# Patient Record
Sex: Male | Born: 1945
Health system: Southern US, Community
[De-identification: ages and names within clinical notes are randomized; demographics above are authoritative.]

## PROBLEM LIST (undated history)

## (undated) DIAGNOSIS — G4733 Obstructive sleep apnea (adult) (pediatric): Secondary | ICD-10-CM

## (undated) DIAGNOSIS — C801 Malignant (primary) neoplasm, unspecified: Secondary | ICD-10-CM

## (undated) DIAGNOSIS — K605 Anorectal fistula, unspecified: Secondary | ICD-10-CM

## (undated) DIAGNOSIS — M1990 Unspecified osteoarthritis, unspecified site: Secondary | ICD-10-CM

## (undated) DIAGNOSIS — Z5189 Encounter for other specified aftercare: Secondary | ICD-10-CM

## (undated) DIAGNOSIS — Z8572 Personal history of non-Hodgkin lymphomas: Secondary | ICD-10-CM

## (undated) DIAGNOSIS — Z8579 Personal history of other malignant neoplasms of lymphoid, hematopoietic and related tissues: Secondary | ICD-10-CM

## (undated) DIAGNOSIS — Z86711 Personal history of pulmonary embolism: Secondary | ICD-10-CM

## (undated) DIAGNOSIS — I8393 Asymptomatic varicose veins of bilateral lower extremities: Secondary | ICD-10-CM

## (undated) DIAGNOSIS — L0390 Cellulitis, unspecified: Secondary | ICD-10-CM

## (undated) DIAGNOSIS — Z9989 Dependence on other enabling machines and devices: Secondary | ICD-10-CM

## (undated) DIAGNOSIS — H269 Unspecified cataract: Secondary | ICD-10-CM

## (undated) DIAGNOSIS — K432 Incisional hernia without obstruction or gangrene: Secondary | ICD-10-CM

## (undated) DIAGNOSIS — I82409 Acute embolism and thrombosis of unspecified deep veins of unspecified lower extremity: Secondary | ICD-10-CM

## (undated) HISTORY — DX: Morbid (severe) obesity due to excess calories: E66.01

## (undated) HISTORY — DX: Personal history of non-Hodgkin lymphomas: Z85.72

## (undated) HISTORY — PX: PROSTATE SURGERY: SHX751

## (undated) HISTORY — DX: Incisional hernia without obstruction or gangrene: K43.2

## (undated) HISTORY — PX: TONSILLECTOMY: SUR1361

## (undated) HISTORY — DX: Asymptomatic varicose veins of bilateral lower extremities: I83.93

## (undated) HISTORY — DX: Obstructive sleep apnea (adult) (pediatric): G47.33

## (undated) HISTORY — DX: Dependence on other enabling machines and devices: Z99.89

## (undated) HISTORY — DX: Unspecified osteoarthritis, unspecified site: M19.90

## (undated) HISTORY — DX: Unspecified cataract: H26.9

## (undated) HISTORY — PX: FRACTURE SURGERY: SHX138

## (undated) HISTORY — DX: Encounter for other specified aftercare: Z51.89

## (undated) HISTORY — DX: Personal history of other malignant neoplasms of lymphoid, hematopoietic and related tissues: Z85.79

## (undated) HISTORY — DX: Personal history of pulmonary embolism: Z86.711

## (undated) HISTORY — PX: SPLENECTOMY: SUR1306

## (undated) HISTORY — PX: HERNIA REPAIR: SHX51

## (undated) HISTORY — PX: OTHER SURGICAL HISTORY: SHX169

---

## 1997-10-05 ENCOUNTER — Ambulatory Visit: Admission: RE | Admit: 1997-10-05 | Discharge: 1997-10-05 | Payer: Self-pay | Admitting: Emergency Medicine

## 1998-02-01 ENCOUNTER — Ambulatory Visit: Admission: RE | Admit: 1998-02-01 | Discharge: 1998-02-01 | Payer: Self-pay | Admitting: Internal Medicine

## 1999-02-20 ENCOUNTER — Encounter: Payer: Self-pay | Admitting: Cardiovascular Disease

## 1999-02-20 ENCOUNTER — Inpatient Hospital Stay (HOSPITAL_COMMUNITY): Admission: AD | Admit: 1999-02-20 | Discharge: 1999-02-27 | Payer: Self-pay | Admitting: Cardiovascular Disease

## 1999-02-21 ENCOUNTER — Encounter: Payer: Self-pay | Admitting: *Deleted

## 1999-02-21 ENCOUNTER — Encounter: Payer: Self-pay | Admitting: Cardiovascular Disease

## 1999-03-29 ENCOUNTER — Encounter: Payer: Self-pay | Admitting: Cardiovascular Disease

## 1999-03-29 ENCOUNTER — Ambulatory Visit (HOSPITAL_COMMUNITY): Admission: RE | Admit: 1999-03-29 | Discharge: 1999-03-29 | Payer: Self-pay | Admitting: Cardiovascular Disease

## 2000-10-02 ENCOUNTER — Encounter: Admission: RE | Admit: 2000-10-02 | Discharge: 2000-12-31 | Payer: Self-pay | Admitting: Emergency Medicine

## 2000-12-02 ENCOUNTER — Encounter: Payer: Self-pay | Admitting: Emergency Medicine

## 2000-12-02 ENCOUNTER — Observation Stay (HOSPITAL_COMMUNITY): Admission: AC | Admit: 2000-12-02 | Discharge: 2000-12-03 | Payer: Self-pay

## 2001-02-18 ENCOUNTER — Inpatient Hospital Stay (HOSPITAL_COMMUNITY): Admission: EM | Admit: 2001-02-18 | Discharge: 2001-02-25 | Payer: Self-pay | Admitting: Emergency Medicine

## 2001-02-18 ENCOUNTER — Encounter: Payer: Self-pay | Admitting: Emergency Medicine

## 2001-05-05 HISTORY — PX: EYE SURGERY: SHX253

## 2001-11-02 ENCOUNTER — Encounter: Payer: Self-pay | Admitting: Ophthalmology

## 2001-11-02 ENCOUNTER — Observation Stay (HOSPITAL_COMMUNITY): Admission: AD | Admit: 2001-11-02 | Discharge: 2001-11-04 | Payer: Self-pay | Admitting: Ophthalmology

## 2002-05-31 ENCOUNTER — Ambulatory Visit (HOSPITAL_COMMUNITY): Admission: RE | Admit: 2002-05-31 | Discharge: 2002-05-31 | Payer: Self-pay | Admitting: Ophthalmology

## 2002-07-22 ENCOUNTER — Encounter (INDEPENDENT_AMBULATORY_CARE_PROVIDER_SITE_OTHER): Payer: Self-pay | Admitting: *Deleted

## 2002-07-22 ENCOUNTER — Ambulatory Visit (HOSPITAL_COMMUNITY): Admission: RE | Admit: 2002-07-22 | Discharge: 2002-07-22 | Payer: Self-pay | Admitting: *Deleted

## 2004-07-16 ENCOUNTER — Ambulatory Visit: Payer: Self-pay | Admitting: Internal Medicine

## 2004-08-22 ENCOUNTER — Encounter: Admission: RE | Admit: 2004-08-22 | Discharge: 2004-08-22 | Payer: Self-pay | Admitting: General Surgery

## 2004-10-25 ENCOUNTER — Encounter (INDEPENDENT_AMBULATORY_CARE_PROVIDER_SITE_OTHER): Payer: Self-pay | Admitting: Specialist

## 2004-10-25 ENCOUNTER — Ambulatory Visit (HOSPITAL_COMMUNITY): Admission: RE | Admit: 2004-10-25 | Discharge: 2004-10-25 | Payer: Self-pay | Admitting: General Surgery

## 2006-06-20 ENCOUNTER — Emergency Department (HOSPITAL_COMMUNITY): Admission: EM | Admit: 2006-06-20 | Discharge: 2006-06-21 | Payer: Self-pay | Admitting: Emergency Medicine

## 2007-01-25 ENCOUNTER — Encounter: Payer: Self-pay | Admitting: Internal Medicine

## 2007-01-25 LAB — CONVERTED CEMR LAB
INR: 2.4 — ABNORMAL HIGH (ref 0.0–1.5)
Prothrombin Time: 27.3 s — ABNORMAL HIGH (ref 11.6–15.2)

## 2007-03-26 ENCOUNTER — Ambulatory Visit: Payer: Self-pay | Admitting: Internal Medicine

## 2007-03-29 ENCOUNTER — Ambulatory Visit: Payer: Self-pay | Admitting: Internal Medicine

## 2007-03-29 LAB — CONVERTED CEMR LAB
INR: 3.3 — ABNORMAL HIGH (ref 0.8–1.0)
Prothrombin Time: 22.9 s — ABNORMAL HIGH (ref 10.9–13.3)

## 2007-04-26 ENCOUNTER — Ambulatory Visit: Payer: Self-pay | Admitting: Cardiology

## 2007-05-12 ENCOUNTER — Ambulatory Visit: Payer: Self-pay | Admitting: Cardiovascular Disease

## 2007-07-07 ENCOUNTER — Ambulatory Visit: Payer: Self-pay | Admitting: Internal Medicine

## 2007-08-04 ENCOUNTER — Ambulatory Visit: Payer: Self-pay | Admitting: Cardiology

## 2007-08-17 ENCOUNTER — Ambulatory Visit: Payer: Self-pay | Admitting: Cardiology

## 2007-12-28 ENCOUNTER — Ambulatory Visit: Payer: Self-pay | Admitting: Cardiology

## 2008-01-18 ENCOUNTER — Ambulatory Visit: Payer: Self-pay | Admitting: Cardiology

## 2008-02-08 ENCOUNTER — Encounter: Payer: Self-pay | Admitting: Internal Medicine

## 2008-02-15 ENCOUNTER — Ambulatory Visit: Payer: Self-pay | Admitting: Cardiovascular Disease

## 2008-03-14 ENCOUNTER — Ambulatory Visit: Payer: Self-pay | Admitting: Cardiology

## 2008-04-11 ENCOUNTER — Ambulatory Visit: Payer: Self-pay | Admitting: Internal Medicine

## 2008-04-19 DIAGNOSIS — Z86718 Personal history of other venous thrombosis and embolism: Secondary | ICD-10-CM | POA: Insufficient documentation

## 2008-04-19 DIAGNOSIS — G4733 Obstructive sleep apnea (adult) (pediatric): Secondary | ICD-10-CM | POA: Insufficient documentation

## 2008-04-19 DIAGNOSIS — I8390 Asymptomatic varicose veins of unspecified lower extremity: Secondary | ICD-10-CM | POA: Insufficient documentation

## 2008-04-20 ENCOUNTER — Ambulatory Visit: Payer: Self-pay | Admitting: Internal Medicine

## 2008-05-09 ENCOUNTER — Ambulatory Visit: Payer: Self-pay | Admitting: Cardiology

## 2008-06-06 ENCOUNTER — Ambulatory Visit: Payer: Self-pay | Admitting: Cardiology

## 2008-06-28 ENCOUNTER — Ambulatory Visit: Payer: Self-pay | Admitting: Cardiology

## 2008-07-27 ENCOUNTER — Ambulatory Visit: Payer: Self-pay | Admitting: Internal Medicine

## 2008-08-30 ENCOUNTER — Telehealth (INDEPENDENT_AMBULATORY_CARE_PROVIDER_SITE_OTHER): Payer: Self-pay | Admitting: Cardiology

## 2008-09-15 ENCOUNTER — Ambulatory Visit: Payer: Self-pay | Admitting: Cardiology

## 2008-10-03 ENCOUNTER — Encounter: Payer: Self-pay | Admitting: *Deleted

## 2008-10-03 ENCOUNTER — Ambulatory Visit: Payer: Self-pay | Admitting: Cardiovascular Disease

## 2008-10-03 LAB — CONVERTED CEMR LAB
POC INR: 1.3
Protime: 14.2

## 2008-10-23 ENCOUNTER — Ambulatory Visit: Payer: Self-pay | Admitting: Internal Medicine

## 2008-10-23 ENCOUNTER — Encounter (INDEPENDENT_AMBULATORY_CARE_PROVIDER_SITE_OTHER): Payer: Self-pay | Admitting: Cardiology

## 2008-10-23 LAB — CONVERTED CEMR LAB
POC INR: 1.9
Protime: 17

## 2008-11-08 ENCOUNTER — Encounter: Payer: Self-pay | Admitting: *Deleted

## 2008-11-13 ENCOUNTER — Encounter (INDEPENDENT_AMBULATORY_CARE_PROVIDER_SITE_OTHER): Payer: Self-pay | Admitting: Cardiology

## 2008-11-13 ENCOUNTER — Ambulatory Visit: Payer: Self-pay | Admitting: Cardiovascular Disease

## 2008-11-13 LAB — CONVERTED CEMR LAB
POC INR: 2.1
Prothrombin Time: 17.9 s

## 2009-01-01 ENCOUNTER — Encounter (INDEPENDENT_AMBULATORY_CARE_PROVIDER_SITE_OTHER): Payer: Self-pay | Admitting: Cardiology

## 2009-01-19 ENCOUNTER — Encounter (INDEPENDENT_AMBULATORY_CARE_PROVIDER_SITE_OTHER): Payer: Self-pay | Admitting: *Deleted

## 2009-02-07 ENCOUNTER — Ambulatory Visit: Payer: Self-pay | Admitting: Cardiology

## 2009-02-07 LAB — CONVERTED CEMR LAB: POC INR: 2.9

## 2009-03-07 ENCOUNTER — Ambulatory Visit: Payer: Self-pay | Admitting: Cardiovascular Disease

## 2009-03-07 LAB — CONVERTED CEMR LAB: POC INR: 3.8

## 2009-03-20 ENCOUNTER — Encounter: Payer: Self-pay | Admitting: Internal Medicine

## 2009-03-23 ENCOUNTER — Ambulatory Visit: Payer: Self-pay | Admitting: Cardiology

## 2009-03-23 LAB — CONVERTED CEMR LAB: POC INR: 3.8

## 2009-04-04 ENCOUNTER — Ambulatory Visit: Payer: Self-pay | Admitting: Cardiology

## 2009-04-04 LAB — CONVERTED CEMR LAB: POC INR: 2.4

## 2009-04-16 ENCOUNTER — Ambulatory Visit: Payer: Self-pay | Admitting: Internal Medicine

## 2009-04-25 ENCOUNTER — Ambulatory Visit: Payer: Self-pay | Admitting: Cardiology

## 2009-04-25 LAB — CONVERTED CEMR LAB: POC INR: 2.4

## 2009-05-14 ENCOUNTER — Telehealth: Payer: Self-pay | Admitting: Internal Medicine

## 2009-05-23 ENCOUNTER — Ambulatory Visit: Payer: Self-pay | Admitting: Cardiology

## 2009-05-23 LAB — CONVERTED CEMR LAB: POC INR: 2.7

## 2009-08-12 ENCOUNTER — Emergency Department (HOSPITAL_COMMUNITY): Admission: EM | Admit: 2009-08-12 | Discharge: 2009-08-12 | Payer: Self-pay | Admitting: Emergency Medicine

## 2009-09-11 ENCOUNTER — Encounter: Admission: RE | Admit: 2009-09-11 | Discharge: 2009-09-11 | Payer: Self-pay | Admitting: Emergency Medicine

## 2009-09-11 ENCOUNTER — Inpatient Hospital Stay (HOSPITAL_COMMUNITY): Admission: EM | Admit: 2009-09-11 | Discharge: 2009-09-13 | Payer: Self-pay | Admitting: Emergency Medicine

## 2009-09-21 ENCOUNTER — Encounter: Payer: Self-pay | Admitting: Cardiology

## 2009-12-07 ENCOUNTER — Encounter: Admission: RE | Admit: 2009-12-07 | Discharge: 2009-12-07 | Payer: Self-pay | Admitting: Emergency Medicine

## 2009-12-12 ENCOUNTER — Telehealth: Payer: Self-pay | Admitting: Internal Medicine

## 2009-12-26 ENCOUNTER — Inpatient Hospital Stay (HOSPITAL_COMMUNITY): Admission: AD | Admit: 2009-12-26 | Discharge: 2010-01-01 | Payer: Self-pay

## 2009-12-26 ENCOUNTER — Encounter: Admission: RE | Admit: 2009-12-26 | Discharge: 2009-12-26 | Payer: Self-pay | Admitting: Emergency Medicine

## 2009-12-28 ENCOUNTER — Encounter (INDEPENDENT_AMBULATORY_CARE_PROVIDER_SITE_OTHER): Payer: Self-pay | Admitting: Surgery

## 2010-01-04 ENCOUNTER — Ambulatory Visit: Payer: Self-pay | Admitting: Cardiology

## 2010-01-04 ENCOUNTER — Ambulatory Visit: Payer: Self-pay | Admitting: Oncology

## 2010-01-04 LAB — CONVERTED CEMR LAB: POC INR: 2

## 2010-01-08 LAB — CBC WITH DIFFERENTIAL/PLATELET
BASO%: 0.3 % (ref 0.0–2.0)
Basophils Absolute: 0 10*3/uL (ref 0.0–0.1)
EOS%: 4.4 % (ref 0.0–7.0)
Eosinophils Absolute: 0.5 10*3/uL (ref 0.0–0.5)
HCT: 35.2 % — ABNORMAL LOW (ref 38.4–49.9)
HGB: 11.6 g/dL — ABNORMAL LOW (ref 13.0–17.1)
LYMPH%: 7.9 % — ABNORMAL LOW (ref 14.0–49.0)
MCH: 27.3 pg (ref 27.2–33.4)
MCHC: 33 g/dL (ref 32.0–36.0)
MCV: 82.7 fL (ref 79.3–98.0)
MONO#: 1.1 10*3/uL — ABNORMAL HIGH (ref 0.1–0.9)
MONO%: 11 % (ref 0.0–14.0)
NEUT#: 7.9 10*3/uL — ABNORMAL HIGH (ref 1.5–6.5)
NEUT%: 76.4 % — ABNORMAL HIGH (ref 39.0–75.0)
Platelets: 778 10*3/uL — ABNORMAL HIGH (ref 140–400)
RBC: 4.26 10*6/uL (ref 4.20–5.82)
RDW: 16.6 % — ABNORMAL HIGH (ref 11.0–14.6)
WBC: 10.3 10*3/uL (ref 4.0–10.3)
lymph#: 0.8 10*3/uL — ABNORMAL LOW (ref 0.9–3.3)

## 2010-01-08 LAB — COMPREHENSIVE METABOLIC PANEL
ALT: 18 U/L (ref 0–53)
AST: 19 U/L (ref 0–37)
Albumin: 3.6 g/dL (ref 3.5–5.2)
Alkaline Phosphatase: 80 U/L (ref 39–117)
BUN: 11 mg/dL (ref 6–23)
CO2: 28 mEq/L (ref 19–32)
Calcium: 8.9 mg/dL (ref 8.4–10.5)
Chloride: 101 mEq/L (ref 96–112)
Creatinine, Ser: 0.64 mg/dL (ref 0.40–1.50)
Glucose, Bld: 86 mg/dL (ref 70–99)
Potassium: 4.1 mEq/L (ref 3.5–5.3)
Sodium: 136 mEq/L (ref 135–145)
Total Bilirubin: 0.5 mg/dL (ref 0.3–1.2)
Total Protein: 7 g/dL (ref 6.0–8.3)

## 2010-01-08 LAB — MORPHOLOGY
PLT EST: INCREASED
White Cell Comments: 1

## 2010-01-08 LAB — LACTATE DEHYDROGENASE: LDH: 136 U/L (ref 94–250)

## 2010-01-08 LAB — URIC ACID: Uric Acid, Serum: 4.9 mg/dL (ref 4.0–7.8)

## 2010-01-08 LAB — CHCC SMEAR

## 2010-01-10 LAB — HEPATITIS B SURFACE ANTIGEN: Hepatitis B Surface Ag: NEGATIVE

## 2010-01-10 LAB — IMMUNOFIXATION ELECTROPHORESIS
IgA: 151 mg/dL (ref 68–378)
IgG (Immunoglobin G), Serum: 801 mg/dL (ref 694–1618)
IgM, Serum: 112 mg/dL (ref 60–263)
Total Protein, Serum Electrophoresis: 6.7 g/dL (ref 6.0–8.3)

## 2010-01-10 LAB — HEPATITIS C ANTIBODY: HCV Ab: NEGATIVE

## 2010-01-10 LAB — BETA 2 MICROGLOBULIN, SERUM: Beta-2 Microglobulin: 1.91 mg/L — ABNORMAL HIGH (ref 1.01–1.73)

## 2010-01-10 LAB — HIV ANTIBODY (ROUTINE TESTING W REFLEX)

## 2010-01-10 LAB — HEPATITIS B CORE ANTIBODY, TOTAL: Hep B Core Total Ab: NEGATIVE

## 2010-01-11 ENCOUNTER — Telehealth: Payer: Self-pay | Admitting: Internal Medicine

## 2010-01-11 ENCOUNTER — Ambulatory Visit: Payer: Self-pay | Admitting: Internal Medicine

## 2010-01-11 LAB — CONVERTED CEMR LAB: POC INR: 2.8

## 2010-01-14 ENCOUNTER — Ambulatory Visit (HOSPITAL_COMMUNITY): Admission: RE | Admit: 2010-01-14 | Discharge: 2010-01-14 | Payer: Self-pay | Admitting: Oncology

## 2010-01-17 ENCOUNTER — Ambulatory Visit (HOSPITAL_COMMUNITY)
Admission: RE | Admit: 2010-01-17 | Discharge: 2010-01-17 | Payer: Self-pay | Source: Home / Self Care | Admitting: Oncology

## 2010-01-18 LAB — CBC WITH DIFFERENTIAL/PLATELET
BASO%: 0.2 % (ref 0.0–2.0)
Basophils Absolute: 0 10*3/uL (ref 0.0–0.1)
EOS%: 7 % (ref 0.0–7.0)
Eosinophils Absolute: 0.6 10*3/uL — ABNORMAL HIGH (ref 0.0–0.5)
HCT: 36.2 % — ABNORMAL LOW (ref 38.4–49.9)
HGB: 11.9 g/dL — ABNORMAL LOW (ref 13.0–17.1)
LYMPH%: 10 % — ABNORMAL LOW (ref 14.0–49.0)
MCH: 27.3 pg (ref 27.2–33.4)
MCHC: 32.8 g/dL (ref 32.0–36.0)
MCV: 83.2 fL (ref 79.3–98.0)
MONO#: 0.8 10*3/uL (ref 0.1–0.9)
MONO%: 10.3 % (ref 0.0–14.0)
NEUT#: 5.8 10*3/uL (ref 1.5–6.5)
NEUT%: 72.5 % (ref 39.0–75.0)
Platelets: 525 10*3/uL — ABNORMAL HIGH (ref 140–400)
RBC: 4.35 10*6/uL (ref 4.20–5.82)
RDW: 16.9 % — ABNORMAL HIGH (ref 11.0–14.6)
WBC: 8 10*3/uL (ref 4.0–10.3)
lymph#: 0.8 10*3/uL — ABNORMAL LOW (ref 0.9–3.3)

## 2010-01-24 ENCOUNTER — Ambulatory Visit (HOSPITAL_COMMUNITY): Admission: RE | Admit: 2010-01-24 | Discharge: 2010-01-24 | Payer: Self-pay | Admitting: Oncology

## 2010-01-29 ENCOUNTER — Ambulatory Visit: Payer: Self-pay | Admitting: Cardiovascular Disease

## 2010-01-29 LAB — CONVERTED CEMR LAB: POC INR: 2.1

## 2010-02-28 ENCOUNTER — Ambulatory Visit: Payer: Self-pay | Admitting: Cardiology

## 2010-02-28 LAB — CONVERTED CEMR LAB: POC INR: 1.7

## 2010-03-01 ENCOUNTER — Ambulatory Visit: Payer: Self-pay | Admitting: Oncology

## 2010-03-05 ENCOUNTER — Ambulatory Visit (HOSPITAL_COMMUNITY): Admission: RE | Admit: 2010-03-05 | Discharge: 2010-03-05 | Payer: Self-pay | Admitting: Oncology

## 2010-03-05 LAB — CBC WITH DIFFERENTIAL/PLATELET
BASO%: 1.4 % (ref 0.0–2.0)
Basophils Absolute: 0.1 10*3/uL (ref 0.0–0.1)
EOS%: 9.1 % — ABNORMAL HIGH (ref 0.0–7.0)
Eosinophils Absolute: 0.6 10*3/uL — ABNORMAL HIGH (ref 0.0–0.5)
HCT: 40.6 % (ref 38.4–49.9)
HGB: 13 g/dL (ref 13.0–17.1)
LYMPH%: 13.9 % — ABNORMAL LOW (ref 14.0–49.0)
MCH: 26.2 pg — ABNORMAL LOW (ref 27.2–33.4)
MCHC: 32 g/dL (ref 32.0–36.0)
MCV: 81.9 fL (ref 79.3–98.0)
MONO#: 0.9 10*3/uL (ref 0.1–0.9)
MONO%: 13.7 % (ref 0.0–14.0)
NEUT#: 3.9 10*3/uL (ref 1.5–6.5)
NEUT%: 61.9 % (ref 39.0–75.0)
Platelets: 409 10*3/uL — ABNORMAL HIGH (ref 140–400)
RBC: 4.96 10*6/uL (ref 4.20–5.82)
RDW: 16.4 % — ABNORMAL HIGH (ref 11.0–14.6)
WBC: 6.3 10*3/uL (ref 4.0–10.3)
lymph#: 0.9 10*3/uL (ref 0.9–3.3)
nRBC: 0 % (ref 0–0)

## 2010-03-05 LAB — MORPHOLOGY: PLT EST: ADEQUATE

## 2010-03-05 LAB — CHCC SMEAR

## 2010-03-25 ENCOUNTER — Ambulatory Visit: Payer: Self-pay | Admitting: Cardiovascular Disease

## 2010-03-25 LAB — CONVERTED CEMR LAB: POC INR: 1.7

## 2010-04-04 ENCOUNTER — Ambulatory Visit (HOSPITAL_BASED_OUTPATIENT_CLINIC_OR_DEPARTMENT_OTHER): Payer: BC Managed Care – PPO | Admitting: Oncology

## 2010-04-08 LAB — CBC WITH DIFFERENTIAL/PLATELET
BASO%: 0.7 % (ref 0.0–2.0)
Basophils Absolute: 0.1 10*3/uL (ref 0.0–0.1)
EOS%: 5 % (ref 0.0–7.0)
Eosinophils Absolute: 0.4 10*3/uL (ref 0.0–0.5)
HCT: 40.1 % (ref 38.4–49.9)
HGB: 13.2 g/dL (ref 13.0–17.1)
LYMPH%: 12.9 % — ABNORMAL LOW (ref 14.0–49.0)
MCH: 26.3 pg — ABNORMAL LOW (ref 27.2–33.4)
MCHC: 32.9 g/dL (ref 32.0–36.0)
MCV: 80 fL (ref 79.3–98.0)
MONO#: 0.9 10*3/uL (ref 0.1–0.9)
MONO%: 11.2 % (ref 0.0–14.0)
NEUT#: 5.4 10*3/uL (ref 1.5–6.5)
NEUT%: 70.2 % (ref 39.0–75.0)
Platelets: 420 10*3/uL — ABNORMAL HIGH (ref 140–400)
RBC: 5.01 10*6/uL (ref 4.20–5.82)
RDW: 17.4 % — ABNORMAL HIGH (ref 11.0–14.6)
WBC: 7.7 10*3/uL (ref 4.0–10.3)
lymph#: 1 10*3/uL (ref 0.9–3.3)

## 2010-04-09 LAB — COMPREHENSIVE METABOLIC PANEL
ALT: 17 U/L (ref 0–53)
AST: 19 U/L (ref 0–37)
Albumin: 4.3 g/dL (ref 3.5–5.2)
Alkaline Phosphatase: 76 U/L (ref 39–117)
BUN: 13 mg/dL (ref 6–23)
CO2: 30 mEq/L (ref 19–32)
Calcium: 9.2 mg/dL (ref 8.4–10.5)
Chloride: 103 mEq/L (ref 96–112)
Creatinine, Ser: 0.72 mg/dL (ref 0.40–1.50)
Glucose, Bld: 104 mg/dL — ABNORMAL HIGH (ref 70–99)
Potassium: 4.5 mEq/L (ref 3.5–5.3)
Sodium: 140 mEq/L (ref 135–145)
Total Bilirubin: 0.3 mg/dL (ref 0.3–1.2)
Total Protein: 6.4 g/dL (ref 6.0–8.3)

## 2010-04-09 LAB — LACTATE DEHYDROGENASE: LDH: 98 U/L (ref 94–250)

## 2010-04-09 LAB — URIC ACID: Uric Acid, Serum: 5.4 mg/dL (ref 4.0–7.8)

## 2010-04-09 LAB — SEDIMENTATION RATE: Sed Rate: 1 mm/hr (ref 0–16)

## 2010-04-09 LAB — BETA 2 MICROGLOBULIN, SERUM: Beta-2 Microglobulin: 1.57 mg/L (ref 1.01–1.73)

## 2010-04-15 ENCOUNTER — Ambulatory Visit: Payer: Self-pay | Admitting: Cardiology

## 2010-04-15 LAB — CONVERTED CEMR LAB: POC INR: 2.1

## 2010-04-16 ENCOUNTER — Ambulatory Visit: Payer: Self-pay | Admitting: Internal Medicine

## 2010-04-16 DIAGNOSIS — C8307 Small cell B-cell lymphoma, spleen: Secondary | ICD-10-CM | POA: Insufficient documentation

## 2010-04-24 ENCOUNTER — Ambulatory Visit (HOSPITAL_COMMUNITY): Admission: RE | Admit: 2010-04-24 | Payer: Self-pay | Source: Home / Self Care | Admitting: Oncology

## 2010-05-13 ENCOUNTER — Ambulatory Visit: Admission: RE | Admit: 2010-05-13 | Discharge: 2010-05-13 | Payer: Self-pay | Source: Home / Self Care

## 2010-05-13 LAB — CONVERTED CEMR LAB: POC INR: 2

## 2010-05-23 ENCOUNTER — Other Ambulatory Visit: Payer: Self-pay | Admitting: Oncology

## 2010-05-23 DIAGNOSIS — C8590 Non-Hodgkin lymphoma, unspecified, unspecified site: Secondary | ICD-10-CM

## 2010-05-26 ENCOUNTER — Encounter: Payer: Self-pay | Admitting: Oncology

## 2010-06-04 NOTE — Letter (Signed)
Summary: [facility]  [facility]   Imported By: Esmeralda Links D'jimraou 02/11/2008 11:58:24  _____________________________________________________________________  External Attachment:    Type:   Image     Comment:   External Document

## 2010-06-04 NOTE — Letter (Signed)
Summary: [facility]  Coumadin  [address]   [place], [place] [address]   Phone: [phone]  Fax: [phone]     January 01, 2009 MRN: [id number]   KENDRYCK LACROIX [address] [place], [place]  [address]   Dear Mr. Lambert,  This letter is being sent to you as a reminder that it is necessary for you to get your INR/PT checked regularly so that we can optimize your care.  Our records indicate that you were scheduled to have a test done recently.  As of today, we have not received the results of this test.  It is very important that you have your INR checked.  Please call our office at the number listed above to schedule an appointment at your earliest convenience.    If you have recently had your protime checked or have discontinued this medication, please contact our office at the above phone number to clarify this issue.  Thank you for this prompt attention to this important health care matter.  Sincerely,   [facility] Team

## 2010-06-04 NOTE — Letter (Signed)
Summary: Handout Printed  Printed Handout:  - Coumadin Instructions 

## 2010-06-04 NOTE — Medication Information (Signed)
Summary: Coumadin Clinic   Anticoagulant Therapy  Managed by: Weston Brass, PharmD Referring MD: Dr Unknown PCP: Dr. Earl Lites Supervising MD: Jens Som MD, Arlys John Indication 1: Pulmonary Embolism and Infarction (ICD-415.1) Indication 2: DVT prophalaxis (ICD-zzz) Lab Used: [facility] [place] Site: [address] INR POC 1.7 INR RANGE 2 - 3  Dietary changes: no    Health status changes: no    Bleeding/hemorrhagic complications: no    Recent/future hospitalizations: no    Any changes in medication regimen? no    Recent/future dental: no  Any missed doses?: no       Is patient compliant with meds? yes       Allergies: No Known Drug Allergies  Anticoagulation Management History:      The patient is taking warfarin and comes in today for a routine follow up visit.  Negative risk factors for bleeding include an age less than 65 years old.  The bleeding index is 'low risk'.  Negative CHADS2 values include Age > 65 years old.  The start date was 12/31/2006.  His last INR was 3.3 RATIO.  Anticoagulation responsible provider: Jens Som MD, Arlys John.  INR POC: 1.7.  Cuvette Lot#: [id number].  Exp: 03/2011.    Anticoagulation Management Assessment/Plan:      The patient's current anticoagulation dose is Warfarin sodium 5 mg tabs: Use as directed by Anticoagulation Clinic.  The target INR is 2 - 3.  The next INR is due 03/25/2010.  Anticoagulation instructions were given to patient.  Results were reviewed/authorized by Weston Brass, PharmD.  He was notified by Ilean Skill D candidate.         Prior Anticoagulation Instructions: INR 2.1  Continue taking one tablet every day except for one-half tablet on Monday and Friday.  Recheck in four weeks.  Current Anticoagulation Instructions: INR 1.7  Take an extra 1/2 tablet today, then continue same dose of 1 tablet everyday except 1/2 tablet on Monday and Friday. Recheck in 4 weeks.
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
## 2010-06-04 NOTE — Medication Information (Signed)
Summary: rov/td  Anticoagulant Therapy  Managed by: Bethena Midget, RN, BSN Referring MD: Dr Unknown PCP: Dr. Earl Lites Supervising MD: Myrtis Ser MD, Tinnie Gens Indication 1: Pulmonary Embolism and Infarction (ICD-415.1) Indication 2: DVT prophalaxis (ICD-zzz) Lab Used: [facility] [place] Site: [address] INR POC 3.8 INR RANGE 2 - 3  Dietary changes: no    Health status changes: no    Bleeding/hemorrhagic complications: no    Recent/future hospitalizations: no    Any changes in medication regimen? no    Recent/future dental: no  Any missed doses?: no       Is patient compliant with meds? yes       Allergies (verified): No Known Drug Allergies  Anticoagulation Management History:      The patient is taking warfarin and comes in today for a routine follow up visit.  Negative risk factors for bleeding include an age less than 23 years old.  The bleeding index is 'low risk'.  Negative CHADS2 values include Age > 31 years old.  The start date was 12/31/2006.  His last INR was 3.3 RATIO.  Anticoagulation responsible provider: Myrtis Ser MD, Tinnie Gens.  INR POC: 3.8.  Cuvette Lot#: [id number].  Exp: 02/2010.    Anticoagulation Management Assessment/Plan:      The patient's current anticoagulation dose is Coumadin 5 mg tabs: Take as directed.  The target INR is 2 - 3.  The next INR is due 03/23/2009.  Anticoagulation instructions were given to patient.  Results were reviewed/authorized by Bethena Midget, RN, BSN.  He was notified by Bethena Midget, RN, BSN.         Prior Anticoagulation Instructions: INR 2.9 The patient is to continue with the same dose of coumadin.  Take 1 tablet (5 mg) every day.  Current Anticoagulation Instructions: INR 3.8 Skip today's dose then take 5mg s everyday except 2.5mg s on Sundays. Recheck in 2 weeks.

## 2010-06-04 NOTE — Medication Information (Signed)
Summary: ROV.MP  Anticoagulant Therapy  Managed by: Shelby Dubin, PharmD, BCPS, CPP PCP: Dr. Earl Lites Supervising MD: Eden Emms MD, Theron Arista PT 14.2  Dietary changes: no    Health status changes: no    Bleeding/hemorrhagic complications: no    Recent/future hospitalizations: yes       Details: restarted warfarin on 5/27 s/p colonoscopy  Any changes in medication regimen? no    Recent/future dental: no  Any missed doses?: yes     Details: associated with colonoscopy  Is patient compliant with meds? yes       Current Medications (verified): 1)  Coumadin 5 Mg Tabs (Warfarin Sodium) .... Take As Directed 2)  Celexa 10 Mg Tabs (Citalopram Hydrobromide) .... Take One Tablet Daily. 3)  Cpap  7 Cwp - [facility] .... For Use At Night  Allergies (verified): No Known Drug Allergies  Anticoagulation Management History:      The patient is on coumadin and comes in today for a routine follow up visit.  Negative risk factors for bleeding include an age less than 72 years old.  The bleeding index is 'low risk'.  Negative CHADS2 values include Age > 48 years old.  His last INR was 3.3 RATIO.    Anticoagulation Management Assessment/Plan:      The patient's current anticoagulation dose is Coumadin 5 mg tabs: Take as directed, Coumadin 5 mg tabs: Sunday - 1 tab, Monday - 1 tab, Tuesday - 0.5 tab, Wednesday - 1 tab, Thursday - 1 tab, Friday - 1 tab, Saturday - 1 tab.  He is to have a 10/23/2008.  Anticoagulation instructions were given to patient.  Results were reviewed/authorized by Shelby Dubin, PharmD, BCPS, CPP.  He was notified by Shelby Dubin PharmD, BCPS, CPP.         Current Anticoagulation Instructions: Take coumadin 7.5 mg for the next 3 days (6/1, 6/2, 6/3), then resume normal dosing as shown.    Coumadin 5 mg tabs: Sunday - 1 tab, Monday - 1 tab, Tuesday - 0.5 tab, Wednesday - 1 tab, Thursday - 1 tab, Friday - 1 tab, Saturday - 1 tab.

## 2010-06-04 NOTE — Progress Notes (Signed)
Summary: coumadin refill  Phone Note Call from Patient Call back at Home Phone [phone]   Caller: Patient Call For: Darrian Goodwill Summary of Call: pt states that his coumadin should be refilled by dr Vick Filter (due to PE).  Initial call taken by: Tivis Ringer,  May 14, 2009 3:49 PM  Follow-up for Phone Call        rx sent pt aware. Carron Curie CMA  May 14, 2009 4:04 PM     Prescriptions: COUMADIN 5 MG TABS (WARFARIN SODIUM) Take as directed  #30 x 0   Entered by:   Carron Curie CMA   Authorized by:   Waymon Budge MD   Signed by:   Carron Curie CMA on 05/14/2009   Method used:   [facility] to        [facility] [address]       [address]       [place], [place]  [address]       Ph: [phone] or [phone]       Fax: [phone]   RxID:   [id number]

## 2010-06-04 NOTE — Medication Information (Signed)
Summary: ccr   Anticoagulant Therapy  Managed by: Weston Brass, PharmD Referring MD: Dr Unknown PCP: Dr. Earl Lites Supervising MD: Eden Emms MD, Theron Arista Indication 1: Pulmonary Embolism and Infarction (ICD-415.1) Indication 2: DVT prophalaxis (ICD-zzz) Lab Used: [facility] [place] Site: [address] INR POC 2.1 INR RANGE 2 - 3  Dietary changes: no    Health status changes: no    Bleeding/hemorrhagic complications: no    Recent/future hospitalizations: no    Any changes in medication regimen? no    Recent/future dental: no  Any missed doses?: yes     Details: was taken off for three days for bone marrow biopsy, been back on coumadin 10 days  Is patient compliant with meds? yes       Allergies: No Known Drug Allergies  Anticoagulation Management History:      The patient is taking warfarin and comes in today for a routine follow up visit.  Negative risk factors for bleeding include an age less than 9 years old.  The bleeding index is 'low risk'.  Negative CHADS2 values include Age > 14 years old.  The start date was 12/31/2006.  His last INR was 3.3 RATIO.  Anticoagulation responsible provider: Eden Emms MD, Theron Arista.  INR POC: 2.1.  Cuvette Lot#: [id number].  Exp: 03/2011.    Anticoagulation Management Assessment/Plan:      The patient's current anticoagulation dose is Warfarin sodium 5 mg tabs: Use as directed by Anticoagulation Clinic.  The target INR is 2 - 3.  The next INR is due 02/26/2010.  Anticoagulation instructions were given to patient.  Results were reviewed/authorized by Weston Brass, PharmD.  He was notified by Kennieth Francois.         Prior Anticoagulation Instructions: INR 2.8  Continue taking 1 tablet everyday except take 1/2 tablet on Mondays and Fridays. F/U with Drs. Young/Daub about possible Lovenox bridge while off of Coumadin. Will re-check INR 1 week after procedure (01/24/10).   Current Anticoagulation Instructions: INR 2.1  Continue taking one  tablet every day except for one-half tablet on Monday and Friday.  Recheck in four weeks.

## 2010-06-04 NOTE — Medication Information (Signed)
Summary: ROVMP  Anticoagulant Therapy  Managed by: Shelby Dubin, PharmD, BCPS, CPP Referring MD: Dr Unknown PCP: Dr. Earl Lites Supervising MD: Myrtis Ser MD, Tinnie Gens Indication 1: Pulmonary Embolism and Infarction (ICD-415.1) Indication 2: DVT prophalaxis (ICD-zzz) Lab Used: [facility] [place] Site: [address] PT 17.9 INR POC 2.1 INR RANGE 2 - 3  Dietary changes: no    Health status changes: no    Bleeding/hemorrhagic complications: no    Recent/future hospitalizations: no    Any changes in medication regimen? no    Recent/future dental: no  Any missed doses?: no       Is patient compliant with meds? yes       Current Problems (verified): 1)  Obstructive Sleep Apnea  (ICD-327.23) 2)  Pulmonary Embolism, Hx of  (ICD-V12.51) 3)  Morbid Obesity  (ICD-278.01) 4)  Hx of Varicose Veins, Lower Extremities  (ICD-454.9)  Current Medications (verified): 1)  Coumadin 5 Mg Tabs (Warfarin Sodium) .... Take As Directed 2)  Celexa 20 Mg Tabs (Citalopram Hydrobromide) .Marland Kitchen.. 1 By Mouth Daily 3)  Cpap  7 Cwp - [facility] .... For Use At Night  Allergies (verified): No Known Drug Allergies  Anticoagulation Management History:      The patient is taking warfarin and comes in today for a routine follow up visit.  Negative risk factors for bleeding include an age less than 68 years old.  The bleeding index is 'low risk'.  Negative CHADS2 values include Age > 28 years old.  The start date was 12/31/2006.  His last INR was 3.3 RATIO.  Prothrombin time is 17.9.  Anticoagulation responsible provider: Myrtis Ser MD, Tinnie Gens.  INR POC: 2.1.  Cuvette Lot#: [id number] .  Exp: 11/2009.    Anticoagulation Management Assessment/Plan:      The patient's current anticoagulation dose is Coumadin 5 mg tabs: Take as directed.  The target INR is 2 - 3.  The next INR is due 12/11/2008.  Anticoagulation instructions were given to patient.  Results were reviewed/authorized by Shelby Dubin, PharmD, BCPS, CPP.  He was  notified by Shelby Dubin PharmD, BCPS, CPP.         Prior Anticoagulation Instructions: INR 1.9  Take 5 mg daily = 1 tablet  Current Anticoagulation Instructions: INR 2.1  Continue taking 5 mg ( 1 tab ) daily.

## 2010-06-04 NOTE — Medication Information (Signed)
Summary: rov/sel   Anticoagulant Therapy  Managed by: Reina Fuse, PharmD Referring MD: Dr Unknown PCP: Dr. Earl Lites Supervising MD: Eden Emms MD, Theron Arista Indication 1: Pulmonary Embolism and Infarction (ICD-415.1) Indication 2: DVT prophalaxis (ICD-zzz) Lab Used: [facility] [place] Site: [address] INR POC 1.7 INR RANGE 2 - 3  Dietary changes: no    Health status changes: no    Bleeding/hemorrhagic complications: no    Recent/future hospitalizations: no    Any changes in medication regimen? no    Recent/future dental: no  Any missed doses?: no       Is patient compliant with meds? yes       Allergies: No Known Drug Allergies  Anticoagulation Management History:      The patient is taking warfarin and comes in today for a routine follow up visit.  Negative risk factors for bleeding include an age less than 51 years old.  The bleeding index is 'low risk'.  Negative CHADS2 values include Age > 32 years old.  The start date was 12/31/2006.  His last INR was 3.3 RATIO.  Anticoagulation responsible provider: Eden Emms MD, Theron Arista.  INR POC: 1.7.  Cuvette Lot#: [id number].  Exp: 03/2011.    Anticoagulation Management Assessment/Plan:      The patient's current anticoagulation dose is Warfarin sodium 5 mg tabs: Use as directed by Anticoagulation Clinic.  The target INR is 2 - 3.  The next INR is due 04/15/2010.  Anticoagulation instructions were given to patient.  Results were reviewed/authorized by Reina Fuse, PharmD.  He was notified by Reina Fuse PharmD.         Prior Anticoagulation Instructions: INR 1.7  Take an extra 1/2 tablet today, then continue same dose of 1 tablet everyday except 1/2 tablet on Monday and Friday. Recheck in 4 weeks.   Current Anticoagulation Instructions: INR 1.7  Take Coumadin 1 tab (5 mg) on all days except for Coumadin 0.5 tab (2.5 mg) on Fridays. Return to clinic in 3 weeks.

## 2010-06-04 NOTE — Progress Notes (Signed)
Summary: Call Dr. [name]- splenic hematoma, IVC filter  Phone Note From Other Clinic Call back at [phone]   Caller: Dr. [name] Call For: Marc Mills Request: Talk with Provider Summary of Call: please call Dr. [name] concerning this mutual patient. Initial call taken by: Eugene Gavia,  December 12, 2009 9:22 AM  Follow-up for Phone Call        Per Dr [name]- patient fell, broke arm, had coumadin reversed and surgery ok. Recently found to have bled massively into spleen. Surgeons elected to follow and it is reabsorbing slowly. They put in an IVC filter and took him off coumadin. Dr [name] is sending him back to update me with an office visit. We don't know if filter is removable. Follow-up by: Waymon Budge MD,  December 12, 2009 12:44 PM

## 2010-06-04 NOTE — Medication Information (Signed)
Summary: rov/jm  Anticoagulant Therapy  Managed by: Eda Keys, PharmD Referring MD: Dr Unknown PCP: Dr. Earl Lites Supervising MD: Tenny Craw MD, Gunnar Fusi Indication 1: Pulmonary Embolism and Infarction (ICD-415.1) Indication 2: DVT prophalaxis (ICD-zzz) Lab Used: [facility] [place] Site: [address] INR POC 2.8 INR RANGE 2 - 3  Vital Signs: Weight: 292 lbs.     Dietary changes: no    Health status changes: no    Bleeding/hemorrhagic complications: no    Recent/future hospitalizations: no    Any changes in medication regimen? no    Recent/future dental: no  Any missed doses?: no       Is patient compliant with meds? yes      Comments: Pt is having bone marrow test next Thursday (01/17/10) and will need to be off Coumadin beginning Sunday (01/13/10). Dr. Maple Hudson is pulmonologist and Dr. [name] is PCP, will be making decision about Lovenox bridge.   Allergies: No Known Drug Allergies  Anticoagulation Management History:      The patient is taking warfarin and comes in today for a routine follow up visit.  Negative risk factors for bleeding include an age less than 63 years old.  The bleeding index is 'low risk'.  Negative CHADS2 values include Age > 77 years old.  The start date was 12/31/2006.  His last INR was 3.3 RATIO.  Anticoagulation responsible provider: Tenny Craw MD, Gunnar Fusi.  INR POC: 2.8.  Cuvette Lot#: [id number].  Exp: 03/2011.    Anticoagulation Management Assessment/Plan:      The patient's current anticoagulation dose is Warfarin sodium 5 mg tabs: Use as directed by Anticoagulation Clinic.  The target INR is 2 - 3.  The next INR is due 01/24/2010.  Anticoagulation instructions were given to patient.  Results were reviewed/authorized by Eda Keys, PharmD.  He was notified by Harrel Carina, PharmD candidate.         Prior Anticoagulation Instructions: INR 2.0  Take 1 tablet today, then one tablet every day except for one-half tablet on Monday and  Friday.  We will recheck your INR in 7 days.    Current Anticoagulation Instructions: INR 2.8  Continue taking 1 tablet everyday except take 1/2 tablet on Mondays and Fridays. F/U with Drs. Young/Daub about possible Lovenox bridge while off of Coumadin. Will re-check INR 1 week after procedure (01/24/10).
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
## 2010-06-04 NOTE — Medication Information (Signed)
Summary: Coumadin Clinic  Anticoagulant Therapy  Managed by: Inactive Referring MD: Dr Unknown PCP: Dr. Earl Lites Supervising MD: Myrtis Ser MD, Tinnie Gens Indication 1: Pulmonary Embolism and Infarction (ICD-415.1) Indication 2: DVT prophalaxis (ICD-zzz) Lab Used: [facility] [place] Site: [address] INR RANGE 2 - 3          Comments: Per hosp d/c note coumadin discontinued  Allergies: No Known Drug Allergies  Anticoagulation Management History:      Negative risk factors for bleeding include an age less than 74 years old.  The bleeding index is 'low risk'.  Negative CHADS2 values include Age > 77 years old.  The start date was 12/31/2006.  His last INR was 3.3 RATIO.  Anticoagulation responsible provider: Myrtis Ser MD, Tinnie Gens.  Exp: 08/2010.    Anticoagulation Management Assessment/Plan:      The patient's current anticoagulation dose is Warfarin sodium 5 mg tabs: Use as directed by Anticoagulation Clinic.  The target INR is 2 - 3.  The next INR is due 06/20/2009.  Anticoagulation instructions were given to patient.  Results were reviewed/authorized by Inactive.         Prior Anticoagulation Instructions: INR 2.7  Continue same dose of 1 tablet daily except 0.5 tablet on Mondays and Fridays. Recheck in 4 weeks.

## 2010-06-04 NOTE — Medication Information (Signed)
Summary: restarting Coumadin 5mg  daily/PE/DVT/INR 1.2 01/01/10  Anticoagulant Therapy  Managed by: Weston Brass, PharmD Referring MD: Dr Crissie Figures PCP: Dr. Earl Lites Supervising MD: Daleen Squibb MD, Maisie Fus Indication 1: Pulmonary Embolism and Infarction (ICD-415.1) Indication 2: DVT prophalaxis (ICD-zzz) Lab Used: [facility] [place] Site: [address] INR POC 2.0 INR RANGE 2 - 3  Dietary changes: no    Health status changes: no    Bleeding/hemorrhagic complications: no    Recent/future hospitalizations: yes       Details: recent splenectomy; currently on warfarin with lovenox bridge  Any changes in medication regimen? no    Recent/future dental: no  Any missed doses?: no       Is patient compliant with meds? yes       Current Medications (verified): 1)  Warfarin Sodium 5 Mg Tabs (Warfarin Sodium) .... Use As Directed By Anticoagulation Clinic 2)  Celexa 20 Mg Tabs (Citalopram Hydrobromide) .Marland Kitchen.. 1 By Mouth Daily 3)  Cpap  7 Cwp - [facility] .... For Use At Night  Allergies (verified): No Known Drug Allergies  Anticoagulation Management History:      Negative risk factors for bleeding include an age less than 1 years old.  The bleeding index is 'low risk'.  Negative CHADS2 values include Age > 23 years old.  The start date was 12/31/2006.  His last INR was 3.3 RATIO.  Anticoagulation responsible provider: Daleen Squibb MD, Maisie Fus.  INR POC: 2.0.  Cuvette Lot#: [id number].  Exp: 02/2011.    Anticoagulation Management Assessment/Plan:      The patient's current anticoagulation dose is Warfarin sodium 5 mg tabs: Use as directed by Anticoagulation Clinic.  The target INR is 2 - 3.  The next INR is due 01/11/2010.  Anticoagulation instructions were given to patient.  Results were reviewed/authorized by Weston Brass, PharmD.  He was notified by Kennieth Francois.         Prior Anticoagulation Instructions: INR 2.7  Continue same dose of 1 tablet daily except 0.5 tablet on Mondays and Fridays.  Recheck in 4 weeks.  Current Anticoagulation Instructions: INR 2.0  Take 1 tablet today, then one tablet every day except for one-half tablet on Monday and Friday.  We will recheck your INR in 7 days.

## 2010-06-04 NOTE — Progress Notes (Signed)
Summary: Pt pending colonoscopy on 5/24--clearance information  Phone Note From Other Clinic   Caller: fax from [facility] [phone] Call For: C. Young Summary of Call: Fax received from [facility] (Dr. Lavonia Drafts).  Pt scheduled for screening colonoscopy on 09/25/08.  They request 5 days - 7 days off therapy (coumadin).  Is this ok?  Contact information is in caller field. Initial call taken by: Shelby Dubin PharmD, BCPS, CPP,  August 30, 2008 9:27 AM  Follow-up for Phone Call        OK to be off coumadin for colonoscopy. Try to avoid stasis, prolonged sitting. keep legs elevated when possible during this time off coumadin. Follow-up by: Waymon Budge MD,  August 31, 2008 9:29 AM  Additional Follow-up for Phone Call Additional follow up Details #1::        Clearance form completed and faxed to [facility] (fax:  [phone]) from [place] office.  Receipt confirmed.  mep Additional Follow-up by: Shelby Dubin PharmD, BCPS, CPP,  Sep 05, 2008 4:07 PM

## 2010-06-04 NOTE — Letter (Signed)
Summary: LMN for CPAP Supplies/[facility]  LMN for CPAP Supplies/[facility]   Imported By: Sherian Rein 03/22/2009 12:21:19  _____________________________________________________________________  External Attachment:    Type:   Image     Comment:   External Document

## 2010-06-04 NOTE — Medication Information (Signed)
Summary: rov/sp  Anticoagulant Therapy  Managed by: Eda Keys, PharmD Referring MD: Dr Unknown PCP: Dr. Earl Lites Supervising MD: Myrtis Ser MD, Tinnie Gens Indication 1: Pulmonary Embolism and Infarction (ICD-415.1) Indication 2: DVT prophalaxis (ICD-zzz) Lab Used: [facility] [place] Site: [address] INR POC 2.9 INR RANGE 2 - 3  Dietary changes: no    Health status changes: no    Bleeding/hemorrhagic complications: no    Recent/future hospitalizations: no    Any changes in medication regimen? no    Recent/future dental: no  Any missed doses?: no       Is patient compliant with meds? yes       Allergies: No Known Drug Allergies  Anticoagulation Management History:      The patient is taking warfarin and comes in today for a routine follow up visit.  Negative risk factors for bleeding include an age less than 15 years old.  The bleeding index is 'low risk'.  Negative CHADS2 values include Age > 54 years old.  The start date was 12/31/2006.  His last INR was 3.3 RATIO.  Anticoagulation responsible provider: Myrtis Ser MD, Tinnie Gens.  INR POC: 2.9.  Cuvette Lot#: [id number].  Exp: 03/2010.    Anticoagulation Management Assessment/Plan:      The patient's current anticoagulation dose is Coumadin 5 mg tabs: Take as directed.  The target INR is 2 - 3.  The next INR is due 03/07/2009.  Anticoagulation instructions were given to patient.  Results were reviewed/authorized by Eda Keys, PharmD.  He was notified by Marcheta Grammes, PharmD candidate.         Prior Anticoagulation Instructions: INR 2.1  Continue taking 5 mg ( 1 tab ) daily.    Current Anticoagulation Instructions: INR 2.9 The patient is to continue with the same dose of coumadin.  Take 1 tablet (5 mg) every day.

## 2010-06-04 NOTE — Medication Information (Signed)
Summary: rov/sp  Anticoagulant Therapy  Managed by: Shelby Dubin, PharmD, BCPS, CPP Referring MD: Dr Crissie Figures PCP: Dr. Earl Lites Supervising MD: Tenny Craw MD, Gunnar Fusi Indication 1: Pulmonary Embolism and Infarction (ICD-415.1) Indication 2: DVT prophalaxis (ICD-zzz) Lab Used: [facility] 17.0 INR POC 1.9  Dietary changes: yes       Details: significant decrease in greens while traveling to [place] last week.  Health status changes: no    Bleeding/hemorrhagic complications: no    Recent/future hospitalizations: no    Any changes in medication regimen? no    Recent/future dental: no  Any missed doses?: no       Is patient compliant with meds? yes       Allergies (verified): No Known Drug Allergies  Anticoagulation Management History:      The patient is on coumadin and comes in today for a routine follow up visit.  Negative risk factors for bleeding include an age less than 33 years old.  The bleeding index is 'low risk'.  Negative CHADS2 values include Age > 13 years old.  The start date was 12/31/2006.  His last INR was 3.3 RATIO.    Anticoagulation Management Assessment/Plan:      The patient's current anticoagulation dose is Coumadin 5 mg tabs: Take as directed.  He is to have a 11/13/2008.  Anticoagulation instructions were given to patient.  Results were reviewed/authorized by Shelby Dubin, PharmD, BCPS, CPP.  He was notified by Shelby Dubin PharmD, BCPS, CPP.         Prior Anticoagulation Instructions: Take coumadin 7.5 mg for the next 3 days (6/1, 6/2, 6/3), then resume normal dosing as shown.    Coumadin 5 mg tabs: Sunday - 1 tab, Monday - 1 tab, Tuesday - 0.5 tab, Wednesday - 1 tab, Thursday - 1 tab, Friday - 1 tab, Saturday - 1 tab.    Current Anticoagulation Instructions: INR 1.9  Take 5 mg daily = 1 tablet

## 2010-06-04 NOTE — Medication Information (Signed)
Summary: rov/ewj  Anticoagulant Therapy  Managed by: Lew Dawes, PharmD Candidate Referring MD: Dr Unknown PCP: Dr. Earl Lites Supervising MD: Myrtis Ser MD, Tinnie Gens Indication 1: Pulmonary Embolism and Infarction (ICD-415.1) Indication 2: DVT prophalaxis (ICD-zzz) Lab Used: [facility] [place] Site: [address] INR POC 2.7 INR RANGE 2 - 3  Dietary changes: no    Health status changes: no    Bleeding/hemorrhagic complications: no    Recent/future hospitalizations: no    Any changes in medication regimen? yes       Details: Switched from Coumadin to warfarin.  Recent/future dental: no  Any missed doses?: no       Is patient compliant with meds? yes       Current Medications (verified): 1)  Warfarin Sodium 5 Mg Tabs (Warfarin Sodium) .... Use As Directed By Anticoagulation Clinic 2)  Celexa 20 Mg Tabs (Citalopram Hydrobromide) .Marland Kitchen.. 1 By Mouth Daily 3)  Cpap  7 Cwp - [facility] .... For Use At Night  Allergies (verified): No Known Drug Allergies  Anticoagulation Management History:      The patient is taking warfarin and comes in today for a routine follow up visit.  Negative risk factors for bleeding include an age less than 3 years old.  The bleeding index is 'low risk'.  Negative CHADS2 values include Age > 63 years old.  The start date was 12/31/2006.  His last INR was 3.3 RATIO.  Anticoagulation responsible provider: Myrtis Ser MD, Tinnie Gens.  INR POC: 2.7.  Cuvette Lot#: [id number].  Exp: 08/2010.    Anticoagulation Management Assessment/Plan:      The patient's current anticoagulation dose is Warfarin sodium 5 mg tabs: Use as directed by Anticoagulation Clinic.  The target INR is 2 - 3.  The next INR is due 06/20/2009.  Anticoagulation instructions were given to patient.  Results were reviewed/authorized by Lew Dawes, PharmD Candidate.  He was notified by Lew Dawes, Pharmd Candidate.         Prior Anticoagulation Instructions: INR 2.4 Continue 5mg s daily except   2.5mg s on Mondays and Fridays. Recheck in 4 weeks.   Current Anticoagulation Instructions: INR 2.7  Continue same dose of 1 tablet daily except 0.5 tablet on Mondays and Fridays. Recheck in 4 weeks. Prescriptions: WARFARIN SODIUM 5 MG TABS (WARFARIN SODIUM) Use as directed by Anticoagulation Clinic  #30 x 2   Entered by:   Shelby Dubin PharmD, BCPS, CPP   Authorized by:   Talitha Givens, MD, [facility]   Signed by:   Shelby Dubin PharmD, BCPS, CPP on 05/23/2009   Method used:   Electronically to        [facility] (retail)       [address]       [place], [place]  [address]       Ph: [phone] or [phone]       Fax: [phone]   RxID:   [id number] COUMADIN 5 MG TABS (WARFARIN SODIUM) Take as directed  #30 x 0   Entered by:   Shelby Dubin PharmD, BCPS, CPP   Authorized by:   Talitha Givens, MD, [facility]   Signed by:   Shelby Dubin PharmD, BCPS, CPP on 05/23/2009   Method used:   Electronically to        [facility]* (retail)       [address]       [place], [place]  [address]       Ph: [phone]  Fax: [phone]   RxID:   [id number]

## 2010-06-04 NOTE — Letter (Signed)
Summary: [facility] - Delinquent Coumadin 1  [facility], Main Office  [address]   [place], [place] [address]   Phone: [phone]  Fax: [phone]     January 19, 2009 MRN: [id number]   [facility] [address] [place], [place]  [address]   Dear [name],  This letter is being sent to you as a reminder that it is necessary for you to get your INR/PT checked regularly so that we can optimize your care.  Our records indicate that you were scheduled to have a test done recently.  As of today, we have not received the results of this test.  It is very important that you have your INR checked.  Please call our office at the number listed above to schedule an appointment at your earliest convenience.    If you have recently had your protime checked or have discontinued this medication, please contact our office at the above phone number to clarify this issue.  Thank you for this prompt attention to this important health care matter.  Sincerely,   [facility] Reduction [facility] Team

## 2010-06-04 NOTE — Medication Information (Signed)
Summary: rov/tm  Anticoagulant Therapy  Managed by: Bethena Midget, RN, BSN Referring MD: Dr Unknown PCP: Dr. Earl Lites Supervising MD: Jens Som MD, Arlys John Indication 1: Pulmonary Embolism and Infarction (ICD-415.1) Indication 2: DVT prophalaxis (ICD-zzz) Lab Used: [facility] [place] Site: [address] INR POC 2.4 INR RANGE 2 - 3  Dietary changes: no    Health status changes: no    Bleeding/hemorrhagic complications: no    Recent/future hospitalizations: no    Any changes in medication regimen? no    Recent/future dental: no  Any missed doses?: no       Is patient compliant with meds? yes       Allergies: No Known Drug Allergies  Anticoagulation Management History:      The patient is taking warfarin and comes in today for a routine follow up visit.  Negative risk factors for bleeding include an age less than 45 years old.  The bleeding index is 'low risk'.  Negative CHADS2 values include Age > 48 years old.  The start date was 12/31/2006.  His last INR was 3.3 RATIO.  Anticoagulation responsible provider: Jens Som MD, Arlys John.  INR POC: 2.4.  Cuvette Lot#: [id number].  Exp: 06/2010.    Anticoagulation Management Assessment/Plan:      The patient's current anticoagulation dose is Coumadin 5 mg tabs: Take as directed.  The target INR is 2 - 3.  The next INR is due 05/23/2009.  Anticoagulation instructions were given to patient.  Results were reviewed/authorized by Bethena Midget, RN, BSN.  He was notified by Bethena Midget, RN, BSN.         Prior Anticoagulation Instructions: INR 2.4 Continue 5mg s everyday except 2.5mg s on Mondays and Fridays. Recheck in 3 weeks.   Current Anticoagulation Instructions: INR 2.4 Continue 5mg s daily except  2.5mg s on Mondays and Fridays. Recheck in 4 weeks.

## 2010-06-04 NOTE — Medication Information (Signed)
Summary: [facility]  Anticoagulant Therapy  Managed by: Bethena Midget, RN, BSN Referring MD: Dr Unknown PCP: Dr. Earl Lites Supervising MD: Jens Som MD, Arlys John Indication 1: Pulmonary Embolism and Infarction (ICD-415.1) Indication 2: DVT prophalaxis (ICD-zzz) Lab Used: [facility] [place] Site: [address] INR POC 3.8 INR RANGE 2 - 3  Dietary changes: no    Health status changes: no    Bleeding/hemorrhagic complications: no    Recent/future hospitalizations: no    Any changes in medication regimen? no    Recent/future dental: no  Any missed doses?: no       Is patient compliant with meds? yes       Allergies (verified): No Known Drug Allergies  Anticoagulation Management History:      The patient is taking warfarin and comes in today for a routine follow up visit.  Negative risk factors for bleeding include an age less than 59 years old.  The bleeding index is 'low risk'.  Negative CHADS2 values include Age > 65 years old.  The start date was 12/31/2006.  His last INR was 3.3 RATIO.  Anticoagulation responsible provider: Jens Som MD, Arlys John.  INR POC: 3.8.  Cuvette Lot#: [id number].  Exp: 02/2010.    Anticoagulation Management Assessment/Plan:      The patient's current anticoagulation dose is Coumadin 5 mg tabs: Take as directed.  The target INR is 2 - 3.  The next INR is due 04/04/2009.  Anticoagulation instructions were given to patient.  Results were reviewed/authorized by Bethena Midget, RN, BSN.  He was notified by Baron Sane, PharmD Candidate.         Prior Anticoagulation Instructions: INR 3.8 Skip today's dose then take 5mg s everyday except 2.5mg s on Sundays. Recheck in 2 weeks.   Current Anticoagulation Instructions: INR 3.8  HOLD today's dose (03/23/2009), then take 1 tablet every day except on Monday and Friday take 1/2 tablet. Recheck INR on 04/04/2009.

## 2010-06-04 NOTE — Medication Information (Signed)
Summary: rov/kmw  Anticoagulant Therapy  Managed by: Bethena Midget, RN, BSN Referring MD: Dr Unknown PCP: Dr. Earl Lites Supervising MD: Myrtis Ser MD, Tinnie Gens Indication 1: Pulmonary Embolism and Infarction (ICD-415.1) Indication 2: DVT prophalaxis (ICD-zzz) Lab Used: [facility] [place] Site: [address] INR POC 2.4 INR RANGE 2 - 3  Dietary changes: no    Health status changes: no    Bleeding/hemorrhagic complications: no    Recent/future hospitalizations: no    Any changes in medication regimen? no    Recent/future dental: no  Any missed doses?: no       Is patient compliant with meds? yes       Allergies: No Known Drug Allergies  Anticoagulation Management History:      The patient is taking warfarin and comes in today for a routine follow up visit.  Negative risk factors for bleeding include an age less than 20 years old.  The bleeding index is 'low risk'.  Negative CHADS2 values include Age > 16 years old.  The start date was 12/31/2006.  His last INR was 3.3 RATIO.  Anticoagulation responsible provider: Myrtis Ser MD, Tinnie Gens.  INR POC: 2.4.  Cuvette Lot#: [id number].  Exp: 06/2010.    Anticoagulation Management Assessment/Plan:      The patient's current anticoagulation dose is Coumadin 5 mg tabs: Take as directed.  The target INR is 2 - 3.  The next INR is due 04/25/2009.  Anticoagulation instructions were given to patient.  Results were reviewed/authorized by Bethena Midget, RN, BSN.  He was notified by Bethena Midget, RN, BSN.         Prior Anticoagulation Instructions: INR 3.8  HOLD today's dose (03/23/2009), then take 1 tablet every day except on Monday and Friday take 1/2 tablet. Recheck INR on 04/04/2009.    Current Anticoagulation Instructions: INR 2.4 Continue 5mg s everyday except 2.5mg s on Mondays and Fridays. Recheck in 3 weeks.

## 2010-06-04 NOTE — Progress Notes (Signed)
Summary: Lovenox bridge for bone marrow biospy  ---- Converted from flag ---- ---- 01/11/2010 12:23 PM, Waymon Budge MD wrote: Marc Mills- He is high risk for recurrent DVT and just had another this year. I recommend he be set up with lovenox to bridge.  ---- 01/11/2010 10:28 AM, Cloyde Reams RN wrote: Dr. Maple Hudson, Marc Mills has bone marrow procedure scheduled for 01/17/10 and needs to stop Coumadin on 01/13/10. Patient has Lovenox at home but said that you and Dr. [name] were collaborating on the Lovenox bridge for the procedure and that Dr. [name] would be giving him instructions at an appointment on 01/15/10. Is this patient cleared to be off of the Coumadin? Does he need Lovenox bridge? ------------------------------

## 2010-06-05 ENCOUNTER — Encounter (HOSPITAL_COMMUNITY): Payer: Self-pay

## 2010-06-05 ENCOUNTER — Encounter: Payer: BC Managed Care – PPO | Admitting: Oncology

## 2010-06-05 ENCOUNTER — Ambulatory Visit (HOSPITAL_COMMUNITY)
Admission: RE | Admit: 2010-06-05 | Discharge: 2010-06-05 | Disposition: A | Discharge status: Home / Self Care | Payer: BC Managed Care – PPO | Source: Ambulatory Visit | Attending: Oncology | Admitting: Oncology

## 2010-06-05 DIAGNOSIS — C8590 Non-Hodgkin lymphoma, unspecified, unspecified site: Secondary | ICD-10-CM

## 2010-06-05 DIAGNOSIS — C8589 Other specified types of non-Hodgkin lymphoma, extranodal and solid organ sites: Secondary | ICD-10-CM | POA: Insufficient documentation

## 2010-06-05 DIAGNOSIS — Z23 Encounter for immunization: Secondary | ICD-10-CM

## 2010-06-05 DIAGNOSIS — Z9089 Acquired absence of other organs: Secondary | ICD-10-CM | POA: Insufficient documentation

## 2010-06-05 DIAGNOSIS — C8587 Other specified types of non-Hodgkin lymphoma, spleen: Secondary | ICD-10-CM

## 2010-06-05 HISTORY — DX: Malignant (primary) neoplasm, unspecified: C80.1

## 2010-06-05 LAB — CBC WITH DIFFERENTIAL/PLATELET
BASO%: 0.3 % (ref 0.0–2.0)
Basophils Absolute: 0 10*3/uL (ref 0.0–0.1)
EOS%: 5.3 % (ref 0.0–7.0)
Eosinophils Absolute: 0.4 10*3/uL (ref 0.0–0.5)
HCT: 43.6 % (ref 38.4–49.9)
HGB: 14.2 g/dL (ref 13.0–17.1)
LYMPH%: 12.1 % — ABNORMAL LOW (ref 14.0–49.0)
MCH: 28.1 pg (ref 27.2–33.4)
MCHC: 32.5 g/dL (ref 32.0–36.0)
MCV: 86.7 fL (ref 79.3–98.0)
MONO#: 0.5 10*3/uL (ref 0.1–0.9)
MONO%: 7.3 % (ref 0.0–14.0)
NEUT#: 5.4 10*3/uL (ref 1.5–6.5)
NEUT%: 75 % (ref 39.0–75.0)
Platelets: 355 10*3/uL (ref 140–400)
RBC: 5.03 10*6/uL (ref 4.20–5.82)
RDW: 18.2 % — ABNORMAL HIGH (ref 11.0–14.6)
WBC: 7.3 10*3/uL (ref 4.0–10.3)
lymph#: 0.9 10*3/uL (ref 0.9–3.3)

## 2010-06-05 LAB — GLUCOSE, CAPILLARY: Glucose-Capillary: 100 mg/dL — ABNORMAL HIGH (ref 70–99)

## 2010-06-05 MED ORDER — FLUDEOXYGLUCOSE F - 18 (FDG) INJECTION: 16.8000 | Freq: Once | INTRAVENOUS | Status: AC | PRN

## 2010-06-06 LAB — COMPREHENSIVE METABOLIC PANEL
ALT: 18 U/L (ref 0–53)
AST: 15 U/L (ref 0–37)
Albumin: 4.5 g/dL (ref 3.5–5.2)
Alkaline Phosphatase: 76 U/L (ref 39–117)
BUN: 13 mg/dL (ref 6–23)
CO2: 26 mEq/L (ref 19–32)
Calcium: 9.4 mg/dL (ref 8.4–10.5)
Chloride: 104 mEq/L (ref 96–112)
Creatinine, Ser: 0.69 mg/dL (ref 0.40–1.50)
Glucose, Bld: 128 mg/dL — ABNORMAL HIGH (ref 70–99)
Potassium: 4.3 mEq/L (ref 3.5–5.3)
Sodium: 140 mEq/L (ref 135–145)
Total Bilirubin: 0.5 mg/dL (ref 0.3–1.2)
Total Protein: 6.8 g/dL (ref 6.0–8.3)

## 2010-06-06 LAB — URIC ACID: Uric Acid, Serum: 5.4 mg/dL (ref 4.0–7.8)

## 2010-06-06 LAB — LACTATE DEHYDROGENASE: LDH: 104 U/L (ref 94–250)

## 2010-06-06 LAB — BETA 2 MICROGLOBULIN, SERUM: Beta-2 Microglobulin: 1.52 mg/L (ref 1.01–1.73)

## 2010-06-06 NOTE — Medication Information (Signed)
Summary: rov/tm  Anticoagulant Therapy  Managed by: Bethena Midget, RN, BSN Referring MD: Dr Unknown PCP: Dr. Earl Lites Supervising MD: Jens Som MD, Arlys John Indication 1: Pulmonary Embolism and Infarction (ICD-415.1) Indication 2: DVT prophalaxis (ICD-zzz) Lab Used: [facility] [place] Site: [address] INR POC 2.0 INR RANGE 2 - 3  Dietary changes: no    Health status changes: no    Bleeding/hemorrhagic complications: no    Recent/future hospitalizations: no    Any changes in medication regimen? no    Recent/future dental: no  Any missed doses?: no       Is patient compliant with meds? yes       Allergies: No Known Drug Allergies  Anticoagulation Management History:      The patient is taking warfarin and comes in today for a routine follow up visit.  Negative risk factors for bleeding include an age less than 34 years old.  The bleeding index is 'low risk'.  Negative CHADS2 values include Age > 86 years old.  The start date was 12/31/2006.  His last INR was 3.3 RATIO.  Anticoagulation responsible provider: Jens Som MD, Arlys John.  INR POC: 2.0.  Cuvette Lot#: [id number].  Exp: 12/2010.    Anticoagulation Management Assessment/Plan:      The patient's current anticoagulation dose is Warfarin sodium 5 mg tabs: Use as directed by Anticoagulation Clinic.  The target INR is 2 - 3.  The next INR is due 06/22/2010.  Anticoagulation instructions were given to patient.  Results were reviewed/authorized by Bethena Midget, RN, BSN.  He was notified by Bethena Midget, RN, BSN.         Prior Anticoagulation Instructions: INR 2.1 Continue 5mg s everyday except 2.5mg s on Fridays. Recheck in 4 weeks.   Current Anticoagulation Instructions: INR 2.0 Continue 5mg s daily except 2.5mg s on Fridays. Recheck in 4 weeks.
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
## 2010-06-06 NOTE — Medication Information (Signed)
Summary: rov/sl  Anticoagulant Therapy  Managed by: Bethena Midget, RN, BSN Referring MD: Dr Unknown PCP: Dr. Earl Lites Supervising MD: Jens Som MD, Arlys John Indication 1: Pulmonary Embolism and Infarction (ICD-415.1) Indication 2: DVT prophalaxis (ICD-zzz) Lab Used: [facility] [place] Site: [address] INR POC 2.1 INR RANGE 2 - 3  Dietary changes: no    Health status changes: no    Bleeding/hemorrhagic complications: no    Recent/future hospitalizations: no    Any changes in medication regimen? no    Recent/future dental: no  Any missed doses?: no       Is patient compliant with meds? yes       Allergies: No Known Drug Allergies  Anticoagulation Management History:      The patient is taking warfarin and comes in today for a routine follow up visit.  Negative risk factors for bleeding include an age less than 62 years old.  The bleeding index is 'low risk'.  Negative CHADS2 values include Age > 41 years old.  The start date was 12/31/2006.  His last INR was 3.3 RATIO.  Anticoagulation responsible provider: Jens Som MD, Arlys John.  INR POC: 2.1.  Cuvette Lot#: [id number].  Exp: 02/.    Anticoagulation Management Assessment/Plan:      The patient's current anticoagulation dose is Warfarin sodium 5 mg tabs: Use as directed by Anticoagulation Clinic.  The target INR is 2 - 3.  The next INR is due 05/13/2010.  Anticoagulation instructions were given to patient.  Results were reviewed/authorized by Bethena Midget, RN, BSN.  He was notified by Bethena Midget, RN, BSN.         Prior Anticoagulation Instructions: INR 1.7  Take Coumadin 1 tab (5 mg) on all days except for Coumadin 0.5 tab (2.5 mg) on Fridays. Return to clinic in 3 weeks.   Current Anticoagulation Instructions: INR 2.1 Continue 5mg s everyday except 2.5mg s on Fridays. Recheck in 4 weeks.

## 2010-06-10 ENCOUNTER — Encounter: Payer: Self-pay | Admitting: Cardiology

## 2010-06-10 ENCOUNTER — Encounter (INDEPENDENT_AMBULATORY_CARE_PROVIDER_SITE_OTHER): Payer: BC Managed Care – PPO

## 2010-06-10 DIAGNOSIS — Z7901 Long term (current) use of anticoagulants: Secondary | ICD-10-CM

## 2010-06-10 DIAGNOSIS — I2699 Other pulmonary embolism without acute cor pulmonale: Secondary | ICD-10-CM

## 2010-06-10 LAB — CONVERTED CEMR LAB: POC INR: 1.7

## 2010-06-12 ENCOUNTER — Other Ambulatory Visit: Payer: Self-pay | Admitting: Oncology

## 2010-06-12 ENCOUNTER — Encounter (HOSPITAL_BASED_OUTPATIENT_CLINIC_OR_DEPARTMENT_OTHER): Payer: BC Managed Care – PPO | Admitting: Oncology

## 2010-06-12 ENCOUNTER — Encounter: Payer: Self-pay | Admitting: Internal Medicine

## 2010-06-12 DIAGNOSIS — C8587 Other specified types of non-Hodgkin lymphoma, spleen: Secondary | ICD-10-CM

## 2010-06-12 DIAGNOSIS — C8590 Non-Hodgkin lymphoma, unspecified, unspecified site: Secondary | ICD-10-CM

## 2010-06-18 DIAGNOSIS — I2699 Other pulmonary embolism without acute cor pulmonale: Secondary | ICD-10-CM | POA: Insufficient documentation

## 2010-06-20 NOTE — Medication Information (Signed)
Summary: Coumadin Clinic  Anticoagulant Therapy  Managed by: Cloyde Reams, RN, BSN Referring MD: Dr Unknown PCP: Dr. Earl Lites Supervising MD: Myrtis Ser MD, Tinnie Gens Indication 1: Pulmonary Embolism and Infarction (ICD-415.1) Indication 2: DVT prophalaxis (ICD-zzz) Lab Used: [facility] [place] Site: [address] INR POC 1.7 INR RANGE 2 - 3  Dietary changes: no    Health status changes: no    Bleeding/hemorrhagic complications: no    Recent/future hospitalizations: no    Any changes in medication regimen? no    Recent/future dental: no  Any missed doses?: no       Is patient compliant with meds? yes       Allergies: No Known Drug Allergies  Anticoagulation Management History:      The patient is taking warfarin and comes in today for a routine follow up visit.  Negative risk factors for bleeding include an age less than 82 years old.  The bleeding index is 'low risk'.  Negative CHADS2 values include Age > 6 years old.  The start date was 12/31/2006.  His last INR was 3.3 RATIO.  Anticoagulation responsible provider: Myrtis Ser MD, Tinnie Gens.  INR POC: 1.7.  Cuvette Lot#: [id number].  Exp: 05/2011.    Anticoagulation Management Assessment/Plan:      The patient's current anticoagulation dose is Warfarin sodium 5 mg tabs: Use as directed by Anticoagulation Clinic.  The target INR is 2 - 3.  The next INR is due 07/01/2010.  Anticoagulation instructions were given to patient.  Results were reviewed/authorized by Cloyde Reams, RN, BSN.  He was notified by Cloyde Reams RN.         Prior Anticoagulation Instructions: INR 2.0 Continue 5mg s daily except 2.5mg s on Fridays. Recheck in 4 weeks.   Current Anticoagulation Instructions: INR 1.7  Take 1.5 tablets today, then start taking 1 tablet daily.  Recheck in 3 weeks.

## 2010-07-01 ENCOUNTER — Encounter (INDEPENDENT_AMBULATORY_CARE_PROVIDER_SITE_OTHER): Payer: BC Managed Care – PPO

## 2010-07-01 ENCOUNTER — Encounter: Payer: Self-pay | Admitting: Internal Medicine

## 2010-07-01 DIAGNOSIS — I2699 Other pulmonary embolism without acute cor pulmonale: Secondary | ICD-10-CM

## 2010-07-01 DIAGNOSIS — Z7901 Long term (current) use of anticoagulants: Secondary | ICD-10-CM

## 2010-07-01 LAB — CONVERTED CEMR LAB: POC INR: 1.6

## 2010-07-11 NOTE — Medication Information (Addendum)
Summary: rov/kh   Anticoagulant Therapy  Managed by: Tammy Sours, PharmD Referring MD: Dr Unknown PCP: Dr. Earl Lites Supervising MD: Tenny Craw MD, Gunnar Fusi Indication 1: Pulmonary Embolism and Infarction (ICD-415.1) Indication 2: DVT prophalaxis (ICD-zzz) Lab Used: [facility] [place] Site: [address] INR POC 1.6 INR RANGE 2 - 3  Dietary changes: no    Health status changes: no    Bleeding/hemorrhagic complications: no    Recent/future hospitalizations: no    Any changes in medication regimen? no    Recent/future dental: no  Any missed doses?: yes     Details: 1 dose 17 days ago.   Is patient compliant with meds? yes      Comments: Patient states he is a vegetarian and eats lots of dark leafy greens but has been keeping his diet consistent   Allergies: No Known Drug Allergies  Anticoagulation Management History:      The patient is taking warfarin and comes in today for a routine follow up visit.  Negative risk factors for bleeding include an age less than 69 years old.  The bleeding index is 'low risk'.  Negative CHADS2 values include Age > 83 years old.  The start date was 12/31/2006.  His last INR was 3.3 RATIO.  Anticoagulation responsible provider: Tenny Craw MD, Gunnar Fusi.  INR POC: 1.6.  Cuvette Lot#: [id number].  Exp: 05/2011.    Anticoagulation Management Assessment/Plan:      The patient's current anticoagulation dose is Warfarin sodium 5 mg tabs: Use as directed by Anticoagulation Clinic.  The target INR is 2 - 3.  The next INR is due 07/22/2010.  Anticoagulation instructions were given to patient.  Results were reviewed/authorized by Tammy Sours, PharmD.         Prior Anticoagulation Instructions: INR 1.7  Take 1.5 tablets today, then start taking 1 tablet daily.  Recheck in 3 weeks.    Current Anticoagulation Instructions: INR 1.6   Take 2 tablets today. Then start taking 1 tablet daily except for 1 and 1/2 tablets on Mondays and Fridays. Recheck  INR in 3 weeks.

## 2010-07-16 LAB — GLUCOSE, CAPILLARY: Glucose-Capillary: 89 mg/dL (ref 70–99)

## 2010-07-16 NOTE — Letter (Signed)
Summary: [facility]  [facility]   Imported By: Sherian Rein 07/10/2010 12:52:06  _____________________________________________________________________  External Attachment:    Type:   Image     Comment:   External Document

## 2010-07-18 LAB — BASIC METABOLIC PANEL
BUN: 10 mg/dL (ref 6–23)
BUN: 3 mg/dL — ABNORMAL LOW (ref 6–23)
BUN: 5 mg/dL — ABNORMAL LOW (ref 6–23)
CO2: 28 mEq/L (ref 19–32)
CO2: 29 mEq/L (ref 19–32)
CO2: 32 mEq/L (ref 19–32)
Calcium: 8.3 mg/dL — ABNORMAL LOW (ref 8.4–10.5)
Calcium: 8.8 mg/dL (ref 8.4–10.5)
Calcium: 9.2 mg/dL (ref 8.4–10.5)
Chloride: 100 mEq/L (ref 96–112)
Chloride: 101 mEq/L (ref 96–112)
Chloride: 104 mEq/L (ref 96–112)
Creatinine, Ser: 0.67 mg/dL (ref 0.4–1.5)
Creatinine, Ser: 0.72 mg/dL (ref 0.4–1.5)
Creatinine, Ser: 0.86 mg/dL (ref 0.4–1.5)
GFR calc Af Amer: >60 mL/min (ref >60)
GFR calc Af Amer: >60 mL/min (ref >60)
GFR calc Af Amer: >60 mL/min (ref >60)
GFR calc non Af Amer: >60 mL/min (ref >60)
GFR calc non Af Amer: >60 mL/min (ref >60)
GFR calc non Af Amer: >60 mL/min (ref >60)
Glucose, Bld: 108 mg/dL — ABNORMAL HIGH (ref 70–99)
Glucose, Bld: 115 mg/dL — ABNORMAL HIGH (ref 70–99)
Glucose, Bld: 130 mg/dL — ABNORMAL HIGH (ref 70–99)
Potassium: 3.8 mEq/L (ref 3.5–5.1)
Potassium: 4.1 mEq/L (ref 3.5–5.1)
Potassium: 4.2 mEq/L (ref 3.5–5.1)
Sodium: 136 mEq/L (ref 135–145)
Sodium: 139 mEq/L (ref 135–145)
Sodium: 139 mEq/L (ref 135–145)

## 2010-07-18 LAB — CBC
HCT: 36.2 % — ABNORMAL LOW (ref 39.0–52.0)
HCT: 32.2 % — ABNORMAL LOW (ref 39.0–52.0)
HCT: 33.3 % — ABNORMAL LOW (ref 39.0–52.0)
HCT: 33.7 % — ABNORMAL LOW (ref 39.0–52.0)
HCT: 34.4 % — ABNORMAL LOW (ref 39.0–52.0)
HCT: 35.2 % — ABNORMAL LOW (ref 39.0–52.0)
HCT: 35.4 % — ABNORMAL LOW (ref 39.0–52.0)
HCT: 35.7 % — ABNORMAL LOW (ref 39.0–52.0)
HCT: 38.1 % — ABNORMAL LOW (ref 39.0–52.0)
HCT: 38.9 % — ABNORMAL LOW (ref 39.0–52.0)
HCT: 39.2 % (ref 39.0–52.0)
Hemoglobin: 12 g/dL — ABNORMAL LOW (ref 13.0–17.0)
Hemoglobin: 10.5 g/dL — ABNORMAL LOW (ref 13.0–17.0)
Hemoglobin: 10.9 g/dL — ABNORMAL LOW (ref 13.0–17.0)
Hemoglobin: 10.9 g/dL — ABNORMAL LOW (ref 13.0–17.0)
Hemoglobin: 11.3 g/dL — ABNORMAL LOW (ref 13.0–17.0)
Hemoglobin: 11.3 g/dL — ABNORMAL LOW (ref 13.0–17.0)
Hemoglobin: 11.4 g/dL — ABNORMAL LOW (ref 13.0–17.0)
Hemoglobin: 11.4 g/dL — ABNORMAL LOW (ref 13.0–17.0)
Hemoglobin: 12.5 g/dL — ABNORMAL LOW (ref 13.0–17.0)
Hemoglobin: 12.6 g/dL — ABNORMAL LOW (ref 13.0–17.0)
Hemoglobin: 12.7 g/dL — ABNORMAL LOW (ref 13.0–17.0)
MCH: 27.6 pg (ref 26.0–34.0)
MCH: 25.9 pg — ABNORMAL LOW (ref 26.0–34.0)
MCH: 25.9 pg — ABNORMAL LOW (ref 26.0–34.0)
MCH: 26.3 pg (ref 26.0–34.0)
MCH: 26.4 pg (ref 26.0–34.0)
MCH: 26.5 pg (ref 26.0–34.0)
MCH: 26.5 pg (ref 26.0–34.0)
MCH: 26.8 pg (ref 26.0–34.0)
MCH: 26.8 pg (ref 26.0–34.0)
MCH: 27 pg (ref 26.0–34.0)
MCH: 27 pg (ref 26.0–34.0)
MCHC: 33.1 g/dL (ref 30.0–36.0)
MCHC: 31.9 g/dL (ref 30.0–36.0)
MCHC: 32.1 g/dL (ref 30.0–36.0)
MCHC: 32.2 g/dL (ref 30.0–36.0)
MCHC: 32.3 g/dL (ref 30.0–36.0)
MCHC: 32.4 g/dL (ref 30.0–36.0)
MCHC: 32.4 g/dL (ref 30.0–36.0)
MCHC: 32.6 g/dL (ref 30.0–36.0)
MCHC: 32.7 g/dL (ref 30.0–36.0)
MCHC: 32.8 g/dL (ref 30.0–36.0)
MCHC: 32.8 g/dL (ref 30.0–36.0)
MCV: 83.5 fL (ref 78.0–100.0)
MCV: 80.5 fL (ref 78.0–100.0)
MCV: 80.9 fL (ref 78.0–100.0)
MCV: 81.1 fL (ref 78.0–100.0)
MCV: 81.2 fL (ref 78.0–100.0)
MCV: 81.6 fL (ref 78.0–100.0)
MCV: 82.1 fL (ref 78.0–100.0)
MCV: 82.1 fL (ref 78.0–100.0)
MCV: 82.4 fL (ref 78.0–100.0)
MCV: 82.4 fL (ref 78.0–100.0)
MCV: 82.9 fL (ref 78.0–100.0)
Platelets: 591 10*3/uL — ABNORMAL HIGH (ref 150–400)
Platelets: 129 10*3/uL — ABNORMAL LOW (ref 150–400)
Platelets: 131 10*3/uL — ABNORMAL LOW (ref 150–400)
Platelets: 132 10*3/uL — ABNORMAL LOW (ref 150–400)
Platelets: 136 10*3/uL — ABNORMAL LOW (ref 150–400)
Platelets: 143 10*3/uL — ABNORMAL LOW (ref 150–400)
Platelets: 143 10*3/uL — ABNORMAL LOW (ref 150–400)
Platelets: 152 10*3/uL (ref 150–400)
Platelets: 163 10*3/uL (ref 150–400)
Platelets: 219 10*3/uL (ref 150–400)
Platelets: 223 10*3/uL (ref 150–400)
RBC: 4.34 MIL/uL (ref 4.22–5.81)
RBC: 3.92 MIL/uL — ABNORMAL LOW (ref 4.22–5.81)
RBC: 4.04 MIL/uL — ABNORMAL LOW (ref 4.22–5.81)
RBC: 4.13 MIL/uL — ABNORMAL LOW (ref 4.22–5.81)
RBC: 4.19 MIL/uL — ABNORMAL LOW (ref 4.22–5.81)
RBC: 4.27 MIL/uL (ref 4.22–5.81)
RBC: 4.4 MIL/uL (ref 4.22–5.81)
RBC: 4.4 MIL/uL (ref 4.22–5.81)
RBC: 4.71 MIL/uL (ref 4.22–5.81)
RBC: 4.73 MIL/uL (ref 4.22–5.81)
RBC: 4.79 MIL/uL (ref 4.22–5.81)
RDW: 16.8 % — ABNORMAL HIGH (ref 11.5–15.5)
RDW: 15.3 % (ref 11.5–15.5)
RDW: 15.5 % (ref 11.5–15.5)
RDW: 15.5 % (ref 11.5–15.5)
RDW: 15.5 % (ref 11.5–15.5)
RDW: 15.5 % (ref 11.5–15.5)
RDW: 15.5 % (ref 11.5–15.5)
RDW: 15.6 % — ABNORMAL HIGH (ref 11.5–15.5)
RDW: 15.6 % — ABNORMAL HIGH (ref 11.5–15.5)
RDW: 15.6 % — ABNORMAL HIGH (ref 11.5–15.5)
RDW: 15.7 % — ABNORMAL HIGH (ref 11.5–15.5)
WBC: 7.7 10*3/uL (ref 4.0–10.5)
WBC: 10 10*3/uL (ref 4.0–10.5)
WBC: 11 10*3/uL — ABNORMAL HIGH (ref 4.0–10.5)
WBC: 12 10*3/uL — ABNORMAL HIGH (ref 4.0–10.5)
WBC: 15.4 10*3/uL — ABNORMAL HIGH (ref 4.0–10.5)
WBC: 5.7 10*3/uL (ref 4.0–10.5)
WBC: 6.8 10*3/uL (ref 4.0–10.5)
WBC: 6.9 10*3/uL (ref 4.0–10.5)
WBC: 7.4 10*3/uL (ref 4.0–10.5)
WBC: 7.9 10*3/uL (ref 4.0–10.5)
WBC: 9.4 10*3/uL (ref 4.0–10.5)

## 2010-07-18 LAB — COMPREHENSIVE METABOLIC PANEL
ALT: 14 U/L (ref 0–53)
AST: 31 U/L (ref 0–37)
Albumin: 3.5 g/dL (ref 3.5–5.2)
Alkaline Phosphatase: 75 U/L (ref 39–117)
BUN: 10 mg/dL (ref 6–23)
CO2: 30 mEq/L (ref 19–32)
Calcium: 9.1 mg/dL (ref 8.4–10.5)
Chloride: 104 mEq/L (ref 96–112)
Creatinine, Ser: 0.76 mg/dL (ref 0.4–1.5)
GFR calc Af Amer: >60 mL/min (ref >60)
GFR calc non Af Amer: >60 mL/min (ref >60)
Glucose, Bld: 102 mg/dL — ABNORMAL HIGH (ref 70–99)
Potassium: 4.5 mEq/L (ref 3.5–5.1)
Sodium: 140 mEq/L (ref 135–145)
Total Bilirubin: 0.8 mg/dL (ref 0.3–1.2)
Total Protein: 6.2 g/dL (ref 6.0–8.3)

## 2010-07-18 LAB — PROTIME-INR
INR: 1.2 (ref 0.00–1.49)
INR: 1.13 (ref 0.00–1.49)
INR: 1.13 (ref 0.00–1.49)
INR: 1.15 (ref 0.00–1.49)
INR: 1.21 (ref 0.00–1.49)
Prothrombin Time: 15.4 seconds — ABNORMAL HIGH (ref 11.6–15.2)
Prothrombin Time: 14.7 seconds (ref 11.6–15.2)
Prothrombin Time: 14.7 seconds (ref 11.6–15.2)
Prothrombin Time: 14.9 seconds (ref 11.6–15.2)
Prothrombin Time: 15.5 seconds — ABNORMAL HIGH (ref 11.6–15.2)

## 2010-07-18 LAB — HEPARIN LEVEL (UNFRACTIONATED)
Heparin Unfractionated: 0.31 IU/mL (ref 0.30–0.70)
Heparin Unfractionated: 0.55 IU/mL (ref 0.30–0.70)
Heparin Unfractionated: 0.77 IU/mL — ABNORMAL HIGH (ref 0.30–0.70)
Heparin Unfractionated: <0.1 IU/mL — ABNORMAL LOW (ref 0.30–0.70)
Heparin Unfractionated: <0.1 IU/mL — ABNORMAL LOW (ref 0.30–0.70)
Heparin Unfractionated: <0.1 IU/mL — ABNORMAL LOW (ref 0.30–0.70)
Heparin Unfractionated: <0.1 IU/mL — ABNORMAL LOW (ref 0.30–0.70)
Heparin Unfractionated: >2 IU/mL — ABNORMAL HIGH (ref 0.30–0.70)

## 2010-07-18 LAB — CROSSMATCH
ABO/RH(D): B POS
Antibody Screen: NEGATIVE

## 2010-07-18 LAB — POCT I-STAT 4, (NA,K, GLUC, HGB,HCT)
Glucose, Bld: 159 mg/dL — ABNORMAL HIGH (ref 70–99)
HCT: 32 % — ABNORMAL LOW (ref 39.0–52.0)
Hemoglobin: 10.9 g/dL — ABNORMAL LOW (ref 13.0–17.0)
Potassium: 4.3 meq/L (ref 3.5–5.1)
Sodium: 140 meq/L (ref 135–145)

## 2010-07-18 LAB — GLUCOSE, CAPILLARY
Glucose-Capillary: 97 mg/dL (ref 70–99)
Glucose-Capillary: 117 mg/dL — ABNORMAL HIGH (ref 70–99)
Glucose-Capillary: 151 mg/dL — ABNORMAL HIGH (ref 70–99)

## 2010-07-18 LAB — DIFFERENTIAL
Basophils Absolute: 0 10*3/uL (ref 0.0–0.1)
Basophils Absolute: 0 10*3/uL (ref 0.0–0.1)
Basophils Relative: 0 % (ref 0–1)
Basophils Relative: 1 % (ref 0–1)
Eosinophils Absolute: 0.2 10*3/uL (ref 0.0–0.7)
Eosinophils Absolute: 0.3 10*3/uL (ref 0.0–0.7)
Eosinophils Relative: 3 % (ref 0–5)
Eosinophils Relative: 3 % (ref 0–5)
Lymphocytes Relative: 10 % — ABNORMAL LOW (ref 12–46)
Lymphocytes Relative: 9 % — ABNORMAL LOW (ref 12–46)
Lymphs Abs: 0.7 10*3/uL (ref 0.7–4.0)
Lymphs Abs: 0.8 10*3/uL (ref 0.7–4.0)
Monocytes Absolute: 0.5 10*3/uL (ref 0.1–1.0)
Monocytes Absolute: 1.3 10*3/uL — ABNORMAL HIGH (ref 0.1–1.0)
Monocytes Relative: 16 % — ABNORMAL HIGH (ref 3–12)
Monocytes Relative: 7 % (ref 3–12)
Neutro Abs: 5.7 10*3/uL (ref 1.7–7.7)
Neutro Abs: 5.8 10*3/uL (ref 1.7–7.7)
Neutrophils Relative %: 72 % (ref 43–77)
Neutrophils Relative %: 79 % — ABNORMAL HIGH (ref 43–77)

## 2010-07-18 LAB — MRSA PCR SCREENING: MRSA by PCR: NEGATIVE

## 2010-07-18 LAB — HEPARIN ANTI-XA: Heparin LMW: 0.62 IU/mL

## 2010-07-18 LAB — APTT: aPTT: 41 seconds — ABNORMAL HIGH (ref 24–37)

## 2010-07-18 LAB — CHROMOSOME ANALYSIS, BONE MARROW

## 2010-07-18 LAB — BONE MARROW EXAM

## 2010-07-22 ENCOUNTER — Encounter (INDEPENDENT_AMBULATORY_CARE_PROVIDER_SITE_OTHER): Payer: BC Managed Care – PPO

## 2010-07-22 ENCOUNTER — Encounter: Payer: Self-pay | Admitting: Cardiology

## 2010-07-22 DIAGNOSIS — Z7901 Long term (current) use of anticoagulants: Secondary | ICD-10-CM

## 2010-07-22 DIAGNOSIS — I2699 Other pulmonary embolism without acute cor pulmonale: Secondary | ICD-10-CM

## 2010-07-22 LAB — CONVERTED CEMR LAB: POC INR: 2.1

## 2010-07-23 LAB — ABO/RH: ABO/RH(D): B POS

## 2010-07-23 LAB — PREPARE FRESH FROZEN PLASMA

## 2010-07-23 LAB — CBC
HCT: 32.3 % — ABNORMAL LOW (ref 39.0–52.0)
HCT: 34.9 % — ABNORMAL LOW (ref 39.0–52.0)
HCT: 35.3 % — ABNORMAL LOW (ref 39.0–52.0)
Hemoglobin: 11 g/dL — ABNORMAL LOW (ref 13.0–17.0)
Hemoglobin: 11.8 g/dL — ABNORMAL LOW (ref 13.0–17.0)
Hemoglobin: 12.1 g/dL — ABNORMAL LOW (ref 13.0–17.0)
MCHC: 33.9 g/dL (ref 30.0–36.0)
MCHC: 34.2 g/dL (ref 30.0–36.0)
MCHC: 34.3 g/dL (ref 30.0–36.0)
MCV: 79.1 fL (ref 78.0–100.0)
MCV: 79.1 fL (ref 78.0–100.0)
MCV: 79.7 fL (ref 78.0–100.0)
Platelets: 148 10*3/uL — ABNORMAL LOW (ref 150–400)
Platelets: 154 10*3/uL (ref 150–400)
Platelets: 174 10*3/uL (ref 150–400)
RBC: 4.08 MIL/uL — ABNORMAL LOW (ref 4.22–5.81)
RBC: 4.38 MIL/uL (ref 4.22–5.81)
RBC: 4.46 MIL/uL (ref 4.22–5.81)
RDW: 15.5 % (ref 11.5–15.5)
RDW: 16.1 % — ABNORMAL HIGH (ref 11.5–15.5)
RDW: 16.2 % — ABNORMAL HIGH (ref 11.5–15.5)
WBC: 5.3 10*3/uL (ref 4.0–10.5)
WBC: 6.9 10*3/uL (ref 4.0–10.5)
WBC: 7.6 10*3/uL (ref 4.0–10.5)

## 2010-07-23 LAB — CROSSMATCH
ABO/RH(D): B POS
Antibody Screen: NEGATIVE

## 2010-07-23 LAB — PROTIME-INR
INR: 1.36 (ref 0.00–1.49)
INR: 1.6 — ABNORMAL HIGH (ref 0.00–1.49)
INR: 2.88 — ABNORMAL HIGH (ref 0.00–1.49)
Prothrombin Time: 16.7 seconds — ABNORMAL HIGH (ref 11.6–15.2)
Prothrombin Time: 18.9 seconds — ABNORMAL HIGH (ref 11.6–15.2)
Prothrombin Time: 29.9 seconds — ABNORMAL HIGH (ref 11.6–15.2)

## 2010-07-23 LAB — BASIC METABOLIC PANEL
BUN: 17 mg/dL (ref 6–23)
CO2: 28 mEq/L (ref 19–32)
Calcium: 9.4 mg/dL (ref 8.4–10.5)
Chloride: 102 mEq/L (ref 96–112)
Creatinine, Ser: 0.82 mg/dL (ref 0.4–1.5)
GFR calc Af Amer: >60 mL/min (ref >60)
GFR calc non Af Amer: >60 mL/min (ref >60)
Glucose, Bld: 92 mg/dL (ref 70–99)
Potassium: 4.2 mEq/L (ref 3.5–5.1)
Sodium: 138 mEq/L (ref 135–145)

## 2010-07-23 LAB — MRSA PCR SCREENING: MRSA by PCR: NEGATIVE

## 2010-07-23 LAB — APTT: aPTT: 55 seconds — ABNORMAL HIGH (ref 24–37)

## 2010-07-24 LAB — CBC
HCT: 35.9 % — ABNORMAL LOW (ref 39.0–52.0)
Hemoglobin: 11.8 g/dL — ABNORMAL LOW (ref 13.0–17.0)
MCHC: 32.9 g/dL (ref 30.0–36.0)
MCV: 79.7 fL (ref 78.0–100.0)
Platelets: 187 10*3/uL (ref 150–400)
RBC: 4.5 MIL/uL (ref 4.22–5.81)
RDW: 14.9 % (ref 11.5–15.5)
WBC: 8.3 10*3/uL (ref 4.0–10.5)

## 2010-07-24 LAB — PROTIME-INR
INR: 2.56 — ABNORMAL HIGH (ref 0.00–1.49)
Prothrombin Time: 27.3 seconds — ABNORMAL HIGH (ref 11.6–15.2)

## 2010-08-01 NOTE — Medication Information (Signed)
Summary: rov/sp  Anticoagulant Therapy  Managed by: Windell Hummingbird, RN Referring MD: Dr Unknown PCP: Dr. Earl Lites Supervising MD: Jens Som MD, Arlys John Indication 1: Pulmonary Embolism and Infarction (ICD-415.1) Indication 2: DVT prophalaxis (ICD-zzz) Lab Used: [facility] [place] Site: [address] INR POC 2.1 INR RANGE 2 - 3  Dietary changes: no    Health status changes: no    Bleeding/hemorrhagic complications: no    Recent/future hospitalizations: no    Any changes in medication regimen? no    Recent/future dental: no  Any missed doses?: no       Is patient compliant with meds? yes       Allergies: No Known Drug Allergies  Anticoagulation Management History:      The patient is taking warfarin and comes in today for a routine follow up visit.  Negative risk factors for bleeding include an age less than 62 years old.  The bleeding index is 'low risk'.  Negative CHADS2 values include Age > 87 years old.  The start date was 12/31/2006.  His last INR was 3.3 RATIO.  Anticoagulation responsible provider: Jens Som MD, Arlys John.  INR POC: 2.1.  Cuvette Lot#: [id number].  Exp: 07/2011.    Anticoagulation Management Assessment/Plan:      The patient's current anticoagulation dose is Warfarin sodium 5 mg tabs: Use as directed by Anticoagulation Clinic.  The target INR is 2 - 3.  The next INR is due 08/19/2010.  Anticoagulation instructions were given to patient.  Results were reviewed/authorized by Windell Hummingbird, RN.  He was notified by Windell Hummingbird, RN.         Prior Anticoagulation Instructions: INR 1.6   Take 2 tablets today. Then start taking 1 tablet daily except for 1 and 1/2 tablets on Mondays and Fridays. Recheck INR in 3 weeks.   Current Anticoagulation Instructions: INR 2.1 Continue taking 1 tablet every day, except take 1 1/2 tablets on Mondays and Fridays. Recheck in 4 weeks.

## 2010-08-14 ENCOUNTER — Encounter (HOSPITAL_BASED_OUTPATIENT_CLINIC_OR_DEPARTMENT_OTHER): Payer: BC Managed Care – PPO | Admitting: Oncology

## 2010-08-14 ENCOUNTER — Other Ambulatory Visit: Payer: Self-pay | Admitting: Oncology

## 2010-08-14 DIAGNOSIS — C8587 Other specified types of non-Hodgkin lymphoma, spleen: Secondary | ICD-10-CM

## 2010-08-14 LAB — CBC WITH DIFFERENTIAL/PLATELET
BASO%: 1.2 % (ref 0.0–2.0)
Basophils Absolute: 0.1 10*3/uL (ref 0.0–0.1)
EOS%: 8.2 % — ABNORMAL HIGH (ref 0.0–7.0)
Eosinophils Absolute: 0.5 10*3/uL (ref 0.0–0.5)
HCT: 43.2 % (ref 38.4–49.9)
HGB: 14.6 g/dL (ref 13.0–17.1)
LYMPH%: 16.2 % (ref 14.0–49.0)
MCH: 29 pg (ref 27.2–33.4)
MCHC: 33.8 g/dL (ref 32.0–36.0)
MCV: 85.9 fL (ref 79.3–98.0)
MONO#: 1 10*3/uL — ABNORMAL HIGH (ref 0.1–0.9)
MONO%: 16.7 % — ABNORMAL HIGH (ref 0.0–14.0)
NEUT#: 3.4 10*3/uL (ref 1.5–6.5)
NEUT%: 57.7 % (ref 39.0–75.0)
Platelets: 298 10*3/uL (ref 140–400)
RBC: 5.03 10*6/uL (ref 4.20–5.82)
RDW: 15.7 % — ABNORMAL HIGH (ref 11.0–14.6)
WBC: 5.8 10*3/uL (ref 4.0–10.3)
lymph#: 0.9 10*3/uL (ref 0.9–3.3)
nRBC: 0 % (ref 0–0)

## 2010-08-15 LAB — BETA 2 MICROGLOBULIN, SERUM: Beta-2 Microglobulin: 1.71 mg/L (ref 1.01–1.73)

## 2010-08-15 LAB — COMPREHENSIVE METABOLIC PANEL
ALT: 20 U/L (ref 0–53)
AST: 20 U/L (ref 0–37)
Albumin: 4.4 g/dL (ref 3.5–5.2)
Alkaline Phosphatase: 64 U/L (ref 39–117)
BUN: 17 mg/dL (ref 6–23)
CO2: 24 mEq/L (ref 19–32)
Calcium: 9.4 mg/dL (ref 8.4–10.5)
Chloride: 106 mEq/L (ref 96–112)
Creatinine, Ser: 0.72 mg/dL (ref 0.40–1.50)
Glucose, Bld: 98 mg/dL (ref 70–99)
Potassium: 4.8 mEq/L (ref 3.5–5.3)
Sodium: 139 mEq/L (ref 135–145)
Total Bilirubin: 0.4 mg/dL (ref 0.3–1.2)
Total Protein: 6.6 g/dL (ref 6.0–8.3)

## 2010-08-15 LAB — URIC ACID: Uric Acid, Serum: 5 mg/dL (ref 4.0–7.8)

## 2010-08-15 LAB — LACTATE DEHYDROGENASE: LDH: 94 U/L (ref 94–250)

## 2010-08-22 ENCOUNTER — Encounter: Payer: BC Managed Care – PPO | Admitting: *Deleted

## 2010-08-23 ENCOUNTER — Ambulatory Visit (INDEPENDENT_AMBULATORY_CARE_PROVIDER_SITE_OTHER): Payer: BC Managed Care – PPO | Admitting: *Deleted

## 2010-08-23 DIAGNOSIS — I2699 Other pulmonary embolism without acute cor pulmonale: Secondary | ICD-10-CM

## 2010-08-23 LAB — POCT INR: INR: 2.6
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
## 2010-09-20 NOTE — Op Note (Signed)
[place]. [facility]  Patient:    Marc Mills, Marc Mills Visit Number: [id number] MRN: [id number]          Service Type: OBV Location: [id number] [id number] Attending Physician:  Ernesto Rutherford Dictated by:   Ernesto Rutherford, M.D. Proc. Date: 11/02/01 Admit Date:  11/02/2001 Discharge Date: 11/04/2001   CC:         Will Sherryle Lis, O.D., Optometrist, [place], [place]   Operative Report  PREOPERATIVE DIAGNOSIS:  Rhegmatogenous retinal detachment to the left eye-- macular threat.  POSTOPERATIVE DIAGNOSIS:  Rhegmatogenous retinal detachment to the left eye-- macular threat.  PROCEDURES PERFORMED: 1. Scleral buckle and retinal cryopexy to the left eye with segmental 287    explant, superotemporal quadrant of the left eye. 2. Injection of vitreous substitute--C3F8 100% concentration 0.2 cc volume. 3. External drain of subretinal fluid, left eye.  SURGEON:  Ernesto Rutherford, M.D.  ANESTHESIA:  General endotracheal anesthesia.  INDICATIONS FOR PROCEDURE:  The patient is a 65 year old man with spontaneous visual loss in the left eye on the basis of rhegmatogenous retinal detachment with a large horseshoe retinal tear at the 2 oclock position. This is an attempt to reattach the retina. The patient understands the risks of anesthesia, including the rare occurrence of death, but also to the eye, including hemorrhage, infection, scarring, need for other surgery, no change in vision, loss of vision, and progressive disease despite intervention.  DESCRIPTION OF THE PROCEDURE:  After appropriate signed consent was obtained the patient was taken to the operating room. In the operating room, appropriate monitoring was followed by mild sedation. Then 0.75% Marcaine was delivered 5 cc retrobulbar followed by an additional 5 cc laterally in the fashion of modified Darel Hong. The left periocular region was then sterilely prepped and draped in the usual ophthalmic fashion.  The  lid speculum was applied. A conjunctival peritomy was fashioned from the 10 oclock position to the 4 oclock position and then the superior and lateral rectus muscles isolated on 2-0 silk ties. Indirect ophthalmoscopy was then used to direct retinal cryopexy to the edge of the retinal flap tear.  At this time, a 287 solid silicone explant was selected and placed into position in the superotemporal quadrant with two horizontal mattress 5-0 Mersilene sutures. Excellent scleral indentation was obtained. These were tied temporarily.  The subretinal fluid was aspirated passively with a 26-gauge needle via a 3 cc hub syringe. At this time, the retina settled nicely on the buckle. The buckle was tied permanently. There was some fishmouthing to the break, and for this reason the decision was made to use an intravitreal substitute to flatten the break overlying the buckle.  Then C3F8 100% concentration, 0.2 cc was then injected via the pars plana and the inferonasal quadrant into the vitreous cavity. Intraocular pressure was assessed and found to be slightly elevated and thus an aqueous paracentesis was fashioned with a 30-gauge needle.  Intraocular pressure was assessed and found to be adequate. The optic nerve was perfused. The bed of the buckle was irrigated with bug juice, polymyxin and gentamicin.  At this time, the conjunctiva and Tenons were then closed interrupted and in layers of 7-0 Vicryl suture. A subconjunctival injection of antibiotics was applied. The patient tolerated the procedure without complications. Dictated by:   Ernesto Rutherford, M.D. Attending Physician:  Ernesto Rutherford DD:  11/02/01 TD:  11/04/01 Job: [id number]

## 2010-09-20 NOTE — H&P (Signed)
NAME:  Marc Mills, Marc Mills                      ACCOUNT NO.:  [id number]   MEDICAL RECORD NO.:  [id number]                   PATIENT TYPE:  OIB   LOCATION:  2864                                 FACILITY:  [facility]   PHYSICIAN:  Guadelupe Sabin, M.D.             DATE OF BIRTH:  [birth year]   DATE OF ADMISSION:  05/31/2002  DATE OF DISCHARGE:                                HISTORY & PHYSICAL   REASON FOR ADMISSION:  This was a planned outpatient surgical admission of  this 65 year old white male admitted for cataract implant surgery of the  left eye.   HISTORY OF PRESENT ILLNESS:  This patient has a long history of high myopia  in both eyes.  A retinal detachment developed in the left eye and the  patient was seen by Dr. Fawn Kirk.  The patient had retinal detachment  surgery by Dr. Luciana Axe.  Following this, the patient had an initial  pneumocryopexy followed by a giant tear retinal detachment, necessitating  scleral buckling.  Postoperatively, the patient did well with return of  vision to 20/40.  Nuclear cataract formation, however, developed and the  patient now has requested cataract implant surgery.  The patient was  referred to my office where this diagnosis was confirmed and arrangements  made for outpatient admission at this time.   PAST MEDICAL HISTORY:  The patient is under the care of Dr. Viviann Spare Dobb and  also Dr. Jetty Duhamel.  The patient has been seen by Dr. Maple Hudson  preoperatively and is felt to be in satisfactory condition for the proposed  surgery.  Dr. Maple Hudson has agreed that the patient could come off of his  chronic Coumadin administration for previous pulmonary infarction for a few  days one week prior to admission.  Other current medications include  Prevacid, Vioxx and Celexa.  The patient is noted to have degenerative  arthritis, pulmonary infarction in 10/02, and clinical depression.   REVIEW OF SYSTEMS:  No current cardiorespiratory complaints.   PHYSICAL  EXAMINATION:  GENERAL:  The patient is a pleasant 65 year old white  male in no acute distress.  HEENT:  Eyes, visual acuity without correction, finger counting right eye,  finger counting left eye.  With correction, 20/30 +2 right eye, 20/40 left  eye.  applanation tonometry 20 mm right eye, 20 left eye.  Slit-lamp  examination, the eyes are white and clear with a clear cornea deepened  anterior chamber, a slight lens nuclear sclerosis is noted in the right eye  and nuclear and posterior subcapsular cataract in the left eye.  Pupil exams  and ocular motility normal.  Detailed fundus examination right eye shows  high myopic changes with inferior peripheral atrophic areas, no retinal  tears or detachment areas are seen.  The left eye shows a peripheral scleral  buckling indentation, the retina is attached.  CHEST:  Lungs clear to percussion and auscultation.  HEART:  Normal sinus rhythm, no  cardiomegaly, no murmurs.  ABDOMEN:  Negative.  EXTREMITIES:  Negative.    ADMISSION DATA:  Nuclear and posterior subcapsular cataract, left eye,  status post scleral buckling retinal detachment surgery, left eye.   SURGICAL PLAN:  Cataract implant surgery, left eye.                                                Guadelupe Sabin, M.D.    HNJ/MEDQ  D:  05/31/2002  T:  05/31/2002  Job:  [id number]   cc:   Joni Fears D. Young, M.D.  [address] [place]  [place] [address]  Fax: [phone]   Alford Highland. Rankin, M.D.  [address]  [place]  [place] [address]  Fax: [phone]

## 2010-09-20 NOTE — H&P (Signed)
NAME:  Marc Mills, Marc Mills                      ACCOUNT NO.:  [id number]   MEDICAL RECORD NO.:  [id number]                   PATIENT TYPE:  OIB   LOCATION:  2864                                 FACILITY:  [facility]   PHYSICIAN:  Guadelupe Sabin, M.D.             DATE OF BIRTH:  February 12, 1946   DATE OF ADMISSION:  05/31/2002  DATE OF DISCHARGE:                                HISTORY & PHYSICAL   PREOPERATIVE DIAGNOSIS:  Nuclear and posterior subcapsular cataract, left  eye, status post scleral buckling procedure of retinal detachment, left eye.   POSTOPERATIVE DIAGNOSIS:  Nuclear and posterior subcapsular cataract, left  eye, status post scleral buckling procedure of retinal detachment, left eye.   OPERATION:  Planned extracapsular cataract extraction - phacoemulsification,  primary insertion of posterior chamber intraocular lens implant.   SURGEON:  Guadelupe Sabin, M.D.   ASSISTANT:  Nurse.   ANESTHESIA:  Local, 4% Xylocaine, 0.75% Marcaine retrobulbar block, topical  tetracaine, intraocular Xylocaine.  Anesthesia standby required, patient  given sodium Pentothal intravenously during the period of retrobulbar  injection.   DESCRIPTION OF PROCEDURE:  After the patient was prepped and draped, a lid  speculum was inserted in the left eye.  The eye was turned downward and a  superior rectus traction suture placed.  Schiotz tonometry was recorded at 8  skill units with a 5.5 gram weight.  A peritomy was performed adjacent to  the limbus from the 11 to 1 o'clock position.  The corneoscleral junction  was cleaned and the corneoscleral groove made with a 45 degree Superblade.  The anterior chamber was then entered with the 2.5 mm diamond keratome at  the 12 o'clock position and a 15 degree blade at the 2:30 position.  Using a  bent 26 gauge needle and a Healon syringe, a circular capsulorrhexis was  begun and then completed with the Grabow forceps.  Hydrodissection and  hydrodelineation  were performed using 1% Xylocaine.  The 30 degree  phacoemulsification tip was then inserted with slow controlled  emulsification of the lens nucleus.  Total ultrasonic time 1 minute 19  seconds, average power level 14%, total amount of fluid used 100 cc.  Following removal of the lens nucleus, the residual cortex was aspirated  with the irrigation aspiration __________.  The posterior capsule appeared  intact with a brilliant red fundus reflex.  It was therefore elected to  insert to an [employer] Optic [id number] silicone three piece posterior  chamber intraocular lens implant, diopter strength +14.00.  This was  inserted with the MacDonald forceps into the anterior chamber and then  centered into the capsular bag using the [facility] lens rotator.  The lens  appeared to be well centered.  The Healon, which had been used during the  procedure, was aspirated and replaced with balanced salt solution and  Miochol ophthalmic solution.  The operative incisions appeared to be self-  sealing and no sutures were  required.  Maxitrol ointment was instilled in  the conjunctival cul-de-sac and a light patch and protective shield applied.  The duration of procedure and anesthesia administration, 45 minutes.  The  patient tolerated the procedure well in general and left the operating room  for the recovery room in good condition.                                              Guadelupe Sabin, M.D.   HNJ/MEDQ  D:  05/31/2002  T:  05/31/2002  Job:  [id number]

## 2010-09-20 NOTE — Discharge Summary (Signed)
[place]. [facility]  Patient:    Marc Mills, Marc Mills Visit Number: [id number] MRN: [id number]          Service Type: MED Location: [phone] Attending Physician:  Marc Mills Admit Date:  08/65/7846 Discharge Date: 02/25/2001   CC:         Marc Mills, M.D., [facility]             Marc Mills. Marc Mills, M.D., [facility]                           Discharge Summary  DISCHARGE DIAGNOSES: 1. Acute pulmonary embolism. 2. Previous history of pulmonary embolism. 3. Transient thrombocytopenia on heparin. 4. Morbid obesity. 5. Obstructive sleep apnea. 6. Peripheral venous insufficiency. 7. Superficial vein thrombosis. 8. Anticoagulation therapy.  BRIEF HISTORY:  This is a 65 year old man admitted with acute pulmonary embolism by CT scan, leg veins negative for DVT by Doppler but with a superficial thrombus in the calf and chronic varices.  He had been on Coumadin therapy several years ago for previous pulmonary embolism.  History of the current event included recent seven hour car ride repeated two days later.  He was symptomatic with increased dyspnea on exertion without chest pain.  HOSPITAL COURSE:  He was begun on heparin therapy by pharmacy protocol then begun on Coumadin and maintained for 3 day therapeutic overlap.  Hospital course was comfortable and uncomplicated.  At the time of admission, he was noted to have T wave inversion and cardiac enzymes.  He had previously been followed by Dr. Tresa Endo for cardiology, and Dr. Ellin Goodie group was consulted for evaluation with the opinion that his changes reflected his acute pulmonary embolism rather than new ischemic disease.  A cardiac catheterization in October 2000 had no shown no significant coronary disease and normal left ventricular function.  He has a previous history of obstructive sleep apnea, and he used his CPAP while sleeping through this admission  with no problems. While on heparin, his platelet count dropped from 148,000 to 112,000 before rebounding.  He was watched for heparin-induced thrombocytopenia but did not have to have a change in therapeutic strategy.  He received Coumadin therapy counseling.  He was discharged much improved.  Room air oxygen saturation had improved from 92% on 2 liters initially to 97% on room air by discharge with BP 115/70, platelet count 143,000.  He received careful instruction and education related to avoidance of stasis, management of risks of bleeding while on anticoagulation therapy, and long-term follow-up.  LABORATORY:  Doppler studies of leg veins showed a left superficial thrombus in the medial aspect of the left calf noted previously in August 2002 and superficial thrombus and varices in the popliteal fossa of the right leg but no evidence of DVT, superficial thrombosis, or Bakers cyst otherwise.  EKG initially showed sinus rhythm with incomplete bundle branch block and remained stable with T wave inversion in V1 through V4.  CT scan of the chest with contrast showed large bilateral pulmonary emboli, no acute pulmonary findings. No definite filling defect in the deep venous structures of the legs was seen on CT.  His previous pulmonary embolism had been diagnosed by ventilation/perfusion scans.  There was not a comparison.  Initial chest x-ray showed borderline cardiac enlargement and central vascular congestion without effusion, infiltrate, or edema.  Initial arterial blood gas showed a pH of 7.389, PCO2 47, bicarbonate of  29 without PO2 by i-STAT.  An ABG on 2 liters on October 17 showed pH 7.44, PCO2 36, PO2 82, bicarbonate 25.  CBC was unremarkable except that white blood count initially was 148,000 falling to 112,000 before rebounding to 143,000 by discharge.  He will need to be watched in the future for the possibility of heparin-induced thrombocytopenia. D-dimer was elevated at 2.69  (0-0.48).  Coagulation studies were followed by protocol.  At this dictation, factor V Leiden mutation was still pending, Cardiolite and antibodies, IgM were inconclusive at 14.  IgG moderately positive at 25.  ANA titer 80 with homogenous pattern.  Prothrombin gene mutation still pending.  Electrolytes, renal, and liver enzymes were normal. Cardiac enzymes showed initial CK of 151, 121, 104, CK-MB 4.9, 4.1, 4.2. Index 3.2, 3.4, 4.0.  Troponin I 0.23, 0.14, 0.10 interpreted as strain changes related to the pulmonary embolism.  DISCHARGE PLANS:  Coumadin diet, elevation of legs with avoidance of stasis, laboratory for prothrombin time each Monday and Thursday at the [facility]  Office follow-up with Dr. Maple Hudson in three weeks and with Dr. Tresa Endo and with Dr. Earl Lites at [facility] as needed.  DISCHARGE MEDICATIONS: 1. Coumadin 2.5 mg q.d. except 5 mg each Monday, Wednesday, Friday. 2. Prevacid 30 mg q.d. 3. Aspirin 81 mg q.d. 4. Celexa home dose, once daily. 5. Vioxx 25 mg q.d. p.r.n. for arthritis. Attending Physician:  Marc Mills DD:  16/10/96 TD:  03/13/01 Job: [id number]

## 2010-09-20 NOTE — Op Note (Signed)
. [facility]  Patient:    Marc Mills, Marc Mills Visit Number: [id number] MRN: [id number]          Service Type: OBV Location: 5700 [id number] Attending Physician:  Ernesto Rutherford Dictated by:   Ernesto Rutherford, M.D. Proc. Date: 11/03/01 Admit Date:  11/02/2001 Discharge Date: 11/04/2001                             Operative Report  PREOPERATIVE DIAGNOSES: 1. Progression of retinal detachment to the left eye with new retinal break    and extension of retinal detachment superiorly and superonasally in this    left eye, with involvement of the previous detachment in the superotemporal    and temporal quadrant of the left eye, one day status post attempted repair    with scleral buckle and injection of vitreous substitute, left eye. 2. Proliferative vitreoretinopathy, left eye. 3. Nuclear sclerotic and posterior subcapsular cataract in this young man with    this retinal detachment.  POSTOPERATIVE DIAGNOSES: 1. Progression of retinal detachment to the left eye with new retinal break    and extension of retinal detachment superiorly and superonasally in this    left eye, with involvement of the previous detachment in the superotemporal    and temporal quadrant of the left eye, one day status post attempted repair    with scleral buckle and injection of vitreous substitute, left eye. 2. Proliferative vitreoretinopathy, left eye. 3. Nuclear sclerotic and posterior subcapsular cataract in this young man with    this retinal detachment.  PROCEDURES: 1. Posterior vitrectomy with membrane peel of the epiretinal tissues and    vitreous skirt contributing to vitreous collapse and a large 1-1/2 clock    hour break at the 12 to 1:30 position adjacent to the previous 2 to    2:30 position break. 2. Endolaser panretinal photocoagulation in retinopexy fashion. 3. Injection of vitreous substitute, temporary, Perfluoron. 4. Injection of vitreous substitute,  C3F8 12%, left eye. 5. Revision of scleral buckle, left eye, with placement of 287 solid silicone    explant in the superotemporal quadrant, with higher extent of the elevation    as well as placement of the band 240, 360 degrees, with a Watzke sleeve in    the inferonasal quadrant.  Two Mersilene sutures used in the superotemporal    quadrant, one each in the other quadrants.  INDICATION FOR PROCEDURE:  The patient is a 65 year old man one day status post attempted retinal detachment repair, who had developed further progression of vitreous collapse, vitreous traction despite segmental buckle and an injection of vitreous substitute to flatten the retina.  This patient has developed a new retinal break with extensive retinal detachment both superiorly, superonasally, and temporally and inferotemporally.  The detachment now extends from the 11 oclock position down to the 4:30 position with new retinal break encompassing the previous retinal break as well.  This is an attempt to reattach the retina.  The patient understands the need for urgent intervention because the previous surgery would not be sufficient for the new pathology.  The patients family understands the risks of anesthesia, including the rare occurrence of death, and loss to the eye, including hemorrhage, infection, scarring, need for further surgery, no change in vision, loss of vision, and progression of disease despite intervention. An appropriate signed consent was obtained.  DESCRIPTION OF PROCEDURE:  The patient was taken to the operating room.  In the operating room appropriate monitoring was followed by mild sedation. Marcaine 0.75% delivered 5 cc retrobulbar followed by an additional 5 cc laterally in the fashion of modified Darel Hong.  The left periocular region was sterilely prepped and draped in the usual ophthalmic fashion.  A lid speculum applied.  Conjunctival peritomy fashioned 360 degrees using the  previous incision sites inferotemporally and superonasally.  The rectus muscles x4 were isolated on 2-0 silk ties.  The previous buckle was exposed.  It was necessary to remove this for completion of the procedure.  The previous Mersilene mattress sutures were removed.  The same buckle, however, was used with an encircling band placed 360 degrees and tied inferonasally in a temporary fashion.  Mersilene 5-0 mattress sutures were then used, two in the superotemporal quadrant and one in each of the remaining quadrants. Appropriate height was obtained.  Indirect ophthalmoscopy was then performed. At this time performance of the vitrectomy was undertaken.  A 4 mm infusion was secured 4 mm posterior to the limbus in the inferotemporal quadrant. Placement in the vitreous cavity verified visually.  A superior sclerotomy was then fashioned.  The wall microscope placed in position with BIOM attached. Core vitrectomy was then begun and previous gas was aspirated from the vitreous cavity.  The retina began to flatten immediately with the infusion of pushing the subretinal fluid out of the subretinal space.  Nonetheless, there was some subretinal fluid posterior to the buckle.  This could not be drained without creating a retinotomy.  For this reason, temporary vitreous substitute was used.  Vitreous skirt had been trimmed 360 degrees.  The posterior hyaloid had been spontaneously engaged and removed. Perfluoron was then placed and the fluid posterior to the break was rolled into the opening of the break.  A fluid-air exchange was then carried out over the Perfluoron, and all subretinal fluid was then removed in a peripheral fashion in this way.  Endolaser photocoagulation was then carried out 360 degrees and around the edges of the break.  It must be said that the retinal cryopexy had been applied as well prior to the fluid-air exchange with the retina only shallowly detached.  The remaining  subretinal fluid was aspirated under the fluid-air exchange.  At this time the remainder of the Perfluoron was aspirated directly and the retina remained attached.  At this time  completion of the Endolaser photocoagulation 360 degrees was completed.  The instruments were removed from the eye.  Superior sclerotomies closed with 7-0 Vicryl suture.  An air-C3F8 12% exchange was completed.  The infusion was removed and similarly closed with 7-0 Vicryl suture.  The bed of the buckle was irrigated with bug juice.  The conjunctiva was then brought forward with Tenons and Tenons was closed in layers to the origins onto the superior lateral rectus and the medial rectus insertions.  Thereafter the conjunctiva was then closed with interrupted 7-0 Vicryl sutures and tacked down to the limbus.  Subconjunctival injection of antibiotic and steroid were applied. The intraocular pressure was assessed and found to be adequate.  A sterile patch and Fox shield applied in the left eye.  Antibiotics and steroid had been injected subconjunctivally.  The patient awakened from anesthesia without difficulty and tolerated the procedure well without complication.  Taken to the recovery room. Dictated by:   Ernesto Rutherford, M.D. Attending Physician:  Ernesto Rutherford DD:  11/03/01 TD:  11/07/01 Job: [id number]

## 2010-09-20 NOTE — Op Note (Signed)
NAME:  Marc Mills, Marc Mills            ACCOUNT NO.:  [id number]   MEDICAL RECORD NO.:  [id number]          PATIENT TYPE:  AMB   LOCATION:  DAY                          FACILITY:  [facility]   PHYSICIAN:  Ollen Gross. Vernell Morgans, M.D. DATE OF BIRTH:  26-Mar-1946   DATE OF PROCEDURE:  10/25/2004  DATE OF DISCHARGE:  10/25/2004                                 OPERATIVE REPORT   PREOPERATIVE DIAGNOSIS:  Right inguinal hernia.   POSTOPERATIVE DIAGNOSES:  Right direct and indirect inguinal hernia.   PROCEDURE:  Right inguinal hernia repair with mesh.   SURGEON:  Ollen Gross. Carolynne Edouard, M.D.   ANESTHESIA:  General endotracheal.   PROCEDURE:  After informed consent was obtained, the patient was brought to  the operating and placed in supine position on the operating room table.  After adequate induction of general anesthesia, the patient's abdomen and  right groin were prepped with Betadine and draped in usual sterile manner.  The right groin was then infiltrated 0.25% Marcaine with epinephrine.  A  small incision was made from the edge of the pubic tubercle on the right  towards the anterior spine for a distance of about 6 cm.  This incision was  carried down through the skin and subcutaneous tissue sharply with  electrocautery.  A small bridging vein was encountered that was clamped with  hemostats, divided and ligated with 3-0 silk ties.  The dissection was then  carried through the subcutaneous tissue sharply with electrocautery until  the fascia of the external oblique was encountered.  The patient has a very  deep fatty abdominal wall, and a Balfour retractor was used to retract the  sidewalls of the wound.  The fascia of the external oblique was opened along  its fibers towards apex of the external ring and using a 15 blade knife and  Metzenbaum scissors blunt dissection was then carried out at the edge of the  pubic tubercle until the cord structures could be surrounded between two  fingers.  A 1/2-inch  Penrose drain was then placed around the cord  structures for retraction purposes.  Initially, a bulging defect at the  floor of the inguinal canal was noted consistent with a direct hernia.  This  was fairly broad based in the contents of the sac but would not slide  separate from the sac wall.  This  hernia was therefore reduced back into  normal position, and the floor of the inguinal canal was reamed was  reinforced with interrupted 0 silk stitches.  The cord structures were then  and gently skeletonized by a combination of blunt hemostat dissection and  sharp dissection with electrocautery.  A small hernia sac was identified and  was gently  separated from the rest of the cord structures.  The sac was  then opened.  There were no visceral contents within the sac.  The sac was  ligated near its base with a 2-0 silk suture ligature, and the rest of the  sac was sent for evaluation to pathology.  Once this was accomplished, a  piece of 3 x 6 mesh was chosen and cut  to fit.  The mesh was sewed  inferiorly to the shelving edge of inguinal ligament using a running 2-0  Prolene stitch. Tails were cut in the mesh laterally and the tails were  wrapped around the cord structures and anchored laterally to the shelving  edge of the inguinal ligament with an interrupted 2-0 Prolene stitch  superiorly.  The mesh was anchored to the muscular aponeurotic strength  layer of the transversalis using interrupted vertical mattress 2-0 Prolene  stitches.  Once  this was accomplished, the mesh was in good position and  without any tension.  The  wound was irrigated copious amounts of saline.  The fascia of the external oblique was then reapproximated with a running 2-  0 Vicryl stitch.  The wound was infiltrated with 0.25% Marcaine.  The  subcutaneous fascia was closed with a running 3-0 Vicryl stitch and the skin  was closed with  running for Monocryl subcuticular stitch.  Benzoin, Steri-Strips and  sterile  dressings were applied.  The patient tolerated well.  At the end of the  case, all needle, sponge and instrument counts were correct.  The patient  was then awakened and taken recovery in stable condition.       PST/MEDQ  D:  10/28/2004  T:  10/28/2004  Job:  [id number]
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
## 2010-09-24 ENCOUNTER — Ambulatory Visit (INDEPENDENT_AMBULATORY_CARE_PROVIDER_SITE_OTHER): Payer: BC Managed Care – PPO | Admitting: *Deleted

## 2010-09-24 DIAGNOSIS — I2699 Other pulmonary embolism without acute cor pulmonale: Secondary | ICD-10-CM

## 2010-09-24 LAB — POCT INR: INR: 3.3

## 2010-10-08 ENCOUNTER — Encounter (HOSPITAL_BASED_OUTPATIENT_CLINIC_OR_DEPARTMENT_OTHER): Payer: BC Managed Care – PPO | Admitting: Oncology

## 2010-10-08 ENCOUNTER — Other Ambulatory Visit: Payer: Self-pay | Admitting: Oncology

## 2010-10-08 DIAGNOSIS — C8587 Other specified types of non-Hodgkin lymphoma, spleen: Secondary | ICD-10-CM

## 2010-10-08 LAB — CBC WITH DIFFERENTIAL/PLATELET
BASO%: 0.4 % (ref 0.0–2.0)
Basophils Absolute: 0 10*3/uL (ref 0.0–0.1)
EOS%: 8.7 % — ABNORMAL HIGH (ref 0.0–7.0)
Eosinophils Absolute: 0.5 10*3/uL (ref 0.0–0.5)
HCT: 41 % (ref 38.4–49.9)
HGB: 13.8 g/dL (ref 13.0–17.1)
LYMPH%: 13.7 % — ABNORMAL LOW (ref 14.0–49.0)
MCH: 30.4 pg (ref 27.2–33.4)
MCHC: 33.6 g/dL (ref 32.0–36.0)
MCV: 90.3 fL (ref 79.3–98.0)
MONO#: 0.8 10*3/uL (ref 0.1–0.9)
MONO%: 12.3 % (ref 0.0–14.0)
NEUT#: 4.1 10*3/uL (ref 1.5–6.5)
NEUT%: 64.9 % (ref 39.0–75.0)
Platelets: 257 10*3/uL (ref 140–400)
RBC: 4.54 10*6/uL (ref 4.20–5.82)
RDW: 14.9 % — ABNORMAL HIGH (ref 11.0–14.6)
WBC: 6.3 10*3/uL (ref 4.0–10.3)
lymph#: 0.9 10*3/uL (ref 0.9–3.3)

## 2010-10-09 LAB — COMPREHENSIVE METABOLIC PANEL
ALT: 18 U/L (ref 0–53)
AST: 16 U/L (ref 0–37)
Albumin: 4.3 g/dL (ref 3.5–5.2)
Alkaline Phosphatase: 58 U/L (ref 39–117)
BUN: 17 mg/dL (ref 6–23)
CO2: 31 mEq/L (ref 19–32)
Calcium: 8.9 mg/dL (ref 8.4–10.5)
Chloride: 103 mEq/L (ref 96–112)
Creatinine, Ser: 0.7 mg/dL (ref 0.50–1.35)
Glucose, Bld: 100 mg/dL — ABNORMAL HIGH (ref 70–99)
Potassium: 4.6 mEq/L (ref 3.5–5.3)
Sodium: 137 mEq/L (ref 135–145)
Total Bilirubin: 0.4 mg/dL (ref 0.3–1.2)
Total Protein: 6.2 g/dL (ref 6.0–8.3)

## 2010-10-09 LAB — LACTATE DEHYDROGENASE: LDH: 91 U/L — ABNORMAL LOW (ref 94–250)

## 2010-10-09 LAB — URIC ACID: Uric Acid, Serum: 5.6 mg/dL (ref 4.0–7.8)

## 2010-10-09 LAB — BETA 2 MICROGLOBULIN, SERUM: Beta-2 Microglobulin: 1.85 mg/L — ABNORMAL HIGH (ref 1.01–1.73)

## 2010-10-22 ENCOUNTER — Ambulatory Visit (INDEPENDENT_AMBULATORY_CARE_PROVIDER_SITE_OTHER): Payer: BC Managed Care – PPO | Admitting: *Deleted

## 2010-10-22 DIAGNOSIS — I2699 Other pulmonary embolism without acute cor pulmonale: Secondary | ICD-10-CM

## 2010-10-22 LAB — POCT INR: INR: 2.3

## 2010-11-15 ENCOUNTER — Ambulatory Visit (INDEPENDENT_AMBULATORY_CARE_PROVIDER_SITE_OTHER): Payer: BC Managed Care – PPO | Admitting: *Deleted

## 2010-11-15 DIAGNOSIS — I2699 Other pulmonary embolism without acute cor pulmonale: Secondary | ICD-10-CM

## 2010-11-15 LAB — POCT INR: INR: 2.7

## 2010-11-18 ENCOUNTER — Other Ambulatory Visit: Payer: Self-pay | Admitting: Oncology

## 2010-11-18 ENCOUNTER — Encounter (HOSPITAL_COMMUNITY)
Admission: RE | Admit: 2010-11-18 | Discharge: 2010-11-18 | Disposition: A | Discharge status: Home / Self Care | Payer: BC Managed Care – PPO | Source: Ambulatory Visit | Attending: Oncology | Admitting: Oncology

## 2010-11-18 ENCOUNTER — Encounter (HOSPITAL_BASED_OUTPATIENT_CLINIC_OR_DEPARTMENT_OTHER): Payer: BC Managed Care – PPO | Admitting: Oncology

## 2010-11-18 ENCOUNTER — Encounter (HOSPITAL_COMMUNITY): Payer: Self-pay

## 2010-11-18 DIAGNOSIS — K4020 Bilateral inguinal hernia, without obstruction or gangrene, not specified as recurrent: Secondary | ICD-10-CM | POA: Insufficient documentation

## 2010-11-18 DIAGNOSIS — K5730 Diverticulosis of large intestine without perforation or abscess without bleeding: Secondary | ICD-10-CM | POA: Insufficient documentation

## 2010-11-18 DIAGNOSIS — C8587 Other specified types of non-Hodgkin lymphoma, spleen: Secondary | ICD-10-CM

## 2010-11-18 DIAGNOSIS — C8589 Other specified types of non-Hodgkin lymphoma, extranodal and solid organ sites: Secondary | ICD-10-CM | POA: Insufficient documentation

## 2010-11-18 DIAGNOSIS — Q619 Cystic kidney disease, unspecified: Secondary | ICD-10-CM | POA: Insufficient documentation

## 2010-11-18 DIAGNOSIS — K8020 Calculus of gallbladder without cholecystitis without obstruction: Secondary | ICD-10-CM | POA: Insufficient documentation

## 2010-11-18 DIAGNOSIS — Z9089 Acquired absence of other organs: Secondary | ICD-10-CM | POA: Insufficient documentation

## 2010-11-18 DIAGNOSIS — C8590 Non-Hodgkin lymphoma, unspecified, unspecified site: Secondary | ICD-10-CM

## 2010-11-18 LAB — CBC WITH DIFFERENTIAL/PLATELET
BASO%: 0.2 % (ref 0.0–2.0)
Basophils Absolute: 0 10*3/uL (ref 0.0–0.1)
EOS%: 8.6 % — ABNORMAL HIGH (ref 0.0–7.0)
Eosinophils Absolute: 0.5 10*3/uL (ref 0.0–0.5)
HCT: 41.8 % (ref 38.4–49.9)
HGB: 14.3 g/dL (ref 13.0–17.1)
LYMPH%: 13.6 % — ABNORMAL LOW (ref 14.0–49.0)
MCH: 30.9 pg (ref 27.2–33.4)
MCHC: 34.2 g/dL (ref 32.0–36.0)
MCV: 90.6 fL (ref 79.3–98.0)
MONO#: 0.3 10*3/uL (ref 0.1–0.9)
MONO%: 5.3 % (ref 0.0–14.0)
NEUT#: 4.1 10*3/uL (ref 1.5–6.5)
NEUT%: 72.3 % (ref 39.0–75.0)
Platelets: 284 10*3/uL (ref 140–400)
RBC: 4.61 10*6/uL (ref 4.20–5.82)
RDW: 14.6 % (ref 11.0–14.6)
WBC: 5.6 10*3/uL (ref 4.0–10.3)
lymph#: 0.8 10*3/uL — ABNORMAL LOW (ref 0.9–3.3)

## 2010-11-18 LAB — GLUCOSE, CAPILLARY: Glucose-Capillary: 101 mg/dL — ABNORMAL HIGH (ref 70–99)

## 2010-11-18 MED ORDER — FLUDEOXYGLUCOSE F - 18 (FDG) INJECTION
17.0000 | Freq: Once | INTRAVENOUS | Status: AC | PRN
  Administered 2010-11-18: 17 via INTRAVENOUS

## 2010-11-19 LAB — BETA 2 MICROGLOBULIN, SERUM: Beta-2 Microglobulin: 1.42 mg/L (ref 1.01–1.73)

## 2010-11-19 LAB — COMPREHENSIVE METABOLIC PANEL
ALT: 18 U/L (ref 0–53)
AST: 18 U/L (ref 0–37)
Albumin: 4.2 g/dL (ref 3.5–5.2)
Alkaline Phosphatase: 55 U/L (ref 39–117)
BUN: 15 mg/dL (ref 6–23)
CO2: 28 mEq/L (ref 19–32)
Calcium: 9.1 mg/dL (ref 8.4–10.5)
Chloride: 104 mEq/L (ref 96–112)
Creatinine, Ser: 0.7 mg/dL (ref 0.50–1.35)
Glucose, Bld: 99 mg/dL (ref 70–99)
Potassium: 4.5 mEq/L (ref 3.5–5.3)
Sodium: 140 mEq/L (ref 135–145)
Total Bilirubin: 0.5 mg/dL (ref 0.3–1.2)
Total Protein: 6.6 g/dL (ref 6.0–8.3)

## 2010-11-19 LAB — URIC ACID: Uric Acid, Serum: 5 mg/dL (ref 4.0–7.8)

## 2010-11-19 LAB — LACTATE DEHYDROGENASE: LDH: 92 U/L — ABNORMAL LOW (ref 94–250)

## 2010-11-25 ENCOUNTER — Encounter (HOSPITAL_BASED_OUTPATIENT_CLINIC_OR_DEPARTMENT_OTHER): Payer: BC Managed Care – PPO | Admitting: Oncology

## 2010-11-25 DIAGNOSIS — C8587 Other specified types of non-Hodgkin lymphoma, spleen: Secondary | ICD-10-CM

## 2010-12-04 ENCOUNTER — Other Ambulatory Visit (HOSPITAL_COMMUNITY): Payer: BC Managed Care – PPO

## 2010-12-13 ENCOUNTER — Ambulatory Visit (INDEPENDENT_AMBULATORY_CARE_PROVIDER_SITE_OTHER): Payer: Medicare Other | Admitting: *Deleted

## 2010-12-13 DIAGNOSIS — I2699 Other pulmonary embolism without acute cor pulmonale: Secondary | ICD-10-CM

## 2010-12-13 LAB — POCT INR: INR: 3.3

## 2011-01-15 ENCOUNTER — Ambulatory Visit (INDEPENDENT_AMBULATORY_CARE_PROVIDER_SITE_OTHER): Payer: Medicare Other | Admitting: *Deleted

## 2011-01-15 DIAGNOSIS — I2699 Other pulmonary embolism without acute cor pulmonale: Secondary | ICD-10-CM

## 2011-01-15 LAB — POCT INR: INR: 2.9

## 2011-01-27 ENCOUNTER — Other Ambulatory Visit: Payer: Self-pay | Admitting: Oncology

## 2011-01-27 ENCOUNTER — Encounter (HOSPITAL_BASED_OUTPATIENT_CLINIC_OR_DEPARTMENT_OTHER): Payer: Medicare Other | Admitting: Oncology

## 2011-01-27 DIAGNOSIS — C8587 Other specified types of non-Hodgkin lymphoma, spleen: Secondary | ICD-10-CM

## 2011-01-27 LAB — CBC WITH DIFFERENTIAL/PLATELET
BASO%: 0.8 % (ref 0.0–2.0)
Basophils Absolute: 0.1 10*3/uL (ref 0.0–0.1)
EOS%: 5.5 % (ref 0.0–7.0)
Eosinophils Absolute: 0.4 10*3/uL (ref 0.0–0.5)
HCT: 42.6 % (ref 38.4–49.9)
HGB: 14.4 g/dL (ref 13.0–17.1)
LYMPH%: 12.9 % — ABNORMAL LOW (ref 14.0–49.0)
MCH: 30.9 pg (ref 27.2–33.4)
MCHC: 33.9 g/dL (ref 32.0–36.0)
MCV: 91.1 fL (ref 79.3–98.0)
MONO#: 0.8 10*3/uL (ref 0.1–0.9)
MONO%: 11.8 % (ref 0.0–14.0)
NEUT#: 4.5 10*3/uL (ref 1.5–6.5)
NEUT%: 69 % (ref 39.0–75.0)
Platelets: 303 10*3/uL (ref 140–400)
RBC: 4.67 10*6/uL (ref 4.20–5.82)
RDW: 15.5 % — ABNORMAL HIGH (ref 11.0–14.6)
WBC: 6.6 10*3/uL (ref 4.0–10.3)
lymph#: 0.8 10*3/uL — ABNORMAL LOW (ref 0.9–3.3)

## 2011-01-28 LAB — COMPREHENSIVE METABOLIC PANEL
ALT: 18 U/L (ref 0–53)
AST: 18 U/L (ref 0–37)
Albumin: 4.2 g/dL (ref 3.5–5.2)
Alkaline Phosphatase: 61 U/L (ref 39–117)
BUN: 13 mg/dL (ref 6–23)
CO2: 29 mEq/L (ref 19–32)
Calcium: 9.2 mg/dL (ref 8.4–10.5)
Chloride: 106 mEq/L (ref 96–112)
Creatinine, Ser: 0.69 mg/dL (ref 0.50–1.35)
Glucose, Bld: 94 mg/dL (ref 70–99)
Potassium: 4.6 mEq/L (ref 3.5–5.3)
Sodium: 139 mEq/L (ref 135–145)
Total Bilirubin: 0.4 mg/dL (ref 0.3–1.2)
Total Protein: 6.7 g/dL (ref 6.0–8.3)

## 2011-01-28 LAB — URIC ACID: Uric Acid, Serum: 4.9 mg/dL (ref 4.0–7.8)

## 2011-01-28 LAB — BETA 2 MICROGLOBULIN, SERUM: Beta-2 Microglobulin: 1.71 mg/L (ref 1.01–1.73)

## 2011-01-28 LAB — LACTATE DEHYDROGENASE: LDH: 96 U/L (ref 94–250)

## 2011-02-10 ENCOUNTER — Other Ambulatory Visit: Payer: Self-pay | Admitting: Orthopaedic Surgery

## 2011-02-10 DIAGNOSIS — M25531 Pain in right wrist: Secondary | ICD-10-CM

## 2011-02-12 ENCOUNTER — Ambulatory Visit (INDEPENDENT_AMBULATORY_CARE_PROVIDER_SITE_OTHER): Payer: Medicare Other | Admitting: *Deleted

## 2011-02-12 DIAGNOSIS — I2699 Other pulmonary embolism without acute cor pulmonale: Secondary | ICD-10-CM

## 2011-02-12 LAB — POCT INR: INR: 2.8

## 2011-02-14 ENCOUNTER — Telehealth: Payer: Self-pay | Admitting: Internal Medicine

## 2011-02-14 NOTE — Telephone Encounter (Signed)
Spoke Vickie advised we cannot authorize pt's to hold Coumadin in the [facility], must be MD who clears pt to come off Coumadin.  Called Reynaldo Minium, CMA at Dr Alona Bene office she asked Dr Maple Hudson if pt could hold Coumadin starting today for Arthrogram on 02/17/11.  Dr Maple Hudson gave verbal clearance for pt to hold Coumadin x 4 days for procedure on 02/17/11.   Called Chip Boer back at [facility] advised ok per Dr Jetty Duhamel to hold Coumadin x 4 days prior to procedure.  Made [facility] appt for pt to check INR on 02/17/11 at 2pm.  Chip Boer was going to call and inform pt of instructions and appointment.

## 2011-02-14 NOTE — Telephone Encounter (Signed)
Pt is followed for his coumadin at the [facility] and I will forward this msg to Tiffany. Pt will be having an MRI Arthrogram of his right wrist on Mon., 10/15. Chip Boer at [facility] says the pt needs to stop his coumadin today.

## 2011-02-17 ENCOUNTER — Ambulatory Visit
Admission: RE | Admit: 2011-02-17 | Discharge: 2011-02-17 | Disposition: A | Discharge status: Home / Self Care | Payer: Medicare Other | Source: Ambulatory Visit | Attending: Orthopaedic Surgery | Admitting: Orthopaedic Surgery

## 2011-02-17 ENCOUNTER — Ambulatory Visit (INDEPENDENT_AMBULATORY_CARE_PROVIDER_SITE_OTHER): Payer: Medicare Other | Admitting: *Deleted

## 2011-02-17 DIAGNOSIS — M25531 Pain in right wrist: Secondary | ICD-10-CM

## 2011-02-17 DIAGNOSIS — I2699 Other pulmonary embolism without acute cor pulmonale: Secondary | ICD-10-CM

## 2011-02-17 LAB — POCT INR: INR: 1.4

## 2011-02-17 MED ORDER — IOHEXOL 180 MG/ML  SOLN
3.0000 mL | Freq: Once | INTRAMUSCULAR | Status: AC | PRN
  Administered 2011-02-17: 3 mL via INTRA_ARTICULAR

## 2011-02-26 ENCOUNTER — Ambulatory Visit (INDEPENDENT_AMBULATORY_CARE_PROVIDER_SITE_OTHER): Payer: Medicare Other | Admitting: *Deleted

## 2011-02-26 ENCOUNTER — Encounter: Payer: Medicare Other | Admitting: *Deleted

## 2011-02-26 DIAGNOSIS — I2699 Other pulmonary embolism without acute cor pulmonale: Secondary | ICD-10-CM

## 2011-02-26 LAB — POCT INR: INR: 3.1

## 2011-03-24 ENCOUNTER — Other Ambulatory Visit (HOSPITAL_BASED_OUTPATIENT_CLINIC_OR_DEPARTMENT_OTHER): Payer: Medicare Other | Admitting: Lab

## 2011-03-24 ENCOUNTER — Telehealth: Payer: Self-pay | Admitting: Pharmacist

## 2011-03-24 ENCOUNTER — Other Ambulatory Visit: Payer: Self-pay | Admitting: Oncology

## 2011-03-24 DIAGNOSIS — C8587 Other specified types of non-Hodgkin lymphoma, spleen: Secondary | ICD-10-CM

## 2011-03-24 LAB — CBC WITH DIFFERENTIAL/PLATELET
BASO%: 1 % (ref 0.0–2.0)
Basophils Absolute: 0.1 10*3/uL (ref 0.0–0.1)
EOS%: 5.6 % (ref 0.0–7.0)
Eosinophils Absolute: 0.4 10*3/uL (ref 0.0–0.5)
HCT: 43 % (ref 38.4–49.9)
HGB: 14.5 g/dL (ref 13.0–17.1)
LYMPH%: 13.5 % — ABNORMAL LOW (ref 14.0–49.0)
MCH: 30.3 pg (ref 27.2–33.4)
MCHC: 33.7 g/dL (ref 32.0–36.0)
MCV: 89.8 fL (ref 79.3–98.0)
MONO#: 0.9 10*3/uL (ref 0.1–0.9)
MONO%: 14.4 % — ABNORMAL HIGH (ref 0.0–14.0)
NEUT#: 4.1 10*3/uL (ref 1.5–6.5)
NEUT%: 65.5 % (ref 39.0–75.0)
Platelets: 295 10*3/uL (ref 140–400)
RBC: 4.79 10*6/uL (ref 4.20–5.82)
RDW: 14.8 % — ABNORMAL HIGH (ref 11.0–14.6)
WBC: 6.2 10*3/uL (ref 4.0–10.3)
lymph#: 0.8 10*3/uL — ABNORMAL LOW (ref 0.9–3.3)
nRBC: 0 % (ref 0–0)

## 2011-03-24 LAB — COMPREHENSIVE METABOLIC PANEL
ALT: 18 U/L (ref 0–53)
AST: 15 U/L (ref 0–37)
Albumin: 4.1 g/dL (ref 3.5–5.2)
Alkaline Phosphatase: 57 U/L (ref 39–117)
BUN: 11 mg/dL (ref 6–23)
CO2: 29 mEq/L (ref 19–32)
Calcium: 9 mg/dL (ref 8.4–10.5)
Chloride: 105 mEq/L (ref 96–112)
Creatinine, Ser: 0.69 mg/dL (ref 0.50–1.35)
Glucose, Bld: 90 mg/dL (ref 70–99)
Potassium: 4.5 mEq/L (ref 3.5–5.3)
Sodium: 141 mEq/L (ref 135–145)
Total Bilirubin: 0.3 mg/dL (ref 0.3–1.2)
Total Protein: 6.1 g/dL (ref 6.0–8.3)

## 2011-03-24 LAB — LACTATE DEHYDROGENASE: LDH: 99 U/L (ref 94–250)

## 2011-03-24 LAB — URIC ACID: Uric Acid, Serum: 4.9 mg/dL (ref 4.0–7.8)

## 2011-03-24 NOTE — Telephone Encounter (Signed)
OK to stop Coumadin for 5 days for necessary surgery as requested.

## 2011-03-24 NOTE — Telephone Encounter (Signed)
Per Dr. Maple Hudson, okay to stop Coumadin 5 days prior to orthopedic surgery. Phone note faxed to [facility] with Dr. Mina Marble @ [phone]

## 2011-03-24 NOTE — Telephone Encounter (Signed)
Pt scheduled for R wrist arthroscopy on 11/26 with Dr. Mina Marble.  Needs clearance to hold Coumadin 5 days prior to procedure.   Will send to Dr. Maple Hudson for approval.    *Once approved, sent note to Nettie Elm at [phone] (fax)*

## 2011-03-25 ENCOUNTER — Ambulatory Visit (INDEPENDENT_AMBULATORY_CARE_PROVIDER_SITE_OTHER): Payer: Medicare Other | Admitting: *Deleted

## 2011-03-25 ENCOUNTER — Telehealth: Payer: Self-pay | Admitting: Internal Medicine

## 2011-03-25 DIAGNOSIS — I2699 Other pulmonary embolism without acute cor pulmonale: Secondary | ICD-10-CM

## 2011-03-25 LAB — COMPREHENSIVE METABOLIC PANEL
ALT: 18 U/L (ref 0–53)
AST: 15 U/L (ref 0–37)
Albumin: 4.1 g/dL (ref 3.5–5.2)
Alkaline Phosphatase: 57 U/L (ref 39–117)
BUN: 11 mg/dL (ref 6–23)
CO2: 29 mEq/L (ref 19–32)
Calcium: 9 mg/dL (ref 8.4–10.5)
Chloride: 105 mEq/L (ref 96–112)
Creatinine, Ser: 0.69 mg/dL (ref 0.50–1.35)
Glucose, Bld: 90 mg/dL (ref 70–99)
Potassium: 4.5 mEq/L (ref 3.5–5.3)
Sodium: 141 mEq/L (ref 135–145)
Total Bilirubin: 0.3 mg/dL (ref 0.3–1.2)
Total Protein: 6.1 g/dL (ref 6.0–8.3)

## 2011-03-25 LAB — LACTATE DEHYDROGENASE: LDH: 99 U/L (ref 94–250)

## 2011-03-25 LAB — BETA 2 MICROGLOBULIN, SERUM: Beta-2 Microglobulin: 1.42 mg/L (ref 1.01–1.73)

## 2011-03-25 LAB — URIC ACID: Uric Acid, Serum: 4.9 mg/dL (ref 4.0–7.8)

## 2011-03-25 LAB — POCT INR: INR: 2.5

## 2011-03-25 NOTE — Telephone Encounter (Signed)
I have refaxed note to # given to fax to. Lm advising if they did not receive it to call back

## 2011-04-10 ENCOUNTER — Ambulatory Visit (INDEPENDENT_AMBULATORY_CARE_PROVIDER_SITE_OTHER): Payer: Medicare Other

## 2011-04-10 DIAGNOSIS — Z23 Encounter for immunization: Secondary | ICD-10-CM

## 2011-04-11 ENCOUNTER — Ambulatory Visit (INDEPENDENT_AMBULATORY_CARE_PROVIDER_SITE_OTHER): Payer: Medicare Other | Admitting: *Deleted

## 2011-04-11 DIAGNOSIS — I2699 Other pulmonary embolism without acute cor pulmonale: Secondary | ICD-10-CM

## 2011-04-11 LAB — POCT INR: INR: 1.9

## 2011-04-12 ENCOUNTER — Telehealth: Payer: Self-pay | Admitting: Oncology

## 2011-04-12 NOTE — Telephone Encounter (Signed)
S/w the pt regarding his jan 2013 appts °

## 2011-04-16 ENCOUNTER — Telehealth: Payer: Self-pay | Admitting: *Deleted

## 2011-04-16 NOTE — Telephone Encounter (Signed)
left voice message to inform the patient of the new date and time 05-28-2011 at 11:00

## 2011-05-07 DIAGNOSIS — M1710 Unilateral primary osteoarthritis, unspecified knee: Secondary | ICD-10-CM | POA: Diagnosis not present

## 2011-05-09 ENCOUNTER — Ambulatory Visit (INDEPENDENT_AMBULATORY_CARE_PROVIDER_SITE_OTHER): Payer: Medicare Other | Admitting: *Deleted

## 2011-05-09 DIAGNOSIS — I2699 Other pulmonary embolism without acute cor pulmonale: Secondary | ICD-10-CM | POA: Diagnosis not present

## 2011-05-09 LAB — POCT INR: INR: 2.3

## 2011-05-14 DIAGNOSIS — M1710 Unilateral primary osteoarthritis, unspecified knee: Secondary | ICD-10-CM | POA: Diagnosis not present

## 2011-05-19 ENCOUNTER — Other Ambulatory Visit (HOSPITAL_BASED_OUTPATIENT_CLINIC_OR_DEPARTMENT_OTHER): Payer: Medicare Other | Admitting: Lab

## 2011-05-19 ENCOUNTER — Other Ambulatory Visit: Payer: Self-pay | Admitting: Oncology

## 2011-05-19 DIAGNOSIS — C8587 Other specified types of non-Hodgkin lymphoma, spleen: Secondary | ICD-10-CM

## 2011-05-19 LAB — CBC WITH DIFFERENTIAL/PLATELET
BASO%: 0.3 % (ref 0.0–2.0)
Basophils Absolute: 0 10*3/uL (ref 0.0–0.1)
EOS%: 6.4 % (ref 0.0–7.0)
Eosinophils Absolute: 0.4 10*3/uL (ref 0.0–0.5)
HCT: 42.2 % (ref 38.4–49.9)
HGB: 14.4 g/dL (ref 13.0–17.1)
LYMPH%: 15.1 % (ref 14.0–49.0)
MCH: 31.1 pg (ref 27.2–33.4)
MCHC: 34.1 g/dL (ref 32.0–36.0)
MCV: 91.3 fL (ref 79.3–98.0)
MONO#: 0.7 10*3/uL (ref 0.1–0.9)
MONO%: 11.5 % (ref 0.0–14.0)
NEUT#: 4.3 10*3/uL (ref 1.5–6.5)
NEUT%: 66.7 % (ref 39.0–75.0)
Platelets: 292 10*3/uL (ref 140–400)
RBC: 4.62 10*6/uL (ref 4.20–5.82)
RDW: 13.8 % (ref 11.0–14.6)
WBC: 6.5 10*3/uL (ref 4.0–10.3)
lymph#: 1 10*3/uL (ref 0.9–3.3)

## 2011-05-20 LAB — COMPREHENSIVE METABOLIC PANEL
ALT: 19 U/L (ref 0–53)
AST: 16 U/L (ref 0–37)
Albumin: 4.2 g/dL (ref 3.5–5.2)
Alkaline Phosphatase: 56 U/L (ref 39–117)
BUN: 16 mg/dL (ref 6–23)
CO2: 26 mEq/L (ref 19–32)
Calcium: 8.8 mg/dL (ref 8.4–10.5)
Chloride: 104 mEq/L (ref 96–112)
Creatinine, Ser: 0.75 mg/dL (ref 0.50–1.35)
Glucose, Bld: 91 mg/dL (ref 70–99)
Potassium: 4.2 mEq/L (ref 3.5–5.3)
Sodium: 140 mEq/L (ref 135–145)
Total Bilirubin: 0.4 mg/dL (ref 0.3–1.2)
Total Protein: 6.3 g/dL (ref 6.0–8.3)

## 2011-05-20 LAB — URIC ACID: Uric Acid, Serum: 5.4 mg/dL (ref 4.0–7.8)

## 2011-05-20 LAB — LACTATE DEHYDROGENASE: LDH: 114 U/L (ref 94–250)

## 2011-05-20 LAB — BETA 2 MICROGLOBULIN, SERUM: Beta-2 Microglobulin: 1.36 mg/L (ref 1.01–1.73)

## 2011-05-21 DIAGNOSIS — M1710 Unilateral primary osteoarthritis, unspecified knee: Secondary | ICD-10-CM | POA: Diagnosis not present

## 2011-05-27 ENCOUNTER — Ambulatory Visit (INDEPENDENT_AMBULATORY_CARE_PROVIDER_SITE_OTHER): Payer: Medicare Other | Admitting: General Surgery

## 2011-05-27 ENCOUNTER — Encounter (INDEPENDENT_AMBULATORY_CARE_PROVIDER_SITE_OTHER): Payer: Self-pay | Admitting: General Surgery

## 2011-05-27 VITALS — BP 118/76 | Ht 72.0 in | Wt 331.0 lb

## 2011-05-27 DIAGNOSIS — K432 Incisional hernia without obstruction or gangrene: Secondary | ICD-10-CM | POA: Diagnosis not present

## 2011-05-27 NOTE — Progress Notes (Signed)
HPI The patient comes in with a complaint of a possible midline hernia after his subcostal incision for a splenectomy to urinate half ago. The patient is minimally symptomatic and currently is not seeking to have surgery. He continues to be morbidly obese  PE On examination he's got about an 8 cm midline hernia at the medial aspect of his subcostal incision on the left side.  Studiy review Currently there are no studies to review.  Assessment Incisional ventral hernia the medial aspect of a previous subcostal incision.  Plan Because the patient has no symptoms at this time and there appears to be no entrapment of vital organs surgeries none urgent. I advised him that over time the hernia will likely get larger however to update the possibility of an enlarging I am going to order that he start wearing a binder especially when he is walking around to her normal activities I will recheck him in 6 months to see if there is been any progression and enlargement of the hernia.

## 2011-05-28 ENCOUNTER — Ambulatory Visit (HOSPITAL_BASED_OUTPATIENT_CLINIC_OR_DEPARTMENT_OTHER): Payer: Medicare Other | Admitting: Oncology

## 2011-05-28 ENCOUNTER — Other Ambulatory Visit: Payer: Self-pay | Admitting: Orthopaedic Surgery

## 2011-05-28 VITALS — BP 131/84 | HR 61 | Temp 97.7°F | Ht 72.0 in | Wt 332.7 lb

## 2011-05-28 DIAGNOSIS — Z7901 Long term (current) use of anticoagulants: Secondary | ICD-10-CM | POA: Diagnosis not present

## 2011-05-28 DIAGNOSIS — Z86718 Personal history of other venous thrombosis and embolism: Secondary | ICD-10-CM | POA: Diagnosis not present

## 2011-05-28 DIAGNOSIS — M25569 Pain in unspecified knee: Secondary | ICD-10-CM | POA: Diagnosis not present

## 2011-05-28 DIAGNOSIS — C8587 Other specified types of non-Hodgkin lymphoma, spleen: Secondary | ICD-10-CM | POA: Diagnosis not present

## 2011-05-28 DIAGNOSIS — M1710 Unilateral primary osteoarthritis, unspecified knee: Secondary | ICD-10-CM | POA: Diagnosis not present

## 2011-05-28 DIAGNOSIS — M25561 Pain in right knee: Secondary | ICD-10-CM

## 2011-05-28 DIAGNOSIS — Z87898 Personal history of other specified conditions: Secondary | ICD-10-CM
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
## 2011-05-29 ENCOUNTER — Ambulatory Visit: Payer: Medicare Other | Admitting: Oncology

## 2011-06-02 ENCOUNTER — Other Ambulatory Visit: Payer: Self-pay | Admitting: Oncology

## 2011-06-02 ENCOUNTER — Telehealth: Payer: Self-pay | Admitting: Oncology

## 2011-06-02 DIAGNOSIS — C8590 Non-Hodgkin lymphoma, unspecified, unspecified site: Secondary | ICD-10-CM

## 2011-06-02 NOTE — Telephone Encounter (Signed)
lmonvm advising the pt of his pet scan appt in aug

## 2011-06-03 ENCOUNTER — Ambulatory Visit
Admission: RE | Admit: 2011-06-03 | Discharge: 2011-06-03 | Disposition: A | Discharge status: Home / Self Care | Payer: Medicare Other | Source: Ambulatory Visit | Attending: Orthopaedic Surgery | Admitting: Orthopaedic Surgery

## 2011-06-03 DIAGNOSIS — M25561 Pain in right knee: Secondary | ICD-10-CM

## 2011-06-03 DIAGNOSIS — IMO0002 Reserved for concepts with insufficient information to code with codable children: Secondary | ICD-10-CM | POA: Diagnosis not present

## 2011-06-03 DIAGNOSIS — M2350 Chronic instability of knee, unspecified knee: Secondary | ICD-10-CM | POA: Diagnosis not present

## 2011-06-03 DIAGNOSIS — M25569 Pain in unspecified knee: Secondary | ICD-10-CM | POA: Diagnosis not present

## 2011-06-04 ENCOUNTER — Other Ambulatory Visit: Payer: Medicare Other

## 2011-06-06 ENCOUNTER — Ambulatory Visit (INDEPENDENT_AMBULATORY_CARE_PROVIDER_SITE_OTHER): Payer: Medicare Other | Admitting: *Deleted

## 2011-06-06 DIAGNOSIS — I2699 Other pulmonary embolism without acute cor pulmonale: Secondary | ICD-10-CM | POA: Diagnosis not present

## 2011-06-06 LAB — POCT INR: INR: 2.1

## 2011-06-13 ENCOUNTER — Other Ambulatory Visit: Payer: Self-pay | Admitting: Orthopaedic Surgery

## 2011-06-13 DIAGNOSIS — M25562 Pain in left knee: Secondary | ICD-10-CM

## 2011-06-16 ENCOUNTER — Ambulatory Visit
Admission: RE | Admit: 2011-06-16 | Discharge: 2011-06-16 | Disposition: A | Discharge status: Home / Self Care | Payer: Medicare Other | Source: Ambulatory Visit | Attending: Orthopaedic Surgery | Admitting: Orthopaedic Surgery

## 2011-06-16 DIAGNOSIS — IMO0002 Reserved for concepts with insufficient information to code with codable children: Secondary | ICD-10-CM | POA: Diagnosis not present

## 2011-06-16 DIAGNOSIS — M25569 Pain in unspecified knee: Secondary | ICD-10-CM | POA: Diagnosis not present

## 2011-06-16 DIAGNOSIS — M25562 Pain in left knee: Secondary | ICD-10-CM

## 2011-06-16 DIAGNOSIS — S83289A Other tear of lateral meniscus, current injury, unspecified knee, initial encounter: Secondary | ICD-10-CM | POA: Diagnosis not present

## 2011-06-26 ENCOUNTER — Telehealth: Payer: Self-pay | Admitting: Internal Medicine

## 2011-06-26 NOTE — Telephone Encounter (Signed)
Pt having arthroscopic surgery of the left knee on 07/03/11 and he will need to stop Coumadin. He would like recs on how this needs to be done. Pls advise.

## 2011-06-27 NOTE — Telephone Encounter (Signed)
Pt stated [facility] asked for the name of his orthopedic surgeon, Dr. Norlene Campbell.  Pt will also be available on his cell phone,  [phone], for the rest of the day.  Antionette Fairy

## 2011-06-30 NOTE — Telephone Encounter (Signed)
I called and spoke with Marc Mills 2/22 on his cell. He reports that Dr Hoy Register office is leaving anticoagulation decisions to [place]. I offered choice of stopping warfarin 3 days preop,  and using lovenox bridge till day before surgery, vs just stopping warfarin 3 days before. He was aware of Lovenox. He just stopped warfarin before wrist surgery a month or so ago, without problems. I pointed out that legs are riskier. Final choice was to stop warfarin 3 days before, w/o substitution.

## 2011-07-03 DIAGNOSIS — M23302 Other meniscus derangements, unspecified lateral meniscus, unspecified knee: Secondary | ICD-10-CM | POA: Diagnosis not present

## 2011-07-03 DIAGNOSIS — M9420 Chondromalacia, unspecified site: Secondary | ICD-10-CM | POA: Diagnosis not present

## 2011-07-03 DIAGNOSIS — M659 Synovitis and tenosynovitis, unspecified: Secondary | ICD-10-CM | POA: Diagnosis not present

## 2011-07-03 DIAGNOSIS — M23329 Other meniscus derangements, posterior horn of medial meniscus, unspecified knee: Secondary | ICD-10-CM | POA: Diagnosis not present

## 2011-07-03 DIAGNOSIS — IMO0002 Reserved for concepts with insufficient information to code with codable children: Secondary | ICD-10-CM | POA: Diagnosis not present

## 2011-07-07 DIAGNOSIS — IMO0002 Reserved for concepts with insufficient information to code with codable children: Secondary | ICD-10-CM | POA: Diagnosis not present

## 2011-07-07 DIAGNOSIS — M659 Synovitis and tenosynovitis, unspecified: Secondary | ICD-10-CM | POA: Diagnosis not present

## 2011-07-11 ENCOUNTER — Ambulatory Visit (INDEPENDENT_AMBULATORY_CARE_PROVIDER_SITE_OTHER): Payer: Medicare Other | Admitting: *Deleted

## 2011-07-11 DIAGNOSIS — I2699 Other pulmonary embolism without acute cor pulmonale: Secondary | ICD-10-CM | POA: Diagnosis not present

## 2011-07-11 LAB — POCT INR: INR: 2.2

## 2011-07-14 ENCOUNTER — Other Ambulatory Visit: Payer: Medicare Other | Admitting: Lab

## 2011-07-21 DIAGNOSIS — M659 Synovitis and tenosynovitis, unspecified: Secondary | ICD-10-CM | POA: Diagnosis not present

## 2011-07-21 DIAGNOSIS — IMO0002 Reserved for concepts with insufficient information to code with codable children: Secondary | ICD-10-CM | POA: Diagnosis not present

## 2011-07-26 ENCOUNTER — Other Ambulatory Visit: Payer: Self-pay | Admitting: *Deleted

## 2011-07-26 MED ORDER — CITALOPRAM HYDROBROMIDE 40 MG PO TABS: 40.0000 mg | ORAL_TABLET | Freq: Every day | ORAL | Status: DC

## 2011-07-30 ENCOUNTER — Ambulatory Visit: Payer: Medicare Other

## 2011-07-30 ENCOUNTER — Ambulatory Visit (INDEPENDENT_AMBULATORY_CARE_PROVIDER_SITE_OTHER): Payer: Medicare Other | Admitting: Emergency Medicine

## 2011-07-30 VITALS — BP 125/80 | HR 73 | Temp 97.7°F | Resp 18 | Ht 70.0 in | Wt 331.0 lb

## 2011-07-30 DIAGNOSIS — R42 Dizziness and giddiness: Secondary | ICD-10-CM

## 2011-07-30 DIAGNOSIS — E669 Obesity, unspecified: Secondary | ICD-10-CM

## 2011-07-30 DIAGNOSIS — M542 Cervicalgia: Secondary | ICD-10-CM

## 2011-07-30 LAB — GLUCOSE, POCT (MANUAL RESULT ENTRY): POC Glucose: 93

## 2011-07-30 MED ORDER — PREDNISONE 20 MG PO TABS: ORAL_TABLET | ORAL | Status: DC

## 2011-07-30 NOTE — Patient Instructions (Signed)
Cervical Radiculopathy Cervical radiculopathy happens when a nerve in the neck is pinched or bruised by a slipped (herniated) disk or by arthritic changes in the bones of the cervical spine. This can occur due to an injury or as part of the normal aging process. Pressure on the cervical nerves can cause pain or numbness that runs from your neck all the way down into your arm and fingers. CAUSES  There are many possible causes, including:  Injury.   Muscle tightness in the neck from overuse.   Swollen, painful joints (arthritis).   Breakdown or degeneration in the bones and joints of the spine (spondylosis) due to aging.   Bone spurs that may develop near the cervical nerves.  SYMPTOMS  Symptoms include pain, weakness, or numbness in the affected arm and hand. Pain can be severe or irritating. Symptoms may be worse when extending or turning the neck. DIAGNOSIS  Your caregiver will ask about your symptoms and do a physical exam. He or she may test your strength and reflexes. X-rays, CT scans, and MRI scans may be needed in cases of injury or if the symptoms do not go away after a period of time. Electromyography (EMG) or nerve conduction testing may be done to study how your nerves and muscles are working. TREATMENT  Your caregiver may recommend certain exercises to help relieve your symptoms. Cervical radiculopathy can, and often does, get better with time and treatment. If your problems continue, treatment options may include:  Wearing a soft collar for short periods of time.   Physical therapy to strengthen the neck muscles.   Medicines, such as nonsteroidal anti-inflammatory drugs (NSAIDs), oral corticosteroids, or spinal injections.   Surgery. Different types of surgery may be done depending on the cause of your problems.  HOME CARE INSTRUCTIONS   Put ice on the affected area.   Put ice in a plastic bag.   Place a towel between your skin and the bag.   Leave the ice on for 15  to 20 minutes, 3 to 4 times a day or as directed by your caregiver.   Use a flat pillow when you sleep.   Only take over-the-counter or prescription medicines for pain, discomfort, or fever as directed by your caregiver.   If physical therapy was prescribed, follow your caregiver's directions.   If a soft collar was prescribed, use it as directed.  SEEK IMMEDIATE MEDICAL CARE IF:   Your pain gets much worse and cannot be controlled with medicines.   You have weakness or numbness in your hand, arm, face, or leg.   You have a high fever or a stiff, rigid neck.   You lose bowel or bladder control (incontinence).   You have trouble with walking, balance, or speaking.  MAKE SURE YOU:   Understand these instructions.   Will watch your condition.   Will get help right away if you are not doing well or get worse.  Document Released: 01/14/2001 Document Revised: 04/10/2011 Document Reviewed: 12/03/2010 Exit[employer] Patient Information 2012 [employer]. 

## 2011-07-30 NOTE — Progress Notes (Signed)
  Subjective:    Patient ID: Marc Mills, male    DOB: 1946/04/14, 66 y.o.   MRN: [id number]  HPI patient is a 4 to five-day history of severe pain in the left side of his neck with radicular symptoms which extend down the left arm too The left elbow. In his neck he feels exquisite pain. He does swim on a regular basis. He denies any trauma to his back he.    Review of Systems patient history DVTs and is on chronic Coumadin therapy for this. He has a history of lymphoma and is currently in remission from this problem.     Objective:   Physical Exam  Neurological:       Examination of the neck reveals tenderness in the left paracervical area. There is pain with extension of the neck with twisting. Deep tendon reflexes are 2+ and symmetrical in the arms except for the left biceps is only trace positive. He is symmetrical however there is some mild weakness of extension of the left wrist     [facility] reading (PRIMARY) by  Dr.Chaniya Genter x-ray shows severe degenerative disc disease C5-6 and C6-7       Assessment & Plan:  Symptoms are consistent with a left cervical radiculopathy.

## 2011-08-11 DIAGNOSIS — M1710 Unilateral primary osteoarthritis, unspecified knee: Secondary | ICD-10-CM | POA: Diagnosis not present

## 2011-08-22 ENCOUNTER — Ambulatory Visit (INDEPENDENT_AMBULATORY_CARE_PROVIDER_SITE_OTHER): Payer: Medicare Other | Admitting: *Deleted

## 2011-08-22 DIAGNOSIS — I2699 Other pulmonary embolism without acute cor pulmonale: Secondary | ICD-10-CM

## 2011-08-22 LAB — POCT INR: INR: 2.8

## 2011-09-04 ENCOUNTER — Other Ambulatory Visit (HOSPITAL_BASED_OUTPATIENT_CLINIC_OR_DEPARTMENT_OTHER): Payer: Medicare Other | Admitting: Lab

## 2011-09-04 DIAGNOSIS — Z87898 Personal history of other specified conditions: Secondary | ICD-10-CM

## 2011-09-04 LAB — CBC WITH DIFFERENTIAL/PLATELET
BASO%: 1 % (ref 0.0–2.0)
Basophils Absolute: 0.1 10*3/uL (ref 0.0–0.1)
EOS%: 7.2 % — ABNORMAL HIGH (ref 0.0–7.0)
Eosinophils Absolute: 0.4 10*3/uL (ref 0.0–0.5)
HCT: 42.8 % (ref 38.4–49.9)
HGB: 14.3 g/dL (ref 13.0–17.1)
LYMPH%: 15.5 % (ref 14.0–49.0)
MCH: 30.4 pg (ref 27.2–33.4)
MCHC: 33.5 g/dL (ref 32.0–36.0)
MCV: 90.9 fL (ref 79.3–98.0)
MONO#: 0.9 10*3/uL (ref 0.1–0.9)
MONO%: 15.1 % — ABNORMAL HIGH (ref 0.0–14.0)
NEUT#: 3.8 10*3/uL (ref 1.5–6.5)
NEUT%: 61.2 % (ref 39.0–75.0)
Platelets: 317 10*3/uL (ref 140–400)
RBC: 4.71 10*6/uL (ref 4.20–5.82)
RDW: 14.1 % (ref 11.0–14.6)
WBC: 6.2 10*3/uL (ref 4.0–10.3)
lymph#: 1 10*3/uL (ref 0.9–3.3)
nRBC: 0 % (ref 0–0)

## 2011-09-05 DIAGNOSIS — M1710 Unilateral primary osteoarthritis, unspecified knee: Secondary | ICD-10-CM | POA: Diagnosis not present

## 2011-09-08 LAB — COMPREHENSIVE METABOLIC PANEL
ALT: 21 U/L (ref 0–53)
AST: 17 U/L (ref 0–37)
Albumin: 4.1 g/dL (ref 3.5–5.2)
Alkaline Phosphatase: 64 U/L (ref 39–117)
BUN: 14 mg/dL (ref 6–23)
CO2: 28 mEq/L (ref 19–32)
Calcium: 9.3 mg/dL (ref 8.4–10.5)
Chloride: 104 mEq/L (ref 96–112)
Creatinine, Ser: 0.72 mg/dL (ref 0.50–1.35)
Glucose, Bld: 132 mg/dL — ABNORMAL HIGH (ref 70–99)
Potassium: 4.6 mEq/L (ref 3.5–5.3)
Sodium: 141 mEq/L (ref 135–145)
Total Bilirubin: 0.3 mg/dL (ref 0.3–1.2)
Total Protein: 6.4 g/dL (ref 6.0–8.3)

## 2011-09-08 LAB — URIC ACID: Uric Acid, Serum: 4.4 mg/dL (ref 4.0–7.8)

## 2011-09-08 LAB — BETA 2 MICROGLOBULIN, SERUM: Beta-2 Microglobulin: 1.98 mg/L — ABNORMAL HIGH (ref 1.01–1.73)

## 2011-09-08 LAB — LACTATE DEHYDROGENASE: LDH: 113 U/L (ref 94–250)

## 2011-09-15 ENCOUNTER — Other Ambulatory Visit: Payer: Self-pay | Admitting: Oncology

## 2011-09-15 ENCOUNTER — Telehealth: Payer: Self-pay | Admitting: *Deleted

## 2011-09-15 DIAGNOSIS — C8590 Non-Hodgkin lymphoma, unspecified, unspecified site: Secondary | ICD-10-CM

## 2011-09-15 NOTE — Telephone Encounter (Signed)
scheduled patient to get lab only appointment for 09-30-2011 at 9:00am left voice message to inform the patient of the new date and time of the lab only appointment

## 2011-09-17 ENCOUNTER — Telehealth: Payer: Self-pay | Admitting: Internal Medicine

## 2011-09-17 DIAGNOSIS — G4733 Obstructive sleep apnea (adult) (pediatric): Secondary | ICD-10-CM

## 2011-09-17 NOTE — Telephone Encounter (Signed)
lmomtcb x1 for pt 

## 2011-09-18 ENCOUNTER — Encounter: Payer: Self-pay | Admitting: Internal Medicine

## 2011-09-18 NOTE — Telephone Encounter (Signed)
Faxed order to [facility] to check on getting pt a new cpap mask. Christoper Allegra will have to check insurance coverage and if they will cover cpap supplies. [facility] can not check on insurance coverage for cpap equipment and supplies b/c we will need the cpt codes in order to check this. DME company will do this, but I doubt if medicare will approve purchasing mask b/c patient hasn't had an ov since 2011. Patient may be able to just purchase mask if Christoper Allegra is unable to bill for mask until pt has an office visit. I have requested that [facility] check on insurance for patient.

## 2011-09-18 NOTE — Telephone Encounter (Signed)
LMTCB x2  

## 2011-09-18 NOTE — Telephone Encounter (Signed)
Spoke with pt. He states that his CPAP mask is starting to tear and he needs replacement mask asap. He states that medicare requires not only an order, but documentation of a recent ov and his last ov was in 2011. He is asking to be seen sooner than his planned appt in June. Please advise thanks!

## 2011-09-18 NOTE — Telephone Encounter (Signed)
Spoke with pt. He states uses [facility] for his DME and that they will not cover this without ov. I will go ahead and send order to [facility] for request for mask and see what ins will cover. Will forward to [facility] to check on ins coverage, thanks

## 2011-09-18 NOTE — Telephone Encounter (Signed)
Per CY-okay to give order to patients DME to have new mask prior to OV in June 2013 with CY; please ask patient what DME she uses and lets have [facility] find out if insurance will cover.

## 2011-09-18 NOTE — Telephone Encounter (Signed)
Will sign off of this message---pt has appt with CY in June and [facility] is checking on the status of a new mask for the pt.

## 2011-09-25 ENCOUNTER — Other Ambulatory Visit: Payer: Self-pay | Admitting: Family Medicine

## 2011-09-25 MED ORDER — SILDENAFIL CITRATE 100 MG PO TABS: ORAL_TABLET | ORAL | Status: DC

## 2011-09-26 DIAGNOSIS — M25819 Other specified joint disorders, unspecified shoulder: Secondary | ICD-10-CM | POA: Diagnosis not present

## 2011-09-30 ENCOUNTER — Other Ambulatory Visit (HOSPITAL_BASED_OUTPATIENT_CLINIC_OR_DEPARTMENT_OTHER): Payer: Medicare Other | Admitting: Lab

## 2011-09-30 ENCOUNTER — Ambulatory Visit (INDEPENDENT_AMBULATORY_CARE_PROVIDER_SITE_OTHER): Payer: Medicare Other | Admitting: Pharmacist

## 2011-09-30 DIAGNOSIS — R161 Splenomegaly, not elsewhere classified: Secondary | ICD-10-CM | POA: Diagnosis not present

## 2011-09-30 DIAGNOSIS — I2699 Other pulmonary embolism without acute cor pulmonale: Secondary | ICD-10-CM | POA: Diagnosis not present

## 2011-09-30 DIAGNOSIS — C8589 Other specified types of non-Hodgkin lymphoma, extranodal and solid organ sites: Secondary | ICD-10-CM | POA: Diagnosis not present

## 2011-09-30 DIAGNOSIS — C8590 Non-Hodgkin lymphoma, unspecified, unspecified site: Secondary | ICD-10-CM

## 2011-09-30 LAB — POCT INR: INR: 2.3

## 2011-10-01 LAB — BETA 2 MICROGLOBULIN, SERUM: Beta-2 Microglobulin: 1.35 mg/L (ref 1.01–1.73)

## 2011-10-02 ENCOUNTER — Telehealth: Payer: Self-pay | Admitting: Oncology

## 2011-10-02 NOTE — Telephone Encounter (Signed)
S/w the pt and he is aware of his r/s 12/18/2011 appt to 12/23/2011 due to the md's schedule

## 2011-10-06 ENCOUNTER — Encounter (INDEPENDENT_AMBULATORY_CARE_PROVIDER_SITE_OTHER): Payer: Self-pay | Admitting: General Surgery

## 2011-10-06 ENCOUNTER — Other Ambulatory Visit: Payer: Self-pay | Admitting: Oncology

## 2011-10-10 ENCOUNTER — Other Ambulatory Visit: Payer: Self-pay | Admitting: Physician Assistant

## 2011-10-15 ENCOUNTER — Ambulatory Visit (INDEPENDENT_AMBULATORY_CARE_PROVIDER_SITE_OTHER): Payer: Medicare Other | Admitting: Emergency Medicine

## 2011-10-15 VITALS — BP 123/80 | HR 60 | Temp 97.7°F | Resp 20 | Ht 70.25 in | Wt 332.8 lb

## 2011-10-15 DIAGNOSIS — Z5181 Encounter for therapeutic drug level monitoring: Secondary | ICD-10-CM | POA: Diagnosis not present

## 2011-10-15 DIAGNOSIS — Z961 Presence of intraocular lens: Secondary | ICD-10-CM | POA: Diagnosis not present

## 2011-10-15 DIAGNOSIS — I890 Lymphedema, not elsewhere classified: Secondary | ICD-10-CM | POA: Diagnosis not present

## 2011-10-15 DIAGNOSIS — Z0000 Encounter for general adult medical examination without abnormal findings: Secondary | ICD-10-CM

## 2011-10-15 DIAGNOSIS — N529 Male erectile dysfunction, unspecified: Secondary | ICD-10-CM

## 2011-10-15 DIAGNOSIS — R635 Abnormal weight gain: Secondary | ICD-10-CM | POA: Diagnosis not present

## 2011-10-15 DIAGNOSIS — Z125 Encounter for screening for malignant neoplasm of prostate: Secondary | ICD-10-CM | POA: Diagnosis not present

## 2011-10-15 LAB — POCT CBC
Granulocyte percent: 70.8 %G (ref 37–80)
HCT, POC: 43 % — AB (ref 43.5–53.7)
Hemoglobin: 13.8 g/dL — AB (ref 14.1–18.1)
Lymph, poc: 1.7 (ref 0.6–3.4)
MCH, POC: 29.4 pg (ref 27–31.2)
MCHC: 32.1 g/dL (ref 31.8–35.4)
MCV: 91.7 fL (ref 80–97)
MID (cbc): 1.1 — AB (ref 0–0.9)
MPV: 9.4 fL (ref 0–99.8)
POC Granulocyte: 6.7 (ref 2–6.9)
POC LYMPH PERCENT: 17.8 %L (ref 10–50)
POC MID %: 11.4 %M (ref 0–12)
Platelet Count, POC: 388 10*3/uL (ref 142–424)
RBC: 4.69 M/uL (ref 4.69–6.13)
RDW, POC: 15.9 %
WBC: 9.5 10*3/uL (ref 4.6–10.2)

## 2011-10-15 LAB — POCT UA - MICROSCOPIC ONLY
Bacteria, U Microscopic: NEGATIVE
Casts, Ur, LPF, POC: NEGATIVE
Crystals, Ur, HPF, POC: NEGATIVE
Epithelial cells, urine per micros: NEGATIVE
Mucus, UA: NEGATIVE
RBC, urine, microscopic: NEGATIVE
WBC, Ur, HPF, POC: NEGATIVE
Yeast, UA: NEGATIVE

## 2011-10-15 LAB — POCT URINALYSIS DIPSTICK
Bilirubin, UA: NEGATIVE
Blood, UA: NEGATIVE
Glucose, UA: NEGATIVE
Ketones, UA: NEGATIVE
Leukocytes, UA: NEGATIVE
Nitrite, UA: NEGATIVE
Protein, UA: NEGATIVE
Spec Grav, UA: 1.015
Urobilinogen, UA: 0.2
pH, UA: 7

## 2011-10-15 MED ORDER — WARFARIN SODIUM 5 MG PO TABS: 5.0000 mg | ORAL_TABLET | ORAL | Status: DC

## 2011-10-15 MED ORDER — CITALOPRAM HYDROBROMIDE 20 MG PO TABS: 20.0000 mg | ORAL_TABLET | Freq: Every day | ORAL | Status: DC

## 2011-10-15 MED ORDER — SILDENAFIL CITRATE 100 MG PO TABS: ORAL_TABLET | ORAL | Status: DC

## 2011-10-15 MED ORDER — HEPATITIS A VACCINE 1440 EL U/ML IM SUSP: 1.0000 mL | Freq: Once | INTRAMUSCULAR | Status: DC

## 2011-10-15 NOTE — Progress Notes (Signed)
@[email or web address] @  Patient ID: Marc Mills MRN: [id number], DOB: [birth year] 66 y.o. Date of Encounter: 10/15/2011, 3:22 PM  Primary Physician: Lucilla Edin, MD  Chief Complaint: Physical (CPE)  HPI: 66 y.o. y/o male with history noted below here for CPE.  Doing well. No issues/complaints.  Review of Systems Patient has a history of lymphoma and sees Dr. Darnelle Catalan every 2 months for blood work and check ups every 6 months Consitutional: No fever, chills, fatigue, night sweats, lymphadenopathy, or weight changes. Eyes: No visual changes, eye redness, or discharge. ENT/Mouth: Ears: No otalgia, tinnitus, hearing loss, discharge. Nose: No congestion, rhinorrhea, sinus pain, or epistaxis. Throat: No sore throat, post nasal drip, or teeth pain. Cardiovascular: No CP, palpitations, diaphoresis, DOE, edema, orthopnea, PND. Respiratory: No cough, hemoptysis, SOB, or wheezing. Gastrointestinal: No anorexia, dysphagia, reflux, pain, nausea, vomiting, hematemesis, diarrhea, constipation, BRBPR, or melena. Genitourinary: No dysuria, frequency, urgency, hematuria, incontinence, nocturia, decreased urinary stream, discharge, impotence, or testicular pain/masses. Musculoskeletal: No decreased ROM, myalgias, stiffness, joint swelling, or weakness. Skin: No rash, erythema, lesion changes, pain, warmth, jaundice, or pruritis. Neurological: No headache, dizziness, syncope, seizures, tremors, memory loss, coordination problems, or paresthesias. Psychological: No anxiety, depression, hallucinations, SI/HI. Endocrine: No fatigue, polydipsia, polyphagia, polyuria, or known diabetes. All other systems were reviewed and are otherwise negative.  Past Medical History  Diagnosis Date  . Cancer      Past Surgical History  Procedure Date  . Splenectomy   . Hernia repair     Home Meds:  Prior to Admission medications   Medication Sig Start Date End Date Taking? Authorizing Provider  acetaminophen (TYLENOL)  500 MG tablet Take 1,000 mg by mouth as needed.   Yes Historical Provider, MD  citalopram (CELEXA) 40 MG tablet Take 1 tablet (40 mg total) by mouth daily. 07/26/11  Yes Sarah Harvie Bridge, PA-C  sildenafil (VIAGRA) 100 MG tablet TAKE 1/2-1 TABLET 1 HOUR PRIOR TO INTERCOURSE, NEEDS OFFICE VISIT FOR MORE 09/25/11  Yes Ryan M Dunn, PA-C  traMADol (ULTRAM) 50 MG tablet Take 50 mg by mouth 2 (two) times daily.   Yes Historical Provider, MD  warfarin (COUMADIN) 5 MG tablet Take by mouth as directed.     Yes Historical Provider, MD  oxyCODONE-acetaminophen (PERCOCET) 5-325 MG per tablet  03/31/11   Historical Provider, MD  predniSONE (DELTASONE) 20 MG tablet Take 3 tablets a day for 3 days 2 tablets a day for 3 days one tablet a day for 3 days 07/30/11   Collene Gobble, MD    Allergies:  Allergies  Allergen Reactions  . Hydrocodone Itching and Other (See Comments)    "spaced out"  . Oxycodone Itching and Other (See Comments)    "spaced out"    History   Social History  . Marital Status: Married    Spouse Name: [name]    Number of Children: [name]  . Years of Education: [name]   Occupational History  . Not on file.   Social History Main Topics  . Smoking status: Former Games developer  . Smokeless tobacco: Not on file  . Alcohol Use: Not on file  . Drug Use: Not on file  . Sexually Active: Not on file   Other Topics Concern  . Not on file   Social History Narrative  . No narrative on file    Family History  Problem Relation Age of Onset  . Breast cancer      Physical Exam:  Blood pressure 123/80, pulse 60, temperature 97.7 F (  36.5 C), temperature source Oral, resp. rate 20, height 5' 10.25" (1.784 m), weight 332 lb 12.8 oz (150.957 kg), SpO2 95.00%.  General: Well developed, well nourished, in no acute distress. HEENT: Normocephalic, atraumatic. Conjunctiva pink, sclera non-icteric. Pupils 2 mm constricting to 1 mm, round, regular, and equally reactive to light and accomodation. EOMI. Internal  auditory canal clear. TMs with good cone of light and without pathology. Nasal mucosa pink. Nares are without discharge. No sinus tenderness. Oral mucosa pink. Dentition nl. Pharynx without exudate.   Neck: Supple. Trachea midline. No thyromegaly. Full ROM. No lymphadenopathy. Lungs: Clear to auscultation bilaterally without wheezes, rales, or rhonchi. Breathing is of normal effort and unlabored. Cardiovascular: RRR with S1 S2. No murmurs, rubs, or gallops appreciated. Distal pulses 2+ symmetrically. No carotid or abdominal bruits.  Abdomen: Soft, non-tender, non-distended with normoactive bowel sounds. No hepatosplenomegaly or masses. No rebound/guarding. No CVA tenderness. Without hernias there is a large scar in the left upper abdomen with a ventral hernia present medially.     Genitourinary  circumcised male. No penile lesions. Testes descended bilaterally, and smooth without tenderness or masses.  Musculoskeletal: Full range of motion and 5/5 strength throughout. Without swelling, atrophy, tenderness, crepitus, or warmth. Extremities without clubbing, cyanosis, or edema. Calves supple. Skin: Warm and moist without erythema, ecchymosis, wounds, or rash. Neuro: A+Ox3. CN II-XII grossly intact. Moves all extremities spontaneously. Full sensation throughout. Normal gait. DTR 2+ throughout upper and lower extremities. Finger to nose intact. Psych:  Responds to questions appropriately with a normal affect.    Studies: CBC, CMET, Lipid, PSA, TSH,  all pending     Assessment/Plan:  66 y.o. y/o   male here for CPE  -  Signed, Earl Lites, MD 10/15/2011 3:22 PM

## 2011-10-16 ENCOUNTER — Encounter: Payer: Self-pay | Admitting: Internal Medicine

## 2011-10-16 ENCOUNTER — Ambulatory Visit (INDEPENDENT_AMBULATORY_CARE_PROVIDER_SITE_OTHER): Payer: Medicare Other | Admitting: Internal Medicine

## 2011-10-16 VITALS — BP 116/80 | HR 61 | Ht 72.0 in | Wt 335.8 lb

## 2011-10-16 DIAGNOSIS — G4733 Obstructive sleep apnea (adult) (pediatric): Secondary | ICD-10-CM | POA: Diagnosis not present

## 2011-10-16 LAB — COMPREHENSIVE METABOLIC PANEL
ALT: 18 U/L (ref 0–53)
AST: 17 U/L (ref 0–37)
Albumin: 4.1 g/dL (ref 3.5–5.2)
Alkaline Phosphatase: 57 U/L (ref 39–117)
BUN: 17 mg/dL (ref 6–23)
CO2: 27 mEq/L (ref 19–32)
Calcium: 9.2 mg/dL (ref 8.4–10.5)
Chloride: 104 mEq/L (ref 96–112)
Creat: 0.8 mg/dL (ref 0.50–1.35)
Glucose, Bld: 82 mg/dL (ref 70–99)
Potassium: 4.5 mEq/L (ref 3.5–5.3)
Sodium: 139 mEq/L (ref 135–145)
Total Bilirubin: 0.4 mg/dL (ref 0.3–1.2)
Total Protein: 6.4 g/dL (ref 6.0–8.3)

## 2011-10-16 LAB — LIPID PANEL
Cholesterol: 160 mg/dL (ref 0–200)
HDL: 40 mg/dL (ref >39)
LDL Cholesterol: 90 mg/dL (ref 0–99)
Total CHOL/HDL Ratio: 4 Ratio
Triglycerides: 149 mg/dL (ref <150)
VLDL: 30 mg/dL (ref 0–40)

## 2011-10-16 LAB — TSH: TSH: 1.591 u[IU]/mL (ref 0.350–4.500)

## 2011-10-16 LAB — PSA, MEDICARE: PSA: 0.32 ng/mL (ref <=4.00)

## 2011-10-16 NOTE — Patient Instructions (Addendum)
Order- DME Christoper Allegra- continue CPAP 7 cwp, humidifier                Dx OSA                                 Replacement CPAP mask of choice and supplies
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
## 2011-10-24 NOTE — Assessment & Plan Note (Signed)
Very good compliance and control at 7 CWP. Plan-replacement CPAP mask and supplies. Continue current pressure. We will seek old sleep study in paper record.

## 2011-11-11 ENCOUNTER — Ambulatory Visit (INDEPENDENT_AMBULATORY_CARE_PROVIDER_SITE_OTHER): Payer: Medicare Other | Admitting: *Deleted

## 2011-11-11 DIAGNOSIS — I2699 Other pulmonary embolism without acute cor pulmonale: Secondary | ICD-10-CM

## 2011-11-11 LAB — POCT INR: INR: 3

## 2011-11-27 DIAGNOSIS — M79609 Pain in unspecified limb: Secondary | ICD-10-CM | POA: Diagnosis not present

## 2011-11-27 DIAGNOSIS — B351 Tinea unguium: Secondary | ICD-10-CM | POA: Diagnosis not present

## 2011-11-27 DIAGNOSIS — S92919A Unspecified fracture of unspecified toe(s), initial encounter for closed fracture: Secondary | ICD-10-CM | POA: Diagnosis not present

## 2011-12-12 ENCOUNTER — Other Ambulatory Visit (HOSPITAL_BASED_OUTPATIENT_CLINIC_OR_DEPARTMENT_OTHER): Payer: Medicare Other | Admitting: Lab

## 2011-12-12 ENCOUNTER — Encounter (HOSPITAL_COMMUNITY): Payer: Self-pay

## 2011-12-12 ENCOUNTER — Encounter (HOSPITAL_COMMUNITY)
Admission: RE | Admit: 2011-12-12 | Discharge: 2011-12-12 | Disposition: A | Discharge status: Home / Self Care | Payer: Medicare Other | Source: Ambulatory Visit | Attending: Oncology | Admitting: Oncology

## 2011-12-12 DIAGNOSIS — K5730 Diverticulosis of large intestine without perforation or abscess without bleeding: Secondary | ICD-10-CM | POA: Diagnosis not present

## 2011-12-12 DIAGNOSIS — K439 Ventral hernia without obstruction or gangrene: Secondary | ICD-10-CM | POA: Diagnosis not present

## 2011-12-12 DIAGNOSIS — Z87898 Personal history of other specified conditions: Secondary | ICD-10-CM

## 2011-12-12 DIAGNOSIS — C8587 Other specified types of non-Hodgkin lymphoma, spleen: Secondary | ICD-10-CM

## 2011-12-12 DIAGNOSIS — K4090 Unilateral inguinal hernia, without obstruction or gangrene, not specified as recurrent: Secondary | ICD-10-CM | POA: Diagnosis not present

## 2011-12-12 DIAGNOSIS — D739 Disease of spleen, unspecified: Secondary | ICD-10-CM | POA: Insufficient documentation

## 2011-12-12 DIAGNOSIS — N281 Cyst of kidney, acquired: Secondary | ICD-10-CM | POA: Diagnosis not present

## 2011-12-12 DIAGNOSIS — K7689 Other specified diseases of liver: Secondary | ICD-10-CM | POA: Diagnosis not present

## 2011-12-12 DIAGNOSIS — Q762 Congenital spondylolisthesis: Secondary | ICD-10-CM | POA: Diagnosis not present

## 2011-12-12 DIAGNOSIS — C8589 Other specified types of non-Hodgkin lymphoma, extranodal and solid organ sites: Secondary | ICD-10-CM | POA: Diagnosis not present

## 2011-12-12 DIAGNOSIS — J320 Chronic maxillary sinusitis: Secondary | ICD-10-CM | POA: Diagnosis not present

## 2011-12-12 DIAGNOSIS — C8590 Non-Hodgkin lymphoma, unspecified, unspecified site: Secondary | ICD-10-CM

## 2011-12-12 LAB — CBC WITH DIFFERENTIAL/PLATELET
BASO%: 0.8 % (ref 0.0–2.0)
Basophils Absolute: 0.1 10*3/uL (ref 0.0–0.1)
EOS%: 6.6 % (ref 0.0–7.0)
Eosinophils Absolute: 0.4 10*3/uL (ref 0.0–0.5)
HCT: 41.7 % (ref 38.4–49.9)
HGB: 14 g/dL (ref 13.0–17.1)
LYMPH%: 15.8 % (ref 14.0–49.0)
MCH: 29.9 pg (ref 27.2–33.4)
MCHC: 33.6 g/dL (ref 32.0–36.0)
MCV: 88.9 fL (ref 79.3–98.0)
MONO#: 1 10*3/uL — ABNORMAL HIGH (ref 0.1–0.9)
MONO%: 14.9 % — ABNORMAL HIGH (ref 0.0–14.0)
NEUT#: 4.1 10*3/uL (ref 1.5–6.5)
NEUT%: 61.9 % (ref 39.0–75.0)
Platelets: 358 10*3/uL (ref 140–400)
RBC: 4.69 10*6/uL (ref 4.20–5.82)
RDW: 14.5 % (ref 11.0–14.6)
WBC: 6.7 10*3/uL (ref 4.0–10.3)
lymph#: 1.1 10*3/uL (ref 0.9–3.3)
nRBC: 0 % (ref 0–0)

## 2011-12-12 LAB — GLUCOSE, CAPILLARY: Glucose-Capillary: 89 mg/dL (ref 70–99)

## 2011-12-12 MED ORDER — FLUDEOXYGLUCOSE F - 18 (FDG) INJECTION
17.6000 | Freq: Once | INTRAVENOUS | Status: AC | PRN
  Administered 2011-12-12: 17.6 via INTRAVENOUS

## 2011-12-15 ENCOUNTER — Other Ambulatory Visit: Payer: Self-pay | Admitting: Oncology

## 2011-12-15 LAB — LACTATE DEHYDROGENASE: LDH: 100 U/L (ref 94–250)

## 2011-12-15 LAB — COMPREHENSIVE METABOLIC PANEL
ALT: 18 U/L (ref 0–53)
AST: 16 U/L (ref 0–37)
Albumin: 3.9 g/dL (ref 3.5–5.2)
Alkaline Phosphatase: 57 U/L (ref 39–117)
BUN: 14 mg/dL (ref 6–23)
CO2: 27 mEq/L (ref 19–32)
Calcium: 9.2 mg/dL (ref 8.4–10.5)
Chloride: 106 mEq/L (ref 96–112)
Creatinine, Ser: 0.69 mg/dL (ref 0.50–1.35)
Glucose, Bld: 84 mg/dL (ref 70–99)
Potassium: 4.8 mEq/L (ref 3.5–5.3)
Sodium: 141 mEq/L (ref 135–145)
Total Bilirubin: 0.4 mg/dL (ref 0.3–1.2)
Total Protein: 6.4 g/dL (ref 6.0–8.3)

## 2011-12-15 LAB — URIC ACID: Uric Acid, Serum: 5.3 mg/dL (ref 4.0–7.8)

## 2011-12-15 LAB — BETA 2 MICROGLOBULIN, SERUM: Beta-2 Microglobulin: 1.57 mg/L (ref 1.01–1.73)

## 2011-12-18 ENCOUNTER — Ambulatory Visit: Payer: Medicare Other | Admitting: Oncology

## 2011-12-23 ENCOUNTER — Encounter: Payer: Medicare Other | Admitting: Emergency Medicine

## 2011-12-23 ENCOUNTER — Ambulatory Visit (HOSPITAL_BASED_OUTPATIENT_CLINIC_OR_DEPARTMENT_OTHER): Payer: Medicare Other | Admitting: Oncology

## 2011-12-23 ENCOUNTER — Telehealth: Payer: Self-pay | Admitting: Oncology

## 2011-12-23 VITALS — BP 110/76 | HR 63 | Temp 98.5°F | Resp 20 | Ht 72.0 in | Wt 342.0 lb

## 2011-12-23 DIAGNOSIS — Z7901 Long term (current) use of anticoagulants: Secondary | ICD-10-CM | POA: Diagnosis not present

## 2011-12-23 DIAGNOSIS — Z86711 Personal history of pulmonary embolism: Secondary | ICD-10-CM

## 2011-12-23 DIAGNOSIS — C8587 Other specified types of non-Hodgkin lymphoma, spleen: Secondary | ICD-10-CM | POA: Diagnosis not present

## 2011-12-23 DIAGNOSIS — Z87898 Personal history of other specified conditions: Secondary | ICD-10-CM
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
## 2011-12-23 NOTE — Telephone Encounter (Signed)
gve the pt his nov,feb,may,aug 2014 appt calendar along with the pet scan appt.

## 2012-01-01 ENCOUNTER — Ambulatory Visit (INDEPENDENT_AMBULATORY_CARE_PROVIDER_SITE_OTHER): Payer: Medicare Other | Admitting: Pharmacist

## 2012-01-01 DIAGNOSIS — I2699 Other pulmonary embolism without acute cor pulmonale: Secondary | ICD-10-CM

## 2012-01-01 LAB — POCT INR: INR: 2.6

## 2012-01-29 DIAGNOSIS — B351 Tinea unguium: Secondary | ICD-10-CM | POA: Diagnosis not present

## 2012-01-29 DIAGNOSIS — M79609 Pain in unspecified limb: Secondary | ICD-10-CM | POA: Diagnosis not present

## 2012-02-03 ENCOUNTER — Other Ambulatory Visit: Payer: Self-pay | Admitting: Physician Assistant

## 2012-02-12 ENCOUNTER — Ambulatory Visit (INDEPENDENT_AMBULATORY_CARE_PROVIDER_SITE_OTHER): Payer: Medicare Other | Admitting: *Deleted

## 2012-02-12 DIAGNOSIS — I2699 Other pulmonary embolism without acute cor pulmonale: Secondary | ICD-10-CM

## 2012-02-12 LAB — POCT INR: INR: 2.8

## 2012-02-17 DIAGNOSIS — M1710 Unilateral primary osteoarthritis, unspecified knee: Secondary | ICD-10-CM | POA: Diagnosis not present

## 2012-03-05 ENCOUNTER — Other Ambulatory Visit (HOSPITAL_BASED_OUTPATIENT_CLINIC_OR_DEPARTMENT_OTHER): Payer: Medicare Other | Admitting: Lab

## 2012-03-05 DIAGNOSIS — Z23 Encounter for immunization: Secondary | ICD-10-CM | POA: Diagnosis not present

## 2012-03-05 DIAGNOSIS — C8587 Other specified types of non-Hodgkin lymphoma, spleen: Secondary | ICD-10-CM

## 2012-03-05 DIAGNOSIS — Z87898 Personal history of other specified conditions: Secondary | ICD-10-CM

## 2012-03-05 LAB — COMPREHENSIVE METABOLIC PANEL (CC13)
ALT: 23 U/L (ref 0–55)
AST: 17 U/L (ref 5–34)
Albumin: 3.8 g/dL (ref 3.5–5.0)
Alkaline Phosphatase: 63 U/L (ref 40–150)
BUN: 15 mg/dL (ref 7.0–26.0)
CO2: 27 mEq/L (ref 22–29)
Calcium: 9.3 mg/dL (ref 8.4–10.4)
Chloride: 106 mEq/L (ref 98–107)
Creatinine: 0.6 mg/dL — ABNORMAL LOW (ref 0.7–1.3)
Glucose: 98 mg/dl (ref 70–99)
Potassium: 4.5 mEq/L (ref 3.5–5.1)
Sodium: 139 mEq/L (ref 136–145)
Total Bilirubin: 0.32 mg/dL (ref 0.20–1.20)
Total Protein: 6.5 g/dL (ref 6.4–8.3)

## 2012-03-05 LAB — CBC WITH DIFFERENTIAL/PLATELET
BASO%: 0.8 % (ref 0.0–2.0)
Basophils Absolute: 0.1 10*3/uL (ref 0.0–0.1)
EOS%: 6.3 % (ref 0.0–7.0)
Eosinophils Absolute: 0.4 10*3/uL (ref 0.0–0.5)
HCT: 41.9 % (ref 38.4–49.9)
HGB: 14.5 g/dL (ref 13.0–17.1)
LYMPH%: 12.9 % — ABNORMAL LOW (ref 14.0–49.0)
MCH: 31.3 pg (ref 27.2–33.4)
MCHC: 34.7 g/dL (ref 32.0–36.0)
MCV: 90.4 fL (ref 79.3–98.0)
MONO#: 0.9 10*3/uL (ref 0.1–0.9)
MONO%: 13.2 % (ref 0.0–14.0)
NEUT#: 4.6 10*3/uL (ref 1.5–6.5)
NEUT%: 66.8 % (ref 39.0–75.0)
Platelets: 326 10*3/uL (ref 140–400)
RBC: 4.64 10*6/uL (ref 4.20–5.82)
RDW: 14.1 % (ref 11.0–14.6)
WBC: 6.8 10*3/uL (ref 4.0–10.3)
lymph#: 0.9 10*3/uL (ref 0.9–3.3)

## 2012-03-05 LAB — LACTATE DEHYDROGENASE (CC13): LDH: 114 U/L — ABNORMAL LOW (ref 125–220)

## 2012-03-05 LAB — URIC ACID (CC13): Uric Acid, Serum: 5.6 mg/dl (ref 2.6–7.4)

## 2012-03-08 LAB — BETA 2 MICROGLOBULIN, SERUM: Beta-2 Microglobulin: 1.5 mg/L (ref 1.01–1.73)

## 2012-03-24 ENCOUNTER — Telehealth: Payer: Self-pay | Admitting: Oncology

## 2012-03-24 NOTE — Telephone Encounter (Signed)
lmonvm adviisng the pt of his cancelled aug 7th appt that has been moved to 12/13/2012 due to a change in the md's  Schedule.

## 2012-03-25 ENCOUNTER — Other Ambulatory Visit: Payer: Self-pay | Admitting: Oncology

## 2012-03-25 ENCOUNTER — Ambulatory Visit (INDEPENDENT_AMBULATORY_CARE_PROVIDER_SITE_OTHER): Payer: Medicare Other | Admitting: *Deleted

## 2012-03-25 DIAGNOSIS — I2699 Other pulmonary embolism without acute cor pulmonale: Secondary | ICD-10-CM | POA: Diagnosis not present

## 2012-03-25 LAB — POCT INR: INR: 1.9

## 2012-04-05 DIAGNOSIS — M79609 Pain in unspecified limb: Secondary | ICD-10-CM | POA: Diagnosis not present

## 2012-04-05 DIAGNOSIS — B351 Tinea unguium: Secondary | ICD-10-CM | POA: Diagnosis not present

## 2012-04-05 DIAGNOSIS — L988 Other specified disorders of the skin and subcutaneous tissue: Secondary | ICD-10-CM | POA: Diagnosis not present

## 2012-04-23 ENCOUNTER — Ambulatory Visit (INDEPENDENT_AMBULATORY_CARE_PROVIDER_SITE_OTHER): Payer: Medicare Other | Admitting: Family Medicine

## 2012-04-23 VITALS — BP 122/80 | HR 91 | Temp 98.5°F | Resp 20 | Ht 70.0 in | Wt 343.0 lb

## 2012-04-23 DIAGNOSIS — L02419 Cutaneous abscess of limb, unspecified: Secondary | ICD-10-CM

## 2012-04-23 DIAGNOSIS — L03115 Cellulitis of right lower limb: Secondary | ICD-10-CM

## 2012-04-23 DIAGNOSIS — L03119 Cellulitis of unspecified part of limb: Secondary | ICD-10-CM

## 2012-04-23 LAB — POCT CBC
Granulocyte percent: 89.8 %G — AB (ref 37–80)
HCT, POC: 46.8 % (ref 43.5–53.7)
Hemoglobin: 14.7 g/dL (ref 14.1–18.1)
Lymph, poc: 1.2 (ref 0.6–3.4)
MCH, POC: 29.6 pg (ref 27–31.2)
MCHC: 31.4 g/dL — AB (ref 31.8–35.4)
MCV: 94.2 fL (ref 80–97)
MID (cbc): 1.1 — AB (ref 0–0.9)
MPV: 9.7 fL (ref 0–99.8)
POC Granulocyte: 20.3 — AB (ref 2–6.9)
POC LYMPH PERCENT: 5.5 %L — AB (ref 10–50)
POC MID %: 4.7 %M (ref 0–12)
Platelet Count, POC: 319 10*3/uL (ref 142–424)
RBC: 4.97 M/uL (ref 4.69–6.13)
RDW, POC: 14.7 %
WBC: 22.6 10*3/uL — AB (ref 4.6–10.2)

## 2012-04-23 MED ORDER — CEPHALEXIN 500 MG PO CAPS: 500.0000 mg | ORAL_CAPSULE | Freq: Three times a day (TID) | ORAL | Status: DC

## 2012-04-23 NOTE — Patient Instructions (Addendum)
Recheck 48 hours.Cellulitis Cellulitis is an infection of the skin and the tissue beneath it. The infected area is usually red and tender. Cellulitis occurs most often in the arms and lower legs.  CAUSES  Cellulitis is caused by bacteria that enter the skin through cracks or cuts in the skin. The most common types of bacteria that cause cellulitis are Staphylococcus and Streptococcus. SYMPTOMS   Redness and warmth.  Swelling.  Tenderness or pain.  Fever. DIAGNOSIS  Your caregiver can usually determine what is wrong based on a physical exam. Blood tests may also be done. TREATMENT  Treatment usually involves taking an antibiotic medicine. HOME CARE INSTRUCTIONS   Take your antibiotics as directed. Finish them even if you start to feel better.  Keep the infected arm or leg elevated to reduce swelling.  Apply a warm cloth to the affected area up to 4 times per day to relieve pain.  Only take over-the-counter or prescription medicines for pain, discomfort, or fever as directed by your caregiver.  Keep all follow-up appointments as directed by your caregiver. SEEK MEDICAL CARE IF:   You notice red streaks coming from the infected area.  Your red area gets larger or turns dark in color.  Your bone or joint underneath the infected area becomes painful after the skin has healed.  Your infection returns in the same area or another area.  You notice a swollen bump in the infected area.  You develop new symptoms. SEEK IMMEDIATE MEDICAL CARE IF:   You have a fever.  You feel very sleepy.  You develop vomiting or diarrhea.  You have a general ill feeling (malaise) with muscle aches and pains. MAKE SURE YOU:   Understand these instructions.  Will watch your condition.  Will get help right away if you are not doing well or get worse. Document Released: 01/29/2005 Document Revised: 10/21/2011 Document Reviewed: 07/07/2011 [facility] Patient Information 2013 [place],  [place].

## 2012-04-23 NOTE — Progress Notes (Signed)
66 yo with 1 day  Of progressive redness, warmth, and swelling RLE.  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  Objective:  NAD Right lower leg is swollen, red and warm. No calf or thigh tenderness. Chest:  Clear Heart:  Grade I/VI systolic ejection type murmur WBC= 22K Results for orders placed in visit on 03/25/12  POCT INR      Component Value Range   INR 1.9     Results for orders placed in visit on 04/23/12  POCT CBC      Component Value Range   WBC 22.6 (*) 4.6 - 10.2 K/uL   Lymph, poc 1.2  0.6 - 3.4   POC LYMPH PERCENT 5.5 (*) 10 - 50 %L   MID (cbc) 1.1 (*) 0 - 0.9   POC MID % 4.7  0 - 12 %M   POC Granulocyte 20.3 (*) 2 - 6.9   Granulocyte percent 89.8 (*) 37 - 80 %G   RBC 4.97  4.69 - 6.13 M/uL   Hemoglobin 14.7  14.1 - 18.1 g/dL   HCT, POC 16.1  09.6 - 53.7 %   MCV 94.2  80 - 97 fL   MCH, POC 29.6  27 - 31.2 pg   MCHC 31.4 (*) 31.8 - 35.4 g/dL   RDW, POC 04.5     Platelet Count, POC 319  142 - 424 K/uL   MPV 9.7  0 - 99.8 fL      Assessment:  Cellulitis RLE  Plan: Keflex Follow up 48 hours with Repeat INR and evaluation 1. Cellulitis of leg without foot, right  POCT CBC, cephALEXin (KEFLEX) 500 MG capsule

## 2012-04-25 ENCOUNTER — Ambulatory Visit (INDEPENDENT_AMBULATORY_CARE_PROVIDER_SITE_OTHER): Payer: Medicare Other | Admitting: Emergency Medicine

## 2012-04-25 VITALS — BP 124/81 | HR 65 | Temp 97.7°F | Resp 18 | Ht 71.0 in | Wt 345.8 lb

## 2012-04-25 DIAGNOSIS — M79609 Pain in unspecified limb: Secondary | ICD-10-CM

## 2012-04-25 DIAGNOSIS — M79604 Pain in right leg: Secondary | ICD-10-CM

## 2012-04-25 DIAGNOSIS — M79669 Pain in unspecified lower leg: Secondary | ICD-10-CM

## 2012-04-25 DIAGNOSIS — I890 Lymphedema, not elsewhere classified: Secondary | ICD-10-CM | POA: Diagnosis not present

## 2012-04-25 LAB — POCT CBC
Granulocyte percent: 66.8 %G (ref 37–80)
HCT, POC: 41.7 % — AB (ref 43.5–53.7)
Hemoglobin: 13 g/dL — AB (ref 14.1–18.1)
Lymph, poc: 1.4 (ref 0.6–3.4)
MCH, POC: 29.3 pg (ref 27–31.2)
MCHC: 31.2 g/dL — AB (ref 31.8–35.4)
MCV: 94.2 fL (ref 80–97)
MID (cbc): 1.4 — AB (ref 0–0.9)
MPV: 10.1 fL (ref 0–99.8)
POC Granulocyte: 5.5 (ref 2–6.9)
POC LYMPH PERCENT: 16.5 %L (ref 10–50)
POC MID %: 16.7 %M — AB (ref 0–12)
Platelet Count, POC: 238 10*3/uL (ref 142–424)
RBC: 4.43 M/uL — AB (ref 4.69–6.13)
RDW, POC: 15 %
WBC: 8.2 10*3/uL (ref 4.6–10.2)

## 2012-04-25 NOTE — Progress Notes (Signed)
  Subjective:    Patient ID: Marc Mills, male    DOB: 02-02-1946, 66 y.o.   MRN: [id number]  HPI patient here for recheck on right leg.  Feels better and swelling and redness has gone down.     Review of Systems     Objective:   Physical Exam the right lower leg is red and swollen. He has a shiny look to it. There is increased warmth to touch. There is no posterior swelling noted   Results for orders placed in visit on 04/25/12  POCT CBC      Component Value Range   WBC 8.2  4.6 - 10.2 K/uL   Lymph, poc 1.4  0.6 - 3.4   POC LYMPH PERCENT 16.5  10 - 50 %L   MID (cbc) 1.4 (*) 0 - 0.9   POC MID % 16.7 (*) 0 - 12 %M   POC Granulocyte 5.5  2 - 6.9   Granulocyte percent 66.8  37 - 80 %G   RBC 4.43 (*) 4.69 - 6.13 M/uL   Hemoglobin 13.0 (*) 14.1 - 18.1 g/dL   HCT, POC 40.9 (*) 81.1 - 53.7 %   MCV 94.2  80 - 97 fL   MCH, POC 29.3  27 - 31.2 pg   MCHC 31.2 (*) 31.8 - 35.4 g/dL   RDW, POC 91.4     Platelet Count, POC 238  142 - 424 K/uL   MPV 10.1  0 - 99.8 fL       Assessment & Plan:  Patient is significantly better on current dose cephalexin. We talked about given a Rocephin shot but I'm going to hold off since his white count is down to 8200

## 2012-04-29 ENCOUNTER — Ambulatory Visit (INDEPENDENT_AMBULATORY_CARE_PROVIDER_SITE_OTHER): Payer: Medicare Other | Admitting: Emergency Medicine

## 2012-04-29 ENCOUNTER — Encounter (HOSPITAL_COMMUNITY): Payer: Self-pay | Admitting: Family Medicine

## 2012-04-29 ENCOUNTER — Inpatient Hospital Stay (HOSPITAL_COMMUNITY)
Admission: AD | Admit: 2012-04-29 | Discharge: 2012-05-01 | DRG: 603 | Disposition: A | Discharge status: Home / Self Care | Payer: Medicare Other | Source: Ambulatory Visit | Attending: Family Medicine | Admitting: Family Medicine

## 2012-04-29 VITALS — BP 125/79 | HR 68 | Temp 97.3°F | Resp 16 | Ht 70.5 in | Wt 346.0 lb

## 2012-04-29 DIAGNOSIS — L02419 Cutaneous abscess of limb, unspecified: Principal | ICD-10-CM | POA: Diagnosis present

## 2012-04-29 DIAGNOSIS — L03119 Cellulitis of unspecified part of limb: Principal | ICD-10-CM | POA: Diagnosis present

## 2012-04-29 DIAGNOSIS — M79609 Pain in unspecified limb: Secondary | ICD-10-CM | POA: Diagnosis not present

## 2012-04-29 DIAGNOSIS — I2699 Other pulmonary embolism without acute cor pulmonale: Secondary | ICD-10-CM | POA: Insufficient documentation

## 2012-04-29 DIAGNOSIS — Z79899 Other long term (current) drug therapy: Secondary | ICD-10-CM

## 2012-04-29 DIAGNOSIS — Z9089 Acquired absence of other organs: Secondary | ICD-10-CM

## 2012-04-29 DIAGNOSIS — G4733 Obstructive sleep apnea (adult) (pediatric): Secondary | ICD-10-CM | POA: Diagnosis present

## 2012-04-29 DIAGNOSIS — Z9081 Acquired absence of spleen: Secondary | ICD-10-CM | POA: Insufficient documentation

## 2012-04-29 DIAGNOSIS — L03115 Cellulitis of right lower limb: Secondary | ICD-10-CM

## 2012-04-29 DIAGNOSIS — Z87891 Personal history of nicotine dependence: Secondary | ICD-10-CM

## 2012-04-29 DIAGNOSIS — Z87898 Personal history of other specified conditions: Secondary | ICD-10-CM

## 2012-04-29 DIAGNOSIS — Z6841 Body Mass Index (BMI) 40.0 and over, adult: Secondary | ICD-10-CM

## 2012-04-29 DIAGNOSIS — M79604 Pain in right leg: Secondary | ICD-10-CM

## 2012-04-29 DIAGNOSIS — Z86718 Personal history of other venous thrombosis and embolism: Secondary | ICD-10-CM

## 2012-04-29 DIAGNOSIS — L02619 Cutaneous abscess of unspecified foot: Secondary | ICD-10-CM

## 2012-04-29 DIAGNOSIS — I8390 Asymptomatic varicose veins of unspecified lower extremity: Secondary | ICD-10-CM | POA: Diagnosis present

## 2012-04-29 DIAGNOSIS — Z86711 Personal history of pulmonary embolism: Secondary | ICD-10-CM

## 2012-04-29 DIAGNOSIS — R609 Edema, unspecified: Secondary | ICD-10-CM

## 2012-04-29 DIAGNOSIS — M129 Arthropathy, unspecified: Secondary | ICD-10-CM | POA: Diagnosis present

## 2012-04-29 DIAGNOSIS — Z7901 Long term (current) use of anticoagulants: Secondary | ICD-10-CM

## 2012-04-29 HISTORY — DX: Anorectal fistula, unspecified: K60.50

## 2012-04-29 HISTORY — DX: Acute embolism and thrombosis of unspecified deep veins of unspecified lower extremity: I82.409

## 2012-04-29 HISTORY — DX: Anorectal fistula: K60.5

## 2012-04-29 LAB — PROTIME-INR
INR: 2.35 — ABNORMAL HIGH (ref 0.00–1.49)
Prothrombin Time: 24.7 seconds — ABNORMAL HIGH (ref 11.6–15.2)

## 2012-04-29 LAB — POCT CBC
Granulocyte percent: 69.1 %G (ref 37–80)
HCT, POC: 45.3 % (ref 43.5–53.7)
Hemoglobin: 14.1 g/dL (ref 14.1–18.1)
Lymph, poc: 2.2 (ref 0.6–3.4)
MCH, POC: 29.1 pg (ref 27–31.2)
MCHC: 31.1 g/dL — AB (ref 31.8–35.4)
MCV: 93.5 fL (ref 80–97)
MID (cbc): 1.3 — AB (ref 0–0.9)
MPV: 9.6 fL (ref 0–99.8)
POC Granulocyte: 7.9 — AB (ref 2–6.9)
POC LYMPH PERCENT: 19.4 %L (ref 10–50)
POC MID %: 11.5 %M (ref 0–12)
Platelet Count, POC: 486 10*3/uL — AB (ref 142–424)
RBC: 4.84 M/uL (ref 4.69–6.13)
RDW, POC: 15 %
WBC: 11.4 10*3/uL — AB (ref 4.6–10.2)

## 2012-04-29 LAB — COMPREHENSIVE METABOLIC PANEL
ALT: 19 U/L (ref 0–53)
AST: 16 U/L (ref 0–37)
Albumin: 3.2 g/dL — ABNORMAL LOW (ref 3.5–5.2)
Alkaline Phosphatase: 72 U/L (ref 39–117)
BUN: 10 mg/dL (ref 6–23)
CO2: 27 mEq/L (ref 19–32)
Calcium: 9.5 mg/dL (ref 8.4–10.5)
Chloride: 100 mEq/L (ref 96–112)
Creatinine, Ser: 0.58 mg/dL (ref 0.50–1.35)
GFR calc Af Amer: >90 mL/min (ref >90)
GFR calc non Af Amer: >90 mL/min (ref >90)
Glucose, Bld: 106 mg/dL — ABNORMAL HIGH (ref 70–99)
Potassium: 3.9 mEq/L (ref 3.5–5.1)
Sodium: 140 mEq/L (ref 135–145)
Total Bilirubin: 0.3 mg/dL (ref 0.3–1.2)
Total Protein: 7.3 g/dL (ref 6.0–8.3)

## 2012-04-29 MED ORDER — CITALOPRAM HYDROBROMIDE 20 MG PO TABS
20.0000 mg | ORAL_TABLET | Freq: Every day | ORAL | Status: DC
  Administered 2012-04-29 – 2012-04-30 (×2): 20 mg via ORAL
  Filled 2012-04-29 (×3): qty 1

## 2012-04-29 MED ORDER — SODIUM CHLORIDE 0.9 % IV SOLN
INTRAVENOUS | Status: DC
  Administered 2012-04-29: 200 mL via INTRAVENOUS

## 2012-04-29 MED ORDER — LUBRIDERM SERIOUSLY SENSITIVE EX LOTN
TOPICAL_LOTION | Freq: Two times a day (BID) | CUTANEOUS | Status: DC
  Administered 2012-04-29: 1 via TOPICAL
  Administered 2012-04-30 – 2012-05-01 (×3): via TOPICAL
  Filled 2012-04-29 (×2): qty 562

## 2012-04-29 MED ORDER — POLYETHYLENE GLYCOL 3350 17 G PO PACK
17.0000 g | PACK | Freq: Every day | ORAL | Status: DC | PRN
  Filled 2012-04-29: qty 1

## 2012-04-29 MED ORDER — ONDANSETRON HCL 4 MG/2ML IJ SOLN: 4.0000 mg | Freq: Four times a day (QID) | INTRAMUSCULAR | Status: DC | PRN

## 2012-04-29 MED ORDER — ACETAMINOPHEN 650 MG RE SUPP: 650.0000 mg | Freq: Four times a day (QID) | RECTAL | Status: DC | PRN

## 2012-04-29 MED ORDER — ACETAMINOPHEN 325 MG PO TABS: 650.0000 mg | ORAL_TABLET | Freq: Four times a day (QID) | ORAL | Status: DC | PRN

## 2012-04-29 MED ORDER — ONDANSETRON HCL 4 MG PO TABS: 4.0000 mg | ORAL_TABLET | Freq: Four times a day (QID) | ORAL | Status: DC | PRN

## 2012-04-29 MED ORDER — VANCOMYCIN HCL 10 G IV SOLR
1500.0000 mg | Freq: Two times a day (BID) | INTRAVENOUS | Status: DC
  Administered 2012-04-30: 1500 mg via INTRAVENOUS
  Filled 2012-04-29 (×2): qty 1500

## 2012-04-29 MED ORDER — WARFARIN SODIUM 5 MG PO TABS
5.0000 mg | ORAL_TABLET | Freq: Every day | ORAL | Status: DC
  Administered 2012-04-29 – 2012-04-30 (×2): 5 mg via ORAL
  Filled 2012-04-29 (×3): qty 1

## 2012-04-29 MED ORDER — WARFARIN - PHARMACIST DOSING INPATIENT: Freq: Every day | Status: DC

## 2012-04-29 MED ORDER — VANCOMYCIN HCL 10 G IV SOLR
2000.0000 mg | INTRAVENOUS | Status: AC
  Administered 2012-04-29: 2000 mg via INTRAVENOUS
  Filled 2012-04-29: qty 2000

## 2012-04-29 NOTE — Progress Notes (Signed)
Late Entry: Attempted two peripheral IV sticks on patient at 16:15, to initiate IV antibiotic therapy.  Attempts were unsuccessful. Immediately paged IV team, requested initiation of peripheral IV. IV team came to patient's bedside at earliest availability, gained IV access at 18:00. Began IV antibiotic therapy at 18:15. Fayne Norrie, RN
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
## 2012-04-29 NOTE — Progress Notes (Signed)
  Subjective:    Patient ID: Marc Mills, male    DOB: 03/05/1946, 66 y.o.   MRN: [id number]  HPI Pt presents to clinic today to recheck his Rt leg cellulitis. He states his Rt leg is hot to the touch and he has headaches at night time. He does not have any nausea, vomiting, or fever. The patient states he feels fine but is right leg feels incredibly hot .   Review of Systems     Objective:   Physical Exam there is significant redness and increased warmth over the anterior portion of the right leg. There is no popliteal pain. The area is warm to touch and tender over the skin.     Results for orders placed in visit on 04/29/12  POCT CBC      Component Value Range   WBC 11.4 (*) 4.6 - 10.2 K/uL   Lymph, poc 2.2  0.6 - 3.4   POC LYMPH PERCENT 19.4  10 - 50 %L   MID (cbc) 1.3 (*) 0 - 0.9   POC MID % 11.5  0 - 12 %M   POC Granulocyte 7.9 (*) 2 - 6.9   Granulocyte percent 69.1  37 - 80 %G   RBC 4.84  4.69 - 6.13 M/uL   Hemoglobin 14.1  14.1 - 18.1 g/dL   HCT, POC 78.2  95.6 - 53.7 %   MCV 93.5  80 - 97 fL   MCH, POC 29.1  27 - 31.2 pg   MCHC 31.1 (*) 31.8 - 35.4 g/dL   RDW, POC 21.3     Platelet Count, POC 486 (*) 142 - 424 K/uL   MPV 9.6  0 - 99.8 fL          Assessment & Plan:    We'll go ahead and admit to family medicine. We'll consider doing Doppler neck shows no clot in the leg. Treat with IV antibiotics.

## 2012-04-29 NOTE — Progress Notes (Signed)
VASCULAR LAB PRELIMINARY  PRELIMINARY  PRELIMINARY  PRELIMINARY  Right lower extremity venous Doppler completed.    Preliminary report:  There is no DVT or SVT noted in the right lower extremity.  Marc Mills, RVT 04/29/2012, 2:55 PM

## 2012-04-29 NOTE — Progress Notes (Signed)
ANTICOAGULATION CONSULT NOTE - Follow Up Consult  Pharmacy Consult for Coumadin Indication: hx DVT/PE  Allergies  Allergen Reactions  . Hydrocodone Itching and Other (See Comments)    Gives me chills  . Oxycodone Itching and Other (See Comments)    Gives me chills    Patient Measurements: Height: 5\' 11"  (180.3 cm) Weight: 345 lb 7 oz (156.689 kg) IBW/kg (Calculated) : 75.3   Vital Signs: Temp: 98.4 F (36.9 C) (12/26 1222) Temp src: Oral (12/26 1222) BP: 168/88 mmHg (12/26 1222) Pulse Rate: 79  (12/26 1222)  Labs:  Basename 04/29/12 1739 04/29/12 1002  HGB -- 14.1  HCT -- 45.3  PLT -- --  APTT -- --  LABPROT 24.7* --  INR 2.35* --  HEPARINUNFRC -- --  CREATININE 0.58 --  CKTOTAL -- --  CKMB -- --  TROPONINI -- --    Estimated Creatinine Clearance: 138.6 ml/min (by C-G formula based on Cr of 0.58).  Assessment:   INR now resulted and at goal.  Home regimen: 5 mg daily. Last dose 12/25 at home. Patient reports consistently therapeutic INR on this regimen.    Goal of Therapy:  INR 2-3 Monitor platelets by anticoagulation protocol: Yes   Plan:   Continue Coumadin 5 mg daily as at home.  Daily PT/INR for now.  Dennie Fetters, RPh Pager: [phone] 04/29/2012,6:50 PM

## 2012-04-30 ENCOUNTER — Encounter (HOSPITAL_COMMUNITY): Payer: Self-pay

## 2012-04-30 DIAGNOSIS — Z86711 Personal history of pulmonary embolism: Secondary | ICD-10-CM | POA: Diagnosis not present

## 2012-04-30 DIAGNOSIS — L03119 Cellulitis of unspecified part of limb: Secondary | ICD-10-CM | POA: Diagnosis not present

## 2012-04-30 DIAGNOSIS — L03115 Cellulitis of right lower limb: Secondary | ICD-10-CM | POA: Diagnosis present

## 2012-04-30 DIAGNOSIS — Z79899 Other long term (current) drug therapy: Secondary | ICD-10-CM | POA: Diagnosis not present

## 2012-04-30 DIAGNOSIS — M129 Arthropathy, unspecified: Secondary | ICD-10-CM | POA: Diagnosis present

## 2012-04-30 DIAGNOSIS — Z86718 Personal history of other venous thrombosis and embolism: Secondary | ICD-10-CM | POA: Diagnosis not present

## 2012-04-30 DIAGNOSIS — G4733 Obstructive sleep apnea (adult) (pediatric): Secondary | ICD-10-CM | POA: Diagnosis present

## 2012-04-30 DIAGNOSIS — Z7901 Long term (current) use of anticoagulants: Secondary | ICD-10-CM | POA: Diagnosis not present

## 2012-04-30 DIAGNOSIS — L02419 Cutaneous abscess of limb, unspecified: Secondary | ICD-10-CM | POA: Diagnosis present

## 2012-04-30 DIAGNOSIS — I8390 Asymptomatic varicose veins of unspecified lower extremity: Secondary | ICD-10-CM | POA: Diagnosis present

## 2012-04-30 DIAGNOSIS — Z6841 Body Mass Index (BMI) 40.0 and over, adult: Secondary | ICD-10-CM | POA: Diagnosis not present

## 2012-04-30 DIAGNOSIS — Z9089 Acquired absence of other organs: Secondary | ICD-10-CM | POA: Diagnosis not present

## 2012-04-30 DIAGNOSIS — Z87898 Personal history of other specified conditions: Secondary | ICD-10-CM | POA: Diagnosis not present

## 2012-04-30 DIAGNOSIS — L02619 Cutaneous abscess of unspecified foot: Secondary | ICD-10-CM | POA: Diagnosis not present

## 2012-04-30 DIAGNOSIS — Z87891 Personal history of nicotine dependence: Secondary | ICD-10-CM | POA: Diagnosis not present

## 2012-04-30 LAB — CBC
HCT: 39 % (ref 39.0–52.0)
Hemoglobin: 12.8 g/dL — ABNORMAL LOW (ref 13.0–17.0)
MCH: 29.1 pg (ref 26.0–34.0)
MCHC: 32.8 g/dL (ref 30.0–36.0)
MCV: 88.6 fL (ref 78.0–100.0)
Platelets: 477 10*3/uL — ABNORMAL HIGH (ref 150–400)
RBC: 4.4 MIL/uL (ref 4.22–5.81)
RDW: 14.8 % (ref 11.5–15.5)
WBC: 9.6 10*3/uL (ref 4.0–10.5)

## 2012-04-30 LAB — BASIC METABOLIC PANEL
BUN: 13 mg/dL (ref 6–23)
CO2: 27 mEq/L (ref 19–32)
Calcium: 9 mg/dL (ref 8.4–10.5)
Chloride: 103 mEq/L (ref 96–112)
Creatinine, Ser: 0.64 mg/dL (ref 0.50–1.35)
GFR calc Af Amer: >90 mL/min (ref >90)
GFR calc non Af Amer: >90 mL/min (ref >90)
Glucose, Bld: 102 mg/dL — ABNORMAL HIGH (ref 70–99)
Potassium: 4.5 mEq/L (ref 3.5–5.1)
Sodium: 140 mEq/L (ref 135–145)

## 2012-04-30 LAB — PROTIME-INR
INR: 2.29 — ABNORMAL HIGH (ref 0.00–1.49)
Prothrombin Time: 24.2 seconds — ABNORMAL HIGH (ref 11.6–15.2)

## 2012-04-30 MED ORDER — DOXYCYCLINE HYCLATE 100 MG PO TABS
100.0000 mg | ORAL_TABLET | Freq: Two times a day (BID) | ORAL | Status: DC
  Administered 2012-04-30 – 2012-05-01 (×3): 100 mg via ORAL
  Filled 2012-04-30 (×4): qty 1

## 2012-04-30 NOTE — Progress Notes (Signed)
Family Medicine Teaching Service Attending Note  I interviewed and examined patient Marc Mills and reviewed their tests and x-rays.  I discussed with Dr. Claiborne Billings and reviewed their note for today.  I agree with their assessment and plan.     Additionally  Improving witout evidence of dvt.  Change to oral antibiotics and plan on discharge in am if continued improvement

## 2012-04-30 NOTE — Progress Notes (Signed)
ANTICOAGULATION CONSULT NOTE - Follow Up Consult  Pharmacy Consult for coumadin and vancomycin Indication: Hx DVT/PE; right leg cellulitis  Allergies  Allergen Reactions  . Hydrocodone Itching and Other (See Comments)    Gives me chills  . Oxycodone Itching and Other (See Comments)    Gives me chills    Patient Measurements: Height: 5\' 11"  (180.3 cm) Weight: 346 lb 9 oz (157.2 kg) IBW/kg (Calculated) : 75.3    Vital Signs: Temp: 97.7 F (36.5 C) (12/27 0853) Temp src: Oral (12/27 0853) BP: 126/81 mmHg (12/27 0853) Pulse Rate: 78  (12/27 0853)  Labs:  Basename 04/30/12 0540 04/29/12 1739 04/29/12 1002  HGB 12.8* -- 14.1  HCT 39.0 -- 45.3  PLT 477* -- --  APTT -- -- --  LABPROT -- 24.7* --  INR -- 2.35* --  HEPARINUNFRC -- -- --  CREATININE 0.64 0.58 --  CKTOTAL -- -- --  CKMB -- -- --  TROPONINI -- -- --    Estimated Creatinine Clearance: 138.9 ml/min (by C-G formula based on Cr of 0.64).  Assessment: Patient is a 66 y.o M on coumadin PTA for hx DVT/PE. INR is therapeutic today at 2.29. Patient was on keflex PTA for right foot cellulitis and was getting worse over the past few days.  He was referred to the ED yesterday for further workup.  LE doppler was negative for DVT.  Vancomycin 2000mg  load and 1500 mg IV q12h started yesterday for cellulitis.  Vancomycin now d/ced per MD and switched to doxycycline.  Goal of Therapy:  INR 2-3; vancomycin trough level 10-15  Plan:  1) continue home coumadin regimen  Kashtyn Jankowski P 04/30/2012,11:11 AM

## 2012-04-30 NOTE — Discharge Summary (Signed)
Physician Discharge Summary  Patient ID: Marc Mills MRN: [id number] DOB/AGE: 05-17-1945 66 y.o.  Admit date: 04/29/2012 Discharge date: 05/01/2012  Admission Diagnoses: Cellulitis right Lower extremity  Discharge Diagnoses:  Active Problems:  Cellulitis of right lower extremity   Discharged Condition: good  Hospital Course:  [name] is a 66 y.o. year old male with a history of DVT & PE (chronically on coumadin) presenting with 6 days of right lower extremity swelling that initially improved on keflex, but has been progressively worsening in the last 3 days. Treated with Vancomycin for 1 day, then transitioned to Doxycycline for 10 days. Following blood cultures. RLE doppler r/o acute DVT.    Hx of VTE: s/p IVC filter (May 2011), on chronic Coumadin  PT/INR: 2.35, therapeutic. Coumadin continued during admission. Follow up closely outpatient with INR status while on antibiotic.    Consults: None  Significant Diagnostic Studies:    Treatments: IV hydration, Vanc and doxycycline   Discharge Exam: Blood pressure 135/74, pulse 74, temperature 97.9 F (36.6 C), temperature source Oral, resp. rate 18, height 5\' 11"  (1.803 m), weight 349 lb 3.3 oz (158.4 kg), SpO2 98.00%. Gen: NAD. Afebrile. Extremely pleasant.  CV: RRR. No murmur.  Lungs: CTAB. No wheezing, rhonchi or rales.  Abd: Soft. NT.ND. No HSM  EXT: RLE: Overall improvement from in erythema and swelling. Still markedly swollen around ankle. Patient has been keeping the leg elevated.   Disposition: Home today with Doxycycline 10 day total treatment      Discharge Orders    Future Appointments: Provider: Department: Dept Phone: Center:   05/06/2012 8:30 AM [facility] [phone] None   06/11/2012 8:15 AM [facility] MEDICAL ONCOLOGY [phone] None   09/03/2012 8:15 AM [facility] MEDICAL ONCOLOGY  [phone] None   10/18/2012 9:00 AM Waymon Budge, MD [facility] [phone] None   12/03/2012 8:00 AM [facility] MEDICAL ONCOLOGY [phone] None   12/03/2012 8:30 AM [facility] [phone] [facility] LONG   12/13/2012 11:00 AM Lowella Dell, MD [facility] MEDICAL ONCOLOGY [phone] None       Medication List     As of 05/01/2012  7:29 AM    TAKE these medications         acetaminophen 500 MG tablet   Commonly known as: TYLENOL   Take 1,000 mg by mouth as needed. For pain      b complex vitamins tablet   Take 1 tablet by mouth at bedtime.      calcium carbonate 600 MG Tabs   Commonly known as: OS-CAL   Take 600 mg by mouth at bedtime.      cholecalciferol 1000 UNITS tablet   Commonly known as: VITAMIN D   Take 1,000 Units by mouth at bedtime.      Cinnamon 500 MG Tabs   Take 2 tablets by mouth at bedtime. Hold while in hospital      citalopram 20 MG tablet   Commonly known as: CELEXA   Take 20 mg by mouth at bedtime.      doxycycline 100 MG tablet   Commonly known as: VIBRA-TABS   Take 1 tablet (100 mg total) by mouth every 12 (twelve) hours.      glucosamine-chondroitin 500-400 MG tablet   Take 2 tablets by mouth daily. Hold while in hospital  multivitamin tablet   Take 1 tablet by mouth at bedtime.      sildenafil 100 MG tablet   Commonly known as: VIAGRA   TAKE 1/2-1 TABLET 1 HOUR PRIOR TO INTERCOURSE, NEEDS OFFICE VISIT FOR MORE      warfarin 5 MG tablet   Commonly known as: COUMADIN   Take 5 mg by mouth at bedtime.         Follow-up Information    Follow up with DAUB, STEVE A, MD. Schedule an appointment as soon as possible for a visit in 1 week.   Contact information:   [address] [place] [address] [phone]       Schedule an appointment as soon as possible for a visit to follow up. (Get your INR checked on Monday!)         Outpatient Recommendations:  - Follow up with INR on Monday 12/27, follow closely while on antibiotic - Doxycycline x10 days.   Signed: Halden Phegley 05/01/2012, 7:29 AM

## 2012-04-30 NOTE — Progress Notes (Signed)
Patient ID: Marc Mills, male   DOB: 30-Mar-1946, 66 y.o.   MRN: [id number] [facility] Medicine Teaching Service PGY-1 Progress Note   Overnight Events: Patient reports he is doing well this morning. He has no complaints and feel good. He would like to be home by tomorrow for a dinner party he has planned. He feels he could go home, and that it has improved since admission. Pain controlled.  Objective: Temp:  [97.3 F (36.3 C)-98.5 F (36.9 C)] 98.5 F (36.9 C) (12/27 4098) Pulse Rate:  [68-79] 73  (12/27 1191) Cardiac Rhythm:  [-]  Resp:  [16-20] 18  (12/27 0608) BP: (104-168)/(67-88) 104/67 mmHg (12/27 0608) SpO2:  [94 %-98 %] 94 % (12/27 0608) Weight:  [345 lb 7 oz (156.689 kg)-346 lb 9 oz (157.2 kg)] 346 lb 9 oz (157.2 kg) (12/26 2200) Weight change:  BP 126/81  Pulse 78  Temp 97.7 F (36.5 C) (Oral)  Resp 18  Ht 5\' 11"  (1.803 m)  Wt 346 lb 9 oz (157.2 kg)  BMI 48.34 kg/m2  SpO2 95%  Physical Exam: Gen: NAD. Afebrile. Extremely pleasant.  CV: RRR. No murmur. Lungs: CTAB. No wheezing, rhonchi or rales.  Abd: Soft. NT.ND. No HSM EXT: Right leg: Improvement in erythema, receeding from marked areas. Warm to touch. No active drainage. Leg is elevated on Pillow. Pulse difficult to palpate with swelling. +1/4. Neuro: Alert and oriented. No focal deficits.  CBC    Component Value Date/Time   WBC 9.6 04/30/2012 0540   WBC 11.4* 04/29/2012 1002   WBC 6.8 03/05/2012 0811   RBC 4.40 04/30/2012 0540   RBC 4.84 04/29/2012 1002   RBC 4.64 03/05/2012 0811   HGB 12.8* 04/30/2012 0540   HGB 14.1 04/29/2012 1002   HGB 14.5 03/05/2012 0811   HCT 39.0 04/30/2012 0540   HCT 45.3 04/29/2012 1002   HCT 41.9 03/05/2012 0811   PLT 477* 04/30/2012 0540   PLT 326 03/05/2012 0811   MCV 88.6 04/30/2012 0540   MCV 93.5 04/29/2012 1002   MCV 90.4 03/05/2012 0811   MCH 29.1 04/30/2012 0540   MCH 29.1 04/29/2012 1002   MCH 31.3 03/05/2012 0811   MCHC 32.8 04/30/2012 0540   MCHC 31.1*  04/29/2012 1002   MCHC 34.7 03/05/2012 0811   RDW 14.8 04/30/2012 0540   RDW 14.1 03/05/2012 0811   LYMPHSABS 0.9 03/05/2012 0811   LYMPHSABS 0.7 12/29/2009 0430   MONOABS 0.9 03/05/2012 0811   MONOABS 1.3* 12/29/2009 0430   EOSABS 0.4 03/05/2012 0811   EOSABS 0.2 12/29/2009 0430   BASOSABS 0.1 03/05/2012 0811   BASOSABS 0.0 12/29/2009 0430    CMP     Component Value Date/Time   NA 140 04/30/2012 0540   NA 139 03/05/2012 0811   K 4.5 04/30/2012 0540   K 4.5 03/05/2012 0811   CL 103 04/30/2012 0540   CL 106 03/05/2012 0811   CO2 27 04/30/2012 0540   CO2 27 03/05/2012 0811   GLUCOSE 102* 04/30/2012 0540   GLUCOSE 98 03/05/2012 0811   BUN 13 04/30/2012 0540   BUN 15.0 03/05/2012 0811   CREATININE 0.64 04/30/2012 0540   CREATININE 0.6* 03/05/2012 0811   CREATININE 0.80 10/15/2011 1528   CALCIUM 9.0 04/30/2012 0540   CALCIUM 9.3 03/05/2012 0811   PROT 7.3 04/29/2012 1739   PROT 6.5 03/05/2012 0811   ALBUMIN 3.2* 04/29/2012 1739   ALBUMIN 3.8 03/05/2012 0811   AST 16 04/29/2012 1739   AST 17 03/05/2012  0811   ALT 19 04/29/2012 1739   ALT 23 03/05/2012 0811   ALKPHOS 72 04/29/2012 1739   ALKPHOS 63 03/05/2012 0811   BILITOT 0.3 04/29/2012 1739   BILITOT 0.32 03/05/2012 0811   GFRNONAA >90 04/30/2012 0540   GFRAA >90 04/30/2012 0540    Lab 04/29/12 1739  INR 2.35*     Assessment and Plan: KENNTH VANBENSCHOTEN is a 66 y.o. year old male with a history of DVT & PE (chronically on coumadin) presenting with 6 days of right lower extremity swelling that initially improved on keflex, but has been progressively worsening in the last 3 days.  RLE Edema/erythema: cellulitis vs DVT  - blood cultures x2 Pending - CMP: WNL - treat for presumptive cellulitis with vancomycin per pharmacy  - check RLE venous doppler to rule out DVT, given hx of DVT & PE's  - Improvement today in erythema margins. Hx of VTE: s/p IVC filter (May 2011), on chronic Coumadin  - check PT/INR: 2.35 - coumadin per pharmacy  (on Warfarin 5mg  daily as an outpatient)  - check RLE doppler: Pending  FEN/GI:  - regular diet  - KVO IV  Prophylaxis:  - on therapeutic coumadin   Dispo:  - pending clinical improvement  Code Status:  - addressed with patient - full code in the hospital but would not want to be chronically on life support

## 2012-05-01 DIAGNOSIS — L02619 Cutaneous abscess of unspecified foot: Secondary | ICD-10-CM | POA: Diagnosis not present

## 2012-05-01 LAB — PROTIME-INR
INR: 2.46 — ABNORMAL HIGH (ref 0.00–1.49)
Prothrombin Time: 25.5 seconds — ABNORMAL HIGH (ref 11.6–15.2)

## 2012-05-01 MED ORDER — DOXYCYCLINE HYCLATE 100 MG PO TABS: 100.0000 mg | ORAL_TABLET | Freq: Two times a day (BID) | ORAL | Status: DC

## 2012-05-01 NOTE — Discharge Summary (Signed)
I have reviewed this discharge summary and agree.    

## 2012-05-01 NOTE — Progress Notes (Signed)
Patient discharged to home. Patient AVS reviewed. Patient verbalized understanding of medications and follow-up appointments.  Patient remains stable; no signs or symptoms of distress.  Patient educated to return to the ER in cases of exacerbation of admitting symptoms, SOB, dizziness, fever, chest pain, or fainting.   

## 2012-05-04 ENCOUNTER — Ambulatory Visit (INDEPENDENT_AMBULATORY_CARE_PROVIDER_SITE_OTHER): Payer: Medicare Other | Admitting: Emergency Medicine

## 2012-05-04 ENCOUNTER — Encounter: Payer: Self-pay | Admitting: Emergency Medicine

## 2012-05-04 VITALS — BP 122/80 | HR 68 | Temp 97.9°F | Resp 16 | Ht 70.5 in | Wt 346.2 lb

## 2012-05-04 DIAGNOSIS — L03119 Cellulitis of unspecified part of limb: Secondary | ICD-10-CM | POA: Diagnosis not present

## 2012-05-04 DIAGNOSIS — N529 Male erectile dysfunction, unspecified: Secondary | ICD-10-CM | POA: Diagnosis not present

## 2012-05-04 DIAGNOSIS — L02419 Cutaneous abscess of limb, unspecified: Secondary | ICD-10-CM

## 2012-05-04 MED ORDER — SILDENAFIL CITRATE 100 MG PO TABS: ORAL_TABLET | ORAL | Status: DC

## 2012-05-04 NOTE — Progress Notes (Signed)
  Subjective:    Patient ID: Marc Mills, male    DOB: 09/10/1945, 66 y.o.   MRN: [id number]  HPI Pt is here for f/u of cellulitis right leg. Was hospitalized for this. For cellulitis of his right leg and treated with IV vancomycin. He is in today for followup. He was discharged on doxycycline. He is scheduled to have a PT/INR done at the Coumadin clinic .    Review of Systems     Objectively:   Physical ExamThere is no swelling of the right calf. It is decreased from when was admitted redness is significantly decreased. There still some mild warmth to the anterior surface of the right shin. There is significant underlying varicosities        Assessment & Plan:      Patient stable post recent hospitalization for cellulitis of the leg. He's to finish out his doxycycline and then I'll up with me if he continues to have any redness or swelling in his leg .

## 2012-05-05 ENCOUNTER — Other Ambulatory Visit: Payer: Self-pay | Admitting: Radiology

## 2012-05-05 LAB — CULTURE, BLOOD (ROUTINE X 2)
Culture: NO GROWTH
Culture: NO GROWTH

## 2012-05-05 NOTE — Telephone Encounter (Signed)
Patients coumadin was written by Dr [name], he has appt with coumadin clinic. Had gotten fax, sent fax to pharmacy, so next refill of coumadin can go through the coumadin clinic since they will be dosing.

## 2012-05-06 ENCOUNTER — Ambulatory Visit (INDEPENDENT_AMBULATORY_CARE_PROVIDER_SITE_OTHER): Payer: Medicare Other | Admitting: *Deleted

## 2012-05-06 DIAGNOSIS — I2699 Other pulmonary embolism without acute cor pulmonale: Secondary | ICD-10-CM | POA: Diagnosis not present

## 2012-05-06 LAB — POCT INR: INR: 3.8

## 2012-05-07 ENCOUNTER — Telehealth: Payer: Self-pay | Admitting: *Deleted

## 2012-05-07 NOTE — Telephone Encounter (Signed)
Per telephone note on 05/05/12 it sounds like coumadin clinic is managing this

## 2012-05-07 NOTE — Telephone Encounter (Signed)
Pharmacy requesting refill on warfarin 5mg . Last refilled on 04/01/12

## 2012-05-07 NOTE — Telephone Encounter (Signed)
Advised pharmacy to send to Dr Patty Sermons, coumadin clinic. They are now dosing this.

## 2012-05-09 ENCOUNTER — Other Ambulatory Visit: Payer: Self-pay | Admitting: *Deleted

## 2012-05-09 ENCOUNTER — Telehealth: Payer: Self-pay

## 2012-05-09 MED ORDER — DOXYCYCLINE HYCLATE 100 MG PO TABS: 100.0000 mg | ORAL_TABLET | Freq: Two times a day (BID) | ORAL | Status: AC

## 2012-05-09 NOTE — Telephone Encounter (Signed)
PT STATES THAT HE IS CALLING IN TO HAVE HIS REFILL OF DOXICYLCINE FROM DR [name]  BEST NUMBER [phone]

## 2012-05-26 DIAGNOSIS — M1710 Unilateral primary osteoarthritis, unspecified knee: Secondary | ICD-10-CM | POA: Diagnosis not present

## 2012-05-27 ENCOUNTER — Ambulatory Visit (INDEPENDENT_AMBULATORY_CARE_PROVIDER_SITE_OTHER): Payer: Medicare Other | Admitting: *Deleted

## 2012-05-27 DIAGNOSIS — Z792 Long term (current) use of antibiotics: Secondary | ICD-10-CM | POA: Diagnosis not present

## 2012-05-27 DIAGNOSIS — I2699 Other pulmonary embolism without acute cor pulmonale: Secondary | ICD-10-CM | POA: Diagnosis not present

## 2012-05-27 DIAGNOSIS — Z86718 Personal history of other venous thrombosis and embolism: Secondary | ICD-10-CM | POA: Diagnosis not present

## 2012-05-27 LAB — POCT INR: INR: 3

## 2012-06-11 ENCOUNTER — Other Ambulatory Visit: Payer: Medicare Other | Admitting: Lab

## 2012-06-24 ENCOUNTER — Ambulatory Visit (INDEPENDENT_AMBULATORY_CARE_PROVIDER_SITE_OTHER): Payer: Medicare Other | Admitting: Pharmacist

## 2012-06-24 DIAGNOSIS — Z86718 Personal history of other venous thrombosis and embolism: Secondary | ICD-10-CM

## 2012-06-24 DIAGNOSIS — Z792 Long term (current) use of antibiotics: Secondary | ICD-10-CM

## 2012-06-24 LAB — POCT INR: INR: 3.5

## 2012-06-25 ENCOUNTER — Other Ambulatory Visit (HOSPITAL_BASED_OUTPATIENT_CLINIC_OR_DEPARTMENT_OTHER): Payer: Medicare Other | Admitting: Lab

## 2012-06-25 DIAGNOSIS — Z87898 Personal history of other specified conditions: Secondary | ICD-10-CM

## 2012-06-25 DIAGNOSIS — Z86711 Personal history of pulmonary embolism: Secondary | ICD-10-CM | POA: Diagnosis not present

## 2012-06-25 DIAGNOSIS — C8587 Other specified types of non-Hodgkin lymphoma, spleen: Secondary | ICD-10-CM

## 2012-06-25 LAB — CBC WITH DIFFERENTIAL/PLATELET
BASO%: 0.9 % (ref 0.0–2.0)
Basophils Absolute: 0.1 10*3/uL (ref 0.0–0.1)
EOS%: 8.6 % — ABNORMAL HIGH (ref 0.0–7.0)
Eosinophils Absolute: 0.6 10*3/uL — ABNORMAL HIGH (ref 0.0–0.5)
HCT: 41.3 % (ref 38.4–49.9)
HGB: 13.9 g/dL (ref 13.0–17.1)
LYMPH%: 14.9 % (ref 14.0–49.0)
MCH: 30 pg (ref 27.2–33.4)
MCHC: 33.6 g/dL (ref 32.0–36.0)
MCV: 89.1 fL (ref 79.3–98.0)
MONO#: 0.9 10*3/uL (ref 0.1–0.9)
MONO%: 13.6 % (ref 0.0–14.0)
NEUT#: 4.3 10*3/uL (ref 1.5–6.5)
NEUT%: 62 % (ref 39.0–75.0)
Platelets: 356 10*3/uL (ref 140–400)
RBC: 4.63 10*6/uL (ref 4.20–5.82)
RDW: 14.2 % (ref 11.0–14.6)
WBC: 7 10*3/uL (ref 4.0–10.3)
lymph#: 1 10*3/uL (ref 0.9–3.3)

## 2012-06-25 LAB — COMPREHENSIVE METABOLIC PANEL (CC13)
ALT: 20 U/L (ref 0–55)
AST: 15 U/L (ref 5–34)
Albumin: 3.3 g/dL — ABNORMAL LOW (ref 3.5–5.0)
Alkaline Phosphatase: 60 U/L (ref 40–150)
BUN: 14 mg/dL (ref 7.0–26.0)
CO2: 26 mEq/L (ref 22–29)
Calcium: 9.1 mg/dL (ref 8.4–10.4)
Chloride: 105 mEq/L (ref 98–107)
Creatinine: 0.7 mg/dL (ref 0.7–1.3)
Glucose: 108 mg/dl — ABNORMAL HIGH (ref 70–99)
Potassium: 4.6 mEq/L (ref 3.5–5.1)
Sodium: 138 mEq/L (ref 136–145)
Total Bilirubin: 0.31 mg/dL (ref 0.20–1.20)
Total Protein: 6.6 g/dL (ref 6.4–8.3)

## 2012-06-25 LAB — URIC ACID (CC13): Uric Acid, Serum: 5.2 mg/dl (ref 2.6–7.4)

## 2012-06-25 LAB — LACTATE DEHYDROGENASE (CC13): LDH: 117 U/L — ABNORMAL LOW (ref 125–245)

## 2012-06-28 LAB — BETA 2 MICROGLOBULIN, SERUM: Beta-2 Microglobulin: 1.5 mg/L (ref 1.01–1.73)

## 2012-06-29 DIAGNOSIS — M1710 Unilateral primary osteoarthritis, unspecified knee: Secondary | ICD-10-CM | POA: Diagnosis not present

## 2012-07-22 ENCOUNTER — Ambulatory Visit (INDEPENDENT_AMBULATORY_CARE_PROVIDER_SITE_OTHER): Payer: Medicare Other

## 2012-07-22 DIAGNOSIS — Z86718 Personal history of other venous thrombosis and embolism: Secondary | ICD-10-CM

## 2012-07-22 DIAGNOSIS — Z792 Long term (current) use of antibiotics: Secondary | ICD-10-CM

## 2012-07-22 LAB — POCT INR: INR: 3.1

## 2012-07-27 ENCOUNTER — Telehealth: Payer: Self-pay | Admitting: *Deleted

## 2012-07-27 NOTE — Telephone Encounter (Signed)
Lm made pt aware of her appt change. gv new appt d/t and also informed her that i will send a letter/cal as well.

## 2012-08-01 ENCOUNTER — Other Ambulatory Visit: Payer: Self-pay | Admitting: Oncology

## 2012-08-17 ENCOUNTER — Encounter: Payer: Self-pay | Admitting: Family Medicine

## 2012-08-19 ENCOUNTER — Ambulatory Visit (INDEPENDENT_AMBULATORY_CARE_PROVIDER_SITE_OTHER): Payer: Medicare Other | Admitting: *Deleted

## 2012-08-19 DIAGNOSIS — Z792 Long term (current) use of antibiotics: Secondary | ICD-10-CM

## 2012-08-19 DIAGNOSIS — Z86718 Personal history of other venous thrombosis and embolism: Secondary | ICD-10-CM

## 2012-08-19 LAB — POCT INR: INR: 3.2

## 2012-09-02 ENCOUNTER — Ambulatory Visit (INDEPENDENT_AMBULATORY_CARE_PROVIDER_SITE_OTHER): Payer: Medicare Other | Admitting: *Deleted

## 2012-09-02 DIAGNOSIS — Z86718 Personal history of other venous thrombosis and embolism: Secondary | ICD-10-CM | POA: Diagnosis not present

## 2012-09-02 DIAGNOSIS — Z792 Long term (current) use of antibiotics: Secondary | ICD-10-CM

## 2012-09-02 LAB — POCT INR: INR: 3.6

## 2012-09-03 ENCOUNTER — Other Ambulatory Visit (HOSPITAL_BASED_OUTPATIENT_CLINIC_OR_DEPARTMENT_OTHER): Payer: Medicare Other

## 2012-09-03 ENCOUNTER — Telehealth: Payer: Self-pay | Admitting: Internal Medicine

## 2012-09-03 ENCOUNTER — Telehealth: Payer: Self-pay

## 2012-09-03 DIAGNOSIS — C8587 Other specified types of non-Hodgkin lymphoma, spleen: Secondary | ICD-10-CM | POA: Diagnosis not present

## 2012-09-03 DIAGNOSIS — Z87898 Personal history of other specified conditions: Secondary | ICD-10-CM

## 2012-09-03 LAB — COMPREHENSIVE METABOLIC PANEL (CC13)
ALT: 19 U/L (ref 0–55)
AST: 14 U/L (ref 5–34)
Albumin: 3.4 g/dL — ABNORMAL LOW (ref 3.5–5.0)
Alkaline Phosphatase: 60 U/L (ref 40–150)
BUN: 15.6 mg/dL (ref 7.0–26.0)
CO2: 26 mEq/L (ref 22–29)
Calcium: 9 mg/dL (ref 8.4–10.4)
Chloride: 105 mEq/L (ref 98–107)
Creatinine: 0.7 mg/dL (ref 0.7–1.3)
Glucose: 105 mg/dl — ABNORMAL HIGH (ref 70–99)
Potassium: 4.4 mEq/L (ref 3.5–5.1)
Sodium: 140 mEq/L (ref 136–145)
Total Bilirubin: 0.44 mg/dL (ref 0.20–1.20)
Total Protein: 6.5 g/dL (ref 6.4–8.3)

## 2012-09-03 LAB — CBC WITH DIFFERENTIAL/PLATELET
BASO%: 1.3 % (ref 0.0–2.0)
Basophils Absolute: 0.1 10*3/uL (ref 0.0–0.1)
EOS%: 8.2 % — ABNORMAL HIGH (ref 0.0–7.0)
Eosinophils Absolute: 0.5 10*3/uL (ref 0.0–0.5)
HCT: 41.8 % (ref 38.4–49.9)
HGB: 14 g/dL (ref 13.0–17.1)
LYMPH%: 17.3 % (ref 14.0–49.0)
MCH: 29.9 pg (ref 27.2–33.4)
MCHC: 33.5 g/dL (ref 32.0–36.0)
MCV: 89.3 fL (ref 79.3–98.0)
MONO#: 1 10*3/uL — ABNORMAL HIGH (ref 0.1–0.9)
MONO%: 14.5 % — ABNORMAL HIGH (ref 0.0–14.0)
NEUT#: 3.9 10*3/uL (ref 1.5–6.5)
NEUT%: 58.7 % (ref 39.0–75.0)
Platelets: 309 10*3/uL (ref 140–400)
RBC: 4.68 10*6/uL (ref 4.20–5.82)
RDW: 14.5 % (ref 11.0–14.6)
WBC: 6.6 10*3/uL (ref 4.0–10.3)
lymph#: 1.1 10*3/uL (ref 0.9–3.3)

## 2012-09-03 LAB — URIC ACID (CC13): Uric Acid, Serum: 6.1 mg/dl (ref 2.6–7.4)

## 2012-09-03 LAB — LACTATE DEHYDROGENASE (CC13): LDH: 112 U/L — ABNORMAL LOW (ref 125–245)

## 2012-09-03 NOTE — Telephone Encounter (Signed)
Paper and message on CY's cart for review and signature.

## 2012-09-03 NOTE — Telephone Encounter (Signed)
I can fill out the form medically for surgery but he will still need clearance and recommendations from the Coumadin clinic about how to manage his anticoagulation.

## 2012-09-03 NOTE — Telephone Encounter (Signed)
I have put this in Dr Ellis Parents box for review.

## 2012-09-03 NOTE — Telephone Encounter (Signed)
PT DROPPED OFF FORM FOR DR DAUB TO FILL OUT FOR SURGICAL CLEARANCE. STATES HE DOESN'T NEED TO SEE DAUB TO FILL IT OUT.  BEST: [phone]   FORM IS IN B.BRIGGS BOX  BF

## 2012-09-03 NOTE — Telephone Encounter (Signed)
Faxed completed form to [facility] ortho and made copy to scan. Also left original for pt p/up. Notified pt on VM done and copy is here for him if he would like it.

## 2012-09-06 LAB — BETA 2 MICROGLOBULIN, SERUM: Beta-2 Microglobulin: 1.75 mg/L — ABNORMAL HIGH (ref 1.01–1.73)

## 2012-09-07 NOTE — Telephone Encounter (Signed)
Paper has been faxed back.

## 2012-09-07 NOTE — Telephone Encounter (Signed)
ATC patient, no answer LM on named voicemail stating the below. Nothing further

## 2012-09-11 ENCOUNTER — Other Ambulatory Visit: Payer: Self-pay | Admitting: Oncology

## 2012-09-11 DIAGNOSIS — C8590 Non-Hodgkin lymphoma, unspecified, unspecified site: Secondary | ICD-10-CM

## 2012-09-13 ENCOUNTER — Telehealth: Payer: Self-pay | Admitting: *Deleted

## 2012-09-13 NOTE — Telephone Encounter (Signed)
sw pt gv appt for a lab scheduled for 09/16/12 @ 8am...td

## 2012-09-15 ENCOUNTER — Telehealth: Payer: Self-pay | Admitting: *Deleted

## 2012-09-15 NOTE — Telephone Encounter (Signed)
Lm informing the pt that i had received his call about his appt. gv appt d/t for 09/16/12 @ 9:30am to have a lab. Asked pt to please give me a call back if this was not an good appt time...td

## 2012-09-16 ENCOUNTER — Other Ambulatory Visit: Payer: Medicare Other | Admitting: Lab

## 2012-09-16 ENCOUNTER — Other Ambulatory Visit (HOSPITAL_BASED_OUTPATIENT_CLINIC_OR_DEPARTMENT_OTHER): Payer: Medicare Other | Admitting: Lab

## 2012-09-16 ENCOUNTER — Ambulatory Visit (INDEPENDENT_AMBULATORY_CARE_PROVIDER_SITE_OTHER): Payer: Medicare Other

## 2012-09-16 DIAGNOSIS — Z86718 Personal history of other venous thrombosis and embolism: Secondary | ICD-10-CM

## 2012-09-16 DIAGNOSIS — Z792 Long term (current) use of antibiotics: Secondary | ICD-10-CM | POA: Diagnosis not present

## 2012-09-16 DIAGNOSIS — C8587 Other specified types of non-Hodgkin lymphoma, spleen: Secondary | ICD-10-CM

## 2012-09-16 DIAGNOSIS — C8590 Non-Hodgkin lymphoma, unspecified, unspecified site: Secondary | ICD-10-CM

## 2012-09-16 DIAGNOSIS — Z87898 Personal history of other specified conditions: Secondary | ICD-10-CM | POA: Diagnosis not present

## 2012-09-16 LAB — CBC WITH DIFFERENTIAL/PLATELET
BASO%: 0.9 % (ref 0.0–2.0)
Basophils Absolute: 0.1 10*3/uL (ref 0.0–0.1)
EOS%: 8 % — ABNORMAL HIGH (ref 0.0–7.0)
Eosinophils Absolute: 0.6 10*3/uL — ABNORMAL HIGH (ref 0.0–0.5)
HCT: 41.1 % (ref 38.4–49.9)
HGB: 14 g/dL (ref 13.0–17.1)
LYMPH%: 14.1 % (ref 14.0–49.0)
MCH: 30.3 pg (ref 27.2–33.4)
MCHC: 34.1 g/dL (ref 32.0–36.0)
MCV: 88.8 fL (ref 79.3–98.0)
MONO#: 1 10*3/uL — ABNORMAL HIGH (ref 0.1–0.9)
MONO%: 14.5 % — ABNORMAL HIGH (ref 0.0–14.0)
NEUT#: 4.4 10*3/uL (ref 1.5–6.5)
NEUT%: 62.5 % (ref 39.0–75.0)
Platelets: 311 10*3/uL (ref 140–400)
RBC: 4.63 10*6/uL (ref 4.20–5.82)
RDW: 14 % (ref 11.0–14.6)
WBC: 7 10*3/uL (ref 4.0–10.3)
lymph#: 1 10*3/uL (ref 0.9–3.3)

## 2012-09-16 LAB — COMPREHENSIVE METABOLIC PANEL (CC13)
ALT: 19 U/L (ref 0–55)
AST: 15 U/L (ref 5–34)
Albumin: 3.5 g/dL (ref 3.5–5.0)
Alkaline Phosphatase: 62 U/L (ref 40–150)
BUN: 15.4 mg/dL (ref 7.0–26.0)
CO2: 26 mEq/L (ref 22–29)
Calcium: 9.2 mg/dL (ref 8.4–10.4)
Chloride: 104 mEq/L (ref 98–107)
Creatinine: 0.7 mg/dL (ref 0.7–1.3)
Glucose: 96 mg/dl (ref 70–99)
Potassium: 4.6 mEq/L (ref 3.5–5.1)
Sodium: 139 mEq/L (ref 136–145)
Total Bilirubin: 0.36 mg/dL (ref 0.20–1.20)
Total Protein: 6.5 g/dL (ref 6.4–8.3)

## 2012-09-16 LAB — URIC ACID (CC13): Uric Acid, Serum: 5.4 mg/dl (ref 2.6–7.4)

## 2012-09-16 LAB — LACTATE DEHYDROGENASE (CC13): LDH: 116 U/L — ABNORMAL LOW (ref 125–245)

## 2012-09-16 LAB — POCT INR: INR: 2.2

## 2012-09-17 ENCOUNTER — Encounter: Payer: Self-pay | Admitting: Oncology

## 2012-09-17 ENCOUNTER — Other Ambulatory Visit: Payer: Self-pay | Admitting: Oncology

## 2012-09-17 LAB — BETA 2 MICROGLOBULIN, SERUM: Beta-2 Microglobulin: 1.59 mg/L (ref 1.01–1.73)

## 2012-09-20 ENCOUNTER — Other Ambulatory Visit: Payer: Self-pay | Admitting: Oncology

## 2012-10-04 ENCOUNTER — Other Ambulatory Visit: Payer: Self-pay | Admitting: Emergency Medicine

## 2012-10-04 DIAGNOSIS — M1710 Unilateral primary osteoarthritis, unspecified knee: Secondary | ICD-10-CM | POA: Diagnosis not present

## 2012-10-04 DIAGNOSIS — I2699 Other pulmonary embolism without acute cor pulmonale: Secondary | ICD-10-CM

## 2012-10-07 ENCOUNTER — Telehealth: Payer: Self-pay | Admitting: *Deleted

## 2012-10-11 ENCOUNTER — Telehealth: Payer: Self-pay | Admitting: *Deleted

## 2012-10-11 NOTE — Telephone Encounter (Signed)
Called patient and spouse answered the phone. Marc Mills is in [place] and I advised his wife appointment is with Dr Jacinto Halim on 10/25/2012 at 11:45 a.m.; per patient that time is OK.

## 2012-10-18 ENCOUNTER — Ambulatory Visit (INDEPENDENT_AMBULATORY_CARE_PROVIDER_SITE_OTHER): Payer: Medicare Other | Admitting: *Deleted

## 2012-10-18 ENCOUNTER — Encounter: Payer: Self-pay | Admitting: Internal Medicine

## 2012-10-18 ENCOUNTER — Ambulatory Visit (INDEPENDENT_AMBULATORY_CARE_PROVIDER_SITE_OTHER): Payer: Medicare Other | Admitting: Internal Medicine

## 2012-10-18 VITALS — BP 112/62 | HR 61 | Ht 72.0 in | Wt 343.6 lb

## 2012-10-18 DIAGNOSIS — G4733 Obstructive sleep apnea (adult) (pediatric): Secondary | ICD-10-CM | POA: Diagnosis not present

## 2012-10-18 DIAGNOSIS — Z86718 Personal history of other venous thrombosis and embolism: Secondary | ICD-10-CM

## 2012-10-18 DIAGNOSIS — I8390 Asymptomatic varicose veins of unspecified lower extremity: Secondary | ICD-10-CM | POA: Diagnosis not present

## 2012-10-18 DIAGNOSIS — Z9081 Acquired absence of spleen: Secondary | ICD-10-CM

## 2012-10-18 DIAGNOSIS — Z9089 Acquired absence of other organs: Secondary | ICD-10-CM

## 2012-10-18 DIAGNOSIS — Z86711 Personal history of pulmonary embolism: Secondary | ICD-10-CM

## 2012-10-18 DIAGNOSIS — J449 Chronic obstructive pulmonary disease, unspecified: Secondary | ICD-10-CM

## 2012-10-18 DIAGNOSIS — Z792 Long term (current) use of antibiotics: Secondary | ICD-10-CM

## 2012-10-18 DIAGNOSIS — I8393 Asymptomatic varicose veins of bilateral lower extremities: Secondary | ICD-10-CM

## 2012-10-18 LAB — POCT INR: INR: 3

## 2012-10-18 NOTE — Assessment & Plan Note (Signed)
Leg veins is still easily seen. Legs are very heavy.

## 2012-10-18 NOTE — Patient Instructions (Addendum)
Order- Office spirometry- dx hx pulmonary embolism  I do not anticipate respiratory problems with your planned surgery.  Dr Cleophas Dunker and the Pharmacists will manage your anticoagulation. Since you prefer to return to coumadin, it can be followed after surgery at the coumadin clinic as before.  Please call as needed

## 2012-10-18 NOTE — Assessment & Plan Note (Addendum)
Office spirometry 10/18/12- mild obstructive airways disease, insignificant response to bronchodilator . FVC 4.13/84%, FEV1 2.96/78%, FEV1/FVC 0.72/ 93% , FEF25-75% 2.18/ 65%  40 pack year smoking hx, ending 1986, so this is mild COPD. Very mild obstructive airways disease, stable and with insignificant response to bronchodilator. After 40 pack years of smoking and pulmonary embolism x2, we are glad to scores are this good. We can get formal PFT 4 measured lung volumes and diffusion capacity later if needed. I do not anticipate pulmonary limitation to planned total knee replacement.

## 2012-10-18 NOTE — Assessment & Plan Note (Addendum)
Good CPAP7/ [employer] compliance and control. He feels very comfortable with this pressure. His wife does not report snoring.
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
## 2012-10-18 NOTE — Telephone Encounter (Signed)
Telephone call initiated by error.

## 2012-10-18 NOTE — Assessment & Plan Note (Signed)
Annotation: 2000 and 2002 He has remained on chronic Coumadin, managed at the [facility] Coumadin clinic. He wishes to return to Coumadin after his surgery. That can be managed by Pharmacy consultation for Coumadin management during hospitalization, anticipating probable Lovenox bridge to resume Coumadin, or simple resumption of his outpatient Coumadin dose, depending on how long coagulation needs to be modified after surgery.

## 2012-10-21 ENCOUNTER — Other Ambulatory Visit: Payer: Self-pay | Admitting: Emergency Medicine

## 2012-10-21 DIAGNOSIS — F32A Depression, unspecified: Secondary | ICD-10-CM

## 2012-10-21 DIAGNOSIS — F329 Major depressive disorder, single episode, unspecified: Secondary | ICD-10-CM

## 2012-10-21 MED ORDER — CITALOPRAM HYDROBROMIDE 20 MG PO TABS: 20.0000 mg | ORAL_TABLET | Freq: Every day | ORAL | Status: DC

## 2012-10-25 DIAGNOSIS — G4733 Obstructive sleep apnea (adult) (pediatric): Secondary | ICD-10-CM | POA: Diagnosis not present

## 2012-10-25 DIAGNOSIS — Z86711 Personal history of pulmonary embolism: Secondary | ICD-10-CM | POA: Diagnosis not present

## 2012-10-25 DIAGNOSIS — Z01810 Encounter for preprocedural cardiovascular examination: Secondary | ICD-10-CM | POA: Diagnosis not present

## 2012-10-26 ENCOUNTER — Encounter (HOSPITAL_COMMUNITY): Payer: Self-pay

## 2012-10-27 ENCOUNTER — Encounter (HOSPITAL_COMMUNITY): Payer: Self-pay

## 2012-10-27 ENCOUNTER — Encounter (HOSPITAL_COMMUNITY)
Admission: RE | Admit: 2012-10-27 | Discharge: 2012-10-27 | Disposition: A | Discharge status: Home / Self Care | Payer: Medicare Other | Source: Ambulatory Visit | Attending: Orthopedic Surgery | Admitting: Orthopedic Surgery

## 2012-10-27 ENCOUNTER — Encounter (HOSPITAL_COMMUNITY)
Admission: RE | Admit: 2012-10-27 | Discharge: 2012-10-27 | Disposition: A | Discharge status: Home / Self Care | Payer: Medicare Other | Source: Ambulatory Visit | Attending: Orthopaedic Surgery | Admitting: Orthopaedic Surgery

## 2012-10-27 DIAGNOSIS — Z79899 Other long term (current) drug therapy: Secondary | ICD-10-CM | POA: Diagnosis not present

## 2012-10-27 DIAGNOSIS — H25049 Posterior subcapsular polar age-related cataract, unspecified eye: Secondary | ICD-10-CM | POA: Diagnosis not present

## 2012-10-27 DIAGNOSIS — Z86718 Personal history of other venous thrombosis and embolism: Secondary | ICD-10-CM | POA: Diagnosis not present

## 2012-10-27 DIAGNOSIS — M109 Gout, unspecified: Secondary | ICD-10-CM | POA: Diagnosis not present

## 2012-10-27 DIAGNOSIS — Z6841 Body Mass Index (BMI) 40.0 and over, adult: Secondary | ICD-10-CM | POA: Diagnosis not present

## 2012-10-27 DIAGNOSIS — M1710 Unilateral primary osteoarthritis, unspecified knee: Secondary | ICD-10-CM | POA: Diagnosis not present

## 2012-10-27 DIAGNOSIS — Z01818 Encounter for other preprocedural examination: Secondary | ICD-10-CM | POA: Diagnosis not present

## 2012-10-27 DIAGNOSIS — G8918 Other acute postprocedural pain: Secondary | ICD-10-CM | POA: Diagnosis not present

## 2012-10-27 DIAGNOSIS — Z87891 Personal history of nicotine dependence: Secondary | ICD-10-CM | POA: Diagnosis not present

## 2012-10-27 DIAGNOSIS — H2510 Age-related nuclear cataract, unspecified eye: Secondary | ICD-10-CM | POA: Diagnosis not present

## 2012-10-27 DIAGNOSIS — G4733 Obstructive sleep apnea (adult) (pediatric): Secondary | ICD-10-CM | POA: Diagnosis not present

## 2012-10-27 DIAGNOSIS — Z01810 Encounter for preprocedural cardiovascular examination: Secondary | ICD-10-CM | POA: Diagnosis not present

## 2012-10-27 DIAGNOSIS — M25569 Pain in unspecified knee: Secondary | ICD-10-CM | POA: Diagnosis not present

## 2012-10-27 DIAGNOSIS — Z87898 Personal history of other specified conditions: Secondary | ICD-10-CM | POA: Diagnosis not present

## 2012-10-27 DIAGNOSIS — Z961 Presence of intraocular lens: Secondary | ICD-10-CM | POA: Diagnosis not present

## 2012-10-27 DIAGNOSIS — Z01812 Encounter for preprocedural laboratory examination: Secondary | ICD-10-CM | POA: Diagnosis not present

## 2012-10-27 DIAGNOSIS — Z86711 Personal history of pulmonary embolism: Secondary | ICD-10-CM | POA: Diagnosis not present

## 2012-10-27 DIAGNOSIS — H33059 Total retinal detachment, unspecified eye: Secondary | ICD-10-CM | POA: Diagnosis not present

## 2012-10-27 DIAGNOSIS — Z7901 Long term (current) use of anticoagulants: Secondary | ICD-10-CM | POA: Diagnosis not present

## 2012-10-27 LAB — CBC
HCT: 41.5 % (ref 39.0–52.0)
Hemoglobin: 14.2 g/dL (ref 13.0–17.0)
MCH: 30.1 pg (ref 26.0–34.0)
MCHC: 34.2 g/dL (ref 30.0–36.0)
MCV: 88.1 fL (ref 78.0–100.0)
Platelets: 357 10*3/uL (ref 150–400)
RBC: 4.71 MIL/uL (ref 4.22–5.81)
RDW: 15.2 % (ref 11.5–15.5)
WBC: 6.8 10*3/uL (ref 4.0–10.5)

## 2012-10-27 LAB — URINALYSIS, ROUTINE W REFLEX MICROSCOPIC
Bilirubin Urine: NEGATIVE
Glucose, UA: NEGATIVE mg/dL
Hgb urine dipstick: NEGATIVE
Ketones, ur: NEGATIVE mg/dL
Leukocytes, UA: NEGATIVE
Nitrite: NEGATIVE
Protein, ur: NEGATIVE mg/dL
Specific Gravity, Urine: 1.011 (ref 1.005–1.030)
Urobilinogen, UA: 0.2 mg/dL (ref 0.0–1.0)
pH: 7 (ref 5.0–8.0)

## 2012-10-27 LAB — SURGICAL PCR SCREEN
MRSA, PCR: NEGATIVE
Staphylococcus aureus: NEGATIVE

## 2012-10-27 LAB — COMPREHENSIVE METABOLIC PANEL
ALT: 18 U/L (ref 0–53)
AST: 18 U/L (ref 0–37)
Albumin: 3.5 g/dL (ref 3.5–5.2)
Alkaline Phosphatase: 62 U/L (ref 39–117)
BUN: 13 mg/dL (ref 6–23)
CO2: 31 mEq/L (ref 19–32)
Calcium: 9.1 mg/dL (ref 8.4–10.5)
Chloride: 101 mEq/L (ref 96–112)
Creatinine, Ser: 0.61 mg/dL (ref 0.50–1.35)
GFR calc Af Amer: >90 mL/min (ref >90)
GFR calc non Af Amer: >90 mL/min (ref >90)
Glucose, Bld: 86 mg/dL (ref 70–99)
Potassium: 4.3 mEq/L (ref 3.5–5.1)
Sodium: 139 mEq/L (ref 135–145)
Total Bilirubin: 0.3 mg/dL (ref 0.3–1.2)
Total Protein: 6.8 g/dL (ref 6.0–8.3)

## 2012-10-27 LAB — TYPE AND SCREEN
ABO/RH(D): B POS
Antibody Screen: NEGATIVE

## 2012-10-27 LAB — PROTIME-INR
INR: 2.21 — ABNORMAL HIGH (ref 0.00–1.49)
Prothrombin Time: 23.8 seconds — ABNORMAL HIGH (ref 11.6–15.2)

## 2012-10-27 LAB — APTT: aPTT: 52 seconds — ABNORMAL HIGH (ref 24–37)

## 2012-10-27 NOTE — Pre-Procedure Instructions (Addendum)
Marc Mills  10/27/2012   Your procedure is scheduled on:  11/02/12  Report to [facility] at 815 AM.  Call this number if you have problems the morning of surgery: [phone]   Remember:   Do not eat food or drink liquids after midnight.   Take these medicines the morning of surgery with A SIP OF WATER: celexa                 STOP glucosamine- chondroitin, coumadin per dr   Drucilla Schmidt not wear jewelry, make-up or nail polish.  Do not wear lotions, powders, or perfumes. You may wear deodorant.  Do not shave 48 hours prior to surgery. Men may shave face and neck.  Do not bring valuables to the hospital.  [facility] is not responsible                   for any belongings or valuables.  Contacts, dentures or bridgework may not be worn into surgery.  Leave suitcase in the car. After surgery it may be brought to your room.  For patients admitted to the hospital, checkout time is 11:00 AM the day of  discharge.   Patients discharged the day of surgery will not be allowed to drive  home.  Name and phone number of your driver:   Special Instructions: Incentive Spirometry - Practice and bring it with you on the day of surgery. Shower using CHG 2 nights before surgery and the night before surgery.  If you shower the day of surgery use CHG.  Use special wash - you have one bottle of CHG for all showers.  You should use approximately 1/3 of the bottle for each shower.   Please read over the following fact sheets that you were given: Pain Booklet, Coughing and Deep Breathing, Blood Transfusion Information, MRSA Information and Surgical Site Infection Prevention

## 2012-10-27 NOTE — Progress Notes (Signed)
, ,   ekg req'd from dr Jacinto Halim done 10/15/12?   ,to see brian pa at dr whitfield office today about stopping coumadin prior to surgery
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
## 2012-10-29 DIAGNOSIS — Z01810 Encounter for preprocedural cardiovascular examination: Secondary | ICD-10-CM | POA: Diagnosis not present

## 2012-10-29 LAB — URINE CULTURE
Colony Count: NO GROWTH
Culture: NO GROWTH

## 2012-10-29 NOTE — Progress Notes (Signed)
This is a 67 year old male who is scheduled for left total knee replacement surgery to be performed by Dr. Norlene Campbell on Tuesday 02 November 2012.  This patient was referred to dr. Delrae Rend with [facility] for cardac clearance for surgery. This patient was seen on Monday 25 October 2012. A Lexiscan Myoview Stress Test was requested and was performed on Friday morning 29 October 2012.   I spoke with Jacqualine Code, PA-C on Friday afternoon regarding this patient. As of our conversation, the stress test results/cardiac clearance had not been received yet.  The patient's PCP (Dr. Waylan Boga) has provided surgery clearance from a medical standpoint. Medical clearnace for surgery has also been provided by dr. Fannie Knee.  This patient's past medical history is significant for OSA/CPAP, obesity (BMI > 42/Weight 339 pounds), cancer/lymphoma, DVT/PE requiring chronic anti-coagulation with coumadin. He will discontinue coumadin prior to surgery. As such, he should have his coags checked on DOS.  As of the close of business on Friday, official cardiac clearance is still pending.   Kelton Pillar. Kayleigh Broadwell, PA-C

## 2012-11-01 MED ORDER — SODIUM CHLORIDE 0.9 % IV SOLN: INTRAVENOUS | Status: DC

## 2012-11-01 MED ORDER — ACETAMINOPHEN 500 MG PO TABS: 1000.0000 mg | ORAL_TABLET | Freq: Once | ORAL | Status: DC

## 2012-11-01 MED ORDER — CHLORHEXIDINE GLUCONATE 4 % EX LIQD: 60.0000 mL | Freq: Once | CUTANEOUS | Status: DC

## 2012-11-01 MED ORDER — DEXTROSE 5 % IV SOLN
3.0000 g | INTRAVENOUS | Status: AC
  Administered 2012-11-02: 3 g via INTRAVENOUS
  Filled 2012-11-01: qty 3000

## 2012-11-01 MED ORDER — CHLORHEXIDINE GLUCONATE 4 % EX LIQD: 60.0000 mL | Freq: Every day | CUTANEOUS | Status: DC

## 2012-11-01 NOTE — Progress Notes (Signed)
Anesthesia follow-up:  Please see note from Grafton Folk, PA-C from 10/29/12.  I received a copy of patient's stress test from 10/29/12.  Results showed: Prominent and to take artifact in both rest and stress. A small sized apical ischemia cannot be completely excluded. Dynamic gated images revealed normal wall motion and endocardial thickening. Left ventricular EF was estimated to be 70%. Patient weighs 328 pounds and hence clinical correlation recommended. Overall, r. Ganji felt it was a low risk study and stated that "patient can be taken up for surgery with acceptable (low) CV morbidity and mortality."  Velna Ochs [facility] Stay [facility]/Anesthesiology Phone [phone] 11/01/2012 1:53 PM

## 2012-11-02 ENCOUNTER — Ambulatory Visit (HOSPITAL_COMMUNITY): Payer: Medicare Other | Admitting: Anesthesiology

## 2012-11-02 ENCOUNTER — Encounter (HOSPITAL_COMMUNITY): Payer: Self-pay | Admitting: Anesthesiology

## 2012-11-02 ENCOUNTER — Inpatient Hospital Stay (HOSPITAL_COMMUNITY)
Admission: RE | Admit: 2012-11-02 | Discharge: 2012-11-04 | DRG: 470 | Disposition: A | Discharge status: Home Health | Payer: Medicare Other | Source: Ambulatory Visit | Attending: Orthopaedic Surgery | Admitting: Orthopaedic Surgery

## 2012-11-02 ENCOUNTER — Encounter (HOSPITAL_COMMUNITY)
Admission: RE | Disposition: A | Discharge status: Home Health | Payer: Self-pay | Source: Ambulatory Visit | Attending: Orthopaedic Surgery

## 2012-11-02 DIAGNOSIS — Z87891 Personal history of nicotine dependence: Secondary | ICD-10-CM

## 2012-11-02 DIAGNOSIS — Z01812 Encounter for preprocedural laboratory examination: Secondary | ICD-10-CM

## 2012-11-02 DIAGNOSIS — M1712 Unilateral primary osteoarthritis, left knee: Secondary | ICD-10-CM | POA: Diagnosis present

## 2012-11-02 DIAGNOSIS — M1710 Unilateral primary osteoarthritis, unspecified knee: Principal | ICD-10-CM | POA: Diagnosis present

## 2012-11-02 DIAGNOSIS — G4733 Obstructive sleep apnea (adult) (pediatric): Secondary | ICD-10-CM | POA: Diagnosis present

## 2012-11-02 DIAGNOSIS — Z86711 Personal history of pulmonary embolism: Secondary | ICD-10-CM

## 2012-11-02 DIAGNOSIS — Z87898 Personal history of other specified conditions: Secondary | ICD-10-CM

## 2012-11-02 DIAGNOSIS — Z6841 Body Mass Index (BMI) 40.0 and over, adult: Secondary | ICD-10-CM

## 2012-11-02 DIAGNOSIS — Z86718 Personal history of other venous thrombosis and embolism: Secondary | ICD-10-CM

## 2012-11-02 DIAGNOSIS — Z79899 Other long term (current) drug therapy: Secondary | ICD-10-CM

## 2012-11-02 DIAGNOSIS — Z7901 Long term (current) use of anticoagulants: Secondary | ICD-10-CM

## 2012-11-02 DIAGNOSIS — M109 Gout, unspecified: Secondary | ICD-10-CM | POA: Diagnosis present

## 2012-11-02 HISTORY — PX: TOTAL KNEE ARTHROPLASTY: SHX125

## 2012-11-02 LAB — CBC
HCT: 42.9 % (ref 39.0–52.0)
Hemoglobin: 14.1 g/dL (ref 13.0–17.0)
MCH: 29.7 pg (ref 26.0–34.0)
MCHC: 32.9 g/dL (ref 30.0–36.0)
MCV: 90.3 fL (ref 78.0–100.0)
Platelets: 341 10*3/uL (ref 150–400)
RBC: 4.75 MIL/uL (ref 4.22–5.81)
RDW: 15.3 % (ref 11.5–15.5)
WBC: 16.1 10*3/uL — ABNORMAL HIGH (ref 4.0–10.5)

## 2012-11-02 LAB — APTT: aPTT: 35 seconds (ref 24–37)

## 2012-11-02 LAB — CREATININE, SERUM
Creatinine, Ser: 0.66 mg/dL (ref 0.50–1.35)
GFR calc Af Amer: >90 mL/min (ref >90)
GFR calc non Af Amer: >90 mL/min (ref >90)

## 2012-11-02 LAB — PROTIME-INR
INR: 1.03 (ref 0.00–1.49)
Prothrombin Time: 13.3 seconds (ref 11.6–15.2)

## 2012-11-02 SURGERY — ARTHROPLASTY, KNEE, TOTAL
Anesthesia: General | Site: Knee | Laterality: Left | Wound class: Clean

## 2012-11-02 MED ORDER — METHOCARBAMOL 100 MG/ML IJ SOLN
500.0000 mg | Freq: Four times a day (QID) | INTRAVENOUS | Status: DC | PRN
  Filled 2012-11-02: qty 5

## 2012-11-02 MED ORDER — METOCLOPRAMIDE HCL 5 MG/ML IJ SOLN: 5.0000 mg | Freq: Three times a day (TID) | INTRAMUSCULAR | Status: DC | PRN

## 2012-11-02 MED ORDER — PROPOFOL 10 MG/ML IV BOLUS
INTRAVENOUS | Status: DC | PRN
  Administered 2012-11-02: 200 mg via INTRAVENOUS
  Administered 2012-11-02 (×2): 50 mg via INTRAVENOUS
  Administered 2012-11-02: 100 mg via INTRAVENOUS

## 2012-11-02 MED ORDER — METOCLOPRAMIDE HCL 10 MG PO TABS: 5.0000 mg | ORAL_TABLET | Freq: Three times a day (TID) | ORAL | Status: DC | PRN

## 2012-11-02 MED ORDER — GLYCOPYRROLATE 0.2 MG/ML IJ SOLN
INTRAMUSCULAR | Status: DC | PRN
  Administered 2012-11-02: 0.2 mg via INTRAVENOUS
  Administered 2012-11-02: 0.4 mg via INTRAVENOUS

## 2012-11-02 MED ORDER — LIDOCAINE HCL (CARDIAC) 20 MG/ML IV SOLN
INTRAVENOUS | Status: DC | PRN
  Administered 2012-11-02: 60 mg via INTRAVENOUS

## 2012-11-02 MED ORDER — CITALOPRAM HYDROBROMIDE 20 MG PO TABS
20.0000 mg | ORAL_TABLET | Freq: Every day | ORAL | Status: DC
  Administered 2012-11-02 – 2012-11-04 (×3): 20 mg via ORAL
  Filled 2012-11-02 (×3): qty 1

## 2012-11-02 MED ORDER — DOCUSATE SODIUM 100 MG PO CAPS
100.0000 mg | ORAL_CAPSULE | Freq: Two times a day (BID) | ORAL | Status: DC
Start: 2012-11-02 — End: 2012-11-04
  Administered 2012-11-02 – 2012-11-04 (×4): 100 mg via ORAL
  Filled 2012-11-02 (×5): qty 1

## 2012-11-02 MED ORDER — HYDROMORPHONE HCL PF 1 MG/ML IJ SOLN
INTRAMUSCULAR | Status: AC
  Administered 2012-11-02: 0.5 mg via INTRAVENOUS
  Filled 2012-11-02: qty 1

## 2012-11-02 MED ORDER — OXYCODONE HCL 5 MG/5ML PO SOLN: 5.0000 mg | Freq: Once | ORAL | Status: DC | PRN

## 2012-11-02 MED ORDER — MENTHOL 3 MG MT LOZG: 1.0000 | LOZENGE | OROMUCOSAL | Status: DC | PRN

## 2012-11-02 MED ORDER — WARFARIN - PHARMACIST DOSING INPATIENT: Freq: Every day | Status: DC

## 2012-11-02 MED ORDER — HYDROMORPHONE HCL PF 1 MG/ML IJ SOLN
0.2500 mg | INTRAMUSCULAR | Status: DC | PRN
  Administered 2012-11-02 (×2): 0.5 mg via INTRAVENOUS

## 2012-11-02 MED ORDER — LACTATED RINGERS IV SOLN
INTRAVENOUS | Status: DC
  Administered 2012-11-02 (×3): via INTRAVENOUS

## 2012-11-02 MED ORDER — SODIUM CHLORIDE 0.9 % IV SOLN: 75.0000 mL/h | INTRAVENOUS | Status: DC

## 2012-11-02 MED ORDER — CEFAZOLIN SODIUM-DEXTROSE 2-3 GM-% IV SOLR
2.0000 g | Freq: Four times a day (QID) | INTRAVENOUS | Status: AC
  Administered 2012-11-02 (×2): 2 g via INTRAVENOUS
  Filled 2012-11-02 (×2): qty 50

## 2012-11-02 MED ORDER — THROMBIN 20000 UNITS EX KIT
PACK | CUTANEOUS | Status: AC
  Filled 2012-11-02: qty 1

## 2012-11-02 MED ORDER — ONDANSETRON HCL 4 MG/2ML IJ SOLN: 4.0000 mg | Freq: Four times a day (QID) | INTRAMUSCULAR | Status: DC | PRN

## 2012-11-02 MED ORDER — FENTANYL CITRATE 0.05 MG/ML IJ SOLN
INTRAMUSCULAR | Status: DC | PRN
  Administered 2012-11-02 (×3): 50 ug via INTRAVENOUS
  Administered 2012-11-02: 100 ug via INTRAVENOUS
  Administered 2012-11-02 (×5): 50 ug via INTRAVENOUS

## 2012-11-02 MED ORDER — WARFARIN SODIUM 5 MG PO TABS
5.0000 mg | ORAL_TABLET | ORAL | Status: AC
  Administered 2012-11-02: 5 mg via ORAL
  Filled 2012-11-02: qty 1

## 2012-11-02 MED ORDER — ENOXAPARIN SODIUM 30 MG/0.3ML ~~LOC~~ SOLN
30.0000 mg | Freq: Two times a day (BID) | SUBCUTANEOUS | Status: DC
  Administered 2012-11-03 – 2012-11-04 (×3): 30 mg via SUBCUTANEOUS
  Filled 2012-11-02 (×5): qty 0.3

## 2012-11-02 MED ORDER — MAGNESIUM HYDROXIDE 400 MG/5ML PO SUSP: 30.0000 mL | Freq: Every day | ORAL | Status: DC | PRN

## 2012-11-02 MED ORDER — ONDANSETRON HCL 4 MG PO TABS: 4.0000 mg | ORAL_TABLET | Freq: Four times a day (QID) | ORAL | Status: DC | PRN

## 2012-11-02 MED ORDER — ROCURONIUM BROMIDE 100 MG/10ML IV SOLN
INTRAVENOUS | Status: DC | PRN
  Administered 2012-11-02: 20 mg via INTRAVENOUS
  Administered 2012-11-02: 10 mg via INTRAVENOUS
  Administered 2012-11-02: 50 mg via INTRAVENOUS
  Administered 2012-11-02: 10 mg via INTRAVENOUS

## 2012-11-02 MED ORDER — WARFARIN SODIUM 2.5 MG PO TABS: 2.5000 mg | ORAL_TABLET | ORAL | Status: DC

## 2012-11-02 MED ORDER — METOCLOPRAMIDE HCL 5 MG/ML IJ SOLN
INTRAMUSCULAR | Status: DC | PRN
  Administered 2012-11-02: 10 mg via INTRAVENOUS

## 2012-11-02 MED ORDER — OXYCODONE HCL 5 MG PO TABS
5.0000 mg | ORAL_TABLET | Freq: Once | ORAL | Status: DC | PRN
Start: 2012-11-02 — End: 2012-11-02

## 2012-11-02 MED ORDER — ACETAMINOPHEN 650 MG RE SUPP: 650.0000 mg | Freq: Four times a day (QID) | RECTAL | Status: DC | PRN

## 2012-11-02 MED ORDER — KETOROLAC TROMETHAMINE 15 MG/ML IJ SOLN
15.0000 mg | Freq: Four times a day (QID) | INTRAMUSCULAR | Status: AC
  Administered 2012-11-02: 15 mg via INTRAVENOUS
  Filled 2012-11-02 (×2): qty 1

## 2012-11-02 MED ORDER — BUPIVACAINE-EPINEPHRINE PF 0.25-1:200000 % IJ SOLN
INTRAMUSCULAR | Status: AC
  Filled 2012-11-02: qty 30

## 2012-11-02 MED ORDER — BISACODYL 10 MG RE SUPP: 10.0000 mg | Freq: Every day | RECTAL | Status: DC | PRN

## 2012-11-02 MED ORDER — FLEET ENEMA 7-19 GM/118ML RE ENEM: 1.0000 | ENEMA | Freq: Once | RECTAL | Status: AC | PRN

## 2012-11-02 MED ORDER — HYDROMORPHONE HCL 2 MG PO TABS
2.0000 mg | ORAL_TABLET | ORAL | Status: DC | PRN
  Administered 2012-11-03: 2 mg via ORAL
  Administered 2012-11-03 – 2012-11-04 (×2): 4 mg via ORAL
  Administered 2012-11-04: 2 mg via ORAL
  Filled 2012-11-02: qty 1
  Filled 2012-11-02: qty 2
  Filled 2012-11-02: qty 1
  Filled 2012-11-02: qty 2

## 2012-11-02 MED ORDER — ONDANSETRON HCL 4 MG/2ML IJ SOLN
INTRAMUSCULAR | Status: DC | PRN
  Administered 2012-11-02: 4 mg via INTRAVENOUS

## 2012-11-02 MED ORDER — ACETAMINOPHEN 325 MG PO TABS: 650.0000 mg | ORAL_TABLET | Freq: Four times a day (QID) | ORAL | Status: DC | PRN

## 2012-11-02 MED ORDER — MIDAZOLAM HCL 2 MG/2ML IJ SOLN
INTRAMUSCULAR | Status: AC
  Administered 2012-11-02: 2 mg
  Filled 2012-11-02: qty 2

## 2012-11-02 MED ORDER — LIDOCAINE HCL 1 % IJ SOLN
INTRAMUSCULAR | Status: DC | PRN
  Administered 2012-11-02: 2 mL via INTRADERMAL

## 2012-11-02 MED ORDER — SODIUM CHLORIDE 0.9 % IR SOLN
Status: DC | PRN
  Administered 2012-11-02: 3000 mL

## 2012-11-02 MED ORDER — HYDROMORPHONE HCL PF 1 MG/ML IJ SOLN
1.0000 mg | INTRAMUSCULAR | Status: DC | PRN
  Administered 2012-11-02 – 2012-11-03 (×6): 1 mg via INTRAVENOUS
  Filled 2012-11-02 (×6): qty 1
  Filled 2012-11-02: qty 2

## 2012-11-02 MED ORDER — FENTANYL CITRATE 0.05 MG/ML IJ SOLN
INTRAMUSCULAR | Status: AC
  Administered 2012-11-02: 100 ug
  Filled 2012-11-02: qty 2

## 2012-11-02 MED ORDER — BUPIVACAINE-EPINEPHRINE 0.25% -1:200000 IJ SOLN
INTRAMUSCULAR | Status: DC | PRN
  Administered 2012-11-02: 20 mL
  Administered 2012-11-02: 10 mL

## 2012-11-02 MED ORDER — EPHEDRINE SULFATE 50 MG/ML IJ SOLN
INTRAMUSCULAR | Status: DC | PRN
  Administered 2012-11-02: 10 mg via INTRAVENOUS
  Administered 2012-11-02: 5 mg via INTRAVENOUS

## 2012-11-02 MED ORDER — METHOCARBAMOL 500 MG PO TABS
500.0000 mg | ORAL_TABLET | Freq: Four times a day (QID) | ORAL | Status: DC | PRN
  Administered 2012-11-03 – 2012-11-04 (×3): 500 mg via ORAL
  Filled 2012-11-02 (×3): qty 1

## 2012-11-02 MED ORDER — DEXAMETHASONE SODIUM PHOSPHATE 10 MG/ML IJ SOLN
INTRAMUSCULAR | Status: DC | PRN
  Administered 2012-11-02: 8 mg via INTRAVENOUS

## 2012-11-02 MED ORDER — THROMBIN 20000 UNITS EX KIT
PACK | CUTANEOUS | Status: DC | PRN
  Administered 2012-11-02: 20000 [IU] via TOPICAL

## 2012-11-02 MED ORDER — PHENOL 1.4 % MT LIQD: 1.0000 | OROMUCOSAL | Status: DC | PRN

## 2012-11-02 MED ORDER — NEOSTIGMINE METHYLSULFATE 1 MG/ML IJ SOLN
INTRAMUSCULAR | Status: DC | PRN
  Administered 2012-11-02: 3 mg via INTRAVENOUS

## 2012-11-02 MED ORDER — ALUM & MAG HYDROXIDE-SIMETH 200-200-20 MG/5ML PO SUSP: 30.0000 mL | ORAL | Status: DC | PRN

## 2012-11-02 MED ORDER — ROPIVACAINE HCL 5 MG/ML IJ SOLN
INTRAMUSCULAR | Status: DC | PRN
  Administered 2012-11-02: 25 mL

## 2012-11-02 MED ORDER — METOCLOPRAMIDE HCL 5 MG/ML IJ SOLN: 10.0000 mg | Freq: Once | INTRAMUSCULAR | Status: DC | PRN

## 2012-11-02 SURGICAL SUPPLY — 61 items
BANDAGE ESMARK 6X9 LF (GAUZE/BANDAGES/DRESSINGS) ×1 IMPLANT
BLADE SAGITTAL 25.0X1.19X90 (BLADE) ×2 IMPLANT
BNDG CMPR 9X6 STRL LF SNTH (GAUZE/BANDAGES/DRESSINGS) ×1
BNDG ESMARK 6X9 LF (GAUZE/BANDAGES/DRESSINGS) ×2
BOWL SMART MIX CTS (DISPOSABLE) ×2 IMPLANT
CAP UPCHARGE REVISION TRAY ×1 IMPLANT
CAPT RP KNEE ×1 IMPLANT
CEMENT HV SMART SET (Cement) ×4 IMPLANT
CLOTH BEACON ORANGE TIMEOUT ST (SAFETY) ×2 IMPLANT
COVER BACK TABLE 24X17X13 BIG (DRAPES) ×2 IMPLANT
COVER SURGICAL LIGHT HANDLE (MISCELLANEOUS) ×2 IMPLANT
CUFF TOURNIQUET SINGLE 34IN LL (TOURNIQUET CUFF) IMPLANT
CUFF TOURNIQUET SINGLE 44IN (TOURNIQUET CUFF) ×1 IMPLANT
DECANTER SPIKE VIAL GLASS SM (MISCELLANEOUS) ×1 IMPLANT
DRAPE EXTREMITY T 121X128X90 (DRAPE) ×2 IMPLANT
DRAPE PROXIMA HALF (DRAPES) ×2 IMPLANT
DRSG ADAPTIC 3X8 NADH LF (GAUZE/BANDAGES/DRESSINGS) ×2 IMPLANT
DRSG PAD ABDOMINAL 8X10 ST (GAUZE/BANDAGES/DRESSINGS) ×4 IMPLANT
DURAPREP 26ML APPLICATOR (WOUND CARE) ×2 IMPLANT
ELECT CAUTERY BLADE 6.4 (BLADE) ×2 IMPLANT
ELECT REM PT RETURN 9FT ADLT (ELECTROSURGICAL) ×2
ELECTRODE REM PT RTRN 9FT ADLT (ELECTROSURGICAL) ×1 IMPLANT
EVACUATOR 1/8 PVC DRAIN (DRAIN) IMPLANT
FACESHIELD LNG OPTICON STERILE (SAFETY) ×5 IMPLANT
FLOSEAL 10ML (HEMOSTASIS) IMPLANT
GLOVE BIOGEL PI IND STRL 8 (GLOVE) ×1 IMPLANT
GLOVE BIOGEL PI IND STRL 8.5 (GLOVE) ×1 IMPLANT
GLOVE BIOGEL PI INDICATOR 8 (GLOVE) ×1
GLOVE BIOGEL PI INDICATOR 8.5 (GLOVE) ×1
GLOVE ECLIPSE 8.0 STRL XLNG CF (GLOVE) ×4 IMPLANT
GLOVE SURG ORTHO 8.5 STRL (GLOVE) ×2 IMPLANT
GOWN PREVENTION PLUS XXLARGE (GOWN DISPOSABLE) ×2 IMPLANT
GOWN STRL NON-REIN LRG LVL3 (GOWN DISPOSABLE) ×4 IMPLANT
HANDPIECE INTERPULSE COAX TIP (DISPOSABLE) ×2
KIT BASIN OR (CUSTOM PROCEDURE TRAY) ×2 IMPLANT
KIT ROOM TURNOVER OR (KITS) ×2 IMPLANT
MANIFOLD NEPTUNE II (INSTRUMENTS) ×2 IMPLANT
NEEDLE 22X1 1/2 (OR ONLY) (NEEDLE) IMPLANT
NS IRRIG 1000ML POUR BTL (IV SOLUTION) ×2 IMPLANT
PACK TOTAL JOINT (CUSTOM PROCEDURE TRAY) ×2 IMPLANT
PAD ARMBOARD 7.5X6 YLW CONV (MISCELLANEOUS) ×4 IMPLANT
PAD CAST 4YDX4 CTTN HI CHSV (CAST SUPPLIES) ×1 IMPLANT
PADDING CAST COTTON 4X4 STRL (CAST SUPPLIES) ×2
PADDING CAST COTTON 6X4 STRL (CAST SUPPLIES) ×2 IMPLANT
SET HNDPC FAN SPRY TIP SCT (DISPOSABLE) ×1 IMPLANT
SPONGE GAUZE 4X4 12PLY (GAUZE/BANDAGES/DRESSINGS) ×2 IMPLANT
STAPLER VISISTAT 35W (STAPLE) ×2 IMPLANT
SUCTION FRAZIER TIP 10 FR DISP (SUCTIONS) ×2 IMPLANT
SUT BONE WAX W31G (SUTURE) ×2 IMPLANT
SUT ETHIBOND NAB CT1 #1 30IN (SUTURE) ×6 IMPLANT
SUT MNCRL AB 3-0 PS2 18 (SUTURE) ×2 IMPLANT
SUT VIC AB 0 CT1 27 (SUTURE) ×2
SUT VIC AB 0 CT1 27XBRD ANBCTR (SUTURE) ×1 IMPLANT
SUT VIC AB 1 CT1 27 (SUTURE) ×2
SUT VIC AB 1 CT1 27XBRD ANBCTR (SUTURE) ×1 IMPLANT
SYR CONTROL 10ML LL (SYRINGE) IMPLANT
TOWEL OR 17X24 6PK STRL BLUE (TOWEL DISPOSABLE) ×2 IMPLANT
TOWEL OR 17X26 10 PK STRL BLUE (TOWEL DISPOSABLE) ×2 IMPLANT
TRAY FOLEY CATH 14FR (SET/KITS/TRAYS/PACK) ×1 IMPLANT
WATER STERILE IRR 1000ML POUR (IV SOLUTION) ×4 IMPLANT
WRAP KNEE MAXI GEL POST OP (GAUZE/BANDAGES/DRESSINGS) ×2 IMPLANT

## 2012-11-02 NOTE — Progress Notes (Signed)
Orthopedic Tech Progress Note Patient Details:  Marc Mills 1945/10/26 [id number] CPM applied to Left LE with appropriate settings. OHF applied to bed.  CPM Left Knee CPM Left Knee: On Left Knee Flexion (Degrees): 60 Left Knee Extension (Degrees): 0   Asia R Thompson 11/02/2012, 1:36 PM

## 2012-11-02 NOTE — Progress Notes (Signed)
Patient ID: Marc Mills, male   DOB: Dec 13, 1945, 67 y.o.   MRN: [id number] PATIENT ID:      Marc Mills  MRN:     [id number] DOB/AGE:    14-Jul-1945 / 67 y.o.       OPERATIVE REPORT    DATE OF PROCEDURE:  11/02/2012       PREOPERATIVE DIAGNOSIS:   Left Knee Osteoarthritis-end stage                                                       Estimated body mass index is 46.94 kg/(m^2) as calculated from the following:   Height as of 10/27/12: 6' (1.829 m).   Weight as of 05/04/12: 157.035 kg (346 lb 3.2 oz).     POSTOPERATIVE DIAGNOSIS:   left knee osteoarthritis-same                                                                   Estimated body mass index is 46.94 kg/(m^2) as calculated from the following:   Height as of 10/27/12: 6' (1.829 m).   Weight as of 05/04/12: 157.035 kg (346 lb 3.2 oz).     PROCEDURE:  Procedure(s): TOTAL KNEE ARTHROPLASTY with revision tibia left     SURGEON:  Norlene Campbell, MD    ASSISTANT:   Jacqualine Code, PA-C   (Present and scrubbed throughout the case, critical for assistance with exposure, retraction, instrumentation, and closure.)          ANESTHESIA: regional and general     DRAINS: (left knee) Hemovact drain(s) in the closed with  Suction Clamped :      TOURNIQUET TIME:  Total Tourniquet Time Documented: Thigh (Left) - 78 minutes Total: Thigh (Left) - 78 minutes     COMPLICATIONS:  None   CONDITION:  stable  PROCEDURE IN DETAIL: [id number]   Marc Mills 11/02/2012, 12:01 PM

## 2012-11-02 NOTE — Anesthesia Preprocedure Evaluation (Signed)
Anesthesia Evaluation  Patient identified by MRN, date of birth, ID band Patient awake    Reviewed: Allergy & Precautions, H&P , NPO status , Patient's Chart, lab work & pertinent test results, reviewed documented beta blocker date and time   Airway Mallampati: II TM Distance: >3 FB Neck ROM: full    Dental   Pulmonary sleep apnea and Continuous Positive Airway Pressure Ventilation , COPD breath sounds clear to auscultation        Cardiovascular + Peripheral Vascular Disease negative cardio ROS  Rhythm:regular     Neuro/Psych negative neurological ROS  negative psych ROS   GI/Hepatic negative GI ROS, Neg liver ROS,   Endo/Other  negative endocrine ROSMorbid obesity  Renal/GU negative Renal ROS  negative genitourinary   Musculoskeletal   Abdominal   Peds  Hematology negative hematology ROS (+)   Anesthesia Other Findings See surgeon's H&P   Reproductive/Obstetrics negative OB ROS                           Anesthesia Physical Anesthesia Plan  ASA: III  Anesthesia Plan: General   Post-op Pain Management:    Induction: Intravenous  Airway Management Planned: Oral ETT and Video Laryngoscope Planned  Additional Equipment:   Intra-op Plan:   Post-operative Plan: Extubation in OR  Informed Consent: I have reviewed the patients History and Physical, chart, labs and discussed the procedure including the risks, benefits and alternatives for the proposed anesthesia with the patient or authorized representative who has indicated his/her understanding and acceptance.   Dental Advisory Given  Plan Discussed with: CRNA and Surgeon  Anesthesia Plan Comments:         Anesthesia Quick Evaluation

## 2012-11-02 NOTE — Transfer of Care (Signed)
Immediate Anesthesia Transfer of Care Note  Patient: Marc Mills  Procedure(s) Performed: Procedure(s): TOTAL KNEE ARTHROPLASTY with revision tibia (Left)  Patient Location: PACU  Anesthesia Type:General  Level of Consciousness: sedated  Airway & Oxygen Therapy: Patient Spontanous Breathing and Patient connected to nasal cannula oxygen  Post-op Assessment: Report given to PACU RN and Post -op Vital signs reviewed and stable  Post vital signs: stable  Complications: No apparent anesthesia complications

## 2012-11-02 NOTE — Progress Notes (Signed)
teds are unable to fit properly.  Left off.   Pt  PTT   Done DOS

## 2012-11-02 NOTE — H&P (Signed)
  The recent History & Physical has been reviewed. I have personally examined the patient today. There is no interval change to the documented History & Physical. The patient would like to proceed with the procedure.  Norlene Campbell W 11/02/2012,  9:36 AM

## 2012-11-02 NOTE — Anesthesia Postprocedure Evaluation (Signed)
Anesthesia Post Note  Patient: Marc Mills  Procedure(s) Performed: Procedure(s) (LRB): TOTAL KNEE ARTHROPLASTY with revision tibia (Left)  Anesthesia type: General  Patient location: PACU  Post pain: Pain level controlled  Post assessment: Patient's Cardiovascular Status Stable  Last Vitals:  Filed Vitals:   11/02/12 1330  BP: 147/80  Pulse: 74  Temp:   Resp: 14    Post vital signs: Reviewed and stable  Level of consciousness: alert  Complications: No apparent anesthesia complications

## 2012-11-02 NOTE — Progress Notes (Signed)
UR COMPLETED  

## 2012-11-02 NOTE — Anesthesia Procedure Notes (Signed)
Anesthesia Regional Block:  Femoral nerve block  Pre-Anesthetic Checklist: ,, timeout performed, Correct Patient, Correct Site, Correct Laterality, Correct Procedure, Correct Position, site marked, Risks and benefits discussed,  Surgical consent,  Pre-op evaluation,  At surgeon's request and post-op pain management  Laterality: Left  Prep: chloraprep       Needles:   Needle Type: Other     Needle Length: 9cm  Needle Gauge: 21    Additional Needles:  Procedures: ultrasound guided (picture in chart) Femoral nerve block Narrative:  Start time: 11/02/2012 9:35 AM End time: 11/02/2012 9:41 AM Injection made incrementally with aspirations every 5 mL.  Performed by: Personally  Anesthesiologist: C. Mariposa Shores MD  Additional Notes: Ultrasound guidance used to: id relevant anatomy, confirm needle position, local anesthetic spread, avoidance of vascular puncture. Picture saved. No complications. Block performed personally by Janetta Hora. Gelene Mink, MD    Femoral nerve block

## 2012-11-02 NOTE — Progress Notes (Signed)
ANTICOAGULATION CONSULT NOTE - Initial Consult  Pharmacy Consult for Warfarin Indication:  Hx. PE and s/p Ortho procedure  Allergies  Allergen Reactions  . Hydrocodone Itching and Other (See Comments)    Gives me chills  . Oxycodone Itching and Other (See Comments)    Gives me chills   Patient Measurements: Wt Readings from Last 3 Encounters:  10/27/12 339 lb 9.6 oz (154.042 kg)  10/18/12 343 lb 9.6 oz (155.856 kg)  05/04/12 346 lb 3.2 oz (157.035 kg)   Ht Readings from Last 3 Encounters:  10/27/12 6' (1.829 m)  10/18/12 6' (1.829 m)  05/04/12 5' 10.5" (1.791 m)   Vital Signs: Temp: 98.3 F (36.8 C) (07/01 1530) Temp src: Oral (07/01 0831) BP: 154/83 mmHg (07/01 1530) Pulse Rate: 86 (07/01 1530)  Labs:  Recent Labs  11/02/12 0850  APTT 35  LABPROT 13.3  INR 1.03   Medical History: Past Medical History  Diagnosis Date  . Morbid obesity   . History of pulmonary embolus (PE)   . History of lymphoma   . Incisional hernia   . Arthritis   . Blood transfusion without reported diagnosis   . DVT (deep venous thrombosis)     2011 right leg  . Anorectal fistula   . OSA on CPAP     cpap  10 yrs  . Varicose veins of lower extremities     pe  . Cancer     lymphoma hx   Medications:  Prescriptions prior to admission  Medication Sig Dispense Refill  . warfarin (COUMADIN) 5 MG tablet Take 2.5-5 mg by mouth daily at 6 PM. Take 5mg  on Mon/Tues/Wed/Fri/Sat...then take 2.5mg  on Thur/Sun.       Assessment: 67yo male admitted for chronic knee pain and is now s/p total knee arthroplasty.  He has a past medical history significant for PE/DVT and is on chronic Warfarin therapy.  We have been asked to resume his therapy now that he is post-op.  His INR today is 1.03 but he has been therapeutic on a home regimen of 5mg  daily except 2.5mg  on Thursday and Sunday.  His CBC was stable this morning and there is no noted bleeding post-op.  Goal of Therapy:  INR 2-3 Monitor platelets  by anticoagulation protocol: Yes   Plan:  1.  Begin Warfarin 5 mg daily except 2.5 mg on Thurs/Sunday and monitor his INR for effect. 2.  Monitor closely for s/s of bleeding 3.  Will discontinue Lovenox when INR >2.0.  Nadara Mustard, PharmD., MS Clinical Pharmacist Pager:  [phone] Thank you for allowing pharmacy to be part of this patients care team. 11/02/2012,4:41 PM

## 2012-11-03 ENCOUNTER — Encounter (HOSPITAL_COMMUNITY): Payer: Self-pay | Admitting: Orthopaedic Surgery

## 2012-11-03 LAB — CBC
HCT: 38.3 % — ABNORMAL LOW (ref 39.0–52.0)
Hemoglobin: 12.8 g/dL — ABNORMAL LOW (ref 13.0–17.0)
MCH: 30 pg (ref 26.0–34.0)
MCHC: 33.4 g/dL (ref 30.0–36.0)
MCV: 89.7 fL (ref 78.0–100.0)
Platelets: 288 10*3/uL (ref 150–400)
RBC: 4.27 MIL/uL (ref 4.22–5.81)
RDW: 15.5 % (ref 11.5–15.5)
WBC: 12.4 10*3/uL — ABNORMAL HIGH (ref 4.0–10.5)

## 2012-11-03 LAB — BASIC METABOLIC PANEL
BUN: 13 mg/dL (ref 6–23)
CO2: 27 mEq/L (ref 19–32)
Calcium: 8.4 mg/dL (ref 8.4–10.5)
Chloride: 102 mEq/L (ref 96–112)
Creatinine, Ser: 0.62 mg/dL (ref 0.50–1.35)
GFR calc Af Amer: >90 mL/min (ref >90)
GFR calc non Af Amer: >90 mL/min (ref >90)
Glucose, Bld: 136 mg/dL — ABNORMAL HIGH (ref 70–99)
Potassium: 4.5 mEq/L (ref 3.5–5.1)
Sodium: 138 mEq/L (ref 135–145)

## 2012-11-03 LAB — PROTIME-INR
INR: 1.16 (ref 0.00–1.49)
Prothrombin Time: 14.6 seconds (ref 11.6–15.2)

## 2012-11-03 MED ORDER — WARFARIN SODIUM 7.5 MG PO TABS
7.5000 mg | ORAL_TABLET | Freq: Once | ORAL | Status: AC
  Administered 2012-11-03: 7.5 mg via ORAL
  Filled 2012-11-03: qty 1

## 2012-11-03 NOTE — Evaluation (Signed)
Physical Therapy Evaluation Patient Details Name: Marc Mills MRN: [id number] DOB: [birth year] Today's Date: 11/03/2012 Time: 6578-4696 PT Time Calculation (min): 23 min  PT Assessment / Plan / Recommendation History of Present Illness  s/p L TKA elective  Clinical Impression  Pt is s/p L TKA POD#1 resulting in the deficits listed below (see PT Problem List). Pt moving well this morning. Pt will benefit from skilled PT to increase their independence and safety with mobility to allow discharge home with wife and HHPT. Anticipate steady progress due to high motivation of pt.      PT Assessment  Patient needs continued PT services    Follow Up Recommendations  Home health PT;Supervision/Assistance - 24 hour    Does the patient have the potential to tolerate intense rehabilitation      Barriers to Discharge   none    Equipment Recommendations  Rolling walker with 5" wheels    Recommendations for Other Services     Frequency 7X/week    Precautions / Restrictions Precautions Precautions: Fall;Knee Precaution Booklet Issued: Yes (comment) Precaution Comments: no pillow under knee; pt issued TKA protocol handout  Restrictions Weight Bearing Restrictions: Yes LLE Weight Bearing: Partial weight bearing LLE Partial Weight Bearing Percentage or Pounds: 50   Pertinent Vitals/Pain 3/10; premedicated       Mobility  Bed Mobility Bed Mobility: Supine to Sit Supine to Sit: 5: Supervision;HOB elevated;With rails Details for Bed Mobility Assistance: verbal cues for hand placement and sequencing  Transfers Transfers: Sit to Stand;Stand to Sit Sit to Stand: 4: Min guard;From bed;From elevated surface Stand to Sit: 4: Min guard;To chair/3-in-1;With armrests Details for Transfer Assistance: verbal cues for hand placement and safety with RW; pt able to control descent to chair with arms while allowing L LE to flex; min guard to steady secondary to PWB status on pain with WB   Ambulation/Gait Ambulation/Gait Assistance: 4: Min guard Ambulation Distance (Feet): 60 Feet Assistive device: Rolling walker Ambulation/Gait Assistance Details: verbal cues for gt sequencing and management of RW; pt able to progress to step through gt with cues  Gait Pattern: Step-to pattern;Step-through pattern Gait velocity: decreased due to pain  Stairs: No Wheelchair Mobility Wheelchair Mobility: No    Exercises Total Joint Exercises Ankle Circles/Pumps: AROM;Both;10 reps;Seated Long Arc Quad: AROM;Left;10 reps;Seated   PT Diagnosis: Difficulty walking;Acute pain  PT Problem List: Decreased strength;Decreased range of motion;Decreased mobility;Decreased knowledge of use of DME;Pain PT Treatment Interventions: DME instruction;Gait training;Stair training;Functional mobility training;Therapeutic activities;Therapeutic exercise;Balance training;Neuromuscular re-education;Patient/family education     PT Goals(Current goals can be found in the care plan section) Acute Rehab PT Goals Patient Stated Goal: to go home tomorrow PT Goal Formulation: With patient Time For Goal Achievement: 11/10/12 Potential to Achieve Goals: Good  Visit Information  Last PT Received On: 11/03/12 Assistance Needed: +1 History of Present Illness: s/p L TKA elective       Prior Functioning  Home Living Family/patient expects to be discharged to:: Private residence Living Arrangements: Spouse/significant other Available Help at Discharge: Family;Available 24 hours/day Type of Home: House Home Access: Stairs to enter [employer] of Steps: 2 Entrance Stairs-Rails: Right Home Layout: One level Home Equipment: Cane - single point;Bedside commode;Shower seat Prior Function Level of Independence: Independent (uses SPC PRN) Communication Communication: No difficulties Dominant Hand: Right    Cognition  Cognition Arousal/Alertness: Awake/alert Behavior During Therapy: WFL for tasks  assessed/performed Overall Cognitive Status: Within Functional Limits for tasks assessed    Extremity/Trunk Assessment Upper Extremity Assessment  Upper Extremity Assessment: Defer to OT evaluation Lower Extremity Assessment Lower Extremity Assessment: LLE deficits/detail LLE Deficits / Details: limited ROM and strength in L knee due to pain; grossly 3/5 AROM 0 to 74 LLE: Unable to fully assess due to pain LLE Sensation:  (WFL) Cervical / Trunk Assessment Cervical / Trunk Assessment: Normal   Balance Balance Balance Assessed: Yes Static Sitting Balance Static Sitting - Balance Support: No upper extremity supported;Feet supported Static Sitting - Level of Assistance: 5: Stand by assistance Static Sitting - Comment/# of Minutes: tolerated sitting EOB ~27min to donn underware   End of Session PT - End of Session Equipment Utilized During Treatment: Gait belt Activity Tolerance: Patient tolerated treatment well Patient left: in chair;with call bell/phone within reach;with family/visitor present Nurse Communication: Mobility status CPM Left Knee CPM Left Knee: On Left Knee Flexion (Degrees): 60 Left Knee Extension (Degrees): 0  GP     Shelva Majestic [place], [place] [phone] 11/03/2012, 10:56 AM

## 2012-11-03 NOTE — Progress Notes (Signed)
Physical Therapy Treatment Patient Details Name: Marc Mills MRN: [id number] DOB: May 01, 1946 Today's Date: 11/03/2012 Time: 1420-1450 PT Time Calculation (min): 30 min  PT Assessment / Plan / Recommendation  PT Comments   Pt progressing well with therapy and is highly motivated to D/C home tomorrow. Able to increased amb distance this afternoon along with theraex. Will plan to address stairs and increase amb at next session.   Follow Up Recommendations  Home health PT;Supervision/Assistance - 24 hour     Does the patient have the potential to tolerate intense rehabilitation     Barriers to Discharge        Equipment Recommendations  Rolling walker with 5" wheels    Recommendations for Other Services    Frequency 7X/week   Progress towards PT Goals Progress towards PT goals: Progressing toward goals  Plan Current plan remains appropriate    Precautions / Restrictions Precautions Precautions: Fall;Knee Precaution Comments: no pillow under knee; pt issued TKA protocol handout  Restrictions Weight Bearing Restrictions: Yes LLE Weight Bearing: Partial weight bearing LLE Partial Weight Bearing Percentage or Pounds: 50   Pertinent Vitals/Pain 5-6/10; RN notified pt requesting pain medicine.     Mobility  Bed Mobility Bed Mobility: Supine to Sit Supine to Sit: 4: Min guard;HOB elevated;With rails Details for Bed Mobility Assistance: min guard to initiate movement of L LE; pt able to complete sup to sit transfer with verbal cues and using UEs to advance L LE Transfers Transfers: Sit to Stand;Stand to Sit Sit to Stand: 4: Min guard;From bed;From elevated surface Stand to Sit: 4: Min guard;To chair/3-in-1;With armrests Details for Transfer Assistance: min guard to steady due to PWB status; min cues for hand placement and safety  Ambulation/Gait Ambulation/Gait Assistance: 5: Supervision Ambulation Distance (Feet): 80 Feet Assistive device: Rolling walker Ambulation/Gait  Assistance Details: verbal cues for gt sequencing and management of RW; demo decreased knee flex on L LE and decreased heel strike  Gait Pattern: Step-to pattern;Decreased stance time - left;Decreased step length - right;Decreased hip/knee flexion - left;Trunk flexed Gait velocity: decreased due to pain  Stairs: No Wheelchair Mobility Wheelchair Mobility: No    Exercises Total Joint Exercises Ankle Circles/Pumps: AROM;Both;10 reps;Seated Quad Sets: AROM;Left;10 reps;Seated Hip ABduction/ADduction: AAROM;Left;10 reps;Seated Long Arc Quad: AROM;Left;10 reps;Seated Goniometric ROM: AROM in sitting 0 to 80 degrees    PT Diagnosis:    PT Problem List:   PT Treatment Interventions:     PT Goals (current goals can now be found in the care plan section) Acute Rehab PT Goals Patient Stated Goal: to go home tomorrow PT Goal Formulation: With patient Time For Goal Achievement: 11/10/12 Potential to Achieve Goals: Good  Visit Information  Last PT Received On: 11/03/12 Assistance Needed: +1 History of Present Illness: s/p L TKA elective    Subjective Data  Subjective: pt lying supine in CPM; eager to walk. States " i have been sore and hurting in this machine"  Patient Stated Goal: to go home tomorrow   Cognition  Cognition Arousal/Alertness: Awake/alert Behavior During Therapy: WFL for tasks assessed/performed Overall Cognitive Status: Within Functional Limits for tasks assessed    Balance  Balance Balance Assessed: No  End of Session PT - End of Session Equipment Utilized During Treatment: Gait belt Activity Tolerance: Patient tolerated treatment well Patient left: in chair;with call bell/phone within reach;with family/visitor present Nurse Communication: Mobility status   GP     Donell Sievert, [place] [phone] 11/03/2012, 3:33 PM

## 2012-11-03 NOTE — Progress Notes (Signed)
RT note, late entry: Pt. Has his home CPAP with nasal mask set up at the bedside. Pt. Stated that he would place himself on CPAP when ready for bed.

## 2012-11-03 NOTE — Progress Notes (Signed)
SW received a consult for possible placement. PT  At this time is recommending home with HH and not SNF. CSW will make CM aware. Clinical Social Worker will sign off for now as social work intervention is no longer needed. Please consult us again if new need arises.   Myana Schlup, MSW [phone] 

## 2012-11-03 NOTE — Progress Notes (Signed)
ANTICOAGULATION CONSULT NOTE - Follow Up Consult  Pharmacy Consult for : Coumadin with Lovenox bridging Indication:  Hx PE/DVT, VTE prophylaxis following L-TKA  Dosing Weight: 154 kg  Labs:  Recent Labs  11/02/12 0850 11/02/12 1747 11/03/12 0425  HGB  --  14.1 12.8*  HCT  --  42.9 38.3*  PLT  --  341 288  APTT 35  --   --   LABPROT 13.3  --  14.6  INR 1.03  --  1.16  CREATININE  --  0.66 0.62   Lab Results  Component Value Date   INR 1.16 11/03/2012   INR 1.03 11/02/2012   INR 2.21* 10/27/2012   Medications:  Scheduled:  . citalopram  20 mg Oral Daily  . docusate sodium  100 mg Oral BID  . enoxaparin (LOVENOX) injection  30 mg Subcutaneous Q12H  . Warfarin - Pharmacist Dosing Inpatient   Does not apply q1800   Assessment:  67 y/o male POD # 1 following L-TKA who has been on chronic Coumadin PTA for history of PE/DVT.  Coumadin restarted yesterday.  INR 1.16, Hgb 12.8, Platelets 288.  No bleeding complications noted   Goal of Therapy:  INR 2-3   Plan:  Continue Lovenox bridging until INR therapeutic [ > 2 ].  Coumadin 7.5 mg x 1 today.  Daily INR's, CBC.  Monitor for bleeding complications.    Marc Mills, Deetta Perla.D 11/03/2012, 2:07 PM

## 2012-11-03 NOTE — Evaluation (Signed)
Occupational Therapy Evaluation Patient Details Name: Marc Mills MRN: [id number] DOB: October 12, 1945 Today's Date: 11/03/2012 Time: 0102-7253 OT Time Calculation (min): 23 min  OT Assessment / Plan / Recommendation History of present illness s/p L TKA elective   Clinical Impression   Pt overall close supervision for functional mobility and transfers.  Needs min assist to supervision for selfcare tasks.  He is aware of AE to assist with LB selfcare but will likely have his wife assist him as needed.  Will need a wide 3:1 for home.  The one he has borrowed will not be enough to support his current weight 339 lbs.  No further OT needs.    OT Assessment  Patient does not need any further OT services    Follow Up Recommendations  No OT follow up       Equipment Recommendations  3 in 1 bedside comode;Other (comment) (will need wide BSC to support 300 + lbs)          Precautions / Restrictions Precautions Precautions: Fall;Knee Precaution Comments: no pillow under knee; pt issued TKA protocol handout  Restrictions Weight Bearing Restrictions: Yes LLE Weight Bearing: Partial weight bearing LLE Partial Weight Bearing Percentage or Pounds: 50   Pertinent Vitals/Pain Pt reports pain 5/10 in the left knee, meds given prior to session    ADL  Eating/Feeding: Simulated;Independent Where Assessed - Eating/Feeding: Edge of bed Grooming: Simulated;Supervision/safety Where Assessed - Grooming: Supported sitting Upper Body Bathing: Simulated;Set up Where Assessed - Upper Body Bathing: Unsupported sitting Lower Body Bathing: Simulated;Minimal assistance Where Assessed - Lower Body Bathing: Supported sit to stand Upper Body Dressing: Simulated;Set up Where Assessed - Upper Body Dressing: Unsupported sitting Lower Body Dressing: Performed;Minimal assistance Where Assessed - Lower Body Dressing: Supported sit to stand Toilet Transfer: Research scientist (life sciences) Method:  Other (comment) (ambulating with RW) Acupuncturist: Bedside commode Toileting - Clothing Manipulation and Hygiene: Simulated;Supervision/safety Where Assessed - Toileting Clothing Manipulation and Hygiene: Sit to stand from 3-in-1 or toilet Tub/Shower Transfer Method: Not assessed Equipment Used: Rolling walker Transfers/Ambulation Related to ADLs: Pt currently ambulating with the RW to the bathroom with supervision.   ADL Comments: Pt is currently min assist for LB dressing and bathing secondary to not being able to reach the LLE for donning his sock or shoe.  Pt reports that his wife will help him with this but he does have access to a sockaide as well.  Will need wide 3:1 for home as the one he has is standard size which will not fit the pt .         Visit Information  Last OT Received On: 11/03/12 Assistance Needed: +1 History of Present Illness: s/p L TKA elective       Prior Functioning     Home Living Family/patient expects to be discharged to:: Private residence Living Arrangements: Spouse/significant other Available Help at Discharge: Family;Available 24 hours/day Type of Home: House Home Access: Stairs to enter [employer] of Steps: 2 Entrance Stairs-Rails: Right Home Layout: One level Home Equipment: Cane - single point;Bedside commode;Shower seat Prior Function Level of Independence: Independent (uses SPC PRN) Communication Communication: No difficulties Dominant Hand: Right         Vision/Perception Vision - History Baseline Vision: Wears glasses all the time Patient Visual Report: No change from baseline Vision - Assessment Eye Alignment: Within Functional Limits Vision Assessment: Vision not tested Perception Perception: Within Functional Limits Praxis Praxis: Intact   Cognition  Cognition Arousal/Alertness: Awake/alert Behavior During  Therapy: WFL for tasks assessed/performed Overall Cognitive Status: Within Functional  Limits for tasks assessed    Extremity/Trunk Assessment Upper Extremity Assessment Upper Extremity Assessment: Overall WFL for tasks assessed Lower Extremity Assessment Lower Extremity Assessment: Defer to PT evaluation Cervical / Trunk Assessment Cervical / Trunk Assessment: Normal     Mobility Bed Mobility Bed Mobility: Sit to Supine Supine to Sit: 4: Min guard;HOB elevated;With rails Sit to Supine: 4: Min assist;HOB flat Details for Bed Mobility Assistance: Pt needs min assist to bring his LLE back into the bed. Transfers Transfers: Sit to Stand Sit to Stand: 5: Supervision;With upper extremity assist;From chair/3-in-1 Stand to Sit: 5: Supervision;With upper extremity assist;To bed Details for Transfer Assistance: Pt needs min cues to reach back for the surface he is sitting on when transitioning from standing to sitting.        Balance Balance Balance Assessed: Yes Static Sitting Balance Static Sitting - Balance Support: No upper extremity supported Static Sitting - Level of Assistance: 5: Stand by assistance Static Standing Balance Static Standing - Balance Support: Right upper extremity supported;Left upper extremity supported Static Standing - Level of Assistance: 5: Stand by assistance   End of Session OT - End of Session Equipment Utilized During Treatment: Rolling walker Activity Tolerance: Patient tolerated treatment well Patient left: in bed;in CPM CPM Left Knee CPM Left Knee: On Left Knee Flexion (Degrees): 45     Yariana Hoaglund OTR/L Pager number [phone] 11/03/2012, 4:08 PM

## 2012-11-03 NOTE — Progress Notes (Signed)
Patient ID: Marc Mills, male   DOB: 11/26/1945, 67 y.o.   MRN: [id number] PATIENT ID: Marc Mills        MRN:  [id number]          DOB/AGE: 02-Sep-1945 / 67 y.o.    Norlene Campbell, MD   Jacqualine Code, PA-C [address] [place], [place]  [address]                             [phone]   PROGRESS NOTE  Subjective:  negative for Chest Pain  negative for Shortness of Breath  negative for Nausea/Vomiting   negative for Calf Pain    Tolerating Diet: yes         Patient reports pain as mild.     "doing well"-minimal pain  Objective: Vital signs in last 24 hours:   Patient Vitals for the past 24 hrs:  BP Temp Temp src Pulse Resp SpO2 Height Weight  11/03/12 0621 120/66 mmHg 98.2 F (36.8 C) Oral 74 16 96 % - -  11/02/12 2059 120/58 mmHg 98.4 F (36.9 C) Oral 95 16 97 % - -  11/02/12 1700 - - - - - - 6' (1.829 m) 154.042 kg (339 lb 9.6 oz)  11/02/12 1600 167/85 mmHg 98.3 F (36.8 C) - 81 16 94 % - -  11/02/12 1530 154/83 mmHg 98.3 F (36.8 C) - 86 21 99 % - -  11/02/12 1515 - - - 82 21 98 % - -  11/02/12 1500 149/73 mmHg - - 82 15 100 % - -  11/02/12 1445 - - - 56 22 97 % - -  11/02/12 1430 159/84 mmHg - - 64 16 95 % - -  11/02/12 1415 - - - 77 16 94 % - -  11/02/12 1400 159/90 mmHg - - 81 21 100 % - -  11/02/12 1345 121/68 mmHg - - 81 13 99 % - -  11/02/12 1330 147/80 mmHg - - 74 14 93 % - -  11/02/12 1315 148/72 mmHg - - 67 22 100 % - -  11/02/12 1300 131/73 mmHg - - 79 18 94 % - -  11/02/12 1245 143/91 mmHg - - 78 18 94 % - -  11/02/12 1236 - 97.6 F (36.4 C) - - - - - -  11/02/12 0950 118/67 mmHg - - 57 18 100 % - -  11/02/12 0949 - - - 57 18 100 % - -  11/02/12 0946 - - - 55 18 100 % - -  11/02/12 0945 125/68 mmHg - - 61 18 100 % - -  11/02/12 0831 117/81 mmHg 97.9 F (36.6 C) Oral 60 20 98 % - -      Intake/Output from previous day:   07/01 0701 - 07/02 0700 In: 2220 [P.O.:120; I.V.:2100] Out: 3235 [Urine:3100; Drains:60]   Intake/Output  this shift:       Intake/Output     07/01 0701 - 07/02 0700 07/02 0701 - 07/03 0700   P.O. 120    I.V. (mL/kg) 2100 (13.6)    Total Intake(mL/kg) 2220 (14.4)    Urine (mL/kg/hr) 3100    Drains 60    Blood 75    Total Output 3235     Net -1015             LABORATORY DATA:  Recent Labs  10/27/12 1159 11/02/12 1747 11/03/12 0425  WBC 6.8 16.1*  12.4*  HGB 14.2 14.1 12.8*  HCT 41.5 42.9 38.3*  PLT 357 341 288    Recent Labs  10/27/12 1159 11/02/12 1747 11/03/12 0425  NA 139  --  138  K 4.3  --  4.5  CL 101  --  102  CO2 31  --  27  BUN 13  --  13  CREATININE 0.61 0.66 0.62  GLUCOSE 86  --  136*  CALCIUM 9.1  --  8.4   Lab Results  Component Value Date   INR 1.16 11/03/2012   INR 1.03 11/02/2012   INR 2.21* 10/27/2012   PROTIME 17.0 10/23/2008   PROTIME 14.2 10/03/2008    Recent Radiographic Studies :  Chest 2 View  10/27/2012   *RADIOLOGY REPORT*  Clinical Data: Preoperative radiograph.  Left total knee arthroplasty.  History of lymphoma.  CHEST - 2 VIEW  Comparison: 12/12/2011 PET CT.  Chest radiograph 12/27/2009.  Findings: Chronic prominence of the right infrahilar lung parenchyma, unchanged from 12/27/2009. The cardiopericardial silhouette is within normal limits.  No airspace disease.  No pleural effusion.  IMPRESSION: No active cardiopulmonary disease or interval change.   Original Report Authenticated By: Andreas Newport, M.D.     Examination:  General appearance: alert, cooperative and no distress  Wound Exam: clean, dry, intact   Drainage:  None: wound tissue dry  Motor Exam: EHL, FHL, Anterior Tibial and Posterior Tibial Intact  Sensory Exam: Superficial Peroneal, Deep Peroneal and Tibial normal  Vascular Exam: Normal  Assessment:    1 Day Post-Op  Procedure(s) (LRB): TOTAL KNEE ARTHROPLASTY with revision tibia (Left)  ADDITIONAL DIAGNOSIS:  Principal Problem:   Osteoarthritis of left knee  no new problems   Plan: Physical Therapy as  ordered Partial Weight Bearing @ 50% (PWB)  DVT Prophylaxis:  Lovenox and Coumadin  DISCHARGE PLAN: Home  DISCHARGE NEEDS: HHPT, CPM, Walker and 3-in-1 comode seat   OOB with PT,sitting up reading the paper, eating breakfast- comfortable     Valeria Batman 11/03/2012 7:41 AM

## 2012-11-03 NOTE — Op Note (Signed)
NAMEMarland Kitchen  DUSTON, SMOLENSKI NO.:  [id number]  MEDICAL RECORD NO.:  [id number]  LOCATION:  5N19C                        FACILITY:  [facility]  PHYSICIAN:  Claude Manges. Coreon Simkins, M.D.DATE OF BIRTH:  May 19, 1945  DATE OF PROCEDURE:  11/02/2012 DATE OF DISCHARGE:                              OPERATIVE REPORT   PREOPERATIVE DIAGNOSIS: 1. End-stage osteoarthritis, left knee. 2. Morbid obesity -- BMI 47.  POSTOPERATIVE DIAGNOSIS: 1. End-stage osteoarthritis, left knee. 2. Morbid obesity -- BMI 47.  PROCEDURE:  Left total knee replacement.  SURGEON:  Claude Manges. Cleophas Dunker, M.D.  ASSISTANT:  Arlys John D. Petrarca, P.A.-C.,  ANESTHESIA:  General with supplemental femoral nerve block.  COMPLICATIONS:  None.  COMPONENTS:  DePuy LCS large femoral component #4, rotating keeled tibial tray with a 10-mm polyethylene bridging bearing.  I did use the revision tibial metallic component based on the patient's size.  I used the large metal back rotating 3 PEG patella.  Components were secured with polymethyl methacrylate.  PROCEDURE IN DETAIL:  Mr. Gluth was met in the holding area, identified the left knee as appropriate operative site.  He did receive a preoperative femoral nerve block per anesthesia.  The patient was then transported to room #7 and did place comfortably on operating room table.  He was placed under general anesthesia without difficulty.  Nursing staff inserted a Foley catheter.  Urine was clear. Time-out was called.  Tourniquet was then applied to the left thigh.  The left lower extremity was then prepped with chlorhexidine prep from the tourniquet to the tips of the toes and then with DuraPrep along the same area.  Sterile draping was performed with the extremity still elevated.  Esmarch exsanguinated with a proximal tourniquet at 360 mmHg.  A midline longitudinal incision was made centered about the patella and extending from the superior pouch to tibial  tubercle via sharp dissection.  Incision was carried down to subcutaneous tissue.  The first layer of capsule was incised in midline and medial parapatellar incision was made with the Bovie.  The joint was not entered.  There was a small clear yellow joint effusion.  Patella was easily everted 180 degrees and knee flexed to 90 degrees.  There was a moderate amount of beefy-red synovitis.  A synovectomy was performed.  There were large osteophytes along the medial lateral femoral condyle and medial tibial plateau.  These were removed with a rongeur.  Great care was taken to protect the LCL and MCL.  There was complete absence of articular cartilage in the medial femoral condyle.  At that point, we sized a large femoral component.  First, bony cut was made transversing the proximal tibia with a 2 degrees angle of declination.  The plan was to use the revision tibial tray based on the patient's size.  After each bony cut, we checked with the external guide to be sure that we had an excellent alignment.  Subsequent cuts were then made on the femur using the large femoral jig, again LCL and MCL remained intact.  Flexion and extension gaps were perfectly symmetrical at 10 mm. Lamina spreader was then placed along the medial lateral compartments. I remove medial and lateral menisci as well as  ACL and PCL.  Osteophytes were removed from the posterior femoral condyles using curved 3/4 inch osteotome.  There was a Baker cyst noted posterior medially, which was debrided.  Flexion and extension gaps were symmetrical at 10 mm. A 4-degree distal femoral valgus cut was then made followed by the finishing femoral guide using the large femoral jig taping of the condyles were performed as well as the center holes to align the femoral component.  Retractors were then placed around the tibia, #4 tibial tray was measured, and we made the center holes and inserted the revision tibial jig and then made  the keeled cut.  We did check our alignment with the external guide.  A 10-mm bridging bearing was inserted followed by the trial large femoral component.  The entire construct was reduced, we had perfect stability with varus or valgus stress with an anterior drawer sign. There was no malrotation of the femoral or tibial component.  Patella was prepared by removing 12 mm of bone leaving 13 mm patella thickness.  Three holes were then made.  The patella after aligning the patella jig and the trial patella was inserted and tracked perfectly in the midline without instability.  The trial components were then removed.  The joint was copiously irrigated with jet saline solution.  The final components were then impacted with polymethyl methacrylate initially with the revision tibial tray followed by the 10-mm bridging bearing and then the large femoral component.  The knee was placed in full extension and any extraneous methacrylate was removed with a Glorious Peach.  Patella was applied with a patellar clamp and the methacrylate and patellar clamp was applied.  Any further extraneous methacrylate was removed.  Awaiting for the methacrylate to mature, we injected the deep capsule with 0.25% Marcaine with epinephrine. At approximately 16 minutes, methacrylate was hard and mature.  We checked the joint, there was no further extraneous methacrylate.  The wound was irrigated with saline solution.  The tourniquet was deflated. Any gross bleeders were Bovie coagulated.  We had normal capillary bleeding throughout the joint surface.  Thrombin spray was then placed about the joint.  Hemovac was inserted.  The deep capsule was closed with interrupted #1 Ethibond.  Superficial capsule closed with a running 0 Vicryl, subcu with 2-0 Vicryl, skin, and 3-0 Monocryl.  Skin was closed with skin clips.  Sterile bulky dressing was applied followed by the patient's support stocking.  The patient tolerated the  procedure without problems.     Claude Manges. Cleophas Dunker, M.D.     PWW/MEDQ  D:  11/02/2012  T:  11/03/2012  Job:  [id number]

## 2012-11-04 LAB — BASIC METABOLIC PANEL
BUN: 9 mg/dL (ref 6–23)
CO2: 28 mEq/L (ref 19–32)
Calcium: 8.6 mg/dL (ref 8.4–10.5)
Chloride: 101 mEq/L (ref 96–112)
Creatinine, Ser: 0.68 mg/dL (ref 0.50–1.35)
GFR calc Af Amer: >90 mL/min (ref >90)
GFR calc non Af Amer: >90 mL/min (ref >90)
Glucose, Bld: 162 mg/dL — ABNORMAL HIGH (ref 70–99)
Potassium: 4.5 mEq/L (ref 3.5–5.1)
Sodium: 138 mEq/L (ref 135–145)

## 2012-11-04 LAB — CBC
HCT: 38.8 % — ABNORMAL LOW (ref 39.0–52.0)
Hemoglobin: 12.8 g/dL — ABNORMAL LOW (ref 13.0–17.0)
MCH: 29.9 pg (ref 26.0–34.0)
MCHC: 33 g/dL (ref 30.0–36.0)
MCV: 90.7 fL (ref 78.0–100.0)
Platelets: 254 10*3/uL (ref 150–400)
RBC: 4.28 MIL/uL (ref 4.22–5.81)
RDW: 15.8 % — ABNORMAL HIGH (ref 11.5–15.5)
WBC: 11.3 10*3/uL — ABNORMAL HIGH (ref 4.0–10.5)

## 2012-11-04 LAB — PROTIME-INR
INR: 1.23 (ref 0.00–1.49)
Prothrombin Time: 15.2 seconds (ref 11.6–15.2)

## 2012-11-04 MED ORDER — ACETAMINOPHEN 500 MG PO TABS: 1000.0000 mg | ORAL_TABLET | Freq: Four times a day (QID) | ORAL | Status: DC | PRN

## 2012-11-04 MED ORDER — ENOXAPARIN SODIUM 30 MG/0.3ML ~~LOC~~ SOLN: 30.0000 mg | Freq: Two times a day (BID) | SUBCUTANEOUS | Status: DC

## 2012-11-04 MED ORDER — METHOCARBAMOL 500 MG PO TABS: 500.0000 mg | ORAL_TABLET | Freq: Four times a day (QID) | ORAL | Status: DC | PRN

## 2012-11-04 NOTE — Progress Notes (Signed)
Patient ID: Marc Mills, male   DOB: 28-Apr-1946, 67 y.o.   MRN: [id number] PATIENT ID: Marc Mills        MRN:  [id number]          DOB/AGE: [birth year] / 67 y.o.    Norlene Campbell, MD   Jacqualine Code, PA-C [address] [place], [place]  [address]                             [phone]   PROGRESS NOTE  Subjective:  negative for Chest Pain  negative for Shortness of Breath  negative for Nausea/Vomiting   negative for Calf Pain    Tolerating Diet: yes         Patient reports pain as mild.     Tolerating OOB with PT, anxious for D/C  Objective: Vital signs in last 24 hours:   Patient Vitals for the past 24 hrs:  BP Temp Temp src Pulse Resp SpO2  11/04/12 0640 130/63 mmHg 99 F (37.2 C) Oral 79 - 95 %  11/03/12 1600 - - - - 14 99 %  11/03/12 1406 136/86 mmHg 98.4 F (36.9 C) - 84 18 98 %  11/03/12 1200 - - - - 16 98 %      Intake/Output from previous day:   07/02 0701 - 07/03 0700 In: -  Out: 575 [Urine:575]   Intake/Output this shift:       Intake/Output     07/02 0701 - 07/03 0700 07/03 0701 - 07/04 0700   P.O.     I.V. (mL/kg)     Total Intake(mL/kg)     Urine (mL/kg/hr) 575 (0.2)    Drains     Blood     Total Output 575     Net -575             LABORATORY DATA:  Recent Labs  11/02/12 1747 11/03/12 0425 11/04/12 0512  WBC 16.1* 12.4* 11.3*  HGB 14.1 12.8* 12.8*  HCT 42.9 38.3* 38.8*  PLT 341 288 254    Recent Labs  11/02/12 1747 11/03/12 0425 11/04/12 0512  NA  --  138 138  K  --  4.5 4.5  CL  --  102 101  CO2  --  27 28  BUN  --  13 9  CREATININE 0.66 0.62 0.68  GLUCOSE  --  136* 162*  CALCIUM  --  8.4 8.6   Lab Results  Component Value Date   INR 1.23 11/04/2012   INR 1.16 11/03/2012   INR 1.03 11/02/2012   PROTIME 17.0 10/23/2008   PROTIME 14.2 10/03/2008    Recent Radiographic Studies :  Chest 2 View  10/27/2012   *RADIOLOGY REPORT*  Clinical Data: Preoperative radiograph.  Left total knee arthroplasty.  History of  lymphoma.  CHEST - 2 VIEW  Comparison: 12/12/2011 PET CT.  Chest radiograph 12/27/2009.  Findings: Chronic prominence of the right infrahilar lung parenchyma, unchanged from 12/27/2009. The cardiopericardial silhouette is within normal limits.  No airspace disease.  No pleural effusion.  IMPRESSION: No active cardiopulmonary disease or interval change.   Original Report Authenticated By: Andreas Newport, M.D.     Examination:  General appearance: alert, cooperative and no distress  Wound Exam: clean, dry, intact   Drainage:  None: wound tissue dry  Motor Exam: EHL, FHL, Anterior Tibial and Posterior Tibial Intact  Sensory Exam: Superficial Peroneal, Deep Peroneal and Tibial normal  Vascular Exam: Normal  Assessment:    2 Days Post-Op  Procedure(s) (LRB): TOTAL KNEE ARTHROPLASTY with revision tibia (Left)  ADDITIONAL DIAGNOSIS:  Principal Problem:   Osteoarthritis of left knee  no new problems   Plan: Physical Therapy as ordered Partial Weight Bearing @ 50% (PWB)  DVT Prophylaxis:  Lovenox and Coumadin  DISCHARGE PLAN: Home  DISCHARGE NEEDS: HHPT, CPM, Walker and 3-in-1 comode seat    Dressing changed left knee-no drainage, no calf pain-will D/C today     Valeria Batman 11/04/2012 9:33 AM

## 2012-11-04 NOTE — Progress Notes (Signed)
Patient discharged in stable condition via wheelchair to home. Discharge instructions and prescriptions were given and explained. 

## 2012-11-04 NOTE — Progress Notes (Signed)
Physical Therapy Treatment Patient Details Name: Marc Mills MRN: [id number] DOB: [birth year] Today's Date: 11/04/2012 Time: 4098-1191 PT Time Calculation (min): 31 min  PT Assessment / Plan / Recommendation  PT Comments   Pt is moving well with PT. Is safe from mobility standpoint to D/C home with HHPT and family (A) today. Pt is awaiting bariatric 3 in 1 commode. Therapy session focused on stair amb, increasing amb distance and theraex. Anticipate D/C home today when medically stable.   Follow Up Recommendations  Home health PT;Supervision/Assistance - 24 hour     Does the patient have the potential to tolerate intense rehabilitation     Barriers to Discharge        Equipment Recommendations  Other (comment) (awaiting bariatric 3 in 1)    Recommendations for Other Services    Frequency 7X/week   Progress towards PT Goals Progress towards PT goals: Progressing toward goals  Plan Current plan remains appropriate    Precautions / Restrictions Precautions Precautions: Knee Restrictions Weight Bearing Restrictions: Yes LLE Weight Bearing: Partial weight bearing LLE Partial Weight Bearing Percentage or Pounds: 50   Pertinent Vitals/Pain 4/10; c/o "stiffness" RN notified.     Mobility  Bed Mobility Bed Mobility: Sit to Supine Supine to Sit: 4: Min assist;HOB elevated;With rails Details for Bed Mobility Assistance: (A) to advance L LE to EOB due to "stiffness". Pt has difficulty manuevering on mattress.  Transfers Transfers: Sit to Stand;Stand to Sit Sit to Stand: 5: Supervision;From bed;From elevated surface Stand to Sit: 5: Supervision;To chair/3-in-1;With armrests Details for Transfer Assistance: pt requires min cues for safety; attempts to flex knee with sitting; limited today with knee flexion due to stiffness; min cues to maintain PWB status   Ambulation/Gait Ambulation/Gait Assistance: 5: Supervision Ambulation Distance (Feet): 120 Feet Assistive device: Rolling  walker Ambulation/Gait Assistance Details: verbal cues for gt sequencing, to increase L knee flexion and heel strike; pt relies on UEs heavily to maintain PWB status on L LE  Gait Pattern: Step-to pattern;Decreased stance time - left;Decreased step length - right;Decreased hip/knee flexion - left;Trunk flexed Gait velocity: decreased due to pain  Stairs: Yes Stairs Assistance: 4: Min guard Stairs Assistance Details (indicate cue type and reason): verbal cues for proper gt sequencing; requiresmin guard for safety to steady   Stair Management Technique: One rail Right;With cane;Step to pattern;Forwards Number of Stairs: 3 Wheelchair Mobility Wheelchair Mobility: No    Exercises Total Joint Exercises Ankle Circles/Pumps: AROM;Both;10 reps;Seated Heel Slides: AROM;Left;10 reps;Seated Long Arc Quad: AROM;Left;10 reps;Seated Knee Flexion: Standing;10 reps;Left Goniometric ROM: AROM knee flex limited to 60 degrees today due to pain    PT Diagnosis:    PT Problem List:   PT Treatment Interventions:     PT Goals (current goals can now be found in the care plan section) Acute Rehab PT Goals Patient Stated Goal: home TODAY! PT Goal Formulation: With patient Time For Goal Achievement: 11/10/12 Potential to Achieve Goals: Good  Visit Information  Last PT Received On: 11/04/12 Assistance Needed: +1 History of Present Illness: s/p L TKA elective    Subjective Data  Subjective: pt lying supine; eager to walk and practice steps to go home. states " i am sore and stiff from not getting up all night, i need to move" Patient Stated Goal: home TODAY!   Cognition  Cognition Arousal/Alertness: Awake/alert Behavior During Therapy: WFL for tasks assessed/performed Overall Cognitive Status: Within Functional Limits for tasks assessed    Balance  Balance Balance Assessed: No  End of Session PT - End of Session Equipment Utilized During Treatment: Gait belt Activity Tolerance: Patient tolerated  treatment well Patient left: in chair;with call bell/phone within reach Nurse Communication: Mobility status;Patient requests pain meds CPM Left Knee CPM Left Knee: [address]   GP     Donnamarie Poag [place], [place] [phone] 11/04/2012, 9:18 AM

## 2012-11-04 NOTE — Discharge Summary (Signed)
Marc Campbell, MD   Marc Code, PA-C [address] [place], [place]  [address]                             [phone]  PATIENT ID: Marc Mills        MRN:  [id number]          DOB/AGE: 10/01/1945 / 67 y.o.    DISCHARGE SUMMARY  ADMISSION DATE:    11/02/2012 DISCHARGE DATE:   11/04/2012   ADMISSION DIAGNOSIS: Left Knee Osteoarthritis    DISCHARGE DIAGNOSIS:  Left Knee Osteoarthritis    ADDITIONAL DIAGNOSIS: Principal Problem:   Osteoarthritis of left knee  Past Medical History  Diagnosis Date  . Morbid obesity   . History of pulmonary embolus (PE)   . History of lymphoma   . Incisional hernia   . Arthritis   . Blood transfusion without reported diagnosis   . DVT (deep venous thrombosis)     2011 right leg  . Anorectal fistula   . OSA on CPAP     cpap  10 yrs  . Varicose veins of lower extremities     pe  . Cancer     lymphoma hx    PROCEDURE: Procedure(s): TOTAL KNEE ARTHROPLASTY with revision tibia Left on 11/02/2012  CONSULTS: none     HISTORY: Marc Mills is a very pleasant 67 year old white, married male who is seen today for a several year history of left knee pain. He has had injuries in the past with the knees and actually was seen in October 2012. He had fallen out of a hot tub at the [employer] in [facility] and sustained an injury when he hit the pool going up the steps. He has had multiple injections to the knee, corticosteroids as well as Hyalgan injections. He has had intermittent relief with all of these, however, now the injections do not seem to be as beneficial to him as they were previously. He did have cortisone injections in the left knee to get him through his trip to [place] and [place] and most recently had another cortisone injection to get him through his vacation earlier this month. He continues to have increasing pain and discomfort in the knee with nighttime pain as well as getting to the point of pain with every step. He has  difficulty with doing a lot of walking. He does a lot of swimming, which has actually been very helpful for him and keeping his knee strong. He has had again multiple viscosupplementation of Hyalgan and cortisone.    HOSPITAL COURSE:  Marc Mills is a 67 y.o. admitted on 11/02/2012 and found to have a diagnosis of Left Knee Osteoarthritis.  After appropriate laboratory studies were obtained  they were taken to the operating room on 11/02/2012 and underwent  Procedure(s): TOTAL KNEE ARTHROPLASTY with revision tibia  Left.   They were given perioperative antibiotics:  Anti-infectives   Start     Dose/Rate Route Frequency Ordered Stop   11/02/12 1800  ceFAZolin (ANCEF) IVPB 2 g/50 mL premix     2 g 100 mL/hr over 30 Minutes Intravenous Every 6 hours 11/02/12 1619 11/03/12 0009   11/02/12 0600  ceFAZolin (ANCEF) 3 g in dextrose 5 % 50 mL IVPB     3 g 160 mL/hr over 30 Minutes Intravenous On call to O.R. 11/01/12 1426 11/02/12 1015    .  Tolerated the procedure well.  Placed with  a foley intraoperatively.     Toradol was given post op.  Tylenol 1000 mg po at induction.  POD #1, allowed out of bed to a chair.  PT for ambulation and exercise program.  Foley D/C'd in morning.  IV saline locked.  O2 discontinued.   Hemovac pulled.   POD #2, continued PT and ambulation.  Desired discharge.  The remainder of the hospital course was dedicated to ambulation and strengthening.   The patient was discharged on 2 Days Post-Op in  Stable condition.  Blood products given:none  DIAGNOSTIC STUDIES: Recent vital signs:  Patient Vitals for the past 24 hrs:  BP Temp Temp src Pulse Resp SpO2  11/04/12 0640 130/63 mmHg 99 F (37.2 C) Oral 79 - 95 %  11/03/12 1600 - - - - 14 99 %  11/03/12 1406 136/86 mmHg 98.4 F (36.9 C) - 84 18 98 %  11/03/12 1200 - - - - 16 98 %       Recent laboratory studies:  Recent Labs  11/02/12 1747 11/03/12 0425 11/04/12 0512  WBC 16.1* 12.4* 11.3*  HGB 14.1 12.8*  12.8*  HCT 42.9 38.3* 38.8*  PLT 341 288 254    Recent Labs  11/02/12 1747 11/03/12 0425 11/04/12 0512  NA  --  138 138  K  --  4.5 4.5  CL  --  102 101  CO2  --  27 28  BUN  --  13 9  CREATININE 0.66 0.62 0.68  GLUCOSE  --  136* 162*  CALCIUM  --  8.4 8.6   Lab Results  Component Value Date   INR 1.23 11/04/2012   INR 1.16 11/03/2012   INR 1.03 11/02/2012   PROTIME 17.0 10/23/2008   PROTIME 14.2 10/03/2008     Recent Radiographic Studies :  Chest 2 View  10/27/2012   *RADIOLOGY REPORT*  Clinical Data: Preoperative radiograph.  Left total knee arthroplasty.  History of lymphoma.  CHEST - 2 VIEW  Comparison: 12/12/2011 PET CT.  Chest radiograph 12/27/2009.  Findings: Chronic prominence of the right infrahilar lung parenchyma, unchanged from 12/27/2009. The cardiopericardial silhouette is within normal limits.  No airspace disease.  No pleural effusion.  IMPRESSION: No active cardiopulmonary disease or interval change.   Original Report Authenticated By: Andreas Newport, M.D.    DISCHARGE INSTRUCTIONS:     Discharge Orders   Future Appointments Provider Department Dept Phone   11/15/2012 8:45 AM [facility] [phone]   12/03/2012 8:00 AM [facility] [phone]   12/03/2012 8:30 AM [facility] [phone]   Pt should arrive15 minutes prior to scheduled appt time. Please inform patient that exam will take a minimum of 1 1/2 hours. Patient to be NPO 6 hours prior to exam  and should not take any insulin the day of exam.   12/06/2012 11:00 AM Lowella Dell, MD [facility] MEDICAL ONCOLOGY [phone]   10/18/2013 9:00 AM Waymon Budge, MD [facility] [phone]   Future Orders Complete By Expires     CPM  As directed     Comments:      Continuous passive motion machine (CPM):      Use the CPM from 0 to 60 for  6-8 hours per day.      You may increase by 5-10 per day.  You may break it up into 2 or 3 sessions  per day.      Use CPM for 3-4 weeks or until you are told to stop.    Call MD / Call 911  As directed     Comments:      If you experience chest pain or shortness of breath, CALL 911 and be transported to the hospital emergency room.  If you develope a fever above 101 F, pus (white drainage) or increased drainage or redness at the wound, or calf pain, call your surgeon's office.    Change dressing  As directed     Comments:      Change dressing on SUNDAY, then change the dressing daily with sterile 4 x 4 inch gauze dressing and apply TED hose.  You may clean the incision with alcohol prior to redressing.    Constipation Prevention  As directed     Comments:      Drink plenty of fluids.  Prune juice and/or coffee may be helpful.  You may use a stool softener, such as Colace (over the counter) 100 mg twice a day.  Use MiraLax (over the counter) for constipation as needed but this may take several days to work.  Mag Citrate --OR-- Milk of Magnesia may also be used but follow directions on the label.    Diet - low sodium heart healthy  As directed     Do not put a pillow under the knee. Place it under the heel.  As directed     Driving restrictions  As directed     Comments:      No driving for 6 weeks    Increase activity slowly as tolerated  As directed     Lifting restrictions  As directed     Comments:      No lifting for 6 weeks    Partial weight bearing  As directed     Comments:      50  % WEIGHT BEARING AS TAUGHT IN PHYSICAL THERAPY    Patient may shower  As directed     Comments:      You may shower over the brown dressing.  Once the dressing is removed you may shower without a dressing once there is no drainage.  Do not wash over the wound.  If drainage remains, cover wound with plastic wrap and then shower.    TED hose  As directed     Comments:      Use stockings (TED hose) for 2  weeks on operative leg(s).  You may remove them at night for sleeping.       DISCHARGE MEDICATIONS:     Medication List    STOP taking these medications       Cinnamon 500 MG Tabs     glucosamine-chondroitin 500-400 MG tablet     sildenafil 100 MG tablet  Commonly known as:  VIAGRA      TAKE these medications       acetaminophen 500 MG tablet  Commonly known as:  TYLENOL  Take 2 tablets (1,000 mg total) by mouth every 6 (six) hours as needed for pain. For pain     b complex vitamins tablet  Take 1 tablet by mouth at bedtime.     calcium carbonate 600 MG Tabs  Commonly known as:  OS-CAL  Take 600 mg by mouth at bedtime.     cholecalciferol 1000 UNITS tablet  Commonly known as:  VITAMIN D  Take 1,000 Units by mouth at bedtime.  citalopram 20 MG tablet  Commonly known as:  CELEXA  Take 1 tablet (20 mg total) by mouth daily. Needs office visit     enoxaparin 30 MG/0.3ML injection  Commonly known as:  LOVENOX  Inject 0.3 mLs (30 mg total) into the skin every 12 (twelve) hours.     methocarbamol 500 MG tablet  Commonly known as:  ROBAXIN  Take 1 tablet (500 mg total) by mouth every 6 (six) hours as needed.     multivitamin tablet  Take 1 tablet by mouth at bedtime.     warfarin 5 MG tablet  Commonly known as:  COUMADIN  Take 2.5-5 mg by mouth daily at 6 PM. Take 5mg  on Mon/Tues/Wed/Fri/Sat...then take 2.5mg  on Thur/Sun.        FOLLOW UP VISIT:   Follow-up Information   Follow up with Kiing Deakin, PA-C. Schedule an appointment as soon as possible for a visit on 11/17/2012.   Contact information:   [address] [place] [address] [phone]       DISPOSITION:   Home  CONDITION:  Stable   Maghan Jessee 11/04/2012, 10:28 AM

## 2012-11-06 DIAGNOSIS — Z4801 Encounter for change or removal of surgical wound dressing: Secondary | ICD-10-CM | POA: Diagnosis not present

## 2012-11-06 DIAGNOSIS — Z5181 Encounter for therapeutic drug level monitoring: Secondary | ICD-10-CM | POA: Diagnosis not present

## 2012-11-06 DIAGNOSIS — M1710 Unilateral primary osteoarthritis, unspecified knee: Secondary | ICD-10-CM | POA: Diagnosis not present

## 2012-11-06 DIAGNOSIS — Z471 Aftercare following joint replacement surgery: Secondary | ICD-10-CM | POA: Diagnosis not present

## 2012-11-06 DIAGNOSIS — Z96659 Presence of unspecified artificial knee joint: Secondary | ICD-10-CM | POA: Diagnosis not present

## 2012-11-09 ENCOUNTER — Ambulatory Visit (INDEPENDENT_AMBULATORY_CARE_PROVIDER_SITE_OTHER): Payer: Medicare Other | Admitting: Cardiology

## 2012-11-09 DIAGNOSIS — Z471 Aftercare following joint replacement surgery: Secondary | ICD-10-CM | POA: Diagnosis not present

## 2012-11-09 DIAGNOSIS — Z96659 Presence of unspecified artificial knee joint: Secondary | ICD-10-CM | POA: Diagnosis not present

## 2012-11-09 DIAGNOSIS — Z4801 Encounter for change or removal of surgical wound dressing: Secondary | ICD-10-CM | POA: Diagnosis not present

## 2012-11-09 DIAGNOSIS — Z5181 Encounter for therapeutic drug level monitoring: Secondary | ICD-10-CM | POA: Diagnosis not present

## 2012-11-09 LAB — POCT INR: INR: 1.7

## 2012-11-10 DIAGNOSIS — Z4801 Encounter for change or removal of surgical wound dressing: Secondary | ICD-10-CM | POA: Diagnosis not present

## 2012-11-10 DIAGNOSIS — Z5181 Encounter for therapeutic drug level monitoring: Secondary | ICD-10-CM | POA: Diagnosis not present

## 2012-11-10 DIAGNOSIS — Z471 Aftercare following joint replacement surgery: Secondary | ICD-10-CM | POA: Diagnosis not present

## 2012-11-10 DIAGNOSIS — Z96659 Presence of unspecified artificial knee joint: Secondary | ICD-10-CM | POA: Diagnosis not present

## 2012-11-11 DIAGNOSIS — Z4801 Encounter for change or removal of surgical wound dressing: Secondary | ICD-10-CM | POA: Diagnosis not present

## 2012-11-11 DIAGNOSIS — Z471 Aftercare following joint replacement surgery: Secondary | ICD-10-CM | POA: Diagnosis not present

## 2012-11-11 DIAGNOSIS — Z96659 Presence of unspecified artificial knee joint: Secondary | ICD-10-CM | POA: Diagnosis not present

## 2012-11-11 DIAGNOSIS — Z5181 Encounter for therapeutic drug level monitoring: Secondary | ICD-10-CM | POA: Diagnosis not present

## 2012-11-12 ENCOUNTER — Ambulatory Visit (INDEPENDENT_AMBULATORY_CARE_PROVIDER_SITE_OTHER): Payer: Medicare Other | Admitting: Cardiovascular Disease

## 2012-11-12 DIAGNOSIS — Z4801 Encounter for change or removal of surgical wound dressing: Secondary | ICD-10-CM | POA: Diagnosis not present

## 2012-11-12 DIAGNOSIS — Z471 Aftercare following joint replacement surgery: Secondary | ICD-10-CM | POA: Diagnosis not present

## 2012-11-12 DIAGNOSIS — Z5181 Encounter for therapeutic drug level monitoring: Secondary | ICD-10-CM | POA: Diagnosis not present

## 2012-11-12 DIAGNOSIS — Z96659 Presence of unspecified artificial knee joint: Secondary | ICD-10-CM | POA: Diagnosis not present

## 2012-11-12 LAB — POCT INR: INR: 2.2

## 2012-11-15 ENCOUNTER — Other Ambulatory Visit: Payer: Self-pay | Admitting: Emergency Medicine

## 2012-11-15 DIAGNOSIS — Z96649 Presence of unspecified artificial hip joint: Secondary | ICD-10-CM | POA: Diagnosis not present

## 2012-11-15 DIAGNOSIS — Z5181 Encounter for therapeutic drug level monitoring: Secondary | ICD-10-CM | POA: Diagnosis not present

## 2012-11-15 DIAGNOSIS — Z4801 Encounter for change or removal of surgical wound dressing: Secondary | ICD-10-CM | POA: Diagnosis not present

## 2012-11-15 DIAGNOSIS — Z96659 Presence of unspecified artificial knee joint: Secondary | ICD-10-CM | POA: Diagnosis not present

## 2012-11-15 DIAGNOSIS — Z471 Aftercare following joint replacement surgery: Secondary | ICD-10-CM | POA: Diagnosis not present

## 2012-11-15 NOTE — Telephone Encounter (Signed)
Do you want to send this to Dr Antoine Poche or refill?

## 2012-11-16 DIAGNOSIS — Z471 Aftercare following joint replacement surgery: Secondary | ICD-10-CM | POA: Diagnosis not present

## 2012-11-16 DIAGNOSIS — Z5181 Encounter for therapeutic drug level monitoring: Secondary | ICD-10-CM | POA: Diagnosis not present

## 2012-11-16 DIAGNOSIS — Z4801 Encounter for change or removal of surgical wound dressing: Secondary | ICD-10-CM | POA: Diagnosis not present

## 2012-11-16 DIAGNOSIS — Z96659 Presence of unspecified artificial knee joint: Secondary | ICD-10-CM | POA: Diagnosis not present

## 2012-11-17 DIAGNOSIS — Z96659 Presence of unspecified artificial knee joint: Secondary | ICD-10-CM | POA: Diagnosis not present

## 2012-11-17 DIAGNOSIS — Z5181 Encounter for therapeutic drug level monitoring: Secondary | ICD-10-CM | POA: Diagnosis not present

## 2012-11-17 DIAGNOSIS — Z471 Aftercare following joint replacement surgery: Secondary | ICD-10-CM | POA: Diagnosis not present

## 2012-11-17 DIAGNOSIS — Z4801 Encounter for change or removal of surgical wound dressing: Secondary | ICD-10-CM | POA: Diagnosis not present

## 2012-11-18 DIAGNOSIS — Z5181 Encounter for therapeutic drug level monitoring: Secondary | ICD-10-CM | POA: Diagnosis not present

## 2012-11-18 DIAGNOSIS — Z96659 Presence of unspecified artificial knee joint: Secondary | ICD-10-CM | POA: Diagnosis not present

## 2012-11-18 DIAGNOSIS — Z4801 Encounter for change or removal of surgical wound dressing: Secondary | ICD-10-CM | POA: Diagnosis not present

## 2012-11-18 DIAGNOSIS — Z471 Aftercare following joint replacement surgery: Secondary | ICD-10-CM | POA: Diagnosis not present

## 2012-11-19 ENCOUNTER — Ambulatory Visit (INDEPENDENT_AMBULATORY_CARE_PROVIDER_SITE_OTHER): Payer: Medicare Other | Admitting: Cardiology

## 2012-11-19 DIAGNOSIS — Z96659 Presence of unspecified artificial knee joint: Secondary | ICD-10-CM | POA: Diagnosis not present

## 2012-11-19 DIAGNOSIS — Z4801 Encounter for change or removal of surgical wound dressing: Secondary | ICD-10-CM | POA: Diagnosis not present

## 2012-11-19 DIAGNOSIS — Z471 Aftercare following joint replacement surgery: Secondary | ICD-10-CM | POA: Diagnosis not present

## 2012-11-19 DIAGNOSIS — Z5181 Encounter for therapeutic drug level monitoring: Secondary | ICD-10-CM | POA: Diagnosis not present

## 2012-11-19 LAB — POCT INR: INR: 2.2

## 2012-11-23 DIAGNOSIS — Z96659 Presence of unspecified artificial knee joint: Secondary | ICD-10-CM | POA: Diagnosis not present

## 2012-11-23 DIAGNOSIS — Z5181 Encounter for therapeutic drug level monitoring: Secondary | ICD-10-CM | POA: Diagnosis not present

## 2012-11-23 DIAGNOSIS — Z4801 Encounter for change or removal of surgical wound dressing: Secondary | ICD-10-CM | POA: Diagnosis not present

## 2012-11-23 DIAGNOSIS — Z471 Aftercare following joint replacement surgery: Secondary | ICD-10-CM | POA: Diagnosis not present

## 2012-11-24 ENCOUNTER — Other Ambulatory Visit: Payer: Self-pay | Admitting: Physician Assistant

## 2012-11-25 DIAGNOSIS — Z5181 Encounter for therapeutic drug level monitoring: Secondary | ICD-10-CM | POA: Diagnosis not present

## 2012-11-25 DIAGNOSIS — Z4801 Encounter for change or removal of surgical wound dressing: Secondary | ICD-10-CM | POA: Diagnosis not present

## 2012-11-25 DIAGNOSIS — Z471 Aftercare following joint replacement surgery: Secondary | ICD-10-CM | POA: Diagnosis not present

## 2012-11-25 DIAGNOSIS — Z96659 Presence of unspecified artificial knee joint: Secondary | ICD-10-CM | POA: Diagnosis not present

## 2012-11-26 DIAGNOSIS — Z471 Aftercare following joint replacement surgery: Secondary | ICD-10-CM | POA: Diagnosis not present

## 2012-11-26 DIAGNOSIS — Z4801 Encounter for change or removal of surgical wound dressing: Secondary | ICD-10-CM | POA: Diagnosis not present

## 2012-11-26 DIAGNOSIS — Z96659 Presence of unspecified artificial knee joint: Secondary | ICD-10-CM | POA: Diagnosis not present

## 2012-11-26 DIAGNOSIS — Z5181 Encounter for therapeutic drug level monitoring: Secondary | ICD-10-CM | POA: Diagnosis not present

## 2012-12-02 ENCOUNTER — Ambulatory Visit (INDEPENDENT_AMBULATORY_CARE_PROVIDER_SITE_OTHER): Payer: Medicare Other | Admitting: *Deleted

## 2012-12-02 DIAGNOSIS — Z792 Long term (current) use of antibiotics: Secondary | ICD-10-CM | POA: Diagnosis not present

## 2012-12-02 DIAGNOSIS — Z86718 Personal history of other venous thrombosis and embolism: Secondary | ICD-10-CM

## 2012-12-02 LAB — POCT INR: INR: 2.4

## 2012-12-03 ENCOUNTER — Encounter (HOSPITAL_COMMUNITY)
Admission: RE | Admit: 2012-12-03 | Discharge: 2012-12-03 | Disposition: A | Discharge status: Home / Self Care | Payer: Medicare Other | Source: Ambulatory Visit | Attending: Oncology | Admitting: Oncology

## 2012-12-03 ENCOUNTER — Other Ambulatory Visit (HOSPITAL_BASED_OUTPATIENT_CLINIC_OR_DEPARTMENT_OTHER): Payer: Medicare Other | Admitting: Lab

## 2012-12-03 DIAGNOSIS — C8589 Other specified types of non-Hodgkin lymphoma, extranodal and solid organ sites: Secondary | ICD-10-CM | POA: Diagnosis not present

## 2012-12-03 DIAGNOSIS — Z87898 Personal history of other specified conditions: Secondary | ICD-10-CM

## 2012-12-03 DIAGNOSIS — Z86718 Personal history of other venous thrombosis and embolism: Secondary | ICD-10-CM

## 2012-12-03 LAB — CBC WITH DIFFERENTIAL/PLATELET
BASO%: 1.3 % (ref 0.0–2.0)
Basophils Absolute: 0.1 10*3/uL (ref 0.0–0.1)
EOS%: 6.7 % (ref 0.0–7.0)
Eosinophils Absolute: 0.4 10*3/uL (ref 0.0–0.5)
HCT: 40 % (ref 38.4–49.9)
HGB: 13.3 g/dL (ref 13.0–17.1)
LYMPH%: 12.5 % — ABNORMAL LOW (ref 14.0–49.0)
MCH: 29.8 pg (ref 27.2–33.4)
MCHC: 33.1 g/dL (ref 32.0–36.0)
MCV: 89.9 fL (ref 79.3–98.0)
MONO#: 1 10*3/uL — ABNORMAL HIGH (ref 0.1–0.9)
MONO%: 15.5 % — ABNORMAL HIGH (ref 0.0–14.0)
NEUT#: 4.3 10*3/uL (ref 1.5–6.5)
NEUT%: 64 % (ref 39.0–75.0)
Platelets: 300 10*3/uL (ref 140–400)
RBC: 4.45 10*6/uL (ref 4.20–5.82)
RDW: 14 % (ref 11.0–14.6)
WBC: 6.7 10*3/uL (ref 4.0–10.3)
lymph#: 0.8 10*3/uL — ABNORMAL LOW (ref 0.9–3.3)

## 2012-12-03 LAB — COMPREHENSIVE METABOLIC PANEL (CC13)
ALT: 17 U/L (ref 0–55)
AST: 18 U/L (ref 5–34)
Albumin: 3.5 g/dL (ref 3.5–5.0)
Alkaline Phosphatase: 80 U/L (ref 40–150)
BUN: 14.7 mg/dL (ref 7.0–26.0)
CO2: 26 mEq/L (ref 22–29)
Calcium: 9.5 mg/dL (ref 8.4–10.4)
Chloride: 105 mEq/L (ref 98–109)
Creatinine: 0.7 mg/dL (ref 0.7–1.3)
Glucose: 95 mg/dl (ref 70–140)
Potassium: 4.5 mEq/L (ref 3.5–5.1)
Sodium: 141 mEq/L (ref 136–145)
Total Bilirubin: 0.23 mg/dL (ref 0.20–1.20)
Total Protein: 6.9 g/dL (ref 6.4–8.3)

## 2012-12-03 LAB — GLUCOSE, CAPILLARY: Glucose-Capillary: 99 mg/dL (ref 70–99)

## 2012-12-03 LAB — URIC ACID (CC13): Uric Acid, Serum: 4.8 mg/dl (ref 2.6–7.4)

## 2012-12-03 LAB — LACTATE DEHYDROGENASE (CC13): LDH: 122 U/L — ABNORMAL LOW (ref 125–245)

## 2012-12-03 MED ORDER — FLUDEOXYGLUCOSE F - 18 (FDG) INJECTION
17.8000 | Freq: Once | INTRAVENOUS | Status: AC | PRN
  Administered 2012-12-03: 17.8 via INTRAVENOUS

## 2012-12-05 NOTE — Patient Instructions (Signed)
Smoking Cessation Quitting smoking is important to your health and has many advantages. However, it is not always easy to quit since nicotine is a very addictive drug. Often times, people try 3 times or more before being able to quit. This document explains the best ways for you to prepare to quit smoking. Quitting takes hard work and a lot of effort, but you can do it. ADVANTAGES OF QUITTING SMOKING  You will live longer, feel better, and live better.  Your body will feel the impact of quitting smoking almost immediately.  Within 20 minutes, blood pressure decreases. Your pulse returns to its normal level.  After 8 hours, carbon monoxide levels in the blood return to normal. Your oxygen level increases.  After 24 hours, the chance of having a heart attack starts to decrease. Your breath, hair, and body stop smelling like smoke.  After 48 hours, damaged nerve endings begin to recover. Your sense of taste and smell improve.  After 72 hours, the body is virtually free of nicotine. Your bronchial tubes relax and breathing becomes easier.  After 2 to 12 weeks, lungs can hold more air. Exercise becomes easier and circulation improves.  The risk of having a heart attack, stroke, cancer, or lung disease is greatly reduced.  After 1 year, the risk of coronary heart disease is cut in half.  After 5 years, the risk of stroke falls to the same as a nonsmoker.  After 10 years, the risk of lung cancer is cut in half and the risk of other cancers decreases significantly.  After 15 years, the risk of coronary heart disease drops, usually to the level of a nonsmoker.  If you are pregnant, quitting smoking will improve your chances of having a healthy baby.  The people you live with, especially any children, will be healthier.  You will have extra money to spend on things other than cigarettes. QUESTIONS TO THINK ABOUT BEFORE ATTEMPTING TO QUIT You may want to talk about your answers with your  caregiver.  Why do you want to quit?  If you tried to quit in the past, what helped and what did not?  What will be the most difficult situations for you after you quit? How will you plan to handle them?  Who can help you through the tough times? Your family? Friends? A caregiver?  What pleasures do you get from smoking? What ways can you still get pleasure if you quit? Here are some questions to ask your caregiver:  How can you help me to be successful at quitting?  What medicine do you think would be best for me and how should I take it?  What should I do if I need more help?  What is smoking withdrawal like? How can I get information on withdrawal? GET READY  Set a quit date.  Change your environment by getting rid of all cigarettes, ashtrays, matches, and lighters in your home, car, or work. Do not let people smoke in your home.  Review your past attempts to quit. Think about what worked and what did not. GET SUPPORT AND ENCOURAGEMENT You have a better chance of being successful if you have help. You can get support in many ways.  Tell your family, friends, and co-workers that you are going to quit and need their support. Ask them not to smoke around you.  Get individual, group, or telephone counseling and support. Programs are available at local hospitals and health centers. Call your local health department for   information about programs in your area.  Spiritual beliefs and practices may help some smokers quit.  Download a "quit meter" on your computer to keep track of quit statistics, such as how long you have gone without smoking, cigarettes not smoked, and money saved.  Get a self-help book about quitting smoking and staying off of tobacco. LEARN NEW SKILLS AND BEHAVIORS  Distract yourself from urges to smoke. Talk to someone, go for a walk, or occupy your time with a task.  Change your normal routine. Take a different route to work. Drink tea instead of coffee.  Eat breakfast in a different place.  Reduce your stress. Take a hot bath, exercise, or read a book.  Plan something enjoyable to do every day. Reward yourself for not smoking.  Explore interactive web-based programs that specialize in helping you quit. GET MEDICINE AND USE IT CORRECTLY Medicines can help you stop smoking and decrease the urge to smoke. Combining medicine with the above behavioral methods and support can greatly increase your chances of successfully quitting smoking.  Nicotine replacement therapy helps deliver nicotine to your body without the negative effects and risks of smoking. Nicotine replacement therapy includes nicotine gum, lozenges, inhalers, nasal sprays, and skin patches. Some may be available over-the-counter and others require a prescription.  Antidepressant medicine helps people abstain from smoking, but how this works is unknown. This medicine is available by prescription.  Nicotinic receptor partial agonist medicine simulates the effect of nicotine in your brain. This medicine is available by prescription. Ask your caregiver for advice about which medicines to use and how to use them based on your health history. Your caregiver will tell you what side effects to look out for if you choose to be on a medicine or therapy. Carefully read the information on the package. Do not use any other product containing nicotine while using a nicotine replacement product.  RELAPSE OR DIFFICULT SITUATIONS Most relapses occur within the first 3 months after quitting. Do not be discouraged if you start smoking again. Remember, most people try several times before finally quitting. You may have symptoms of withdrawal because your body is used to nicotine. You may crave cigarettes, be irritable, feel very hungry, cough often, get headaches, or have difficulty concentrating. The withdrawal symptoms are only temporary. They are strongest when you first quit, but they will go away within  10 14 days. To reduce the chances of relapse, try to:  Avoid drinking alcohol. Drinking lowers your chances of successfully quitting.  Reduce the amount of caffeine you consume. Once you quit smoking, the amount of caffeine in your body increases and can give you symptoms, such as a rapid heartbeat, sweating, and anxiety.  Avoid smokers because they can make you want to smoke.  Do not let weight gain distract you. Many smokers will gain weight when they quit, usually less than 10 pounds. Eat a healthy diet and stay active. You can always lose the weight gained after you quit.  Find ways to improve your mood other than smoking. FOR MORE INFORMATION  www.smokefree.gov  Document Released: 04/15/2001 Document Revised: 10/21/2011 Document Reviewed: 07/31/2011 [employer] Patient Information 2014 [employer].  

## 2012-12-06 ENCOUNTER — Telehealth: Payer: Self-pay | Admitting: Oncology

## 2012-12-06 ENCOUNTER — Ambulatory Visit (HOSPITAL_BASED_OUTPATIENT_CLINIC_OR_DEPARTMENT_OTHER): Payer: Medicare Other | Admitting: Oncology

## 2012-12-06 VITALS — BP 133/82 | HR 72 | Temp 97.9°F | Resp 20 | Ht 72.0 in | Wt 337.2 lb

## 2012-12-06 DIAGNOSIS — C8587 Other specified types of non-Hodgkin lymphoma, spleen: Secondary | ICD-10-CM

## 2012-12-06 DIAGNOSIS — Z87898 Personal history of other specified conditions: Secondary | ICD-10-CM

## 2012-12-06 DIAGNOSIS — Z7901 Long term (current) use of anticoagulants: Secondary | ICD-10-CM | POA: Diagnosis not present

## 2012-12-06 DIAGNOSIS — Z86711 Personal history of pulmonary embolism: Secondary | ICD-10-CM

## 2012-12-06 LAB — BETA 2 MICROGLOBULIN, SERUM: Beta-2 Microglobulin: 2.07 mg/L — ABNORMAL HIGH (ref 1.01–1.73)

## 2012-12-06 MED ORDER — CELECOXIB 100 MG PO CAPS: 100.0000 mg | ORAL_CAPSULE | Freq: Two times a day (BID) | ORAL | Status: DC | PRN
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
## 2012-12-08 ENCOUNTER — Other Ambulatory Visit: Payer: Self-pay

## 2012-12-09 ENCOUNTER — Ambulatory Visit: Payer: Medicare Other | Admitting: Oncology

## 2012-12-13 ENCOUNTER — Ambulatory Visit: Payer: Medicare Other | Admitting: Oncology

## 2012-12-30 ENCOUNTER — Ambulatory Visit (INDEPENDENT_AMBULATORY_CARE_PROVIDER_SITE_OTHER): Payer: Medicare Other | Admitting: *Deleted

## 2012-12-30 DIAGNOSIS — Z86718 Personal history of other venous thrombosis and embolism: Secondary | ICD-10-CM | POA: Diagnosis not present

## 2012-12-30 DIAGNOSIS — Z792 Long term (current) use of antibiotics: Secondary | ICD-10-CM | POA: Diagnosis not present

## 2012-12-30 LAB — POCT INR: INR: 3.8

## 2013-01-17 ENCOUNTER — Ambulatory Visit (INDEPENDENT_AMBULATORY_CARE_PROVIDER_SITE_OTHER): Payer: Medicare Other | Admitting: *Deleted

## 2013-01-17 DIAGNOSIS — Z792 Long term (current) use of antibiotics: Secondary | ICD-10-CM | POA: Diagnosis not present

## 2013-01-17 DIAGNOSIS — Z86718 Personal history of other venous thrombosis and embolism: Secondary | ICD-10-CM

## 2013-01-17 LAB — POCT INR: INR: 2.7

## 2013-02-14 ENCOUNTER — Ambulatory Visit (INDEPENDENT_AMBULATORY_CARE_PROVIDER_SITE_OTHER): Payer: Medicare Other | Admitting: *Deleted

## 2013-02-14 DIAGNOSIS — Z792 Long term (current) use of antibiotics: Secondary | ICD-10-CM | POA: Diagnosis not present

## 2013-02-14 DIAGNOSIS — Z86718 Personal history of other venous thrombosis and embolism: Secondary | ICD-10-CM

## 2013-02-14 LAB — POCT INR: INR: 2.1

## 2013-03-10 ENCOUNTER — Other Ambulatory Visit: Payer: Self-pay

## 2013-03-14 ENCOUNTER — Ambulatory Visit (INDEPENDENT_AMBULATORY_CARE_PROVIDER_SITE_OTHER): Payer: Medicare Other | Admitting: General Practice

## 2013-03-14 DIAGNOSIS — Z792 Long term (current) use of antibiotics: Secondary | ICD-10-CM | POA: Diagnosis not present

## 2013-03-14 DIAGNOSIS — Z86718 Personal history of other venous thrombosis and embolism: Secondary | ICD-10-CM | POA: Diagnosis not present

## 2013-03-14 LAB — POCT INR: INR: 3.5

## 2013-03-21 ENCOUNTER — Encounter: Payer: Self-pay | Admitting: Emergency Medicine

## 2013-03-21 ENCOUNTER — Ambulatory Visit (INDEPENDENT_AMBULATORY_CARE_PROVIDER_SITE_OTHER): Payer: Medicare Other | Admitting: Emergency Medicine

## 2013-03-21 VITALS — BP 137/76 | HR 68 | Temp 98.3°F | Resp 18 | Ht 70.5 in | Wt 345.2 lb

## 2013-03-21 DIAGNOSIS — Z0000 Encounter for general adult medical examination without abnormal findings: Secondary | ICD-10-CM

## 2013-03-21 DIAGNOSIS — Z125 Encounter for screening for malignant neoplasm of prostate: Secondary | ICD-10-CM | POA: Diagnosis not present

## 2013-03-21 DIAGNOSIS — N529 Male erectile dysfunction, unspecified: Secondary | ICD-10-CM

## 2013-03-21 DIAGNOSIS — Z23 Encounter for immunization: Secondary | ICD-10-CM

## 2013-03-21 DIAGNOSIS — I8390 Asymptomatic varicose veins of unspecified lower extremity: Secondary | ICD-10-CM

## 2013-03-21 DIAGNOSIS — J441 Chronic obstructive pulmonary disease with (acute) exacerbation: Secondary | ICD-10-CM

## 2013-03-21 DIAGNOSIS — I2699 Other pulmonary embolism without acute cor pulmonale: Secondary | ICD-10-CM | POA: Diagnosis not present

## 2013-03-21 DIAGNOSIS — F329 Major depressive disorder, single episode, unspecified: Secondary | ICD-10-CM

## 2013-03-21 DIAGNOSIS — E669 Obesity, unspecified: Secondary | ICD-10-CM

## 2013-03-21 DIAGNOSIS — F3289 Other specified depressive episodes: Secondary | ICD-10-CM | POA: Diagnosis not present

## 2013-03-21 DIAGNOSIS — F32A Depression, unspecified: Secondary | ICD-10-CM

## 2013-03-21 LAB — POCT URINALYSIS DIPSTICK
Bilirubin, UA: NEGATIVE
Glucose, UA: NEGATIVE
Ketones, UA: NEGATIVE
Leukocytes, UA: NEGATIVE
Nitrite, UA: NEGATIVE
Protein, UA: NEGATIVE
Spec Grav, UA: 1.025
Urobilinogen, UA: 0.2
pH, UA: 6

## 2013-03-21 LAB — CBC WITH DIFFERENTIAL/PLATELET
Basophils Absolute: 0.1 10*3/uL (ref 0.0–0.1)
Basophils Relative: 1 % (ref 0–1)
Eosinophils Absolute: 0.6 10*3/uL (ref 0.0–0.7)
Eosinophils Relative: 9 % — ABNORMAL HIGH (ref 0–5)
HCT: 43 % (ref 39.0–52.0)
Hemoglobin: 14.7 g/dL (ref 13.0–17.0)
Lymphocytes Relative: 16 % (ref 12–46)
Lymphs Abs: 1 10*3/uL (ref 0.7–4.0)
MCH: 29.5 pg (ref 26.0–34.0)
MCHC: 34.2 g/dL (ref 30.0–36.0)
MCV: 86.3 fL (ref 78.0–100.0)
Monocytes Absolute: 0.9 10*3/uL (ref 0.1–1.0)
Monocytes Relative: 15 % — ABNORMAL HIGH (ref 3–12)
Neutro Abs: 3.9 10*3/uL (ref 1.7–7.7)
Neutrophils Relative %: 59 % (ref 43–77)
Platelets: 391 10*3/uL (ref 150–400)
RBC: 4.98 MIL/uL (ref 4.22–5.81)
RDW: 15.1 % (ref 11.5–15.5)
WBC: 6.5 10*3/uL (ref 4.0–10.5)

## 2013-03-21 LAB — COMPREHENSIVE METABOLIC PANEL
ALT: 20 U/L (ref 0–53)
AST: 18 U/L (ref 0–37)
Albumin: 4.3 g/dL (ref 3.5–5.2)
Alkaline Phosphatase: 62 U/L (ref 39–117)
BUN: 19 mg/dL (ref 6–23)
CO2: 28 mEq/L (ref 19–32)
Calcium: 9.4 mg/dL (ref 8.4–10.5)
Chloride: 103 mEq/L (ref 96–112)
Creat: 0.71 mg/dL (ref 0.50–1.35)
Glucose, Bld: 101 mg/dL — ABNORMAL HIGH (ref 70–99)
Potassium: 5 mEq/L (ref 3.5–5.3)
Sodium: 140 mEq/L (ref 135–145)
Total Bilirubin: 0.4 mg/dL (ref 0.3–1.2)
Total Protein: 6.9 g/dL (ref 6.0–8.3)

## 2013-03-21 LAB — POCT UA - MICROSCOPIC ONLY
Bacteria, U Microscopic: NEGATIVE
Casts, Ur, LPF, POC: NEGATIVE
Crystals, Ur, HPF, POC: NEGATIVE
Yeast, UA: NEGATIVE

## 2013-03-21 LAB — LIPID PANEL
Cholesterol: 187 mg/dL (ref 0–200)
HDL: 47 mg/dL (ref >39)
LDL Cholesterol: 114 mg/dL — ABNORMAL HIGH (ref 0–99)
Total CHOL/HDL Ratio: 4 Ratio
Triglycerides: 132 mg/dL (ref <150)
VLDL: 26 mg/dL (ref 0–40)

## 2013-03-21 LAB — PSA, MEDICARE: PSA: 0.43 ng/mL (ref <=4.00)

## 2013-03-21 LAB — TSH: TSH: 1.855 u[IU]/mL (ref 0.350–4.500)

## 2013-03-21 MED ORDER — CITALOPRAM HYDROBROMIDE 20 MG PO TABS: 20.0000 mg | ORAL_TABLET | Freq: Every day | ORAL | Status: DC

## 2013-03-21 MED ORDER — WARFARIN SODIUM 5 MG PO TABS: ORAL_TABLET | ORAL | Status: DC

## 2013-03-21 MED ORDER — SILDENAFIL CITRATE 100 MG PO TABS: 100.0000 mg | ORAL_TABLET | Freq: Every day | ORAL | Status: DC | PRN

## 2013-03-21 NOTE — Progress Notes (Signed)
  Subjective:    Patient ID: Marc Mills, male    DOB: 1945-05-07, 67 y.o.   MRN: [id number]  HPI    Review of Systems  Constitutional: Negative.   HENT: Positive for hearing loss and tinnitus.   Eyes: Negative.   Respiratory: Negative.   Cardiovascular: Negative.   Gastrointestinal: Negative.   Endocrine: Negative.   Genitourinary: Negative.   Musculoskeletal: Positive for arthralgias.  Skin: Negative.   Allergic/Immunologic: Negative.   Neurological: Negative.   Hematological: Negative.   Psychiatric/Behavioral: Negative.        Objective:   Physical Exam HEENT exam reveals a lens implant on the left thigh the right has a small cataract TMs have some wax in bilateral hearing H. Throat is normal neck supple there are no carotid bruits chest was clear to auscultation and percussion. Heart regular rate no murmurs rubs or gallops. Abdomen has a scar in the upper abdomen with a small hernia present medially. GU exam reveals a normal male testicles descended no hernias are felt rectal exam was deferred due to anal stenosis. His extremity exam reveals bilateral varicosities with stasis changes his pulses are 2+ and symmetrical. Over the left knee is a 6 inch laceration with crusting present in the wound there is a minimal amount of surrounding redness.        Assessment & Plan:  Patient looks great. He has a history of lymphoma and has yearly checkups with Dr. Daneil Dolin not. He is up-to-date on his vaccinations for encapsulated organisms pneumococcus meningococcus and H. influenzae. His T. gap is up to date. He was given a flu shot today. We deferred his shingles vaccine. Recheck 1 year. He continues to have his Coumadin monitored at the Coumadin clinic. He sees Dr. Maple Hudson on a regular basis for treatment.

## 2013-04-03 ENCOUNTER — Ambulatory Visit (INDEPENDENT_AMBULATORY_CARE_PROVIDER_SITE_OTHER): Payer: Medicare Other | Admitting: Internal Medicine

## 2013-04-03 VITALS — BP 140/70 | HR 113 | Temp 102.1°F | Resp 20 | Ht 70.5 in | Wt 350.0 lb

## 2013-04-03 DIAGNOSIS — L02419 Cutaneous abscess of limb, unspecified: Secondary | ICD-10-CM

## 2013-04-03 DIAGNOSIS — R509 Fever, unspecified: Secondary | ICD-10-CM | POA: Diagnosis not present

## 2013-04-03 DIAGNOSIS — D72829 Elevated white blood cell count, unspecified: Secondary | ICD-10-CM

## 2013-04-03 DIAGNOSIS — L03119 Cellulitis of unspecified part of limb: Secondary | ICD-10-CM | POA: Diagnosis not present

## 2013-04-03 DIAGNOSIS — L03116 Cellulitis of left lower limb: Secondary | ICD-10-CM

## 2013-04-03 LAB — POCT URINALYSIS DIPSTICK
Bilirubin, UA: NEGATIVE
Glucose, UA: NEGATIVE
Leukocytes, UA: NEGATIVE
Nitrite, UA: NEGATIVE
Spec Grav, UA: 1.02
Urobilinogen, UA: 0.2
pH, UA: 5

## 2013-04-03 LAB — POCT UA - MICROSCOPIC ONLY
Bacteria, U Microscopic: NEGATIVE
Casts, Ur, LPF, POC: NEGATIVE
Crystals, Ur, HPF, POC: NEGATIVE
Mucus, UA: NEGATIVE
Yeast, UA: NEGATIVE

## 2013-04-03 LAB — POCT CBC
Granulocyte percent: 90.9 %G — AB (ref 37–80)
HCT, POC: 46 % (ref 43.5–53.7)
Hemoglobin: 14.6 g/dL (ref 14.1–18.1)
Lymph, poc: 1.5 (ref 0.6–3.4)
MCH, POC: 29.7 pg (ref 27–31.2)
MCHC: 31.7 g/dL — AB (ref 31.8–35.4)
MCV: 93.6 fL (ref 80–97)
MID (cbc): 1.2 — AB (ref 0–0.9)
MPV: 9.6 fL (ref 0–99.8)
POC Granulocyte: 27.3 — AB (ref 2–6.9)
POC LYMPH PERCENT: 5 %L — AB (ref 10–50)
POC MID %: 4.1 %M (ref 0–12)
Platelet Count, POC: 372 10*3/uL (ref 142–424)
RBC: 4.91 M/uL (ref 4.69–6.13)
RDW, POC: 14.7 %
WBC: 30 10*3/uL — AB (ref 4.6–10.2)

## 2013-04-03 MED ORDER — CEFTRIAXONE SODIUM 1 G IJ SOLR
2.0000 g | Freq: Once | INTRAMUSCULAR | Status: AC
  Administered 2013-04-03: 2 g via INTRAMUSCULAR

## 2013-04-03 MED ORDER — DOXYCYCLINE HYCLATE 100 MG PO TABS: 100.0000 mg | ORAL_TABLET | Freq: Two times a day (BID) | ORAL | Status: DC

## 2013-04-03 NOTE — Progress Notes (Signed)
   Subjective:    Patient ID: Marc Mills, male    DOB: 01-19-1946, 67 y.o.   MRN: [id number]  HPI  Patient presents with fever and chills started this morning.  Hx of recurrent cellulitis, last time right lower extremity. He does not feel pain in legs yet. He has no spleen, cured lymphoma with splenectomy but immunosuppressed.  Review of Systems Stable dvt/pe on coumadin    Objective:   Physical Exam  Constitutional: He is oriented to person, place, and time. He appears well-nourished. No distress.  HENT:  Head: Normocephalic.  Right Ear: External ear normal.  Left Ear: External ear normal.  Mouth/Throat: Oropharynx is clear and moist.  Eyes: EOM are normal. Pupils are equal, round, and reactive to light. No scleral icterus.  Neck: Neck supple.  Cardiovascular: Normal rate, regular rhythm and normal heart sounds.   Pulmonary/Chest: Effort normal and breath sounds normal. He has no rales.  Abdominal: Soft. There is no tenderness.  Musculoskeletal: Normal range of motion. He exhibits no edema and no tenderness.  Neurological: He is alert and oriented to person, place, and time. He exhibits normal muscle tone. Coordination normal.  Skin: Rash noted. No abrasion noted. Rash is not pustular. There is erythema.     Warmer to touch compared to right  slitely red  Psychiatric: He has a normal mood and affect.   Results for orders placed in visit on 04/03/13  POCT URINALYSIS DIPSTICK      Result Value Range   Color, UA yellow     Clarity, UA clear     Glucose, UA neg     Bilirubin, UA neg     Ketones, UA trace     Spec Grav, UA 1.020     Blood, UA moderate     pH, UA 5.0     Protein, UA trace     Urobilinogen, UA 0.2     Nitrite, UA neg     Leukocytes, UA Negative    POCT CBC      Result Value Range   WBC 30.0 (*) 4.6 - 10.2 K/uL   Lymph, poc 1.5  0.6 - 3.4   POC LYMPH PERCENT 5.0 (*) 10 - 50 %L   MID (cbc) 1.2 (*) 0 - 0.9   POC MID % 4.1  0 - 12 %M   POC  Granulocyte 27.3 (*) 2 - 6.9   Granulocyte percent 90.9 (*) 37 - 80 %G   RBC 4.91  4.69 - 6.13 M/uL   Hemoglobin 14.6  14.1 - 18.1 g/dL   HCT, POC 46.9  62.9 - 53.7 %   MCV 93.6  80 - 97 fL   MCH, POC 29.7  27 - 31.2 pg   MCHC 31.7 (*) 31.8 - 35.4 g/dL   RDW, POC 52.8     Platelet Count, POC 372  142 - 424 K/uL   MPV 9.6  0 - 99.8 fL          Assessment & Plan:  2 grams rocephin Doxy 100mg  bid

## 2013-04-03 NOTE — Patient Instructions (Signed)
Cellulitis Cellulitis is an infection of the skin and the tissue beneath it. The infected area is usually red and tender. Cellulitis occurs most often in the arms and lower legs.  CAUSES  Cellulitis is caused by bacteria that enter the skin through cracks or cuts in the skin. The most common types of bacteria that cause cellulitis are Staphylococcus and Streptococcus. SYMPTOMS   Redness and warmth.  Swelling.  Tenderness or pain.  Fever. DIAGNOSIS  Your caregiver can usually determine what is wrong based on a physical exam. Blood tests may also be done. TREATMENT  Treatment usually involves taking an antibiotic medicine. HOME CARE INSTRUCTIONS   Take your antibiotics as directed. Finish them even if you start to feel better.  Keep the infected arm or leg elevated to reduce swelling.  Apply a warm cloth to the affected area up to 4 times per day to relieve pain.  Only take over-the-counter or prescription medicines for pain, discomfort, or fever as directed by your caregiver.  Keep all follow-up appointments as directed by your caregiver. SEEK MEDICAL CARE IF:   You notice red streaks coming from the infected area.  Your red area gets larger or turns dark in color.  Your bone or joint underneath the infected area becomes painful after the skin has healed.  Your infection returns in the same area or another area.  You notice a swollen bump in the infected area.  You develop new symptoms. SEEK IMMEDIATE MEDICAL CARE IF:   You have a fever.  You feel very sleepy.  You develop vomiting or diarrhea.  You have a general ill feeling (malaise) with muscle aches and pains. MAKE SURE YOU:   Understand these instructions.  Will watch your condition.  Will get help right away if you are not doing well or get worse. Document Released: 01/29/2005 Document Revised: 10/21/2011 Document Reviewed: 07/07/2011 [employer] Patient Information 2014 [employer].  

## 2013-04-03 NOTE — Progress Notes (Signed)
   Subjective:    Patient ID: Marc Mills, male    DOB: 1945-07-24, 67 y.o.   MRN: [id number]  HPI    Review of Systems     Objective:   Physical Exam        Assessment & Plan:

## 2013-04-04 ENCOUNTER — Encounter (HOSPITAL_COMMUNITY): Payer: Self-pay | Admitting: General Practice

## 2013-04-04 ENCOUNTER — Inpatient Hospital Stay (HOSPITAL_COMMUNITY)
Admission: AD | Admit: 2013-04-04 | Discharge: 2013-04-07 | DRG: 603 | Disposition: A | Discharge status: Home / Self Care | Payer: Medicare Other | Source: Ambulatory Visit | Attending: Family Medicine | Admitting: Family Medicine

## 2013-04-04 ENCOUNTER — Ambulatory Visit (INDEPENDENT_AMBULATORY_CARE_PROVIDER_SITE_OTHER): Payer: Medicare Other | Admitting: Emergency Medicine

## 2013-04-04 VITALS — BP 110/68 | HR 81 | Temp 99.3°F | Resp 18 | Wt 348.0 lb

## 2013-04-04 DIAGNOSIS — J4489 Other specified chronic obstructive pulmonary disease: Secondary | ICD-10-CM | POA: Diagnosis present

## 2013-04-04 DIAGNOSIS — Z87898 Personal history of other specified conditions: Secondary | ICD-10-CM

## 2013-04-04 DIAGNOSIS — D72829 Elevated white blood cell count, unspecified: Secondary | ICD-10-CM

## 2013-04-04 DIAGNOSIS — Z803 Family history of malignant neoplasm of breast: Secondary | ICD-10-CM

## 2013-04-04 DIAGNOSIS — Z9089 Acquired absence of other organs: Secondary | ICD-10-CM | POA: Diagnosis not present

## 2013-04-04 DIAGNOSIS — Z86711 Personal history of pulmonary embolism: Secondary | ICD-10-CM

## 2013-04-04 DIAGNOSIS — J449 Chronic obstructive pulmonary disease, unspecified: Secondary | ICD-10-CM | POA: Diagnosis present

## 2013-04-04 DIAGNOSIS — Z8249 Family history of ischemic heart disease and other diseases of the circulatory system: Secondary | ICD-10-CM | POA: Diagnosis not present

## 2013-04-04 DIAGNOSIS — Z7901 Long term (current) use of anticoagulants: Secondary | ICD-10-CM | POA: Diagnosis not present

## 2013-04-04 DIAGNOSIS — L02419 Cutaneous abscess of limb, unspecified: Principal | ICD-10-CM | POA: Diagnosis present

## 2013-04-04 DIAGNOSIS — Z86718 Personal history of other venous thrombosis and embolism: Secondary | ICD-10-CM

## 2013-04-04 DIAGNOSIS — F3289 Other specified depressive episodes: Secondary | ICD-10-CM | POA: Diagnosis present

## 2013-04-04 DIAGNOSIS — G4733 Obstructive sleep apnea (adult) (pediatric): Secondary | ICD-10-CM | POA: Diagnosis present

## 2013-04-04 DIAGNOSIS — Z96659 Presence of unspecified artificial knee joint: Secondary | ICD-10-CM | POA: Diagnosis not present

## 2013-04-04 DIAGNOSIS — Z6841 Body Mass Index (BMI) 40.0 and over, adult: Secondary | ICD-10-CM

## 2013-04-04 DIAGNOSIS — Z87891 Personal history of nicotine dependence: Secondary | ICD-10-CM

## 2013-04-04 DIAGNOSIS — E669 Obesity, unspecified: Secondary | ICD-10-CM | POA: Diagnosis present

## 2013-04-04 DIAGNOSIS — L03116 Cellulitis of left lower limb: Secondary | ICD-10-CM

## 2013-04-04 DIAGNOSIS — L0390 Cellulitis, unspecified: Secondary | ICD-10-CM

## 2013-04-04 DIAGNOSIS — L0291 Cutaneous abscess, unspecified: Secondary | ICD-10-CM | POA: Diagnosis not present

## 2013-04-04 DIAGNOSIS — M1712 Unilateral primary osteoarthritis, left knee: Secondary | ICD-10-CM

## 2013-04-04 DIAGNOSIS — Z9081 Acquired absence of spleen: Secondary | ICD-10-CM

## 2013-04-04 DIAGNOSIS — F329 Major depressive disorder, single episode, unspecified: Secondary | ICD-10-CM | POA: Diagnosis present

## 2013-04-04 DIAGNOSIS — L03115 Cellulitis of right lower limb: Secondary | ICD-10-CM

## 2013-04-04 DIAGNOSIS — L03119 Cellulitis of unspecified part of limb: Secondary | ICD-10-CM | POA: Diagnosis present

## 2013-04-04 HISTORY — DX: Cellulitis, unspecified: L03.90

## 2013-04-04 LAB — POCT CBC
Granulocyte percent: 92.3 %G — AB (ref 37–80)
HCT, POC: 43.3 % — AB (ref 43.5–53.7)
Hemoglobin: 13.5 g/dL — AB (ref 14.1–18.1)
Lymph, poc: 0.9 (ref 0.6–3.4)
MCH, POC: 29 pg (ref 27–31.2)
MCHC: 31.2 g/dL — AB (ref 31.8–35.4)
MCV: 93.1 fL (ref 80–97)
MID (cbc): 0.7 (ref 0–0.9)
MPV: 9.4 fL (ref 0–99.8)
POC Granulocyte: 19.9 — AB (ref 2–6.9)
POC LYMPH PERCENT: 4.3 %L — AB (ref 10–50)
POC MID %: 3.4 %M (ref 0–12)
Platelet Count, POC: 286 10*3/uL (ref 142–424)
RBC: 4.65 M/uL — AB (ref 4.69–6.13)
RDW, POC: 14.9 %
WBC: 21.6 10*3/uL — AB (ref 4.6–10.2)

## 2013-04-04 LAB — PROTIME-INR
INR: 2.18 — ABNORMAL HIGH (ref 0.00–1.49)
Prothrombin Time: 23.6 seconds — ABNORMAL HIGH (ref 11.6–15.2)

## 2013-04-04 MED ORDER — ACETAMINOPHEN 650 MG RE SUPP: 650.0000 mg | Freq: Four times a day (QID) | RECTAL | Status: DC | PRN

## 2013-04-04 MED ORDER — SODIUM CHLORIDE 0.9 % IJ SOLN: 3.0000 mL | INTRAMUSCULAR | Status: DC | PRN

## 2013-04-04 MED ORDER — CITALOPRAM HYDROBROMIDE 20 MG PO TABS
20.0000 mg | ORAL_TABLET | Freq: Every day | ORAL | Status: DC
  Administered 2013-04-04 – 2013-04-06 (×3): 20 mg via ORAL
  Filled 2013-04-04 (×4): qty 1

## 2013-04-04 MED ORDER — VANCOMYCIN HCL 10 G IV SOLR
1500.0000 mg | Freq: Two times a day (BID) | INTRAVENOUS | Status: DC
  Administered 2013-04-05 – 2013-04-06 (×4): 1500 mg via INTRAVENOUS
  Filled 2013-04-04 (×5): qty 1500

## 2013-04-04 MED ORDER — SODIUM CHLORIDE 0.9 % IV SOLN
2500.0000 mg | Freq: Once | INTRAVENOUS | Status: AC
  Administered 2013-04-04: 2500 mg via INTRAVENOUS
  Filled 2013-04-04: qty 2500

## 2013-04-04 MED ORDER — ACETAMINOPHEN 325 MG PO TABS
650.0000 mg | ORAL_TABLET | ORAL | Status: DC | PRN
  Administered 2013-04-04 – 2013-04-07 (×7): 650 mg via ORAL
  Filled 2013-04-04 (×7): qty 2

## 2013-04-04 MED ORDER — WARFARIN SODIUM 2.5 MG PO TABS
2.5000 mg | ORAL_TABLET | ORAL | Status: DC
  Filled 2013-04-04: qty 1

## 2013-04-04 MED ORDER — WARFARIN SODIUM 5 MG PO TABS: 5.0000 mg | ORAL_TABLET | Freq: Once | ORAL | Status: DC

## 2013-04-04 MED ORDER — SODIUM CHLORIDE 0.9 % IJ SOLN
3.0000 mL | Freq: Two times a day (BID) | INTRAMUSCULAR | Status: DC
  Administered 2013-04-04 – 2013-04-06 (×4): 3 mL via INTRAVENOUS

## 2013-04-04 MED ORDER — ACETAMINOPHEN 650 MG RE SUPP: 650.0000 mg | RECTAL | Status: DC | PRN

## 2013-04-04 MED ORDER — ACETAMINOPHEN 325 MG PO TABS
650.0000 mg | ORAL_TABLET | Freq: Four times a day (QID) | ORAL | Status: DC | PRN
  Administered 2013-04-04: 650 mg via ORAL
  Filled 2013-04-04: qty 2

## 2013-04-04 MED ORDER — WARFARIN SODIUM 5 MG PO TABS
5.0000 mg | ORAL_TABLET | ORAL | Status: DC
  Administered 2013-04-04 – 2013-04-06 (×3): 5 mg via ORAL
  Filled 2013-04-04 (×4): qty 1

## 2013-04-04 MED ORDER — CEFTRIAXONE SODIUM 1 G IJ SOLR
1.0000 g | Freq: Once | INTRAMUSCULAR | Status: AC
  Administered 2013-04-04: 1 g via INTRAMUSCULAR

## 2013-04-04 MED ORDER — WARFARIN - PHARMACIST DOSING INPATIENT
Freq: Every day | Status: DC
  Administered 2013-04-05 – 2013-04-06 (×2)

## 2013-04-04 MED ORDER — SODIUM CHLORIDE 0.9 % IV SOLN
250.0000 mL | INTRAVENOUS | Status: DC | PRN
  Administered 2013-04-04: 250 mL via INTRAVENOUS

## 2013-04-04 NOTE — Progress Notes (Signed)
   Subjective:    Patient ID: Marc Mills, male    DOB: 01-13-1946, 67 y.o.   MRN: [id number]  HPI patient seen last night with cellulitis suspected of his lower chimneys. He had fevers chills and a white count of 30,000. He was given a shot of Rocephin and started on doxycycline and returns morning for recheck. Patient has a history of cellulitis involving the right leg the he is status post left knee replacement and is on chronic Coumadin therapy for PE and has a filter in. Over the night he has developed significant redness over the left leg.     Review of Systems     Objective:   Physical ExamThere is a 3 x 3" area of redness superior to the patella of the left leg. There is a healed wound over the left knee that is proximally 6 inches in length. There is normal range of motion in his left knee where he has had a replacement. There is significant circumferential redness which extends from approximately 4 inches below the patella to the ankle. There is severe stasis changes varicose veins         Assessment & Plan:   Patient here to followup cellulitis of his left leg. Over the evening he has had significant worsening of the redness involving his left leg. He received a shot of Rocephin and was started on doxy last night. He had an admission last year and was treated with vancomycin. He has a left total knee replacement. He is immunocompromised secondary to a splenectomy for lymphoma. Patient given 1 g of Rocephin prior to transfer. Family medicine resident called he will be a direct admit.

## 2013-04-04 NOTE — H&P (Signed)
Family Medicine Teaching [facility] Admission History and Physical Service Pager: [phone]  Patient name: Marc Mills Medical record number: [id number] Date of birth: 07-Jul-1945 Age: 67 y.o. Gender: male  Primary Care Provider: Lucilla Edin, MD Consultants: None Code Status: Full  Chief Complaint: Cellulitis   Assessment and Plan: Marc Mills is a 67 y.o. male presenting with cellulitis of left lower extremity that failed to respond to ceftriaxone and doxycyline. PMH is significant for splenectomy s/p lymphoma, right LE cellulitis in 2013, left total knee replacement in July 2014, and  history DVT/PE requiring anticoagulation.   # Cellulitis LLE - Likely due to gram positive bacteria, presumably MRSA; Distribution in 3 areas: superior L knee 7.5 x 6 cm, L upper thigh 10 x 5 cm, entire anterior left lower leg; No evidence of joint involvement of left knee - obtain blood cultures given fever, tachycardia yesterday - start Vancomycin given poor response to PO doxy and known response to Vanc with cellulitis in 2013 - if any concern for progression to involve joint, consider arthrocentesis vs imaging and contact orthopedic surgeon Norlene Campbell)  # Hx of multiple PE - on chronic anticoagulation - check INR and cont coumadin  # Depression  - cont home celexa  FEN/GI: Regular Diet, KVO IVF  Prophylaxis: on full dose anti-coagulation   Disposition: Admit to inpatient service under Dr. Gwendolyn Grant  History of Present Illness: Marc Mills is a 67 y.o. male presenting with swelling and, redness of left lower extremity. The symptoms started 1 day ago. The patient was seen at [facility] and found to have fever, tachycardia and swollen left leg concerning for cellulitis. He was  given Ceftriaxone 2 gm IM and a prescription for doxyxcycline. He took doxycycline 100 mg last night and this morning. [facility] for follow  up and was worsening in it's presentation. Therefore, he was given Ceftriaxone 1 g IM and transported to the hospital for further care. At this time he denies pain, fever, chills, nausea, vomiting, diarrhea, chest pain or shortness of breath. He notes that this appearance is similar to his cellulitis of the right leg in 2013. He had a laceration on his anterior left knee 3 weeks ago that healed, but no other wounds. He denies pain in the knee.   Review Of Systems: Per HPI with the following additions: none Otherwise 12 point review of systems was performed and was unremarkable.  Patient Active Problem List   Diagnosis Date Noted  . Osteoarthritis of left knee 11/02/2012  . COPD, mild 10/18/2012  . Cellulitis of right lower extremity 04/30/2012  . History of DVT of lower extremity 04/29/2012  . Status post splenectomy 04/29/2012  . Incisional hernia 05/27/2011  . LYMPHOMA, HX OF 04/16/2010  . MORBID OBESITY 04/19/2008  . OBSTRUCTIVE SLEEP APNEA 04/19/2008  . VARICOSE VEINS, LOWER EXTREMITIES 04/19/2008  . PULMONARY EMBOLISM, HX OF 04/19/2008   Past Medical History: Past Medical History  Diagnosis Date  . Morbid obesity   . History of pulmonary embolus (PE)   . History of lymphoma   . Incisional hernia   . Arthritis   . Blood transfusion without reported diagnosis   . DVT (deep venous thrombosis)     2011 right leg  . Anorectal fistula   . OSA on CPAP     cpap  10 yrs  . Varicose veins of lower extremities     pe  . Cancer  lymphoma hx  . Cataract    Past Surgical History: Past Surgical History  Procedure Laterality Date  . Splenectomy    . Prostate surgery      (patient denies)  . Ivc filter    . Tonsillectomy      age 47  . Eye surgery Left 03    cat, detached ret    . Hernia repair    . Total knee arthroplasty Left 11/02/2012    Procedure: TOTAL KNEE ARTHROPLASTY with revision tibia;  Surgeon: Valeria Batman, MD;  Location: [facility] OR;  Service: Orthopedics;   Laterality: Left;  . Fracture surgery     Social History: History  Substance Use Topics  . Smoking status: Former Smoker -- 2.00 packs/day for 20 years    Types: Cigarettes    Quit date: 05/05/1984  . Smokeless tobacco: Never Used  . 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  Family History: Family History  Problem Relation Age of Onset  . Breast cancer    . Heart disease Father   . Heart disease Mother 64   Allergies and Medications: Allergies  Allergen Reactions  . Hydrocodone Itching and Other (See Comments)    Gives me chills  . Oxycodone Itching and Other (See Comments)    Gives me chills   No current facility-administered medications on file prior to encounter.   Current Outpatient Prescriptions on File Prior to Encounter  Medication Sig Dispense Refill  . acetaminophen (TYLENOL) 500 MG tablet Take 2 tablets (1,000 mg total) by mouth every 6 (six) hours as needed for pain. For pain  30 tablet  0  . b complex vitamins tablet Take 1 tablet by mouth at bedtime.       . calcium carbonate (OS-CAL) 600 MG TABS Take 600 mg by mouth at bedtime.       . celecoxib (CELEBREX) 200 MG capsule Take 200 mg by mouth as needed.      . cholecalciferol (VITAMIN D) 1000 UNITS tablet Take 1,000 Units by mouth at bedtime.       . citalopram (CELEXA) 20 MG tablet Take 1 tablet (20 mg total) by mouth daily. Needs office visit  90 tablet  3  . doxycycline (VIBRA-TABS) 100 MG tablet Take 1 tablet (100 mg total) by mouth 2 (two) times daily.  20 tablet  0  . methocarbamol (ROBAXIN) 500 MG tablet Take 1 tablet (500 mg total) by mouth every 6 (six) hours as needed.  40 tablet  0  . Multiple Vitamin (MULTIVITAMIN) tablet Take 1 tablet by mouth at bedtime.       . sildenafil (VIAGRA) 100 MG tablet Take 1 tablet (100 mg total) by mouth daily as needed for erectile dysfunction. Splits the tablet in half prn  10  tablet  11  . warfarin (COUMADIN) 5 MG tablet TAKE 1 TABLET (5 MG TOTAL) BY MOUTH AS DIRECTED.  90 tablet  3    Objective: BP 116/69  Pulse 85  Temp(Src) 97.3 F (36.3 C) (Oral)  Resp 18  Ht 5' 10.5" (1.791 m)  Wt 350 lb 1.5 oz (158.8 kg)  BMI 49.51 kg/m2  SpO2 95% Exam: General: elderly WM, pleasant, obese, non distressed HEENT: NCAT, PERRLA, OP clear and moist Cardiovascular: RRR, no murmurs Respiratory: normal work of breathing, clear to auscultation bilaterally Abdomen: soft, NDNT, NABS Extremities: left knee without effusions, normal ROM   Skin:  LLE with erythema and warmth in following distribution in 3 areas: superior L knee 7.5 x 6 cm, L upper thigh 10 x 5 cm, entire anterior left lower leg Neuro: alert and oriented x 3, no focal deficits  Labs and Imaging: CBC BMET   Recent Labs Lab 04/04/13 0929  WBC 21.6*  HGB 13.5*  HCT 43.3*   No results found for this basename: NA, K, CL, CO2, BUN, CREATININE, GLUCOSE, CALCIUM,  in the last 168 hours    Garnetta Buddy, MD 04/04/2013, 12:10 PM PGY-3, [facility] FPTS Intern pager: [phone], text pages welcome

## 2013-04-04 NOTE — Progress Notes (Signed)
Pt admitted to 6n24. VSS. NAD. MD notified of admission. IVT notified of pt request for them to start IV. Oriented to room. Callbell placed within reach.

## 2013-04-04 NOTE — Patient Instructions (Signed)
You're going to be admitted to the hospital. Please go to admissions and they will page the resident on call. [phone] .

## 2013-04-04 NOTE — Progress Notes (Addendum)
ANTIBIOTIC CONSULT NOTE - INITIAL  Pharmacy Consult for vancomycin and coumadin Indication: cellulitis, h/o VTE  Allergies  Allergen Reactions  . Hydrocodone Itching and Other (See Comments)    Gives me chills  . Oxycodone Itching and Other (See Comments)    Gives me chills    Patient Measurements: Height: 5' 10.5" (179.1 cm) Weight: 350 lb 1.5 oz (158.8 kg) IBW/kg (Calculated) : 74.15 Ideal Body Weight: 74.5 kg  Vital Signs: Temp: 97.3 F (36.3 C) (12/01 1150) Temp src: Oral (12/01 1150) BP: 116/69 mmHg (12/01 1150) Pulse Rate: 85 (12/01 1150) Intake/Output from previous day:   Intake/Output from this shift:    Labs:  Recent Labs  04/03/13 1830 04/04/13 0929  WBC 30.0* 21.6*  HGB 14.6 13.5*   Estimated Creatinine Clearance: 136.9 ml/min (by C-G formula based on Cr of 0.71). No results found for this basename: VANCOTROUGH, VANCOPEAK, VANCORANDOM, GENTTROUGH, GENTPEAK, GENTRANDOM, TOBRATROUGH, TOBRAPEAK, TOBRARND, AMIKACINPEAK, AMIKACINTROU, AMIKACIN,  in the last 72 hours   Microbiology: No results found for this or any previous visit (from the past 720 hour(s)).  Medical History: Past Medical History  Diagnosis Date  . Morbid obesity   . History of pulmonary embolus (PE)   . History of lymphoma   . Incisional hernia   . Arthritis   . Blood transfusion without reported diagnosis   . DVT (deep venous thrombosis)     2011 right leg  . Anorectal fistula   . OSA on CPAP     cpap  10 yrs  . Varicose veins of lower extremities     pe  . Cancer     lymphoma hx  . Cataract     Medications:  Prescriptions prior to admission  Medication Sig Dispense Refill  . acetaminophen (TYLENOL) 500 MG tablet Take 2 tablets (1,000 mg total) by mouth every 6 (six) hours as needed for pain. For pain  30 tablet  0  . b complex vitamins tablet Take 1 tablet by mouth at bedtime.       . calcium carbonate (OS-CAL) 600 MG TABS Take 600 mg by mouth at bedtime.       .  celecoxib (CELEBREX) 200 MG capsule Take 200 mg by mouth as needed.      . cholecalciferol (VITAMIN D) 1000 UNITS tablet Take 1,000 Units by mouth at bedtime.       . citalopram (CELEXA) 20 MG tablet Take 1 tablet (20 mg total) by mouth daily. Needs office visit  90 tablet  3  . doxycycline (VIBRA-TABS) 100 MG tablet Take 1 tablet (100 mg total) by mouth 2 (two) times daily.  20 tablet  0  . Multiple Vitamin (MULTIVITAMIN) tablet Take 1 tablet by mouth at bedtime.       . sildenafil (VIAGRA) 100 MG tablet Take 1 tablet (100 mg total) by mouth daily as needed for erectile dysfunction. Splits the tablet in half prn  10 tablet  11  . warfarin (COUMADIN) 5 MG tablet TAKE 1 TABLET (5 MG TOTAL) BY MOUTH AS DIRECTED.  90 tablet  3  . [DISCONTINUED] methocarbamol (ROBAXIN) 500 MG tablet Take 1 tablet (500 mg total) by mouth every 6 (six) hours as needed.  40 tablet  0   Assessment: 67 yo man to start vancomycin for cellulitis.  He was given Rocephin IM and po doxycycline as outpt but symptoms did not improve.  He will continue coumadin for h/o VTE.  Goal of Therapy:  Vancomycin trough level 10-15 mcg/ml  INR 2-3  Plan:  Vancomycin 2500 mg IV x 1 then 1500 mg IV q12 hours as per obesity nomogram F/u renal function, cultures and clinical course. F/u INR for coumadin dosing.  Talbert Cage Poteet 04/04/2013,2:09 PM  Addendum: PT/INR is 23.6/2.18 and no noted bleeding.  Will continue his home regimen and f/u labs adjusting as needed.  Nadara Mustard, PharmD., MS Clinical Pharmacist Pager:  [phone] Thank you for allowing pharmacy to be part of this patients care team.

## 2013-04-05 DIAGNOSIS — G4733 Obstructive sleep apnea (adult) (pediatric): Secondary | ICD-10-CM

## 2013-04-05 DIAGNOSIS — J4489 Other specified chronic obstructive pulmonary disease: Secondary | ICD-10-CM

## 2013-04-05 DIAGNOSIS — J449 Chronic obstructive pulmonary disease, unspecified: Secondary | ICD-10-CM

## 2013-04-05 DIAGNOSIS — L02419 Cutaneous abscess of limb, unspecified: Secondary | ICD-10-CM | POA: Diagnosis not present

## 2013-04-05 DIAGNOSIS — Z86718 Personal history of other venous thrombosis and embolism: Secondary | ICD-10-CM | POA: Diagnosis not present

## 2013-04-05 DIAGNOSIS — IMO0002 Reserved for concepts with insufficient information to code with codable children: Secondary | ICD-10-CM

## 2013-04-05 DIAGNOSIS — M1710 Unilateral primary osteoarthritis, unspecified knee: Secondary | ICD-10-CM

## 2013-04-05 LAB — CBC
HCT: 38.9 % — ABNORMAL LOW (ref 39.0–52.0)
Hemoglobin: 13.1 g/dL (ref 13.0–17.0)
MCH: 29.8 pg (ref 26.0–34.0)
MCHC: 33.7 g/dL (ref 30.0–36.0)
MCV: 88.6 fL (ref 78.0–100.0)
Platelets: 257 10*3/uL (ref 150–400)
RBC: 4.39 MIL/uL (ref 4.22–5.81)
RDW: 15.7 % — ABNORMAL HIGH (ref 11.5–15.5)
WBC: 16.5 10*3/uL — ABNORMAL HIGH (ref 4.0–10.5)

## 2013-04-05 LAB — BASIC METABOLIC PANEL
BUN: 10 mg/dL (ref 6–23)
CO2: 27 mEq/L (ref 19–32)
Calcium: 8.6 mg/dL (ref 8.4–10.5)
Chloride: 104 mEq/L (ref 96–112)
Creatinine, Ser: 0.77 mg/dL (ref 0.50–1.35)
GFR calc Af Amer: >90 mL/min (ref >90)
GFR calc non Af Amer: >90 mL/min (ref >90)
Glucose, Bld: 110 mg/dL — ABNORMAL HIGH (ref 70–99)
Potassium: 3.7 mEq/L (ref 3.5–5.1)
Sodium: 140 mEq/L (ref 135–145)

## 2013-04-05 LAB — PROTIME-INR
INR: 1.38 (ref 0.00–1.49)
Prothrombin Time: 16.6 seconds — ABNORMAL HIGH (ref 11.6–15.2)

## 2013-04-05 NOTE — H&P (Signed)
FMTS Attending Admission Note: Renold Don MD Personal pager:  [phone] FPTS Service Pager:  [phone]  I  have seen and examined this patient, reviewed their chart. I have discussed this patient with the resident. I agree with the resident's findings, assessment and care plan.  Additionally:  67 yo non-diabetic admitted after failing outpatient antibiotics for LLE cellulitis.  Improved in short period of time with just vancomycin.  No evidence of knee joint involvement.  Serial LLE exams.  If afebrile and continued improvement after 48 hours, consider shift to PO antibiotics.   Tobey Grim, MD 04/05/2013 3:14 PM

## 2013-04-05 NOTE — Progress Notes (Signed)
ANTICOAGULATION CONSULT NOTE - Follow Up Consult  Pharmacy Consult for coumadin Indication: DVT  Allergies  Allergen Reactions  . Hydrocodone Itching and Other (See Comments)    Gives me chills  . Oxycodone Itching and Other (See Comments)    Gives me chills    Patient Measurements: Height: 5' 10.5" (179.1 cm) Weight: 350 lb 1.5 oz (158.8 kg) IBW/kg (Calculated) : 74.15   Vital Signs: Temp: 97.9 F (36.6 C) (12/02 0523) Temp src: Oral (12/02 0523) BP: 114/78 mmHg (12/02 0523) Pulse Rate: 85 (12/02 0523)  Labs:  Recent Labs  04/03/13 1830 04/04/13 0929 04/04/13 1440 04/05/13 0605  HGB 14.6 13.5*  --  13.1  HCT 46.0 43.3*  --  38.9*  PLT  --   --   --  257  LABPROT  --   --  23.6* 16.6*  INR  --   --  2.18* 1.38  CREATININE  --   --   --  0.77    Estimated Creatinine Clearance: 136.9 ml/min (by C-G formula based on Cr of 0.77).   Medications:  Prescriptions prior to admission  Medication Sig Dispense Refill  . acetaminophen (TYLENOL) 500 MG tablet Take 1,000 mg by mouth every 6 (six) hours as needed for mild pain.      Marland Kitchen b complex vitamins tablet Take 1 tablet by mouth at bedtime.       . calcium carbonate (OS-CAL) 600 MG TABS Take 600 mg by mouth at bedtime.       . celecoxib (CELEBREX) 200 MG capsule Take 200 mg by mouth as needed.      . cholecalciferol (VITAMIN D) 1000 UNITS tablet Take 1,000 Units by mouth at bedtime.       . citalopram (CELEXA) 20 MG tablet Take 20 mg by mouth daily.      Marland Kitchen doxycycline (VIBRA-TABS) 100 MG tablet Take 100 mg by mouth 2 (two) times daily.      . Multiple Vitamin (MULTIVITAMIN) tablet Take 1 tablet by mouth at bedtime.       . sildenafil (VIAGRA) 100 MG tablet Take 1 tablet (100 mg total) by mouth daily as needed for erectile dysfunction. Splits the tablet in half prn  10 tablet  11  . warfarin (COUMADIN) 5 MG tablet Take 2.5-5 mg by mouth daily. Takes 2.5mg  on Thursday and Sunday. Takes 5mg  all other days      .  [DISCONTINUED] methocarbamol (ROBAXIN) 500 MG tablet Take 1 tablet (500 mg total) by mouth every 6 (six) hours as needed.  40 tablet  0    Assessment: 67 yo man to continue coumadin for h/o VTE.  INR was therapeutic on admit but has dropped today. Goal of Therapy:  INR 2-3 Monitor platelets by anticoagulation protocol: Yes   Plan:  Continue coumadin as per home dose, 5 mg today. May need to adjust regimen if INR < 2 tomorrow.  Loralee Weitzman Poteet 04/05/2013,8:37 AM

## 2013-04-05 NOTE — Progress Notes (Signed)
RT Note:  Patient has home CPAP unit set up in room.  Pt is able to place himself on/off.

## 2013-04-05 NOTE — Progress Notes (Signed)
FMTS Attending Daily Note:  Renold Don MD  [phone] pager  Family Practice pager:  [phone] I have seen and examined this patient and have reviewed their chart. I have discussed this patient with the resident. I agree with the resident's findings, assessment and care plan.   Tobey Grim, MD 04/05/2013 3:14 PM

## 2013-04-05 NOTE — Progress Notes (Signed)
Family Medicine Teaching Service Daily Progress Note Intern Pager: [phone]  Patient name: Marc Mills Medical record number: [id number] Date of birth: 1945-08-28 Age: 67 y.o. Gender: male  Primary Care Provider: Lucilla Edin, MD Consultants: none Code Status: full  Pt Overview and Major Events to Date:  04/04/13 - Admitted with cellulitis  Assessment and Plan: Marc Mills is a 67 y.o. male presenting with cellulitis of left lower extremity that failed to respond to ceftriaxone and doxycyline. PMH is significant for splenectomy s/p lymphoma, right LE cellulitis in 2013, left total knee replacement in July 2014, and history DVT/PE requiring anticoagulation.   # Cellulitis LLE - Likely due to gram positive bacteria, presumably MRSA; Distribution in 3 areas: superior L knee 7.5 x 6 cm, L upper thigh 10 x 5 cm, entire anterior left lower leg; No evidence of joint involvement of left knee  - obtain blood cultures given fever, tachycardia yesterday  - continue Vancomycin given poor response to PO doxy and known response to Vanc with cellulitis in 2013  - will likely transition to oral clinda tomorrow pending clinical improvement - if any concern for progression to involve joint, consider arthrocentesis vs imaging and contact orthopedic surgeon Norlene Campbell)   # Hx of multiple PE - on chronic anticoagulation  - check INR and cont coumadin per pharm   # Depression  - cont home celexa   FEN/GI: Regular Diet, KVO IVF  Prophylaxis: on full dose anti-coagulation   Disposition: home pending clinical improvement and transition to oral antibiotics  Subjective: Feeling well this morning, no acute events overnight, does not think his leg has improved very much  Objective: Temp:  [97.3 F (36.3 C)-99.3 F (37.4 C)] 97.9 F (36.6 C) (12/02 0523) Pulse Rate:  [85-100] 85 (12/02 0523) Resp:  [18-19] 18 (12/02 0523) BP: (114-132)/(51-78) 114/78 mmHg (12/02 0523) SpO2:  [91 %-100  %] 97 % (12/02 0523) Weight:  [350 lb 1.5 oz (158.8 kg)] 350 lb 1.5 oz (158.8 kg) (12/01 1150) Physical Exam: General: elderly WM, pleasant, obese, non distressed  HEENT: NCAT, MMM Cardiovascular: RRR, no murmurs  Respiratory: normal work of breathing, clear to auscultation bilaterally  Abdomen: soft, NDNT Extremities: left knee without effusions, normal ROM  Skin: LLE with erythema and warmth in following distribution in 3 areas: superior L knee 7.5 x 6 cm, L upper thigh 10 x 5 cm, entire anterior left lower leg, has extended slightly beyond the markings in a few ares and receded in others, no significant improvement  Neuro: alert and oriented x 3, no focal deficits   Laboratory:  Recent Labs Lab 04/03/13 1830 04/04/13 0929 04/05/13 0605  WBC 30.0* 21.6* 16.5*  HGB 14.6 13.5* 13.1  HCT 46.0 43.3* 38.9*  PLT  --   --  257    Recent Labs Lab 04/05/13 0605  NA 140  K 3.7  CL 104  CO2 27  BUN 10  CREATININE 0.77  CALCIUM 8.6  GLUCOSE 110*   BCx: NG at 24 hours   04/04/2013 14:40 04/05/2013 06:05  INR 2.18 1.38   Beverely Low, MD 04/05/2013, 9:52 AM PGY-1, [facility] FPTS Intern pager: [phone], text pages welcome

## 2013-04-05 NOTE — Progress Notes (Signed)
Pt doesn't require help from RT, pt has home CPAP unit in room.  RT to monitor and assess as needed.

## 2013-04-06 DIAGNOSIS — L02419 Cutaneous abscess of limb, unspecified: Secondary | ICD-10-CM | POA: Diagnosis not present

## 2013-04-06 DIAGNOSIS — J449 Chronic obstructive pulmonary disease, unspecified: Secondary | ICD-10-CM | POA: Diagnosis not present

## 2013-04-06 DIAGNOSIS — Z86718 Personal history of other venous thrombosis and embolism: Secondary | ICD-10-CM | POA: Diagnosis not present

## 2013-04-06 LAB — PROTIME-INR
INR: 1.55 — ABNORMAL HIGH (ref 0.00–1.49)
Prothrombin Time: 18.2 seconds — ABNORMAL HIGH (ref 11.6–15.2)

## 2013-04-06 MED ORDER — CLINDAMYCIN HCL 300 MG PO CAPS
450.0000 mg | ORAL_CAPSULE | Freq: Three times a day (TID) | ORAL | Status: DC
  Administered 2013-04-06 – 2013-04-07 (×2): 450 mg via ORAL
  Filled 2013-04-06 (×8): qty 1

## 2013-04-06 NOTE — Progress Notes (Signed)
ANTICOAGULATION CONSULT NOTE - Follow Up Consult  Pharmacy Consult for coumadin Indication: DVT  Allergies  Allergen Reactions  . Hydrocodone Itching and Other (See Comments)    Gives me chills  . Oxycodone Itching and Other (See Comments)    Gives me chills    Patient Measurements: Height: 5' 10.5" (179.1 cm) Weight: 350 lb 1.5 oz (158.8 kg) IBW/kg (Calculated) : 74.15   Vital Signs: Temp: 99.4 F (37.4 C) (12/03 0605) Temp src: Oral (12/03 0605) BP: 109/49 mmHg (12/03 0605) Pulse Rate: 75 (12/03 0605)  Labs:  Recent Labs  04/03/13 1830 04/04/13 0929 04/04/13 1440 04/05/13 0605 04/06/13 0413  HGB 14.6 13.5*  --  13.1  --   HCT 46.0 43.3*  --  38.9*  --   PLT  --   --   --  257  --   LABPROT  --   --  23.6* 16.6* 18.2*  INR  --   --  2.18* 1.38 1.55*  CREATININE  --   --   --  0.77  --     Estimated Creatinine Clearance: 136.9 ml/min (by C-G formula based on Cr of 0.77).   Medications:  Prescriptions prior to admission  Medication Sig Dispense Refill  . acetaminophen (TYLENOL) 500 MG tablet Take 1,000 mg by mouth every 6 (six) hours as needed for mild pain.      Marland Kitchen b complex vitamins tablet Take 1 tablet by mouth at bedtime.       . calcium carbonate (OS-CAL) 600 MG TABS Take 600 mg by mouth at bedtime.       . celecoxib (CELEBREX) 200 MG capsule Take 200 mg by mouth as needed.      . cholecalciferol (VITAMIN D) 1000 UNITS tablet Take 1,000 Units by mouth at bedtime.       . citalopram (CELEXA) 20 MG tablet Take 20 mg by mouth daily.      Marland Kitchen doxycycline (VIBRA-TABS) 100 MG tablet Take 100 mg by mouth 2 (two) times daily.      . Multiple Vitamin (MULTIVITAMIN) tablet Take 1 tablet by mouth at bedtime.       . sildenafil (VIAGRA) 100 MG tablet Take 1 tablet (100 mg total) by mouth daily as needed for erectile dysfunction. Splits the tablet in half prn  10 tablet  11  . warfarin (COUMADIN) 5 MG tablet Take 2.5-5 mg by mouth daily. Takes 2.5mg  on Thursday and  Sunday. Takes 5mg  all other days      . [DISCONTINUED] methocarbamol (ROBAXIN) 500 MG tablet Take 1 tablet (500 mg total) by mouth every 6 (six) hours as needed.  40 tablet  0    Assessment: 67 yo man to continue coumadin for h/o VTE.  INR was therapeutic on admit.  INR has increased today to 1.55 Goal of Therapy:  INR 2-3 Monitor platelets by anticoagulation protocol: Yes   Plan:  Continue coumadin as per home dose, 5 mg today. Cont daily PT/INR.  Pama Roskos Poteet 04/06/2013,9:51 AM

## 2013-04-06 NOTE — Progress Notes (Signed)
Family Medicine Teaching Service Daily Progress Note Intern Pager: [phone]  Patient name: Marc Mills Medical record number: [id number] Date of birth: 1945-05-31 Age: 67 y.o. Gender: male  Primary Care Provider: Lucilla Edin, MD Consultants: none Code Status: full  Pt Overview and Major Events to Date:  04/04/13 - Admitted with cellulitis  Assessment and Plan: Marc Mills is a 67 y.o. male presenting with cellulitis of left lower extremity that failed to respond to ceftriaxone and doxycyline. PMH is significant for splenectomy s/p lymphoma, right LE cellulitis in 2013, left total knee replacement in July 2014, and history DVT/PE requiring anticoagulation.   # Cellulitis LLE - Likely due to gram positive bacteria, presumably MRSA; Distribution in 3 areas: superior L knee 7.5 x 6 cm, L upper thigh 10 x 5 cm, entire anterior left lower leg; No evidence of joint involvement of left knee  - blood cultures NGTD   - transition to oral clinda today after 2pm dose of vancomycin - if any concern for progression to involve joint, consider arthrocentesis vs imaging and contact orthopedic surgeon Marc Mills)   # Hx of multiple PE - on chronic anticoagulation  - check INR and cont coumadin per pharm   # Depression  - cont home celexa   FEN/GI: Regular Diet, KVO IVF  Prophylaxis: on full dose anti-coagulation   Disposition: home pending clinical improvement and transition to oral antibiotics  Subjective: Feeling well this morning, no acute events overnight, Feels like is leg is better, less hot and tense, has been walking on it without difficulty  Objective: Temp:  [98.2 F (36.8 C)-99.4 F (37.4 C)] 99.4 F (37.4 C) (12/03 0605) Pulse Rate:  [75-82] 75 (12/03 0605) Resp:  [18] 18 (12/03 0605) BP: (109-123)/(49-69) 109/49 mmHg (12/03 0605) SpO2:  [95 %-98 %] 95 % (12/03 6962) Physical Exam: General: elderly WM, pleasant, obese, non distressed  HEENT: NCAT,  MMM Cardiovascular: RRR, no murmurs  Respiratory: normal work of breathing, clear to auscultation bilaterally  Abdomen: soft, NDNT Extremities: left knee without effusions, normal ROM  Skin: LLE with erythema and warmth in following distribution in 3 areas: superior L knee 7.5 x 6 cm, L upper thigh 10 x 5 cm, entire anterior left lower leg, has receded from the markings in several areas, much less hot and red  Neuro: alert and oriented x 3, no focal deficits   Laboratory:  Recent Labs Lab 04/03/13 1830 04/04/13 0929 04/05/13 0605  WBC 30.0* 21.6* 16.5*  HGB 14.6 13.5* 13.1  HCT 46.0 43.3* 38.9*  PLT  --   --  257    Recent Labs Lab 04/05/13 0605  NA 140  K 3.7  CL 104  CO2 27  BUN 10  CREATININE 0.77  CALCIUM 8.6  GLUCOSE 110*   BCx: NG at 24 hours   04/04/2013 14:40 04/05/2013 06:05 04/06/2013 04:13  INR 2.18 (H) 1.38 1.55 (H)  Beverely Low, MD 04/06/2013, 7:19 AM PGY-1, [facility] FPTS Intern pager: [phone], text pages welcome

## 2013-04-06 NOTE — Progress Notes (Signed)
FMTS Attending Daily Note:  Renold Don MD  [phone] pager  Family Practice pager:  [phone] I have seen and examined this patient and have reviewed their chart. I have discussed this patient with the resident. I agree with the resident's findings, assessment and care plan.  Additionally:  Much improved.  After second dose of Vanc today, can do trial of PO antibiotics and assess for improvement.  No joint involvement.  Tobey Grim, MD 04/06/2013 2:42 PM

## 2013-04-07 DIAGNOSIS — L02419 Cutaneous abscess of limb, unspecified: Secondary | ICD-10-CM | POA: Diagnosis not present

## 2013-04-07 DIAGNOSIS — Z9089 Acquired absence of other organs: Secondary | ICD-10-CM

## 2013-04-07 DIAGNOSIS — J449 Chronic obstructive pulmonary disease, unspecified: Secondary | ICD-10-CM | POA: Diagnosis not present

## 2013-04-07 DIAGNOSIS — Z87898 Personal history of other specified conditions: Secondary | ICD-10-CM

## 2013-04-07 DIAGNOSIS — Z86718 Personal history of other venous thrombosis and embolism: Secondary | ICD-10-CM | POA: Diagnosis not present

## 2013-04-07 LAB — PROTIME-INR
INR: 1.96 — ABNORMAL HIGH (ref 0.00–1.49)
Prothrombin Time: 21.7 seconds — ABNORMAL HIGH (ref 11.6–15.2)

## 2013-04-07 MED ORDER — SULFAMETHOXAZOLE-TMP DS 800-160 MG PO TABS: 2.0000 | ORAL_TABLET | Freq: Two times a day (BID) | ORAL | Status: DC

## 2013-04-07 MED ORDER — METHOCARBAMOL 500 MG PO TABS: 500.0000 mg | ORAL_TABLET | Freq: Four times a day (QID) | ORAL | Status: DC | PRN

## 2013-04-07 MED ORDER — CLINDAMYCIN HCL 150 MG PO CAPS: 450.0000 mg | ORAL_CAPSULE | Freq: Three times a day (TID) | ORAL | Status: DC

## 2013-04-07 NOTE — Progress Notes (Signed)
DC instructions gone over with patient, questions answered, verbalized understanding.  Patients Rx sent to his pharmacy.  Stressed to patient the importance of taking his antibiotics as prescribed for complete dose.  Patient verbalized understanding.  Patient transported to front of hospital via wheelchair accompanied by nurse tech to be taken home by wife.  Patient in good condition upon leaving [facility].

## 2013-04-07 NOTE — Progress Notes (Signed)
FMTS Attending Daily Note:  Renold Don MD  [phone] pager  Family Practice pager:  [phone] I have seen and examined this patient and have reviewed their chart. I have discussed this patient with the resident. I agree with the resident's findings, assessment and care plan.  Additionally:  Continues to improve off IV antibiotics.  Only thought is that Clindamycin doesn't cover MRSA as well as Bactrim in adults based on local sensitivities.  Switch to DS Bactrim x 10 days.    Tobey Grim, MD 04/07/2013 3:06 PM

## 2013-04-07 NOTE — Discharge Summary (Signed)
Family Medicine Teaching [facility] Discharge Summary  Patient name: Marc Mills Medical record number: [id number] Date of birth: 1945/10/04 Age: 67 y.o. Gender: male Date of Admission: 04/04/2013  Date of Discharge: 04/07/2013  Admitting Physician: Tobey Grim, MD  Primary Care Provider: Lucilla Edin, MD Consultants: none  Indication for Hospitalization: cellulitis  Discharge Diagnoses/Problem List:  Patient Active Problem List   Diagnosis Date Noted  . Cellulitis of left lower extremity 04/04/2013  . Osteoarthritis of left knee 11/02/2012  . COPD, mild 10/18/2012  . Cellulitis of right lower extremity 04/30/2012  . History of DVT of lower extremity 04/29/2012  . Status post splenectomy 04/29/2012  . Incisional hernia 05/27/2011  . LYMPHOMA, HX OF 04/16/2010  . MORBID OBESITY 04/19/2008  . OBSTRUCTIVE SLEEP APNEA 04/19/2008  . VARICOSE VEINS, LOWER EXTREMITIES 04/19/2008  . PULMONARY EMBOLISM, HX OF 04/19/2008    Disposition: Home  Discharge Condition: stable  Discharge Exam:  General: elderly WM, pleasant, obese, non distressed  HEENT: NCAT, MMM  Cardiovascular: RRR, no murmurs  Respiratory: normal work of breathing, clear to auscultation bilaterally  Abdomen: soft, NDNT  Extremities: left knee without effusions, normal ROM  Skin: LLE with erythema and warmth in following distribution in 3 areas: superior L knee 7.5 x 6 cm, L upper thigh 10 x 5 cm, entire anterior left lower leg, has receded from the markings in several areas, less hot and red  Neuro: alert and oriented x 3, no focal deficits  Brief Hospital Course: Marc Mills is a 67 y.o. male presenting with cellulitis of left lower extremity that failed to respond to ceftriaxone and doxycyline. PMH is significant for splenectomy s/p lymphoma, right LE cellulitis in 2013, left total knee replacement in July 2014, and history DVT/PE requiring anticoagulation.   # Cellulitis LLE: The patient  improved with 48 hours of IV vancomycin and was transitioned to po clindamycin. It remained stable for 24 hours on oral antibiotics and he was discharged with an additional 10 days of bactrim with close follow-up with Dr. [name] outpatient.   Issues for Follow Up: Resolution of redness, swelling. Any signs of systemic infection or joint involvement.  Significant Procedures: none  Significant Labs and Imaging:   Recent Labs Lab 04/03/13 1830 04/04/13 0929 04/05/13 0605  WBC 30.0* 21.6* 16.5*  HGB 14.6 13.5* 13.1  HCT 46.0 43.3* 38.9*  PLT  --   --  257    Recent Labs Lab 04/05/13 0605  NA 140  K 3.7  CL 104  CO2 27  GLUCOSE 110*  BUN 10  CREATININE 0.77  CALCIUM 8.6   BCx: NG at 72 hours    04/04/2013 14:40  04/05/2013 06:05  04/06/2013 04:13  04/07/2013 04:50   INR  2.18 (H)  1.38  1.55 (H)  1.96 (H)     Results/Tests Pending at Time of Discharge: Final blood cultures  Discharge Medications:    Medication List    STOP taking these medications       doxycycline 100 MG tablet  Commonly known as:  VIBRA-TABS      TAKE these medications       acetaminophen 500 MG tablet  Commonly known as:  TYLENOL  Take 1,000 mg by mouth every 6 (six) hours as needed for mild pain.     b complex vitamins tablet  Take 1 tablet by mouth at bedtime.     calcium carbonate 600 MG Tabs tablet  Commonly known as:  OS-CAL  Take  600 mg by mouth at bedtime.     celecoxib 200 MG capsule  Commonly known as:  CELEBREX  Take 200 mg by mouth as needed.     cholecalciferol 1000 UNITS tablet  Commonly known as:  VITAMIN D  Take 1,000 Units by mouth at bedtime.     citalopram 20 MG tablet  Commonly known as:  CELEXA  Take 20 mg by mouth daily.     methocarbamol 500 MG tablet  Commonly known as:  ROBAXIN  Take 1 tablet (500 mg total) by mouth every 6 (six) hours as needed.     multivitamin tablet  Take 1 tablet by mouth at bedtime.     sildenafil 100 MG tablet  Commonly known as:   VIAGRA  Take 1 tablet (100 mg total) by mouth daily as needed for erectile dysfunction. Splits the tablet in half prn     sulfamethoxazole-trimethoprim 800-160 MG per tablet  Commonly known as:  BACTRIM DS  Take 2 tablets by mouth 2 (two) times daily.     warfarin 5 MG tablet  Commonly known as:  COUMADIN  Take 2.5-5 mg by mouth daily. Takes 2.5mg  on Thursday and Sunday. Takes 5mg  all other days       Discharge Instructions: Please refer to Patient Instructions section of EMR for full details.  Patient was counseled important signs and symptoms that should prompt return to medical care, changes in medications, dietary instructions, activity restrictions, and follow up appointments.   Follow-Up Appointments:     Follow-up Information   Follow up with DAUB, STEVE A, MD. Schedule an appointment as soon as possible for a visit on 04/09/2013.   Specialty:  Family Medicine   Contact information:   [address] [place] [address] [phone]      Beverely Low, MD 04/07/2013, 4:50 PM PGY-1, [facility] Family [facility]

## 2013-04-07 NOTE — Progress Notes (Signed)
Family Medicine Teaching Service Daily Progress Note Intern Pager: [phone]  Patient name: Marc Mills Medical record number: [id number] Date of birth: Jun 18, 1945 Age: 67 y.o. Gender: male  Primary Care Provider: Lucilla Edin, MD Consultants: none Code Status: full  Pt Overview and Major Events to Date:  04/04/13 - Admitted with cellulitis  Assessment and Plan: Marc Mills is a 67 y.o. male presenting with cellulitis of left lower extremity that failed to respond to ceftriaxone and doxycyline. PMH is significant for splenectomy s/p lymphoma, right LE cellulitis in 2013, left total knee replacement in July 2014, and history DVT/PE requiring anticoagulation.   # Cellulitis LLE - Likely due to gram positive bacteria, presumably MRSA; Distribution in 3 areas: superior L knee 7.5 x 6 cm, L upper thigh 10 x 5 cm, entire anterior left lower leg; No evidence of joint involvement of left knee  - blood cultures NGTD   - home with an additional 10 days of bactrim - if any concern for progression to involve joint, consider arthrocentesis vs imaging and contact orthopedic surgeon Norlene Campbell)   # Hx of multiple PE - on chronic anticoagulation  - check INR and cont coumadin per pharm   # Depression  - cont home celexa   FEN/GI: Regular Diet, KVO IVF  Prophylaxis: on full dose anti-coagulation   Disposition: home, f/u with Dr. [name] on Saturday  Subjective: Feeling well this morning, no acute events overnight, Feels like is leg is better, less hot and tense, minimal pain, has been walking on it without difficulty  Objective: Temp:  [98 F (36.7 C)-98.6 F (37 C)] 98.6 F (37 C) (12/04 0512) Pulse Rate:  [71-73] 73 (12/03 2249) Resp:  [18-19] 18 (12/04 0512) BP: (121-124)/(63-73) 124/63 mmHg (12/04 0512) SpO2:  [98 %] 98 % (12/04 0512) Physical Exam: General: elderly WM, pleasant, obese, non distressed  HEENT: NCAT, MMM Cardiovascular: RRR, no murmurs  Respiratory:  normal work of breathing, clear to auscultation bilaterally  Abdomen: soft, NDNT Extremities: left knee without effusions, normal ROM  Skin: LLE with erythema and warmth in following distribution in 3 areas: superior L knee 7.5 x 6 cm, L upper thigh 10 x 5 cm, entire anterior left lower leg, has receded from the markings in several areas, much less hot and red  Neuro: alert and oriented x 3, no focal deficits   Laboratory:  Recent Labs Lab 04/03/13 1830 04/04/13 0929 04/05/13 0605  WBC 30.0* 21.6* 16.5*  HGB 14.6 13.5* 13.1  HCT 46.0 43.3* 38.9*  PLT  --   --  257    Recent Labs Lab 04/05/13 0605  NA 140  K 3.7  CL 104  CO2 27  BUN 10  CREATININE 0.77  CALCIUM 8.6  GLUCOSE 110*   BCx: NG at 24 hours   04/04/2013 14:40 04/05/2013 06:05 04/06/2013 04:13 04/07/2013 04:50  INR 2.18 (H) 1.38 1.55 (H) 1.96 (H)   Beverely Low, MD 04/07/2013, 1:23 PM PGY-1, [facility] Family Medicine FPTS Intern pager: [phone], text pages welcome

## 2013-04-08 NOTE — Discharge Summary (Signed)
Family Medicine Teaching Service  Discharge Note : Attending Jeff Tahiri Shareef MD Pager [phone] Inpatient Team Pager:  [phone]  I have reviewed this patient and the patient's chart and have discussed discharge planning with the resident at the time of discharge. I agree with the discharge plan as above.    

## 2013-04-10 ENCOUNTER — Ambulatory Visit (INDEPENDENT_AMBULATORY_CARE_PROVIDER_SITE_OTHER): Payer: Medicare Other | Admitting: Emergency Medicine

## 2013-04-10 VITALS — BP 138/80 | HR 75 | Temp 98.2°F | Resp 18 | Ht 70.5 in | Wt 344.0 lb

## 2013-04-10 DIAGNOSIS — L02419 Cutaneous abscess of limb, unspecified: Secondary | ICD-10-CM | POA: Diagnosis not present

## 2013-04-10 DIAGNOSIS — L03119 Cellulitis of unspecified part of limb: Secondary | ICD-10-CM | POA: Diagnosis not present

## 2013-04-10 LAB — POCT CBC
Granulocyte percent: 72.9 %G (ref 37–80)
HCT, POC: 42.6 % — AB (ref 43.5–53.7)
Hemoglobin: 13.4 g/dL — AB (ref 14.1–18.1)
Lymph, poc: 1.9 (ref 0.6–3.4)
MCH, POC: 29.1 pg (ref 27–31.2)
MCHC: 31.5 g/dL — AB (ref 31.8–35.4)
MCV: 92.7 fL (ref 80–97)
MID (cbc): 0.9 (ref 0–0.9)
MPV: 9.9 fL (ref 0–99.8)
POC Granulocyte: 7.7 — AB (ref 2–6.9)
POC LYMPH PERCENT: 18.3 %L (ref 10–50)
POC MID %: 8.8 %M (ref 0–12)
Platelet Count, POC: 427 10*3/uL — AB (ref 142–424)
RBC: 4.6 M/uL — AB (ref 4.69–6.13)
RDW, POC: 15.4 %
WBC: 10.6 10*3/uL — AB (ref 4.6–10.2)

## 2013-04-10 LAB — CULTURE, BLOOD (ROUTINE X 2)
Culture: NO GROWTH
Culture: NO GROWTH

## 2013-04-10 NOTE — Patient Instructions (Signed)
Please return to clinic on Tuesday at 12:00 in the appointment Center. Please call me if you develope more fever or redness involving your thigh.

## 2013-04-10 NOTE — Progress Notes (Addendum)
Subjective:    Patient ID: Marc Mills, male    DOB: 02-14-1946, 67 y.o.   MRN: [id number] This chart was scribed for Lesle Chris, MD by Danella Maiers, ED Scribe. This patient was seen in room 14 and the patient's care was started at 2:55 PM.  Chief Complaint  Patient presents with  . Follow-up    f/u w/ Dr. [name]- cellulitis of leg/leukocytosis   HPI HPI Comments: CAELIN RAYL is a 67 y.o. male who presents to the [facility] for a f/u after being in the hospital for cellulitis of left leg. He states it feels much better but the redness has worsened since he was discharged and feels like a sunburn. He states the upper half of the leg has gotten better but the lower half of the leg has gotten worse. He states the last time he had this it was also very red, then blistered and peeled. He is otherwise healthy and feeling well.  Patient Active Problem List   Diagnosis Date Noted  . Cellulitis of left lower extremity 04/04/2013  . Osteoarthritis of left knee 11/02/2012  . COPD, mild 10/18/2012  . Cellulitis of right lower extremity 04/30/2012  . History of DVT of lower extremity 04/29/2012  . Status post splenectomy 04/29/2012  . Incisional hernia 05/27/2011  . LYMPHOMA, HX OF 04/16/2010  . MORBID OBESITY 04/19/2008  . OBSTRUCTIVE SLEEP APNEA 04/19/2008  . VARICOSE VEINS, LOWER EXTREMITIES 04/19/2008  . PULMONARY EMBOLISM, HX OF 04/19/2008   Past Medical History  Diagnosis Date  . Morbid obesity   . History of pulmonary embolus (PE)   . History of lymphoma   . Incisional hernia   . Arthritis   . Blood transfusion without reported diagnosis   . DVT (deep venous thrombosis)     2011 right leg  . Anorectal fistula   . OSA on CPAP     cpap  10 yrs  . Varicose veins of lower extremities     pe  . Cancer     lymphoma hx  . Cataract   . Cellulitis 04/04/2013   Past Surgical History  Procedure Laterality Date  . Splenectomy    . Prostate  surgery      (patient denies)  . Ivc filter    . Tonsillectomy      age 31  . Eye surgery Left 03    cat, detached ret    . Hernia repair    . Total knee arthroplasty Left 11/02/2012    Procedure: TOTAL KNEE ARTHROPLASTY with revision tibia;  Surgeon: Valeria Batman, MD;  Location: [facility] OR;  Service: Orthopedics;  Laterality: Left;  . Fracture surgery     Allergies  Allergen Reactions  . Hydrocodone Itching and Other (See Comments)    Gives me chills  . Oxycodone Itching and Other (See Comments)    Gives me chills   Prior to Admission medications   Medication Sig Start Date End Date Taking? Authorizing Provider  acetaminophen (TYLENOL) 500 MG tablet Take 1,000 mg by mouth every 6 (six) hours as needed for mild pain.   Yes Historical Provider, MD  b complex vitamins tablet Take 1 tablet by mouth at bedtime.    Yes Historical Provider, MD  calcium carbonate (OS-CAL) 600 MG TABS Take 600 mg by mouth at bedtime.    Yes Historical Provider, MD  cholecalciferol (VITAMIN D) 1000 UNITS tablet Take 1,000 Units by mouth at bedtime.    Yes  Historical Provider, MD  citalopram (CELEXA) 20 MG tablet Take 20 mg by mouth daily.   Yes Historical Provider, MD  Multiple Vitamin (MULTIVITAMIN) tablet Take 1 tablet by mouth at bedtime.    Yes Historical Provider, MD  sildenafil (VIAGRA) 100 MG tablet Take 1 tablet (100 mg total) by mouth daily as needed for erectile dysfunction. Splits the tablet in half prn 03/21/13  Yes Collene Gobble, MD  sulfamethoxazole-trimethoprim (BACTRIM DS) 800-160 MG per tablet Take 2 tablets by mouth 2 (two) times daily. 04/07/13  Yes Beverely Low, MD  warfarin (COUMADIN) 5 MG tablet Take 2.5-5 mg by mouth daily. Takes 2.5mg  on Thursday and Sunday. Takes 5mg  all other days   Yes Historical Provider, MD  celecoxib (CELEBREX) 200 MG capsule Take 200 mg by mouth as needed.    Historical Provider, MD  methocarbamol (ROBAXIN) 500 MG tablet Take 1 tablet (500 mg total) by mouth every 6  (six) hours as needed. 04/07/13   Beverely Low, MD   History  Substance Use Topics  . Smoking status: Former Smoker -- 2.00 packs/day for 20 years    Types: Cigarettes    Quit date: 05/05/1984  . Smokeless tobacco: Never Used  . Alcohol Use: No     Review of Systems  Cardiovascular: Positive for leg swelling.  Skin: Positive for rash (redness).       Objective:   Physical Exam CONSTITUTIONAL: Well developed/well nourished HEAD: Normocephalic/atraumatic EYES: EOMI/PERRL ENMT: Mucous membranes moist NECK: supple no meningeal signs SPINE:entire spine nontender CV: S1/S2 noted, no murmurs/rubs/gallops noted LUNGS: Lungs are clear to auscultation bilaterally, no apparent distress ABDOMEN: soft, nontender, no rebound or guarding GU:no cva tenderness NEURO: Pt is awake/alert, moves all extremitiesx4 EXTREMITIES: There is confluent redness involving the entire right leg but sparing the calf. There is mild redness over the foot pulses were maintained he has significant varicosities bilaterally.  SKIN: warm, color normal PSYCH: no abnormalities of mood noted Results for orders placed in visit on 04/10/13  POCT CBC      Result Value Range   WBC 10.6 (*) 4.6 - 10.2 K/uL   Lymph, poc 1.9  0.6 - 3.4   POC LYMPH PERCENT 18.3  10 - 50 %L   MID (cbc) 0.9  0 - 0.9   POC MID % 8.8  0 - 12 %M   POC Granulocyte 7.7 (*) 2 - 6.9   Granulocyte percent 72.9  37 - 80 %G   RBC 4.60 (*) 4.69 - 6.13 M/uL   Hemoglobin 13.4 (*) 14.1 - 18.1 g/dL   HCT, POC 16.1 (*) 09.6 - 53.7 %   MCV 92.7  80 - 97 fL   MCH, POC 29.1  27 - 31.2 pg   MCHC 31.5 (*) 31.8 - 35.4 g/dL   RDW, POC 04.5     Platelet Count, POC 427 (*) 142 - 424 K/uL   MPV 9.9  0 - 99.8 fL    Filed Vitals:   04/10/13 1446  BP: 138/80  Pulse: 75  Temp: 98.2 F (36.8 C)  TempSrc: Oral  Resp: 18  Height: 5' 10.5" (1.791 m)  Weight: 344 lb (156.037 kg)  SpO2: 98%      Assessment & Plan:  His leg is more red but he did get into  a hot of yesterday. He was able to swim today without difficulty. He has had progressive redness involving his leg. He feels fine his white count is better and he is afebrile today.  We'll stay the course for now and recheck in 48 hours. I personally performed the services described in this documentation, which was scribed in my presence. The recorded information has been reviewed and is accurate.

## 2013-04-11 ENCOUNTER — Ambulatory Visit (INDEPENDENT_AMBULATORY_CARE_PROVIDER_SITE_OTHER): Payer: Medicare Other | Admitting: Pharmacist

## 2013-04-11 DIAGNOSIS — Z792 Long term (current) use of antibiotics: Secondary | ICD-10-CM

## 2013-04-11 DIAGNOSIS — Z86718 Personal history of other venous thrombosis and embolism: Secondary | ICD-10-CM

## 2013-04-11 LAB — POCT INR: INR: 2.6

## 2013-04-12 ENCOUNTER — Ambulatory Visit (INDEPENDENT_AMBULATORY_CARE_PROVIDER_SITE_OTHER): Payer: Medicare Other | Admitting: Emergency Medicine

## 2013-04-12 ENCOUNTER — Encounter: Payer: Self-pay | Admitting: Emergency Medicine

## 2013-04-12 VITALS — BP 154/80 | HR 72 | Temp 97.8°F | Resp 16 | Ht 70.5 in | Wt 351.0 lb

## 2013-04-12 DIAGNOSIS — L02419 Cutaneous abscess of limb, unspecified: Secondary | ICD-10-CM

## 2013-04-12 DIAGNOSIS — L03119 Cellulitis of unspecified part of limb: Secondary | ICD-10-CM | POA: Diagnosis not present

## 2013-04-12 NOTE — Progress Notes (Signed)
   Subjective:    Patient ID: Marc Mills, male    DOB: 10-18-1945, 67 y.o.   MRN: [id number]  HPI patient in to recheck cellulitis of his leg. He is going to the Coumadin clinic and his INR is in range. He's currently on Septra DS 2 twice a day. He states he feels fine. He denies fevers chills or sweats. He feels the area of redness has decreased in size.    Review of Systems     Objective:   Physical Exam there does appear to be some fading in the redness over his left lower leg. The calf circumference measured 17 cm below the inferior border of the patella was 54 cm. There appears to be less calf swelling        Assessment & Plan:  For cellulitis of the left leg slow to resolve. No change in medication at the present time. Recheck in one week. If he develops fevers chills or becomes ill he is to present himself to the emergency room.

## 2013-04-17 ENCOUNTER — Other Ambulatory Visit: Payer: Self-pay | Admitting: *Deleted

## 2013-04-17 MED ORDER — SULFAMETHOXAZOLE-TMP DS 800-160 MG PO TABS: 2.0000 | ORAL_TABLET | Freq: Two times a day (BID) | ORAL | Status: DC

## 2013-04-18 ENCOUNTER — Ambulatory Visit (INDEPENDENT_AMBULATORY_CARE_PROVIDER_SITE_OTHER): Payer: Medicare Other | Admitting: *Deleted

## 2013-04-18 DIAGNOSIS — Z86718 Personal history of other venous thrombosis and embolism: Secondary | ICD-10-CM

## 2013-04-18 DIAGNOSIS — Z792 Long term (current) use of antibiotics: Secondary | ICD-10-CM

## 2013-04-18 LAB — POCT INR: INR: 3.5

## 2013-04-19 ENCOUNTER — Ambulatory Visit (INDEPENDENT_AMBULATORY_CARE_PROVIDER_SITE_OTHER): Payer: Medicare Other | Admitting: Emergency Medicine

## 2013-04-19 ENCOUNTER — Encounter: Payer: Self-pay | Admitting: Emergency Medicine

## 2013-04-19 ENCOUNTER — Ambulatory Visit: Payer: Medicare Other | Admitting: Emergency Medicine

## 2013-04-19 VITALS — BP 140/70 | HR 75 | Temp 97.9°F | Resp 16 | Ht 71.0 in | Wt 355.0 lb

## 2013-04-19 DIAGNOSIS — L0291 Cutaneous abscess, unspecified: Secondary | ICD-10-CM

## 2013-04-19 DIAGNOSIS — L0390 Cellulitis, unspecified: Secondary | ICD-10-CM

## 2013-04-19 MED ORDER — SULFAMETHOXAZOLE-TMP DS 800-160 MG PO TABS: 2.0000 | ORAL_TABLET | Freq: Two times a day (BID) | ORAL | Status: DC

## 2013-04-19 NOTE — Progress Notes (Signed)
   Subjective:    Patient ID: Marc Mills, male    DOB: December 07, 1945, 67 y.o.   MRN: [id number]  HPI his left calf is feeling significantly better. He has decreased redness decreased swelling. He has not had any fever. He continues on Septra DS 2 twice a day.    Review of Systems     Objective:   Physical Exam there is peeling of the skin over the left leg. There is a hint of redness which extends from 3 inches below the patella to the ankle. There is been a significant decrease in the swelling       Assessment & Plan:  Cellulitis of the left calf is significantly better on Septra. He will continue the Septra for now. He currently is on a dosage of 2 twice a day and tolerating it well. He is also taking his probiotics. Will recheck in about 2 weeks.

## 2013-04-25 ENCOUNTER — Ambulatory Visit (INDEPENDENT_AMBULATORY_CARE_PROVIDER_SITE_OTHER): Payer: Medicare Other | Admitting: Pharmacist

## 2013-04-25 DIAGNOSIS — Z792 Long term (current) use of antibiotics: Secondary | ICD-10-CM

## 2013-04-25 DIAGNOSIS — Z86718 Personal history of other venous thrombosis and embolism: Secondary | ICD-10-CM | POA: Diagnosis not present

## 2013-04-25 LAB — POCT INR: INR: 2.2

## 2013-04-29 ENCOUNTER — Ambulatory Visit (INDEPENDENT_AMBULATORY_CARE_PROVIDER_SITE_OTHER): Payer: Medicare Other | Admitting: Emergency Medicine

## 2013-04-29 VITALS — BP 122/70 | HR 73 | Temp 98.1°F | Resp 16 | Ht 71.0 in | Wt 355.0 lb

## 2013-04-29 DIAGNOSIS — L0390 Cellulitis, unspecified: Secondary | ICD-10-CM

## 2013-04-29 DIAGNOSIS — L0291 Cutaneous abscess, unspecified: Secondary | ICD-10-CM

## 2013-04-29 MED ORDER — SULFAMETHOXAZOLE-TMP DS 800-160 MG PO TABS: 2.0000 | ORAL_TABLET | Freq: Two times a day (BID) | ORAL | Status: DC

## 2013-04-29 NOTE — Progress Notes (Signed)
   Subjective:    Patient ID: Marc Mills, male    DOB: 1946-03-19, 67 y.o.   MRN: [id number]  HPI 67 year old male presents with recheck of cellulitis of left calf.  It is feeling significantly better. He has decreased redness decreased swelling. He has not had any fever.  No diarrhea.   Patient does not have a spleen.  Initially, with this episode, woke up feeling great, then out of nowhere felt tired and had a fever, came into clinic that day 11/30.  Took antibiotics for 2 weeks with prior episode of cellulitis, cellulitis of right leg  Will continue antibiotics x 1 week Review of Systems     Objective:   Physical Exam There is a resolving abrasion over his forearm. The area of redness above his patella is decreased. There is no significant redness over the left lower leg. He has underlying severe venous stasis disease with stasis dermatitis.       Assessment & Plan:  Will treat with one more week of Septra. He is to keep some Septra on hand in case he has an emergency spike in temperature

## 2013-05-06 ENCOUNTER — Telehealth: Payer: Self-pay | Admitting: Radiology

## 2013-05-06 ENCOUNTER — Other Ambulatory Visit: Payer: Self-pay | Admitting: Emergency Medicine

## 2013-05-06 MED ORDER — GUAIFENESIN ER 1200 MG PO TB12: 1.0000 | ORAL_TABLET | Freq: Two times a day (BID) | ORAL | Status: DC | PRN

## 2013-05-06 MED ORDER — AZITHROMYCIN 250 MG PO TABS: ORAL_TABLET | ORAL | Status: DC

## 2013-05-06 NOTE — Telephone Encounter (Signed)
Patient still coughing Dr Everlene Farrier wants him to have a Zpack. Also sent in plain Mucinex for him.  He is on coumadin Dr Everlene Farrier wants to make sure coumadin clinic is aware, he did finish his bactrim yesterday.

## 2013-05-09 ENCOUNTER — Ambulatory Visit (INDEPENDENT_AMBULATORY_CARE_PROVIDER_SITE_OTHER): Payer: Medicare Other | Admitting: *Deleted

## 2013-05-09 DIAGNOSIS — Z792 Long term (current) use of antibiotics: Secondary | ICD-10-CM | POA: Diagnosis not present

## 2013-05-09 DIAGNOSIS — Z86718 Personal history of other venous thrombosis and embolism: Secondary | ICD-10-CM | POA: Diagnosis not present

## 2013-05-09 LAB — POCT INR: INR: 1.5

## 2013-05-10 DIAGNOSIS — D235 Other benign neoplasm of skin of trunk: Secondary | ICD-10-CM | POA: Diagnosis not present

## 2013-05-10 DIAGNOSIS — L821 Other seborrheic keratosis: Secondary | ICD-10-CM | POA: Diagnosis not present

## 2013-05-13 ENCOUNTER — Other Ambulatory Visit: Payer: Self-pay

## 2013-05-13 DIAGNOSIS — N529 Male erectile dysfunction, unspecified: Secondary | ICD-10-CM

## 2013-05-13 MED ORDER — SILDENAFIL CITRATE 100 MG PO TABS: 100.0000 mg | ORAL_TABLET | Freq: Every day | ORAL | Status: DC | PRN

## 2013-05-19 ENCOUNTER — Ambulatory Visit (INDEPENDENT_AMBULATORY_CARE_PROVIDER_SITE_OTHER): Payer: Medicare Other

## 2013-05-19 DIAGNOSIS — Z86718 Personal history of other venous thrombosis and embolism: Secondary | ICD-10-CM | POA: Diagnosis not present

## 2013-05-19 DIAGNOSIS — Z792 Long term (current) use of antibiotics: Secondary | ICD-10-CM

## 2013-05-19 LAB — POCT INR: INR: 2.1

## 2013-06-03 ENCOUNTER — Telehealth: Payer: Self-pay | Admitting: Emergency Medicine

## 2013-06-03 ENCOUNTER — Other Ambulatory Visit: Payer: Self-pay | Admitting: Emergency Medicine

## 2013-06-03 DIAGNOSIS — L0390 Cellulitis, unspecified: Secondary | ICD-10-CM

## 2013-06-03 MED ORDER — CLINDAMYCIN HCL 300 MG PO CAPS: 300.0000 mg | ORAL_CAPSULE | Freq: Three times a day (TID) | ORAL | Status: DC

## 2013-06-03 NOTE — Telephone Encounter (Signed)
I received a phone call last night the patient has sustained abrasions to both legs after falling on a treadmill. Will treat with soap and water cleaning Bactroban ointment and I called in 10 days of Cleocin 300 mg to take 3 times a day.

## 2013-06-06 ENCOUNTER — Other Ambulatory Visit (HOSPITAL_BASED_OUTPATIENT_CLINIC_OR_DEPARTMENT_OTHER): Payer: Medicare Other

## 2013-06-06 ENCOUNTER — Encounter: Payer: Self-pay | Admitting: Oncology

## 2013-06-06 DIAGNOSIS — C8587 Other specified types of non-Hodgkin lymphoma, spleen: Secondary | ICD-10-CM | POA: Diagnosis not present

## 2013-06-06 DIAGNOSIS — Z87898 Personal history of other specified conditions: Secondary | ICD-10-CM

## 2013-06-06 DIAGNOSIS — C8589 Other specified types of non-Hodgkin lymphoma, extranodal and solid organ sites: Secondary | ICD-10-CM | POA: Diagnosis not present

## 2013-06-06 LAB — LACTATE DEHYDROGENASE (CC13): LDH: 116 U/L — ABNORMAL LOW (ref 125–245)

## 2013-06-06 LAB — CBC WITH DIFFERENTIAL/PLATELET
BASO%: 0.9 % (ref 0.0–2.0)
Basophils Absolute: 0.1 10*3/uL (ref 0.0–0.1)
EOS%: 9.6 % — ABNORMAL HIGH (ref 0.0–7.0)
Eosinophils Absolute: 0.6 10*3/uL — ABNORMAL HIGH (ref 0.0–0.5)
HCT: 40.7 % (ref 38.4–49.9)
HGB: 13.7 g/dL (ref 13.0–17.1)
LYMPH%: 15 % (ref 14.0–49.0)
MCH: 30.5 pg (ref 27.2–33.4)
MCHC: 33.6 g/dL (ref 32.0–36.0)
MCV: 90.8 fL (ref 79.3–98.0)
MONO#: 1 10*3/uL — ABNORMAL HIGH (ref 0.1–0.9)
MONO%: 14.9 % — ABNORMAL HIGH (ref 0.0–14.0)
NEUT#: 4 10*3/uL (ref 1.5–6.5)
NEUT%: 59.6 % (ref 39.0–75.0)
Platelets: 269 10*3/uL (ref 140–400)
RBC: 4.48 10*6/uL (ref 4.20–5.82)
RDW: 15.5 % — ABNORMAL HIGH (ref 11.0–14.6)
WBC: 6.7 10*3/uL (ref 4.0–10.3)
lymph#: 1 10*3/uL (ref 0.9–3.3)

## 2013-06-06 LAB — COMPREHENSIVE METABOLIC PANEL (CC13)
ALT: 19 U/L (ref 0–55)
AST: 18 U/L (ref 5–34)
Albumin: 3.8 g/dL (ref 3.5–5.0)
Alkaline Phosphatase: 58 U/L (ref 40–150)
Anion Gap: 11 mEq/L (ref 3–11)
BUN: 16 mg/dL (ref 7.0–26.0)
CO2: 25 mEq/L (ref 22–29)
Calcium: 9.5 mg/dL (ref 8.4–10.4)
Chloride: 104 mEq/L (ref 98–109)
Creatinine: 0.7 mg/dL (ref 0.7–1.3)
Glucose: 109 mg/dl (ref 70–140)
Potassium: 4.7 mEq/L (ref 3.5–5.1)
Sodium: 139 mEq/L (ref 136–145)
Total Bilirubin: 0.35 mg/dL (ref 0.20–1.20)
Total Protein: 6.9 g/dL (ref 6.4–8.3)

## 2013-06-07 LAB — BETA 2 MICROGLOBULIN, SERUM: Beta-2 Microglobulin: 1.68 mg/L (ref 1.01–1.73)

## 2013-06-08 ENCOUNTER — Other Ambulatory Visit: Payer: Self-pay | Admitting: Oncology

## 2013-06-09 ENCOUNTER — Ambulatory Visit (INDEPENDENT_AMBULATORY_CARE_PROVIDER_SITE_OTHER): Payer: Medicare Other

## 2013-06-09 DIAGNOSIS — Z5181 Encounter for therapeutic drug level monitoring: Secondary | ICD-10-CM

## 2013-06-09 DIAGNOSIS — Z792 Long term (current) use of antibiotics: Secondary | ICD-10-CM

## 2013-06-09 DIAGNOSIS — Z86718 Personal history of other venous thrombosis and embolism: Secondary | ICD-10-CM | POA: Diagnosis not present

## 2013-06-09 LAB — POCT INR: INR: 2.2

## 2013-07-06 ENCOUNTER — Ambulatory Visit (HOSPITAL_COMMUNITY)
Admission: RE | Admit: 2013-07-06 | Discharge: 2013-07-06 | Disposition: A | Discharge status: Home / Self Care | Payer: Medicare Other | Source: Ambulatory Visit | Attending: Emergency Medicine | Admitting: Emergency Medicine

## 2013-07-06 ENCOUNTER — Other Ambulatory Visit: Payer: Self-pay | Admitting: Oncology

## 2013-07-06 ENCOUNTER — Telehealth: Payer: Self-pay | Admitting: Emergency Medicine

## 2013-07-06 ENCOUNTER — Other Ambulatory Visit: Payer: Self-pay | Admitting: *Deleted

## 2013-07-06 ENCOUNTER — Other Ambulatory Visit: Payer: Self-pay | Admitting: Emergency Medicine

## 2013-07-06 ENCOUNTER — Other Ambulatory Visit: Payer: Self-pay | Admitting: Radiology

## 2013-07-06 ENCOUNTER — Telehealth: Payer: Self-pay | Admitting: *Deleted

## 2013-07-06 DIAGNOSIS — C8299 Follicular lymphoma, unspecified, extranodal and solid organ sites: Secondary | ICD-10-CM

## 2013-07-06 DIAGNOSIS — Z86718 Personal history of other venous thrombosis and embolism: Secondary | ICD-10-CM

## 2013-07-06 DIAGNOSIS — L02419 Cutaneous abscess of limb, unspecified: Secondary | ICD-10-CM | POA: Insufficient documentation

## 2013-07-06 DIAGNOSIS — Z87898 Personal history of other specified conditions: Secondary | ICD-10-CM

## 2013-07-06 DIAGNOSIS — L03119 Cellulitis of unspecified part of limb: Secondary | ICD-10-CM | POA: Diagnosis not present

## 2013-07-06 DIAGNOSIS — M79609 Pain in unspecified limb: Secondary | ICD-10-CM

## 2013-07-06 DIAGNOSIS — C8590 Non-Hodgkin lymphoma, unspecified, unspecified site: Secondary | ICD-10-CM

## 2013-07-06 DIAGNOSIS — M25569 Pain in unspecified knee: Secondary | ICD-10-CM

## 2013-07-06 DIAGNOSIS — L03116 Cellulitis of left lower limb: Secondary | ICD-10-CM

## 2013-07-06 DIAGNOSIS — I8390 Asymptomatic varicose veins of unspecified lower extremity: Secondary | ICD-10-CM

## 2013-07-06 DIAGNOSIS — M7989 Other specified soft tissue disorders: Secondary | ICD-10-CM

## 2013-07-06 NOTE — Progress Notes (Signed)
VASCULAR LAB PRELIMINARY  PRELIMINARY  PRELIMINARY  PRELIMINARY  Right lower extremity venous duplex completed.    Preliminary report: There is an area of thrombosis in a superficial vein coming off the popliteal vein.  This appears to be chronic.  No other Deep or superficial vein thrombosis noted in the right leg.  There is an enlarged lymph node seen in the inguinal area.   Annahi Short, RVT 07/06/2013, 3:56 PM

## 2013-07-06 NOTE — Telephone Encounter (Signed)
Pt notified of his appt and was on his way there when I spoke with him 25 min ago

## 2013-07-06 NOTE — Telephone Encounter (Signed)
Phone call the patient is having pain and swelling in his right calf. He is currently on Coumadin. We'll go ahead and schedule him Doppler of his right leg

## 2013-07-06 NOTE — Telephone Encounter (Signed)
Scheduled patient for STAT PET scan for 07/12/13 at 845. Called to speak with patient after Dr Jana Hakim received a call from Dr Everlene Farrier noted increase in inguinal nodes. Spoke with patient wife, [name], she is aware of today's issues and will be glad to give appt time to "[place]". Informed wife that we will also be calling with appt date and time for Dr Jana Hakim to review results.

## 2013-07-07 ENCOUNTER — Telehealth: Payer: Self-pay | Admitting: *Deleted

## 2013-07-07 NOTE — Telephone Encounter (Signed)
sw pt gv appt for labs on 07/11/13 @ 9:15am and ov on 07/13/13@ 8am. Pt is aware of his appts. i printed the order for MD to add the 8am appt....td

## 2013-07-09 ENCOUNTER — Telehealth: Payer: Self-pay | Admitting: Emergency Medicine

## 2013-07-09 NOTE — Telephone Encounter (Signed)
Leg is red and swollen. Workup next week with oncologist.History of cellulitis. Started on Septra DS 2 BID.Rx. Sent to [facility] yesterday.

## 2013-07-11 ENCOUNTER — Other Ambulatory Visit: Payer: Self-pay | Admitting: Oncology

## 2013-07-11 ENCOUNTER — Other Ambulatory Visit (HOSPITAL_BASED_OUTPATIENT_CLINIC_OR_DEPARTMENT_OTHER): Payer: Medicare Other

## 2013-07-11 DIAGNOSIS — Z7901 Long term (current) use of anticoagulants: Secondary | ICD-10-CM

## 2013-07-11 DIAGNOSIS — C8587 Other specified types of non-Hodgkin lymphoma, spleen: Secondary | ICD-10-CM

## 2013-07-11 DIAGNOSIS — C8299 Follicular lymphoma, unspecified, extranodal and solid organ sites: Secondary | ICD-10-CM

## 2013-07-11 LAB — CBC WITH DIFFERENTIAL/PLATELET
BASO%: 1.2 % (ref 0.0–2.0)
Basophils Absolute: 0.1 10*3/uL (ref 0.0–0.1)
EOS%: 6.8 % (ref 0.0–7.0)
Eosinophils Absolute: 0.4 10*3/uL (ref 0.0–0.5)
HCT: 40 % (ref 38.4–49.9)
HGB: 13.3 g/dL (ref 13.0–17.1)
LYMPH%: 19.6 % (ref 14.0–49.0)
MCH: 30.1 pg (ref 27.2–33.4)
MCHC: 33.3 g/dL (ref 32.0–36.0)
MCV: 90.4 fL (ref 79.3–98.0)
MONO#: 1 10*3/uL — ABNORMAL HIGH (ref 0.1–0.9)
MONO%: 16.3 % — ABNORMAL HIGH (ref 0.0–14.0)
NEUT#: 3.5 10*3/uL (ref 1.5–6.5)
NEUT%: 56.1 % (ref 39.0–75.0)
Platelets: 374 10*3/uL (ref 140–400)
RBC: 4.43 10*6/uL (ref 4.20–5.82)
RDW: 14.3 % (ref 11.0–14.6)
WBC: 6.2 10*3/uL (ref 4.0–10.3)
lymph#: 1.2 10*3/uL (ref 0.9–3.3)

## 2013-07-11 LAB — COMPREHENSIVE METABOLIC PANEL (CC13)
ALT: 19 U/L (ref 0–55)
AST: 14 U/L (ref 5–34)
Albumin: 3.5 g/dL (ref 3.5–5.0)
Alkaline Phosphatase: 58 U/L (ref 40–150)
Anion Gap: 9 mEq/L (ref 3–11)
BUN: 15 mg/dL (ref 7.0–26.0)
CO2: 26 mEq/L (ref 22–29)
Calcium: 9.4 mg/dL (ref 8.4–10.4)
Chloride: 106 mEq/L (ref 98–109)
Creatinine: 0.8 mg/dL (ref 0.7–1.3)
Glucose: 117 mg/dl (ref 70–140)
Potassium: 4.4 mEq/L (ref 3.5–5.1)
Sodium: 140 mEq/L (ref 136–145)
Total Bilirubin: 0.22 mg/dL (ref 0.20–1.20)
Total Protein: 6.9 g/dL (ref 6.4–8.3)

## 2013-07-11 LAB — LACTATE DEHYDROGENASE (CC13): LDH: 111 U/L — ABNORMAL LOW (ref 125–245)

## 2013-07-12 ENCOUNTER — Encounter (HOSPITAL_COMMUNITY): Payer: Self-pay

## 2013-07-12 ENCOUNTER — Encounter (HOSPITAL_COMMUNITY)
Admission: RE | Admit: 2013-07-12 | Discharge: 2013-07-12 | Disposition: A | Discharge status: Home / Self Care | Payer: Medicare Other | Source: Ambulatory Visit | Attending: Oncology | Admitting: Oncology

## 2013-07-12 DIAGNOSIS — Z87898 Personal history of other specified conditions: Secondary | ICD-10-CM | POA: Insufficient documentation

## 2013-07-12 DIAGNOSIS — C8589 Other specified types of non-Hodgkin lymphoma, extranodal and solid organ sites: Secondary | ICD-10-CM | POA: Diagnosis not present

## 2013-07-12 LAB — GLUCOSE, CAPILLARY: Glucose-Capillary: 91 mg/dL (ref 70–99)

## 2013-07-12 MED ORDER — SODIUM IODIDE I 131 CAPSULE: 12.0000 | Freq: Once | INTRAVENOUS | Status: DC | PRN

## 2013-07-12 MED ORDER — FLUDEOXYGLUCOSE F - 18 (FDG) INJECTION
15.0000 | Freq: Once | INTRAVENOUS | Status: AC | PRN
  Administered 2013-07-12: 15 via INTRAVENOUS

## 2013-07-12 MED ORDER — SODIUM PERTECHNETATE TC 99M INJECTION: 10.1000 | Freq: Once | INTRAVENOUS | Status: DC | PRN

## 2013-07-13 ENCOUNTER — Telehealth: Payer: Self-pay | Admitting: Oncology

## 2013-07-13 ENCOUNTER — Ambulatory Visit (HOSPITAL_BASED_OUTPATIENT_CLINIC_OR_DEPARTMENT_OTHER): Payer: Medicare Other | Admitting: Oncology

## 2013-07-13 VITALS — BP 139/83 | HR 69 | Temp 97.9°F | Resp 18 | Ht 71.0 in | Wt 350.0 lb

## 2013-07-13 DIAGNOSIS — L03116 Cellulitis of left lower limb: Secondary | ICD-10-CM

## 2013-07-13 DIAGNOSIS — C8583 Other specified types of non-Hodgkin lymphoma, intra-abdominal lymph nodes: Secondary | ICD-10-CM | POA: Diagnosis not present

## 2013-07-13 DIAGNOSIS — R599 Enlarged lymph nodes, unspecified: Secondary | ICD-10-CM

## 2013-07-13 DIAGNOSIS — L02419 Cutaneous abscess of limb, unspecified: Secondary | ICD-10-CM | POA: Diagnosis not present

## 2013-07-13 DIAGNOSIS — Z86718 Personal history of other venous thrombosis and embolism: Secondary | ICD-10-CM

## 2013-07-13 DIAGNOSIS — J449 Chronic obstructive pulmonary disease, unspecified: Secondary | ICD-10-CM

## 2013-07-13 DIAGNOSIS — Z87898 Personal history of other specified conditions: Secondary | ICD-10-CM

## 2013-07-13 DIAGNOSIS — L03115 Cellulitis of right lower limb: Secondary | ICD-10-CM

## 2013-07-13 DIAGNOSIS — L03119 Cellulitis of unspecified part of limb: Secondary | ICD-10-CM | POA: Diagnosis not present
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
## 2013-07-13 NOTE — Telephone Encounter (Signed)
, °

## 2013-07-14 ENCOUNTER — Ambulatory Visit (INDEPENDENT_AMBULATORY_CARE_PROVIDER_SITE_OTHER): Payer: Medicare Other | Admitting: *Deleted

## 2013-07-14 DIAGNOSIS — Z5181 Encounter for therapeutic drug level monitoring: Secondary | ICD-10-CM

## 2013-07-14 DIAGNOSIS — Z792 Long term (current) use of antibiotics: Secondary | ICD-10-CM | POA: Diagnosis not present

## 2013-07-14 DIAGNOSIS — Z86718 Personal history of other venous thrombosis and embolism: Secondary | ICD-10-CM | POA: Diagnosis not present

## 2013-07-14 LAB — POCT INR: INR: 3.3

## 2013-07-15 LAB — BETA 2 MICROGLOBULIN, SERUM: Beta-2 Microglobulin: 2.49 mg/L (ref <=2.51)

## 2013-07-18 ENCOUNTER — Ambulatory Visit: Payer: Medicare Other | Attending: Oncology | Admitting: Physical Therapy

## 2013-07-18 DIAGNOSIS — I890 Lymphedema, not elsewhere classified: Secondary | ICD-10-CM | POA: Diagnosis not present

## 2013-07-18 DIAGNOSIS — IMO0001 Reserved for inherently not codable concepts without codable children: Secondary | ICD-10-CM | POA: Diagnosis not present

## 2013-07-19 ENCOUNTER — Ambulatory Visit (INDEPENDENT_AMBULATORY_CARE_PROVIDER_SITE_OTHER): Payer: Medicare Other | Admitting: General Surgery

## 2013-07-20 ENCOUNTER — Ambulatory Visit: Payer: Medicare Other | Admitting: Physical Therapy

## 2013-07-27 ENCOUNTER — Ambulatory Visit: Payer: Medicare Other

## 2013-07-27 ENCOUNTER — Ambulatory Visit (INDEPENDENT_AMBULATORY_CARE_PROVIDER_SITE_OTHER): Payer: Medicare Other | Admitting: Emergency Medicine

## 2013-07-27 ENCOUNTER — Other Ambulatory Visit: Payer: Self-pay | Admitting: Oncology

## 2013-07-27 VITALS — BP 120/68 | HR 73 | Temp 97.7°F | Resp 18 | Ht 70.5 in | Wt 343.6 lb

## 2013-07-27 DIAGNOSIS — L97509 Non-pressure chronic ulcer of other part of unspecified foot with unspecified severity: Secondary | ICD-10-CM

## 2013-07-27 DIAGNOSIS — R599 Enlarged lymph nodes, unspecified: Secondary | ICD-10-CM

## 2013-07-27 NOTE — Progress Notes (Signed)
Subjective:    Patient ID: Marc Mills, male    DOB: 08-07-1945, 68 y.o.   MRN: [id number]  HPI This chart was scribed for Remo Lipps Rosalia Mcavoy-MD, by Lovena Le Day, Scribe. This patient was seen in room 5 and the patient's care was started at 9:38 AM.  HPI Comments: Marc Mills is a 68 y.o. male who presents to the [facility] for an ulcerated lesion on the plantar aspect of his right foot; just at the base of his fifth toe. This problem has gradually worsened over the past few weeks. He has been applying topical ointment 3 times per day to his wound and keeping it clean as well. He states this lesion began as a small crack and gradually worsened over time. He is not diabetic.  He also had cellulitis of his right lower leg. This problem has been resolved through previous treatment. His only complaint today is of the ulcer to the plantar aspect of his right foot.   Past Medical History  Diagnosis Date  . Morbid obesity   . History of pulmonary embolus (PE)   . History of lymphoma   . Incisional hernia   . Arthritis   . Blood transfusion without reported diagnosis   . DVT (deep venous thrombosis)     2011 right leg  . Anorectal fistula   . OSA on CPAP     cpap  10 yrs  . Varicose veins of lower extremities     pe  . Cancer     lymphoma hx  . Cataract   . Cellulitis 04/04/2013    Allergies  Allergen Reactions  . Hydrocodone Itching and Other (See Comments)    Gives me chills  . Oxycodone Itching and Other (See Comments)    Gives me chills    No orders of the defined types were placed in this encounter.    Triage Vitals: BP 120/68  Pulse 73  Temp(Src) 97.7 F (36.5 C) (Oral)  Resp 18  Ht 5' 10.5" (1.791 m)  Wt 343 lb 9.6 oz (155.856 kg)  BMI 48.59 kg/m2  SpO2 96%  Review of Systems  Constitutional: Negative for fever and chills.  Respiratory: Negative for cough and shortness of breath.   Cardiovascular: Negative for chest pain.    Gastrointestinal: Negative for abdominal pain.  Musculoskeletal: Negative for back pain.  Skin: Positive for wound (ulcer right foot).      Objective:   Physical Exam Physical Exam  Nursing note and vitals reviewed. Constitutional: Patient is oriented to person, place, and time. Patient appears well-developed and well-nourished. No distress.  HENT:  Head: Normocephalic and atraumatic.  Neck: Neck supple. No tracheal deviation present.  Cardiovascular: Normal rate, regular rhythm and normal heart sounds.   No murmur heard. Pulmonary/Chest: Effort normal and breath sounds normal. No respiratory distress. Patient has no wheezes. Patient has no rales.  Musculoskeletal: Normal range of motion.  Neurological: Patient is alert and oriented to person, place, and time.  Skin: Patient has severe varicosities of the right lower leg. There is 2+ swelling. On the plantar surface just behind the third and fourth toe is a 3 x 2 cm callus-like area. In the center of this callus is a 1/2 cm open area. The skin underneath does not appear infected.   Psychiatric: Patient has a normal mood and affect. Patient's behavior is normal  [facility] reading (PRIMARY) by  Dr.Emilija Bohman there is no evidence of osteomyelitis. Patient has significant degenerative changes  and spur formation over the heel. There is a screw present in the tibia.     Assessment & Plan:  DIAGNOSTIC STUDIES: Oxygen Saturation is 96% on room air, adequate by my interpretation.    COORDINATION OF CARE: At 937 AM Discussed treatment plan with patient which includes right foot X-ray, referral for treatment of his ulcer with Dr. Roosvelt Harps. Patient agrees.  It appears that he formed a callus formation over the bony structures at the base of the third and fourth toes. This area does not appear infected at the present time. We will continue cleaning with soap and water or peroxide followed by application of Bactroban. He will see Dr. Denyse Amass tomorrow  for his opinion. We'll also schedule a limited ultrasound of pelvis to reevaluate a lymph node present in the right groin. I will let Dr. Jana Hakim  know about this.

## 2013-07-28 ENCOUNTER — Ambulatory Visit (INDEPENDENT_AMBULATORY_CARE_PROVIDER_SITE_OTHER): Payer: Medicare Other | Admitting: *Deleted

## 2013-07-28 ENCOUNTER — Telehealth: Payer: Self-pay | Admitting: *Deleted

## 2013-07-28 DIAGNOSIS — Z5181 Encounter for therapeutic drug level monitoring: Secondary | ICD-10-CM | POA: Diagnosis not present

## 2013-07-28 DIAGNOSIS — Z792 Long term (current) use of antibiotics: Secondary | ICD-10-CM | POA: Diagnosis not present

## 2013-07-28 DIAGNOSIS — L97509 Non-pressure chronic ulcer of other part of unspecified foot with unspecified severity: Secondary | ICD-10-CM | POA: Diagnosis not present

## 2013-07-28 DIAGNOSIS — Z86718 Personal history of other venous thrombosis and embolism: Secondary | ICD-10-CM | POA: Diagnosis not present

## 2013-07-28 LAB — POCT INR: INR: 2.1

## 2013-07-28 NOTE — Telephone Encounter (Signed)
Per 3/25 POF I have moved appts from 4/14 to 5/6. Called patient's home and gave the wife the new appts. Calendar mailed

## 2013-07-29 ENCOUNTER — Other Ambulatory Visit: Payer: Self-pay | Admitting: Radiology

## 2013-07-29 LAB — WOUND CULTURE
Gram Stain: NONE SEEN
Gram Stain: NONE SEEN

## 2013-07-29 MED ORDER — CEPHALEXIN 500 MG PO CAPS: 500.0000 mg | ORAL_CAPSULE | Freq: Three times a day (TID) | ORAL | Status: DC

## 2013-08-01 ENCOUNTER — Ambulatory Visit
Admission: RE | Admit: 2013-08-01 | Discharge: 2013-08-01 | Disposition: A | Discharge status: Home / Self Care | Payer: Medicare Other | Source: Ambulatory Visit | Attending: Emergency Medicine | Admitting: Emergency Medicine

## 2013-08-01 DIAGNOSIS — C8596 Non-Hodgkin lymphoma, unspecified, intrapelvic lymph nodes: Secondary | ICD-10-CM | POA: Diagnosis not present

## 2013-08-01 DIAGNOSIS — R599 Enlarged lymph nodes, unspecified: Secondary | ICD-10-CM

## 2013-08-02 ENCOUNTER — Ambulatory Visit (INDEPENDENT_AMBULATORY_CARE_PROVIDER_SITE_OTHER): Payer: Medicare Other | Admitting: General Surgery

## 2013-08-03 ENCOUNTER — Encounter: Payer: Self-pay | Admitting: Radiology

## 2013-08-08 DIAGNOSIS — L97509 Non-pressure chronic ulcer of other part of unspecified foot with unspecified severity: Secondary | ICD-10-CM | POA: Diagnosis not present

## 2013-08-09 ENCOUNTER — Other Ambulatory Visit: Payer: Self-pay | Admitting: *Deleted

## 2013-08-09 ENCOUNTER — Other Ambulatory Visit: Payer: Self-pay | Admitting: Oncology

## 2013-08-09 DIAGNOSIS — L02419 Cutaneous abscess of limb, unspecified: Secondary | ICD-10-CM

## 2013-08-09 DIAGNOSIS — L97909 Non-pressure chronic ulcer of unspecified part of unspecified lower leg with unspecified severity: Secondary | ICD-10-CM

## 2013-08-09 DIAGNOSIS — M7989 Other specified soft tissue disorders: Secondary | ICD-10-CM

## 2013-08-09 DIAGNOSIS — L03119 Cellulitis of unspecified part of limb: Principal | ICD-10-CM

## 2013-08-09 MED ORDER — CEPHALEXIN 500 MG PO CAPS: 500.0000 mg | ORAL_CAPSULE | Freq: Three times a day (TID) | ORAL | Status: DC

## 2013-08-09 NOTE — Telephone Encounter (Signed)
Refilled per Dr. Everlene Farrier

## 2013-08-16 ENCOUNTER — Other Ambulatory Visit: Payer: Medicare Other

## 2013-08-16 ENCOUNTER — Encounter (INDEPENDENT_AMBULATORY_CARE_PROVIDER_SITE_OTHER): Payer: Self-pay | Admitting: General Surgery

## 2013-08-16 ENCOUNTER — Ambulatory Visit: Payer: Medicare Other | Admitting: Oncology

## 2013-08-16 ENCOUNTER — Ambulatory Visit (INDEPENDENT_AMBULATORY_CARE_PROVIDER_SITE_OTHER): Payer: Medicare Other | Admitting: General Surgery

## 2013-08-16 VITALS — BP 156/80 | HR 74 | Temp 98.1°F | Resp 14 | Ht 72.0 in | Wt 342.8 lb

## 2013-08-16 DIAGNOSIS — R599 Enlarged lymph nodes, unspecified: Secondary | ICD-10-CM | POA: Diagnosis not present

## 2013-08-16 DIAGNOSIS — R590 Localized enlarged lymph nodes: Secondary | ICD-10-CM

## 2013-08-16 NOTE — Progress Notes (Signed)
Subjective:     Patient ID: Marc Mills, male   DOB: Jul 26, 1945, 68 y.o.   MRN: [id number]  HPI The patient comes in with a history of lymphoma and a possible enlarged lymph node in his right groin. This was picked up on a PET scan and subsequently confirmed by an ultrasound demonstrating 3 enlarged lymph nodes in the right groin, 2 of which appear to be reactive and the third of which appear to be worrisome for possible lymphoma.  Review of Systems No night sweats, no fevers, no chills, eating well and doing well otherwise.    Objective:   Physical Exam On physical examination of his right lower extremity he has a well-healed cellulitis. Is occurring ulcerated lesion on the bottom of his right foot which appears to be healing with oral antibiotics currently. For the last several weeks to months the patient has been on antibiotics for cellulitis of his right lower extremity and also now the ulcer on his right foot.  On groin examination I cannot palpate any adenopathy in either inguinal area. He has no cervical adenopathy and no axillary adenopathy.    Assessment:     Based on the patient's history of lymphoma it is worse and to have enlarged lymph nodes, however he has none that are palpable and only enlarged lymph nodes on the side where he has had active infections going on for the past several months. Although the ultrasound characteristics of the one enlarged lymph node in his right groin appeared to support the possibility of recurrent malignancy, to have this one isolated inguinal node positive when the patient had an intra-abdominal lymphoma with a negative PET scan for intra-abdominal recurrence would be a bit unusual.  I would recommend reevaluation either with an ultrasound or PET scan in 3-6 months and not doing any surgical excision at this time. I like to see what happens to the adenopathy once the patient's active infectious in his right low sugar to have cleared.     Plan:      Repeat evaluation either with an OR PET SCAN IN 3-6 MONTHS. WOULD NOT PERFORM SURGERY AT THIS TIME AS IT SEEMS LESS LIKELY TO PRESENT AS AN ISOLATED INGUINAL RECURRENT LYMPHOMA, especially when the patient has had recurrent right lower extremity infections.

## 2013-08-18 ENCOUNTER — Ambulatory Visit (INDEPENDENT_AMBULATORY_CARE_PROVIDER_SITE_OTHER): Payer: Medicare Other | Admitting: *Deleted

## 2013-08-18 DIAGNOSIS — Z86718 Personal history of other venous thrombosis and embolism: Secondary | ICD-10-CM | POA: Diagnosis not present

## 2013-08-18 DIAGNOSIS — Z5181 Encounter for therapeutic drug level monitoring: Secondary | ICD-10-CM | POA: Diagnosis not present

## 2013-08-18 DIAGNOSIS — Z792 Long term (current) use of antibiotics: Secondary | ICD-10-CM | POA: Diagnosis not present

## 2013-08-18 LAB — POCT INR: INR: 2.1

## 2013-08-23 ENCOUNTER — Encounter: Payer: Self-pay | Admitting: Vascular Surgery

## 2013-08-24 ENCOUNTER — Ambulatory Visit (INDEPENDENT_AMBULATORY_CARE_PROVIDER_SITE_OTHER): Payer: Medicare Other | Admitting: Vascular Surgery

## 2013-08-24 ENCOUNTER — Ambulatory Visit (HOSPITAL_COMMUNITY)
Admission: RE | Admit: 2013-08-24 | Discharge: 2013-08-24 | Disposition: A | Discharge status: Home / Self Care | Payer: Medicare Other | Source: Ambulatory Visit | Attending: Vascular Surgery | Admitting: Vascular Surgery

## 2013-08-24 ENCOUNTER — Encounter: Payer: Self-pay | Admitting: Vascular Surgery

## 2013-08-24 VITALS — BP 150/88 | HR 62 | Ht 72.0 in | Wt 341.0 lb

## 2013-08-24 DIAGNOSIS — M7989 Other specified soft tissue disorders: Secondary | ICD-10-CM | POA: Insufficient documentation

## 2013-08-24 DIAGNOSIS — L97909 Non-pressure chronic ulcer of unspecified part of unspecified lower leg with unspecified severity: Secondary | ICD-10-CM | POA: Diagnosis not present

## 2013-08-24 NOTE — Progress Notes (Signed)
[facility] [place]  Patient name: Marc Mills MRN: [id number] DOB: Nov 26, 1945 Sex: male  REASON FOR CONSULT: Wound on right foot. Referred by Dr. Joni Fears.  HPI: Marc Mills is a 68 y.o. male he developed a small crack on the plantar aspect of his right foot approximately 3 weeks ago. He has been followed by Dr. Durward Fortes and treated with local wound care and po antibiotics.  After 3 weeks the wound has now healed.  Of note, the patient has a history of significant venous insufficiency. He had a DVT in one leg in 2009 and in the other leg in 2001.Marland Kitchen He also had a pulmonary embolus at that time. He has been on Coumadin since that time. His medical history is also significant for lymphoma and he has undergone previous splenectomy. He underwent previous duplex scans of the right lower extremity. On 04/29/2012 he had a duplex for swelling in the right leg which showed no evidence of DVT of the right lower extremity. Subsequently on 07/06/2013, he had a venous duplex scan of the right lower extremity for swelling which showed no evidence of DVT.  He remains fairly active and swims a mile 6 days a week.  I have reviewed his records from [place]. He's had a previous left total knee replacement and has done very well from that standpoint.  Past Medical History  Diagnosis Date  . Morbid obesity   . History of pulmonary embolus (PE)   . History of lymphoma   . Incisional hernia   . Arthritis   . Blood transfusion without reported diagnosis   . DVT (deep venous thrombosis)     2011 right leg  . Anorectal fistula   . OSA on CPAP     cpap  10 yrs  . Varicose veins of lower extremities     pe  . Cancer     lymphoma hx  . Cataract   . Cellulitis 04/04/2013   Family History  Problem Relation Age of Onset  . Breast cancer    . Heart disease Father   . Varicose Veins Father   . Heart attack Father   . Heart disease Mother 80  .  Varicose Veins Mother    SOCIAL HISTORY: History  Substance Use Topics  . Smoking status: Former Smoker -- 2.00 packs/day for 20 years    Types: Cigarettes    Quit date: 05/05/1984  . Smokeless tobacco: Never Used  . Alcohol Use: No   Allergies  Allergen Reactions  . Hydrocodone Itching and Other (See Comments)    Gives me chills  . Oxycodone Itching and Other (See Comments)    Gives me chills   Current Outpatient Prescriptions  Medication Sig Dispense Refill  . acetaminophen (TYLENOL) 500 MG tablet Take 1,000 mg by mouth every 6 (six) hours as needed for mild pain.      Marland Kitchen b complex vitamins tablet Take 1 tablet by mouth at bedtime.       . calcium carbonate (OS-CAL) 600 MG TABS Take 600 mg by mouth at bedtime.       . cholecalciferol (VITAMIN D) 1000 UNITS tablet Take 1,000 Units by mouth at bedtime.       . citalopram (CELEXA) 20 MG tablet Take 20 mg by mouth daily.      . Multiple Vitamin (MULTIVITAMIN) tablet Take 1 tablet by mouth at bedtime.       . sildenafil (VIAGRA) 100 MG tablet Take 1  tablet (100 mg total) by mouth daily as needed for erectile dysfunction. Splits the tablet in half prn  10 tablet  11  . warfarin (COUMADIN) 5 MG tablet Take 2.5-5 mg by mouth daily. Takes 2.5mg  on Thursday and Sunday. Takes 5mg  all other days       No current facility-administered medications for this visit.   REVIEW OF SYSTEMS: Valu.Nieves ] denotes positive finding; [  ] denotes negative finding  CARDIOVASCULAR:  [ ]  chest pain   [ ]  chest pressure   [ ]  palpitations   [ ]  orthopnea   [ ]  dyspnea on exertion   [ ]  claudication   [ ]  rest pain   Valu.Nieves ] DVT   [ ]  phlebitis PULMONARY:   [ ]  productive cough   [ ]  asthma   [ ]  wheezing NEUROLOGIC:   [ ]  weakness  [ ]  paresthesias  [ ]  aphasia  [ ]  amaurosis  [ ]  dizziness HEMATOLOGIC:   [ ]  bleeding problems   [ ]  clotting disorders MUSCULOSKELETAL:  [ ]  joint pain   [ ]  joint swelling Valu.Nieves ] leg swelling GASTROINTESTINAL: [ ]   blood in stool  [ ]    hematemesis GENITOURINARY:  [ ]   dysuria  [ ]   hematuria PSYCHIATRIC:  [ ]  history of major depression INTEGUMENTARY:  [ ]  rashes  Valu.Nieves ] ulcers CONSTITUTIONAL:  [ ]  fever   [ ]  chills  PHYSICAL EXAM: Filed Vitals:   08/24/13 1534  BP: 150/88  Pulse: 62  Height: 6' (1.829 m)  Weight: 341 lb (154.677 kg)  SpO2: 98%   Body mass index is 46.24 kg/(m^2). GENERAL: The patient is a well-nourished male, in no acute distress. The vital signs are documented above. CARDIOVASCULAR: There is a regular rate and rhythm. Do not detect DVT. He has palpable femoral pulses and palpable dorsalis pedis pulses bilaterally. He has bilateral lower extremity swelling with varicose veins bilaterally. PULMONARY: There is good air exchange bilaterally without wheezing or rales. ABDOMEN: Soft and non-tender with normal pitched bowel sounds.  MUSCULOSKELETAL: There are no major deformities or cyanosis. NEUROLOGIC: No focal weakness or paresthesias are detected. SKIN: There are no ulcers or rashes noted. PSYCHIATRIC: The patient has a normal affect.  DATA:  I have independently interpreted his venous duplex scan today in our office. On the right side, he has no evidence of DVT with exception of what appears to be some old clot in the gastroc veins. He does have some deep vein reflux on the right and also incompetence of the right greater saphenous vein. On the left side he has no evidence of DVT. He does have some reflux in the saphenofemoral junction on the left but no significant reflux in the greater saphenous vein.  He has a biphasic dorsalis pedis and posterior tibial signals bilaterally.  MEDICAL ISSUES: RIGHT FOOT WOUND: Dr. Durward Fortes has successfully treated his right foot wound which is now healed. He has palpable pulses in his feet with biphasic Doppler signals and no evidence of significant arterial insufficiency. He does have significant chronic venous insufficiency and we have discussed the importance of  intermittent leg elevation. He also wears compression stockings. Also feel that water aerobics and a swimming are helpful for patient with venous disease given the graduated compression that they experience. I reassured him that he has excellent arterial flow with respect to his venous disease I would only consider laser ablation of the right greater saphenous vein if his symptoms progressed significantly  and he is in agreement that he would like to avoid any invasive procedures at all possible. I'll be happy to see him back at any time if any new vascular issues arise.   Angelia Mould [facility] [place] Beeper: [phone]

## 2013-09-07 ENCOUNTER — Telehealth: Payer: Self-pay | Admitting: Oncology

## 2013-09-07 ENCOUNTER — Ambulatory Visit (HOSPITAL_BASED_OUTPATIENT_CLINIC_OR_DEPARTMENT_OTHER): Payer: Medicare Other | Admitting: Oncology

## 2013-09-07 ENCOUNTER — Other Ambulatory Visit (HOSPITAL_BASED_OUTPATIENT_CLINIC_OR_DEPARTMENT_OTHER): Payer: Medicare Other

## 2013-09-07 VITALS — BP 138/82 | HR 67 | Temp 98.0°F | Resp 20 | Ht 72.0 in | Wt 338.5 lb

## 2013-09-07 DIAGNOSIS — L03116 Cellulitis of left lower limb: Secondary | ICD-10-CM

## 2013-09-07 DIAGNOSIS — C8587 Other specified types of non-Hodgkin lymphoma, spleen: Secondary | ICD-10-CM | POA: Diagnosis not present

## 2013-09-07 DIAGNOSIS — Z7901 Long term (current) use of anticoagulants: Secondary | ICD-10-CM | POA: Diagnosis not present

## 2013-09-07 DIAGNOSIS — Z87898 Personal history of other specified conditions: Secondary | ICD-10-CM

## 2013-09-07 DIAGNOSIS — Z86711 Personal history of pulmonary embolism: Secondary | ICD-10-CM | POA: Diagnosis not present

## 2013-09-07 DIAGNOSIS — L03115 Cellulitis of right lower limb: Secondary | ICD-10-CM

## 2013-09-07 LAB — CBC WITH DIFFERENTIAL/PLATELET
BASO%: 0.5 % (ref 0.0–2.0)
Basophils Absolute: 0.1 10*3/uL (ref 0.0–0.1)
EOS%: 5.1 % (ref 0.0–7.0)
Eosinophils Absolute: 0.5 10*3/uL (ref 0.0–0.5)
HCT: 40.6 % (ref 38.4–49.9)
HGB: 13.4 g/dL (ref 13.0–17.1)
LYMPH%: 14.5 % (ref 14.0–49.0)
MCH: 29.3 pg (ref 27.2–33.4)
MCHC: 33 g/dL (ref 32.0–36.0)
MCV: 88.6 fL (ref 79.3–98.0)
MONO#: 1.4 10*3/uL — ABNORMAL HIGH (ref 0.1–0.9)
MONO%: 14.5 % — ABNORMAL HIGH (ref 0.0–14.0)
NEUT#: 6.1 10*3/uL (ref 1.5–6.5)
NEUT%: 65.4 % (ref 39.0–75.0)
Platelets: 247 10*3/uL (ref 140–400)
RBC: 4.58 10*6/uL (ref 4.20–5.82)
RDW: 15 % — ABNORMAL HIGH (ref 11.0–14.6)
WBC: 9.3 10*3/uL (ref 4.0–10.3)
lymph#: 1.4 10*3/uL (ref 0.9–3.3)

## 2013-09-07 LAB — COMPREHENSIVE METABOLIC PANEL (CC13)
ALT: 15 U/L (ref 0–55)
AST: 16 U/L (ref 5–34)
Albumin: 3.7 g/dL (ref 3.5–5.0)
Alkaline Phosphatase: 61 U/L (ref 40–150)
Anion Gap: 10 mEq/L (ref 3–11)
BUN: 14.3 mg/dL (ref 7.0–26.0)
CO2: 25 mEq/L (ref 22–29)
Calcium: 9.5 mg/dL (ref 8.4–10.4)
Chloride: 107 mEq/L (ref 98–109)
Creatinine: 0.7 mg/dL (ref 0.7–1.3)
Glucose: 106 mg/dl (ref 70–140)
Potassium: 4.2 mEq/L (ref 3.5–5.1)
Sodium: 141 mEq/L (ref 136–145)
Total Bilirubin: 0.3 mg/dL (ref 0.20–1.20)
Total Protein: 6.9 g/dL (ref 6.4–8.3)

## 2013-09-07 LAB — LACTATE DEHYDROGENASE (CC13): LDH: 124 U/L — ABNORMAL LOW (ref 125–245)

## 2013-09-07 NOTE — Telephone Encounter (Signed)
per pof to sch US/cld [facility] unable to get answer will call pt in am w/appt time & sch
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
## 2013-09-08 ENCOUNTER — Telehealth: Payer: Self-pay | Admitting: Oncology

## 2013-09-08 ENCOUNTER — Other Ambulatory Visit: Payer: Self-pay | Admitting: Oncology

## 2013-09-08 NOTE — Addendum Note (Signed)
Addended by: Laureen Abrahams on: 09/08/2013 05:57 PM   Modules accepted: Medications

## 2013-09-08 NOTE — Telephone Encounter (Signed)
per pof to sch [place] /[facility] sch-cld & tlkwd w/pt wife to adv time & date of appt

## 2013-09-09 LAB — BETA 2 MICROGLOBULIN, SERUM: Beta-2 Microglobulin: 1.68 mg/L (ref <=2.51)

## 2013-09-15 ENCOUNTER — Ambulatory Visit (INDEPENDENT_AMBULATORY_CARE_PROVIDER_SITE_OTHER): Payer: Medicare Other | Admitting: Pharmacist

## 2013-09-15 DIAGNOSIS — Z792 Long term (current) use of antibiotics: Secondary | ICD-10-CM

## 2013-09-15 DIAGNOSIS — Z5181 Encounter for therapeutic drug level monitoring: Secondary | ICD-10-CM

## 2013-09-15 DIAGNOSIS — Z86718 Personal history of other venous thrombosis and embolism: Secondary | ICD-10-CM

## 2013-09-15 LAB — POCT INR: INR: 1.9

## 2013-09-29 ENCOUNTER — Ambulatory Visit (INDEPENDENT_AMBULATORY_CARE_PROVIDER_SITE_OTHER): Payer: Medicare Other | Admitting: *Deleted

## 2013-09-29 DIAGNOSIS — Z792 Long term (current) use of antibiotics: Secondary | ICD-10-CM | POA: Diagnosis not present

## 2013-09-29 DIAGNOSIS — Z5181 Encounter for therapeutic drug level monitoring: Secondary | ICD-10-CM | POA: Diagnosis not present

## 2013-09-29 DIAGNOSIS — Z86718 Personal history of other venous thrombosis and embolism: Secondary | ICD-10-CM | POA: Diagnosis not present

## 2013-09-29 LAB — POCT INR: INR: 3.3

## 2013-10-18 ENCOUNTER — Ambulatory Visit: Payer: Medicare Other | Admitting: Internal Medicine

## 2013-10-19 ENCOUNTER — Encounter: Payer: Self-pay | Admitting: Internal Medicine

## 2013-10-19 ENCOUNTER — Ambulatory Visit (INDEPENDENT_AMBULATORY_CARE_PROVIDER_SITE_OTHER)
Admission: RE | Admit: 2013-10-19 | Discharge: 2013-10-19 | Disposition: A | Discharge status: Home / Self Care | Payer: Medicare Other | Source: Ambulatory Visit | Attending: Internal Medicine | Admitting: Internal Medicine

## 2013-10-19 ENCOUNTER — Ambulatory Visit (INDEPENDENT_AMBULATORY_CARE_PROVIDER_SITE_OTHER): Payer: Medicare Other | Admitting: Internal Medicine

## 2013-10-19 VITALS — BP 114/60 | HR 53 | Ht 72.0 in | Wt 340.4 lb

## 2013-10-19 DIAGNOSIS — Z87891 Personal history of nicotine dependence: Secondary | ICD-10-CM

## 2013-10-19 DIAGNOSIS — Z87898 Personal history of other specified conditions: Secondary | ICD-10-CM

## 2013-10-19 DIAGNOSIS — I8390 Asymptomatic varicose veins of unspecified lower extremity: Secondary | ICD-10-CM | POA: Diagnosis not present

## 2013-10-19 DIAGNOSIS — J449 Chronic obstructive pulmonary disease, unspecified: Secondary | ICD-10-CM

## 2013-10-19 DIAGNOSIS — Z0000 Encounter for general adult medical examination without abnormal findings: Secondary | ICD-10-CM | POA: Diagnosis not present

## 2013-10-19 DIAGNOSIS — G4733 Obstructive sleep apnea (adult) (pediatric): Secondary | ICD-10-CM

## 2013-10-19 DIAGNOSIS — Z8572 Personal history of non-Hodgkin lymphomas: Secondary | ICD-10-CM

## 2013-10-19 DIAGNOSIS — Z86718 Personal history of other venous thrombosis and embolism: Secondary | ICD-10-CM

## 2013-10-19 NOTE — Assessment & Plan Note (Signed)
Support hose!

## 2013-10-19 NOTE — Patient Instructions (Addendum)
Order- schedule NPSG split protocol      Dx OSA  Order CXR  Dx former smoker, hx lymphoma  Please call as needed
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
## 2013-10-19 NOTE — Assessment & Plan Note (Signed)
Plan- CXR 

## 2013-10-19 NOTE — Assessment & Plan Note (Signed)
Chronic anticoagulation 

## 2013-10-19 NOTE — Assessment & Plan Note (Signed)
Good compliance and control. Machine worn out.Will need new documentation- last study [place]- NPSG to document for replacement CPAP machine

## 2013-10-20 ENCOUNTER — Ambulatory Visit (INDEPENDENT_AMBULATORY_CARE_PROVIDER_SITE_OTHER): Payer: Medicare Other | Admitting: *Deleted

## 2013-10-20 DIAGNOSIS — Z5181 Encounter for therapeutic drug level monitoring: Secondary | ICD-10-CM

## 2013-10-20 DIAGNOSIS — Z86718 Personal history of other venous thrombosis and embolism: Secondary | ICD-10-CM

## 2013-10-20 DIAGNOSIS — Z792 Long term (current) use of antibiotics: Secondary | ICD-10-CM

## 2013-10-20 LAB — POCT INR: INR: 1.9

## 2013-10-31 ENCOUNTER — Ambulatory Visit (HOSPITAL_COMMUNITY)
Admission: RE | Admit: 2013-10-31 | Discharge: 2013-10-31 | Disposition: A | Discharge status: Home / Self Care | Payer: Medicare Other | Source: Ambulatory Visit | Attending: Oncology | Admitting: Oncology

## 2013-10-31 DIAGNOSIS — L03119 Cellulitis of unspecified part of limb: Secondary | ICD-10-CM | POA: Diagnosis not present

## 2013-10-31 DIAGNOSIS — L03116 Cellulitis of left lower limb: Secondary | ICD-10-CM

## 2013-10-31 DIAGNOSIS — L02419 Cutaneous abscess of limb, unspecified: Secondary | ICD-10-CM | POA: Insufficient documentation

## 2013-10-31 DIAGNOSIS — R599 Enlarged lymph nodes, unspecified: Secondary | ICD-10-CM | POA: Diagnosis not present

## 2013-10-31 DIAGNOSIS — L03115 Cellulitis of right lower limb: Secondary | ICD-10-CM

## 2013-10-31 DIAGNOSIS — Z87898 Personal history of other specified conditions: Secondary | ICD-10-CM

## 2013-11-03 ENCOUNTER — Telehealth: Payer: Self-pay | Admitting: *Deleted

## 2013-11-03 NOTE — Telephone Encounter (Signed)
Patient left a voice message wanting Ultrasound results. This RN spoke with patient informing him that Dr. Jana Hakim was out of the office and would return on Monday. The office will call him with results. Patient verbalized understanding.

## 2013-11-06 ENCOUNTER — Other Ambulatory Visit: Payer: Self-pay | Admitting: Oncology

## 2013-11-08 ENCOUNTER — Telehealth (INDEPENDENT_AMBULATORY_CARE_PROVIDER_SITE_OTHER): Payer: Self-pay

## 2013-11-08 NOTE — Telephone Encounter (Signed)
Called pt with appt for Thursday

## 2013-11-08 NOTE — Telephone Encounter (Signed)
Message copied by Carlene Coria on Tue Nov 08, 2013  2:07 PM ------      Message from: Doreen Salvage      Created: Sun Nov 06, 2013  4:48 PM       I agree and will get him set to come in to see me soon.  I have forwarded this message to my assistant and we will get this set up.      ----- Message -----         From: Chauncey Cruel, MD         Sent: 11/06/2013  12:19 PM           To: Gwenyth Ober, MD            Hi, Jay_ [name]'s new US shows the 2.4 cm node now >5 cm; there are a couple of other nodes also suspicious-- I would vote for Bx if you feel you can do it safely--             Let me know what you think            gusM       ------

## 2013-11-10 ENCOUNTER — Ambulatory Visit (INDEPENDENT_AMBULATORY_CARE_PROVIDER_SITE_OTHER): Payer: Medicare Other | Admitting: General Surgery

## 2013-11-10 VITALS — BP 142/88 | HR 74 | Resp 14 | Ht 72.0 in | Wt 334.0 lb

## 2013-11-10 DIAGNOSIS — R599 Enlarged lymph nodes, unspecified: Secondary | ICD-10-CM

## 2013-11-10 DIAGNOSIS — R590 Localized enlarged lymph nodes: Secondary | ICD-10-CM

## 2013-11-10 NOTE — Progress Notes (Signed)
Subjective:     Patient ID: Marc Mills, male   DOB: March 09, 1946, 68 y.o.   MRN: [id number]  HPI The patient is here for evaluation of right inguinal adenopathy noted on recent ultrasound of the groin. He is at risk for recurrent lymphoma. And because of this he requires a right inguinal lymph node biopsy and expiration.  Review of Systems The patient has had a right foot infection and cellulitis previously. This could be the cause of the enlarged lymph nodes in his right groin.    Objective:   Physical Exam The right foot wound has healed completely. There is no evidence of infection or cellulitis.  In his right inguinal area and I cannot palpate a very discernible inguinal lymph node. This will likely be a blind exploration of his right ankle area for lymph nodes.    Assessment:     Right inguinal adenopathy in patient risk for recurrent lymphoma.     Plan:     Operative right inguinal exploration and groin expiration or lymph node biopsy. This will be done as an outpatient as soon as possible at [facility].

## 2013-11-12 NOTE — Pre-Procedure Instructions (Addendum)
Marc Mills  11/12/2013   Your procedure is scheduled on:  July 14  Report to [facility] Admitting at 09:20 AM.  Call this number if you have problems the morning of surgery: [phone]   Remember:   Do not eat food or drink liquids after midnight.   Take these medicines the morning of surgery with A SIP OF WATER: Celexa, Tylenol (if needed)     Stop Coumadin ,B Complex, Os- Cal, Vitamin D, Cinnamon, Glucosamine- Chondrotin, Multiple Vitamins, Vitamin E as of today   Do not wear jewelry, make-up or nail polish.  Do not wear lotions, powders, or perfumes. You may wear deodorant.  Do not shave 48 hours prior to surgery. Men may shave face and neck.  Do not bring valuables to the hospital.  [facility] is not responsible for any belongings or valuables.               Contacts, dentures or bridgework may not be worn into surgery.  Leave suitcase in the car. After surgery it may be brought to your room.  For patients admitted to the hospital, discharge time is determined by your treatment team.               Patients discharged the day of surgery will not be allowed to drive home.  Name and phone number of your driver: Family/ Friend  Special Instructions: See [facility]   Please read over the following fact sheets that you were given: Pain Booklet, Coughing and Deep Breathing and Surgical Site Infection Prevention

## 2013-11-14 ENCOUNTER — Encounter (HOSPITAL_COMMUNITY)
Admission: RE | Admit: 2013-11-14 | Discharge: 2013-11-14 | Disposition: A | Discharge status: Home / Self Care | Payer: Medicare Other | Source: Ambulatory Visit | Attending: General Surgery | Admitting: General Surgery

## 2013-11-14 ENCOUNTER — Encounter (HOSPITAL_COMMUNITY): Payer: Self-pay

## 2013-11-14 DIAGNOSIS — Z87891 Personal history of nicotine dependence: Secondary | ICD-10-CM | POA: Diagnosis not present

## 2013-11-14 DIAGNOSIS — G4730 Sleep apnea, unspecified: Secondary | ICD-10-CM | POA: Diagnosis not present

## 2013-11-14 DIAGNOSIS — Z86718 Personal history of other venous thrombosis and embolism: Secondary | ICD-10-CM | POA: Diagnosis not present

## 2013-11-14 DIAGNOSIS — R599 Enlarged lymph nodes, unspecified: Secondary | ICD-10-CM | POA: Diagnosis not present

## 2013-11-14 LAB — BASIC METABOLIC PANEL
Anion gap: 12 (ref 5–15)
BUN: 13 mg/dL (ref 6–23)
CO2: 26 mEq/L (ref 19–32)
Calcium: 9.5 mg/dL (ref 8.4–10.5)
Chloride: 102 mEq/L (ref 96–112)
Creatinine, Ser: 0.67 mg/dL (ref 0.50–1.35)
GFR calc Af Amer: >90 mL/min (ref >90)
GFR calc non Af Amer: >90 mL/min (ref >90)
Glucose, Bld: 90 mg/dL (ref 70–99)
Potassium: 4.7 mEq/L (ref 3.7–5.3)
Sodium: 140 mEq/L (ref 137–147)

## 2013-11-14 LAB — CBC WITH DIFFERENTIAL/PLATELET
Basophils Absolute: 0 10*3/uL (ref 0.0–0.1)
Basophils Relative: 1 % (ref 0–1)
Eosinophils Absolute: 0.3 10*3/uL (ref 0.0–0.7)
Eosinophils Relative: 5 % (ref 0–5)
HCT: 41.6 % (ref 39.0–52.0)
Hemoglobin: 13.9 g/dL (ref 13.0–17.0)
Lymphocytes Relative: 14 % (ref 12–46)
Lymphs Abs: 1 10*3/uL (ref 0.7–4.0)
MCH: 30 pg (ref 26.0–34.0)
MCHC: 33.4 g/dL (ref 30.0–36.0)
MCV: 89.7 fL (ref 78.0–100.0)
Monocytes Absolute: 1.1 10*3/uL — ABNORMAL HIGH (ref 0.1–1.0)
Monocytes Relative: 16 % — ABNORMAL HIGH (ref 3–12)
Neutro Abs: 4.2 10*3/uL (ref 1.7–7.7)
Neutrophils Relative %: 64 % (ref 43–77)
Platelets: 315 10*3/uL (ref 150–400)
RBC: 4.64 MIL/uL (ref 4.22–5.81)
RDW: 15.2 % (ref 11.5–15.5)
WBC: 6.7 10*3/uL (ref 4.0–10.5)

## 2013-11-14 LAB — PROTIME-INR
INR: 1.14 (ref 0.00–1.49)
Prothrombin Time: 14.6 seconds (ref 11.6–15.2)

## 2013-11-14 MED ORDER — DEXTROSE 5 % IV SOLN
3.0000 g | INTRAVENOUS | Status: AC
  Administered 2013-11-15: 3 g via INTRAVENOUS
  Filled 2013-11-14: qty 3000
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
## 2013-11-15 ENCOUNTER — Encounter (HOSPITAL_COMMUNITY): Payer: Self-pay | Admitting: Surgery

## 2013-11-15 ENCOUNTER — Ambulatory Visit (HOSPITAL_COMMUNITY): Payer: Medicare Other | Admitting: Anesthesiology

## 2013-11-15 ENCOUNTER — Encounter (HOSPITAL_COMMUNITY): Payer: Medicare Other | Admitting: Vascular Surgery

## 2013-11-15 ENCOUNTER — Ambulatory Visit (HOSPITAL_COMMUNITY)
Admission: RE | Admit: 2013-11-15 | Discharge: 2013-11-15 | Disposition: A | Discharge status: Home / Self Care | Payer: Medicare Other | Source: Ambulatory Visit | Attending: General Surgery | Admitting: General Surgery

## 2013-11-15 ENCOUNTER — Encounter (HOSPITAL_COMMUNITY)
Admission: RE | Disposition: A | Discharge status: Home / Self Care | Payer: Self-pay | Source: Ambulatory Visit | Attending: General Surgery

## 2013-11-15 DIAGNOSIS — Z87891 Personal history of nicotine dependence: Secondary | ICD-10-CM | POA: Insufficient documentation

## 2013-11-15 DIAGNOSIS — M1990 Unspecified osteoarthritis, unspecified site: Secondary | ICD-10-CM | POA: Diagnosis not present

## 2013-11-15 DIAGNOSIS — R599 Enlarged lymph nodes, unspecified: Secondary | ICD-10-CM | POA: Diagnosis not present

## 2013-11-15 DIAGNOSIS — G4730 Sleep apnea, unspecified: Secondary | ICD-10-CM | POA: Insufficient documentation

## 2013-11-15 DIAGNOSIS — Z86718 Personal history of other venous thrombosis and embolism: Secondary | ICD-10-CM | POA: Insufficient documentation

## 2013-11-15 DIAGNOSIS — R590 Localized enlarged lymph nodes: Secondary | ICD-10-CM

## 2013-11-15 HISTORY — PX: INGUINAL LYMPH NODE BIOPSY: SHX5865

## 2013-11-15 SURGERY — BIOPSY, LYMPH NODE, INGUINAL, OPEN
Anesthesia: General | Site: Groin

## 2013-11-15 MED ORDER — FENTANYL CITRATE 0.05 MG/ML IJ SOLN
INTRAMUSCULAR | Status: DC | PRN
  Administered 2013-11-15 (×2): 50 ug via INTRAVENOUS
  Administered 2013-11-15: 100 ug via INTRAVENOUS
  Administered 2013-11-15: 50 ug via INTRAVENOUS

## 2013-11-15 MED ORDER — LIDOCAINE HCL 1 % IJ SOLN
INTRAMUSCULAR | Status: DC | PRN
  Administered 2013-11-15: 12:00:00

## 2013-11-15 MED ORDER — CHLORHEXIDINE GLUCONATE 4 % EX LIQD
1.0000 "application " | Freq: Once | CUTANEOUS | Status: DC
  Filled 2013-11-15: qty 15

## 2013-11-15 MED ORDER — FENTANYL CITRATE 0.05 MG/ML IJ SOLN
INTRAMUSCULAR | Status: AC
  Filled 2013-11-15: qty 5

## 2013-11-15 MED ORDER — 0.9 % SODIUM CHLORIDE (POUR BTL) OPTIME
TOPICAL | Status: DC | PRN
  Administered 2013-11-15: 1000 mL

## 2013-11-15 MED ORDER — PROPOFOL 10 MG/ML IV BOLUS
INTRAVENOUS | Status: AC
  Filled 2013-11-15: qty 20

## 2013-11-15 MED ORDER — MIDAZOLAM HCL 2 MG/2ML IJ SOLN
INTRAMUSCULAR | Status: AC
  Filled 2013-11-15: qty 2

## 2013-11-15 MED ORDER — LACTATED RINGERS IV SOLN
INTRAVENOUS | Status: DC
  Administered 2013-11-15: 10:00:00 via INTRAVENOUS

## 2013-11-15 MED ORDER — MIDAZOLAM HCL 5 MG/5ML IJ SOLN
INTRAMUSCULAR | Status: DC | PRN
  Administered 2013-11-15: 2 mg via INTRAVENOUS

## 2013-11-15 MED ORDER — HYDROMORPHONE HCL PF 1 MG/ML IJ SOLN
INTRAMUSCULAR | Status: AC
  Filled 2013-11-15: qty 1

## 2013-11-15 MED ORDER — TRAMADOL HCL 50 MG PO TABS: 50.0000 mg | ORAL_TABLET | Freq: Four times a day (QID) | ORAL | Status: DC | PRN

## 2013-11-15 MED ORDER — LIDOCAINE HCL (CARDIAC) 20 MG/ML IV SOLN
INTRAVENOUS | Status: DC | PRN
  Administered 2013-11-15: 40 mg via INTRAVENOUS

## 2013-11-15 MED ORDER — ONDANSETRON HCL 4 MG/2ML IJ SOLN
INTRAMUSCULAR | Status: DC | PRN
  Administered 2013-11-15: 4 mg via INTRAVENOUS

## 2013-11-15 MED ORDER — BUPIVACAINE-EPINEPHRINE (PF) 0.25% -1:200000 IJ SOLN
INTRAMUSCULAR | Status: AC
  Filled 2013-11-15: qty 30

## 2013-11-15 MED ORDER — LACTATED RINGERS IV SOLN
INTRAVENOUS | Status: DC | PRN
  Administered 2013-11-15: 11:00:00 via INTRAVENOUS

## 2013-11-15 MED ORDER — HYDROMORPHONE HCL PF 1 MG/ML IJ SOLN
0.2500 mg | INTRAMUSCULAR | Status: DC | PRN
  Administered 2013-11-15 (×2): 0.5 mg via INTRAVENOUS

## 2013-11-15 MED ORDER — PROPOFOL 10 MG/ML IV BOLUS
INTRAVENOUS | Status: DC | PRN
  Administered 2013-11-15: 30 mg via INTRAVENOUS
  Administered 2013-11-15: 50 mg via INTRAVENOUS
  Administered 2013-11-15: 150 mg via INTRAVENOUS

## 2013-11-15 MED ORDER — ONDANSETRON HCL 4 MG/2ML IJ SOLN
INTRAMUSCULAR | Status: AC
  Filled 2013-11-15: qty 2

## 2013-11-15 SURGICAL SUPPLY — 50 items
ADH SKN CLS APL DERMABOND .7 (GAUZE/BANDAGES/DRESSINGS) ×1
APPLIER CLIP 9.375 MED OPEN (MISCELLANEOUS) ×4
APR CLP MED 9.3 20 MLT OPN (MISCELLANEOUS) ×2
BLADE SURG ROTATE 9660 (MISCELLANEOUS) IMPLANT
CANISTER SUCTION 2500CC (MISCELLANEOUS) IMPLANT
CHLORAPREP W/TINT 26ML (MISCELLANEOUS) ×2 IMPLANT
CLIP APPLIE 9.375 MED OPEN (MISCELLANEOUS) ×1 IMPLANT
CONT SPEC 4OZ CLIKSEAL STRL BL (MISCELLANEOUS) ×2 IMPLANT
COVER SURGICAL LIGHT HANDLE (MISCELLANEOUS) ×2 IMPLANT
DERMABOND ADVANCED (GAUZE/BANDAGES/DRESSINGS) ×1
DERMABOND ADVANCED .7 DNX12 (GAUZE/BANDAGES/DRESSINGS) ×1 IMPLANT
DRAPE PED LAPAROTOMY (DRAPES) ×2 IMPLANT
DRAPE UTILITY 15X26 W/TAPE STR (DRAPE) ×4 IMPLANT
DRESSING TELFA 8X10 (GAUZE/BANDAGES/DRESSINGS) ×1 IMPLANT
DRSG TEGADERM 4X4.75 (GAUZE/BANDAGES/DRESSINGS) ×1 IMPLANT
ELECT CAUTERY BLADE 6.4 (BLADE) ×2 IMPLANT
ELECT REM PT RETURN 9FT ADLT (ELECTROSURGICAL) ×2
ELECTRODE REM PT RTRN 9FT ADLT (ELECTROSURGICAL) ×1 IMPLANT
GAUZE SPONGE 4X4 16PLY XRAY LF (GAUZE/BANDAGES/DRESSINGS) ×2 IMPLANT
GLOVE BIO SURGEON STRL SZ7.5 (GLOVE) ×1 IMPLANT
GLOVE BIOGEL PI IND STRL 7.0 (GLOVE) IMPLANT
GLOVE BIOGEL PI IND STRL 7.5 (GLOVE) IMPLANT
GLOVE BIOGEL PI IND STRL 8 (GLOVE) ×1 IMPLANT
GLOVE BIOGEL PI INDICATOR 7.0 (GLOVE) ×1
GLOVE BIOGEL PI INDICATOR 7.5 (GLOVE) ×1
GLOVE BIOGEL PI INDICATOR 8 (GLOVE) ×1
GLOVE ECLIPSE 7.5 STRL STRAW (GLOVE) ×2 IMPLANT
GLOVE SURG SS PI 7.0 STRL IVOR (GLOVE) ×1 IMPLANT
GOWN STRL REUS W/ TWL LRG LVL3 (GOWN DISPOSABLE) ×1 IMPLANT
GOWN STRL REUS W/TWL LRG LVL3 (GOWN DISPOSABLE) ×2
KIT BASIN OR (CUSTOM PROCEDURE TRAY) ×2 IMPLANT
KIT ROOM TURNOVER OR (KITS) ×2 IMPLANT
NDL HYPO 25GX1X1/2 BEV (NEEDLE) ×1 IMPLANT
NEEDLE HYPO 25GX1X1/2 BEV (NEEDLE) ×2 IMPLANT
NS IRRIG 1000ML POUR BTL (IV SOLUTION) ×2 IMPLANT
PACK SURGICAL SETUP 50X90 (CUSTOM PROCEDURE TRAY) ×2 IMPLANT
PAD ARMBOARD 7.5X6 YLW CONV (MISCELLANEOUS) ×2 IMPLANT
PENCIL BUTTON HOLSTER BLD 10FT (ELECTRODE) ×2 IMPLANT
STAPLER VISISTAT 35W (STAPLE) IMPLANT
STRIP CLOSURE SKIN 1/2X4 (GAUZE/BANDAGES/DRESSINGS) ×1 IMPLANT
SUT MNCRL AB 4-0 PS2 18 (SUTURE) ×2 IMPLANT
SUT VIC AB 3-0 SH 27 (SUTURE) ×2
SUT VIC AB 3-0 SH 27X BRD (SUTURE) ×1 IMPLANT
SUT VICRYL AB 2 0 TIES (SUTURE) ×2 IMPLANT
SYR BULB 3OZ (MISCELLANEOUS) ×2 IMPLANT
SYR CONTROL 10ML LL (SYRINGE) ×2 IMPLANT
TOWEL OR 17X24 6PK STRL BLUE (TOWEL DISPOSABLE) ×2 IMPLANT
TOWEL OR 17X26 10 PK STRL BLUE (TOWEL DISPOSABLE) ×2 IMPLANT
TUBE CONNECTING 12X1/4 (SUCTIONS) IMPLANT
YANKAUER SUCT BULB TIP NO VENT (SUCTIONS) IMPLANT

## 2013-11-15 NOTE — Progress Notes (Signed)
Nurse called Dr. Ola Spurr to see if patient needed to have a APTT drawn, and Dr. Ola Spurr stated it was not needed.

## 2013-11-15 NOTE — Discharge Instructions (Signed)
Swollen Lymph Nodes The lymphatic system filters fluid from around cells. It is like a system of blood vessels. These channels carry lymph instead of blood. The lymphatic system is an important part of the immune (disease fighting) system. When people talk about "swollen glands in the neck," they are usually talking about swollen lymph nodes. The lymph nodes are like the little traps for infection. You and your caregiver may be able to feel lymph nodes, especially swollen nodes, in these common areas: the groin (inguinal area), armpits (axilla), and above the clavicle (supraclavicular). You may also feel them in the neck (cervical) and the back of the head just above the hairline (occipital). Swollen glands occur when there is any condition in which the body responds with an allergic type of reaction. For instance, the glands in the neck can become swollen from insect bites or any type of minor infection on the head. These are very noticeable in children with only minor problems. Lymph nodes may also become swollen when there is a tumor or problem with the lymphatic system, such as Hodgkin's disease. TREATMENT   Most swollen glands do not require treatment. They can be observed (watched) for a short period of time, if your caregiver feels it is necessary. Most of the time, observation is not necessary.  Antibiotics (medicines that kill germs) may be prescribed by your caregiver. Your caregiver may prescribe these if he or she feels the swollen glands are due to a bacterial (germ) infection. Antibiotics are not used if the swollen glands are caused by a virus. HOME CARE INSTRUCTIONS   Take medications as directed by your caregiver. Only take over-the-counter or prescription medicines for pain, discomfort, or fever as directed by your caregiver. SEEK MEDICAL CARE IF:   If you begin to run a temperature greater than 102 F (38.9 C), or as your caregiver suggests. MAKE SURE YOU:   Understand these  instructions.  Will watch your condition.  Will get help right away if you are not doing well or get worse. Document Released: 04/11/2002 Document Revised: 07/14/2011 Document Reviewed: 04/21/2005 [facility] Patient Information 2015 [place], [place]. This information is not intended to replace advice given to you by your health care provider. Make sure you discuss any questions you have with your health care provider. Lymph Node Biopsy A lymph node biopsy is a procedure in which lymph nodes are identified, removed, and examined for cancer. Lymph nodes are collections of tissue that help filter infections, cancer cells, and other waste substances from the bloodstream. Certain types of cancer can spread to or originate in lymph nodes.  Examining the lymph nodes for cancer or lymphoma can help your caregiver plan future treatment for you. LET YOUR CAREGIVER KNOW ABOUT:   Allergies to food or medicine.  Medicines taken, including vitamins, herbs, eyedrops, over-the-counter medicines, and creams.  Use of steroids (by mouth or creams).  Previous problems with numbing medicines.  History of bleeding problems or blood clots.  Previous surgery.  Other health problems, including diabetes and kidney problems.  Possibility of pregnancy, if this applies. RISKS AND COMPLICATIONS   Infection.  Bleeding.  Allergic reaction to the dye used for the procedure.  Blue staining of the skin where the dye is injected.  Damaged lymph vessels, causing a buildup of fluid (lymphedema).  Pain or bruising at the biopsy site. BEFORE THE PROCEDURE   Stop smoking at least 2 weeks before the procedure. Not smoking will improve your health after the procedure and decrease the  chance of getting a wound infection.  You may have blood tests to make sure your blood clots normally.  Ask your caregiver about changing or stopping your regular medicines.  Do not eat or drink anything for 8 hours before the  procedure. PROCEDURE   You will be given medicine that makes you sleep (general anesthetic).  An incision will be made, and the lymph node(s) will be removed.  The sentinel lymph node will be examined in a lab.   AFTER THE PROCEDURE   You will go to a recovery room.  You will be monitored for several hours.  If complications do not occur, you will be allowed to go home a few hours after the procedure.  Leave your dressing intact until you next clinic visit  Document Released: 07/14/2011 Document Reviewed: 07/14/2011 [employer] Patient Information 2015 [place], [place]. This information is not intended to replace advice given to you by your health care provider. Make sure you discuss any questions you have with your health care provider.

## 2013-11-15 NOTE — Anesthesia Postprocedure Evaluation (Signed)
  Anesthesia Post-op Note  Patient: Marc Mills  Procedure(s) Performed: Procedure(s): RIGHT INGUINAL LYMPH NODE BIOPSY ([name])  Patient Location: PACU  Anesthesia Type:General  Level of Consciousness: awake, alert , oriented and patient cooperative  Airway and Oxygen Therapy: Patient Spontanous Breathing  Post-op Pain: mild  Post-op Assessment: Post-op Vital signs reviewed, Patient's Cardiovascular Status Stable, Respiratory Function Stable, Patent Airway, No signs of Nausea or vomiting and Pain level controlled  Post-op Vital Signs: stable  Last Vitals:  Filed Vitals:   11/15/13 1317  BP: 131/69  Pulse: 57  Temp:   Resp: 14    Complications: No apparent anesthesia complications

## 2013-11-15 NOTE — Interval H&P Note (Signed)
History and Physical Interval Note: For right inguinal lymph node biopsy.  May use Methylene blue for localization. 11/15/2013 10:53 AM  Marc Mills  has presented today for surgery, with the diagnosis of right inguinal adenopathy  The various methods of treatment have been discussed with the patient and family. After consideration of risks, benefits and other options for treatment, the patient has consented to  Procedure(s): RIGHT INGUINAL LYMPH NODE BIOPSY ([name]) as a surgical intervention .  The patient's history has been reviewed, patient examined, no change in status, stable for surgery.  I have reviewed the patient's chart and labs.  Questions were answered to the patient's satisfaction.     [name], Kathryne Eriksson

## 2013-11-15 NOTE — Op Note (Signed)
OPERATIVE REPORT  DATE OF OPERATION: 11/15/2013  PATIENT:  Marc Mills  68 y.o. male  PRE-OPERATIVE DIAGNOSIS:  right inguinal adenopathy  POST-OPERATIVE DIAGNOSIS:  right inguinal adenopathy  PROCEDURE:  Procedure(s): RIGHT INGUINAL LYMPH NODE BIOPSY  SURGEON:  Surgeon(s): Gwenyth Ober, MD  ASSISTANT: None  ANESTHESIA:   general and LMA  EBL: <20 ml  BLOOD ADMINISTERED: none  DRAINS: none   SPECIMEN:  Source of Specimen:  Inguinal lymph nodes from the right  COUNTS CORRECT:  YES  PROCEDURE DETAILS: The patient was taken to the operating room and placed on the table in the supine position. After an adequate general laryngeal airway anesthetic was administered he was prepped and draped in the usual sterile manner exposing his right inguinal area.  A proper timeout was performed identifying the patient and the procedure to be performed. The area of the incision which was inferior to the right inguinal fold was selected. A transverse incision approximately 7 cm long was made down to subcutaneous tissue. Electrocautery was used to dissect down into the subcutaneous tissue. Several matted lymph nodes were noted just deep to the subcutaneous tissue and this was dissected out using hemoclips electrocautery and Metzenbaum scissors. Once hemostasis was obtained in the lymph node specimen was removed which measured approximately 3 x 4 cm in size, the wound is closed in 2 layers. The subcutaneous was closed using interrupted 3-0 Vicryl sutures and the skin was closed using a running subcuticular stitch of 3-0 Monocryl. Dermabond Steri-Strips and Tegaderm used to complete the dressing on needle counts, sponge counts, and instrument counts were correct.  PATIENT DISPOSITION:  PACU - hemodynamically stable.   Gwenyth Ober 7/14/201512:40 PM

## 2013-11-15 NOTE — Anesthesia Preprocedure Evaluation (Addendum)
Anesthesia Evaluation  Patient identified by MRN, date of birth, ID band Patient awake    Reviewed: Allergy & Precautions, H&P , NPO status , Patient's Chart, lab work & pertinent test results  Airway Mallampati: II TM Distance: >3 FB Neck ROM: Full    Dental no notable dental hx. (+) Teeth Intact, Dental Advisory Given   Pulmonary sleep apnea and Continuous Positive Airway Pressure Ventilation , former smoker, PE breath sounds clear to auscultation  Pulmonary exam normal       Cardiovascular + Peripheral Vascular Disease and DVT Rhythm:Regular Rate:Normal     Neuro/Psych negative neurological ROS  negative psych ROS   GI/Hepatic negative GI ROS, Neg liver ROS,   Endo/Other  Morbid obesity  Renal/GU negative Renal ROS  negative genitourinary   Musculoskeletal   Abdominal   Peds  Hematology negative hematology ROS (+)   Anesthesia Other Findings   Reproductive/Obstetrics negative OB ROS                          Anesthesia Physical Anesthesia Plan  ASA: III  Anesthesia Plan: General   Post-op Pain Management:    Induction: Intravenous  Airway Management Planned: LMA  Additional Equipment:   Intra-op Plan:   Post-operative Plan: Extubation in OR  Informed Consent: I have reviewed the patients History and Physical, chart, labs and discussed the procedure including the risks, benefits and alternatives for the proposed anesthesia with the patient or authorized representative who has indicated his/her understanding and acceptance.   Dental advisory given  Plan Discussed with: CRNA  Anesthesia Plan Comments:         Anesthesia Quick Evaluation

## 2013-11-15 NOTE — Transfer of Care (Signed)
Immediate Anesthesia Transfer of Care Note  Patient: Marc Mills  Procedure(s) Performed: Procedure(s): RIGHT INGUINAL LYMPH NODE BIOPSY ([name])  Patient Location: PACU  Anesthesia Type:General  Level of Consciousness: awake, oriented, patient cooperative and responds to stimulation  Airway & Oxygen Therapy: Patient Spontanous Breathing  Post-op Assessment: Report given to PACU RN and Post -op Vital signs reviewed and stable  Post vital signs: Reviewed and stable  Complications: No apparent anesthesia complications

## 2013-11-15 NOTE — H&P (View-Only) (Signed)
Subjective:     Patient ID: Marc Mills, male   DOB: 03-17-1946, 68 y.o.   MRN: [id number]  HPI The patient is here for evaluation of right inguinal adenopathy noted on recent ultrasound of the groin. He is at risk for recurrent lymphoma. And because of this he requires a right inguinal lymph node biopsy and expiration.  Review of Systems The patient has had a right foot infection and cellulitis previously. This could be the cause of the enlarged lymph nodes in his right groin.    Objective:   Physical Exam The right foot wound has healed completely. There is no evidence of infection or cellulitis.  In his right inguinal area and I cannot palpate a very discernible inguinal lymph node. This will likely be a blind exploration of his right ankle area for lymph nodes.    Assessment:     Right inguinal adenopathy in patient risk for recurrent lymphoma.     Plan:     Operative right inguinal exploration and groin expiration or lymph node biopsy. This will be done as an outpatient as soon as possible at [place].

## 2013-11-17 ENCOUNTER — Other Ambulatory Visit: Payer: Self-pay | Admitting: Oncology

## 2013-11-17 ENCOUNTER — Telehealth (INDEPENDENT_AMBULATORY_CARE_PROVIDER_SITE_OTHER): Payer: Self-pay

## 2013-11-17 NOTE — Telephone Encounter (Signed)
Calling pt to see how he was doing after sx. Pt states that he has been doing great since sx. Encouraged pt to give [place] a call back if there was anything he needs. Pt verbalized understanding

## 2013-11-17 NOTE — Telephone Encounter (Signed)
Message copied by Carlene Coria on Thu Nov 17, 2013  3:01 PM ------      Message from: Francee Nodal      Created: Thu Nov 17, 2013 10:53 AM      Regarding: po appt       Marc Mills, Marc Mills is calling today to get a 2 week po appt. I looked I didn't see anything with Dr Hulen Skains. The pt is not very happy.       Thanks      Tonya  ------

## 2013-11-17 NOTE — Telephone Encounter (Signed)
Called pt with appt info

## 2013-11-18 ENCOUNTER — Encounter (HOSPITAL_COMMUNITY): Payer: Self-pay | Admitting: General Surgery

## 2013-11-19 ENCOUNTER — Other Ambulatory Visit: Payer: Self-pay | Admitting: Oncology

## 2013-11-24 ENCOUNTER — Other Ambulatory Visit: Payer: Self-pay | Admitting: Oncology

## 2013-11-24 ENCOUNTER — Ambulatory Visit (INDEPENDENT_AMBULATORY_CARE_PROVIDER_SITE_OTHER): Payer: Medicare Other | Admitting: General Surgery

## 2013-11-24 ENCOUNTER — Encounter (INDEPENDENT_AMBULATORY_CARE_PROVIDER_SITE_OTHER): Payer: Self-pay | Admitting: General Surgery

## 2013-11-24 VITALS — BP 130/76 | HR 72 | Temp 97.1°F | Ht 72.0 in | Wt 335.0 lb

## 2013-11-24 DIAGNOSIS — Z09 Encounter for follow-up examination after completed treatment for conditions other than malignant neoplasm: Secondary | ICD-10-CM

## 2013-11-24 NOTE — Progress Notes (Signed)
Subjective:     Patient ID: Marc Mills, male   DOB: 02/18/1946, 68 y.o.   MRN: [id number]  HPI The patient is status post right inguinal lymph node biopsy.The patient has had minimal to no pain postoperatively.  Review of Systems No fevers or chills. No drainage from his wound.    Objective:   Physical Exam His wound is healed extremely well with only some mild to moderate erythema along the incision line.induration or drainage. There is no palpable pocket seroma. Below as a pitcher of the actual incision site with new Steri-Strips in place.       Assessment:     Normal postoperative recovery status post right inguinal lymph node biopsy. Pathology was negative for lymphoma.  Mild erythema along the incision line, appears to be more of a reaction and not infection.     Plan:     The patient and can start back swimming in approximately one week with a plastic covering of his wound. We'll get him back in to see me within 2 weeks for wound check if I can be available. If not I will have him come back for nurse visit. He is not to start in his hypo-40s another month. He can start using his lawnmower which is riding mower.

## 2013-11-25 ENCOUNTER — Other Ambulatory Visit: Payer: Self-pay | Admitting: *Deleted

## 2013-11-25 DIAGNOSIS — Z87898 Personal history of other specified conditions: Secondary | ICD-10-CM

## 2013-11-28 ENCOUNTER — Other Ambulatory Visit (HOSPITAL_BASED_OUTPATIENT_CLINIC_OR_DEPARTMENT_OTHER): Payer: Medicare Other

## 2013-11-28 DIAGNOSIS — Z87898 Personal history of other specified conditions: Secondary | ICD-10-CM

## 2013-11-28 DIAGNOSIS — C8587 Other specified types of non-Hodgkin lymphoma, spleen: Secondary | ICD-10-CM

## 2013-11-28 LAB — COMPREHENSIVE METABOLIC PANEL (CC13)
ALT: 16 U/L (ref 0–55)
AST: 14 U/L (ref 5–34)
Albumin: 3.6 g/dL (ref 3.5–5.0)
Alkaline Phosphatase: 55 U/L (ref 40–150)
Anion Gap: 8 mEq/L (ref 3–11)
BUN: 22 mg/dL (ref 7.0–26.0)
CO2: 28 mEq/L (ref 22–29)
Calcium: 9.3 mg/dL (ref 8.4–10.4)
Chloride: 105 mEq/L (ref 98–109)
Creatinine: 0.8 mg/dL (ref 0.7–1.3)
Glucose: 113 mg/dl (ref 70–140)
Potassium: 4.5 mEq/L (ref 3.5–5.1)
Sodium: 141 mEq/L (ref 136–145)
Total Bilirubin: 0.28 mg/dL (ref 0.20–1.20)
Total Protein: 6.6 g/dL (ref 6.4–8.3)

## 2013-11-28 LAB — CBC WITH DIFFERENTIAL/PLATELET
BASO%: 1.4 % (ref 0.0–2.0)
Basophils Absolute: 0.1 10*3/uL (ref 0.0–0.1)
EOS%: 8.1 % — ABNORMAL HIGH (ref 0.0–7.0)
Eosinophils Absolute: 0.6 10*3/uL — ABNORMAL HIGH (ref 0.0–0.5)
HCT: 41 % (ref 38.4–49.9)
HGB: 13.6 g/dL (ref 13.0–17.1)
LYMPH%: 15.1 % (ref 14.0–49.0)
MCH: 29.8 pg (ref 27.2–33.4)
MCHC: 33.1 g/dL (ref 32.0–36.0)
MCV: 90 fL (ref 79.3–98.0)
MONO#: 1 10*3/uL — ABNORMAL HIGH (ref 0.1–0.9)
MONO%: 14.3 % — ABNORMAL HIGH (ref 0.0–14.0)
NEUT#: 4.3 10*3/uL (ref 1.5–6.5)
NEUT%: 61.1 % (ref 39.0–75.0)
Platelets: 308 10*3/uL (ref 140–400)
RBC: 4.55 10*6/uL (ref 4.20–5.82)
RDW: 14.4 % (ref 11.0–14.6)
WBC: 7.1 10*3/uL (ref 4.0–10.3)
lymph#: 1.1 10*3/uL (ref 0.9–3.3)

## 2013-11-30 ENCOUNTER — Ambulatory Visit (HOSPITAL_BASED_OUTPATIENT_CLINIC_OR_DEPARTMENT_OTHER): Payer: Medicare Other | Attending: Internal Medicine | Admitting: Radiology

## 2013-11-30 ENCOUNTER — Other Ambulatory Visit: Payer: Self-pay | Admitting: Physician Assistant

## 2013-11-30 VITALS — Ht 72.0 in | Wt 332.0 lb

## 2013-11-30 DIAGNOSIS — Z86718 Personal history of other venous thrombosis and embolism: Secondary | ICD-10-CM | POA: Insufficient documentation

## 2013-11-30 DIAGNOSIS — J449 Chronic obstructive pulmonary disease, unspecified: Secondary | ICD-10-CM | POA: Insufficient documentation

## 2013-11-30 DIAGNOSIS — I8390 Asymptomatic varicose veins of unspecified lower extremity: Secondary | ICD-10-CM | POA: Diagnosis not present

## 2013-11-30 DIAGNOSIS — L03314 Cellulitis of groin: Secondary | ICD-10-CM

## 2013-11-30 DIAGNOSIS — J4489 Other specified chronic obstructive pulmonary disease: Secondary | ICD-10-CM | POA: Insufficient documentation

## 2013-11-30 DIAGNOSIS — Z87898 Personal history of other specified conditions: Secondary | ICD-10-CM | POA: Diagnosis not present

## 2013-11-30 DIAGNOSIS — Z87891 Personal history of nicotine dependence: Secondary | ICD-10-CM | POA: Insufficient documentation

## 2013-11-30 DIAGNOSIS — Z9989 Dependence on other enabling machines and devices: Secondary | ICD-10-CM

## 2013-11-30 DIAGNOSIS — G4733 Obstructive sleep apnea (adult) (pediatric): Secondary | ICD-10-CM

## 2013-11-30 MED ORDER — SULFAMETHOXAZOLE-TMP DS 800-160 MG PO TABS: 1.0000 | ORAL_TABLET | Freq: Two times a day (BID) | ORAL | Status: DC

## 2013-12-01 ENCOUNTER — Ambulatory Visit (INDEPENDENT_AMBULATORY_CARE_PROVIDER_SITE_OTHER): Payer: Medicare Other | Admitting: General Surgery

## 2013-12-01 ENCOUNTER — Other Ambulatory Visit: Payer: Self-pay | Admitting: Oncology

## 2013-12-01 ENCOUNTER — Telehealth: Payer: Self-pay | Admitting: *Deleted

## 2013-12-01 ENCOUNTER — Encounter (INDEPENDENT_AMBULATORY_CARE_PROVIDER_SITE_OTHER): Payer: Self-pay | Admitting: General Surgery

## 2013-12-01 VITALS — BP 124/80 | HR 79 | Temp 98.8°F | Ht 72.0 in | Wt 339.0 lb

## 2013-12-01 DIAGNOSIS — L02419 Cutaneous abscess of limb, unspecified: Secondary | ICD-10-CM

## 2013-12-01 DIAGNOSIS — L03119 Cellulitis of unspecified part of limb: Principal | ICD-10-CM

## 2013-12-01 NOTE — Progress Notes (Signed)
  Marc Mills is a 68 y.o. male who is here for a follow up visit regarding his right leg cellulitis.  He reports this has worsened over the past couple days.  His PCP placed him on bactrim.  He is here today to see if this is related to his wound.  He has a 2 year h/o recurrent cellulitis.    Objective: Filed Vitals:   12/01/13 1618  BP: 124/80  Pulse: 79  Temp: 98.8 F (37.1 C)    General appearance: alert and cooperative Extremities: erythema throughout right medial thigh, no fluctuance palpated. Tenderness over medial thigh without tenderness over incision site, which is healing well.   See photos below  Assessment and Plan: Marc Mills is a 68 y.o. M who is s/p lymph node excision by Dr Hulen Skains.  He has developed cellulitis in the medial thigh on that side.  I do not palpate any fluid collections and it does not seem to involve the wound.  I have recommended that he f/u with his PCP in one week and call us if he develops fevers, worsening pain or erythema.      Rosario Adie, MD [facility], [place]

## 2013-12-01 NOTE — Patient Instructions (Signed)
Continue antibiotics.  Follow up with PCP or Dr Hulen Skains in 1 week.

## 2013-12-05 ENCOUNTER — Ambulatory Visit (HOSPITAL_BASED_OUTPATIENT_CLINIC_OR_DEPARTMENT_OTHER): Payer: Medicare Other | Admitting: Oncology

## 2013-12-05 ENCOUNTER — Telehealth: Payer: Self-pay | Admitting: Oncology

## 2013-12-05 VITALS — BP 142/68 | HR 70 | Temp 98.5°F | Resp 18 | Ht 72.0 in | Wt 335.0 lb

## 2013-12-05 DIAGNOSIS — L02419 Cutaneous abscess of limb, unspecified: Secondary | ICD-10-CM | POA: Diagnosis not present

## 2013-12-05 DIAGNOSIS — L03116 Cellulitis of left lower limb: Secondary | ICD-10-CM

## 2013-12-05 DIAGNOSIS — L03319 Cellulitis of trunk, unspecified: Secondary | ICD-10-CM

## 2013-12-05 DIAGNOSIS — L02219 Cutaneous abscess of trunk, unspecified: Secondary | ICD-10-CM

## 2013-12-05 DIAGNOSIS — J449 Chronic obstructive pulmonary disease, unspecified: Secondary | ICD-10-CM | POA: Diagnosis not present

## 2013-12-05 DIAGNOSIS — Z87898 Personal history of other specified conditions: Secondary | ICD-10-CM

## 2013-12-05 DIAGNOSIS — L03119 Cellulitis of unspecified part of limb: Secondary | ICD-10-CM | POA: Diagnosis not present

## 2013-12-05 DIAGNOSIS — L03115 Cellulitis of right lower limb: Secondary | ICD-10-CM

## 2013-12-05 DIAGNOSIS — Z86718 Personal history of other venous thrombosis and embolism: Secondary | ICD-10-CM

## 2013-12-05 DIAGNOSIS — L03314 Cellulitis of groin: Secondary | ICD-10-CM

## 2013-12-05 MED ORDER — SULFAMETHOXAZOLE-TMP DS 800-160 MG PO TABS: 1.0000 | ORAL_TABLET | Freq: Two times a day (BID) | ORAL | Status: DC
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
## 2013-12-05 NOTE — Telephone Encounter (Signed)
, °

## 2013-12-07 ENCOUNTER — Encounter: Payer: Self-pay | Admitting: Internal Medicine

## 2013-12-07 ENCOUNTER — Ambulatory Visit (INDEPENDENT_AMBULATORY_CARE_PROVIDER_SITE_OTHER): Payer: Medicare Other | Admitting: Internal Medicine

## 2013-12-07 VITALS — BP 134/84 | HR 71 | Temp 98.1°F | Ht 72.0 in | Wt 336.0 lb

## 2013-12-07 DIAGNOSIS — L02419 Cutaneous abscess of limb, unspecified: Secondary | ICD-10-CM

## 2013-12-07 DIAGNOSIS — L03119 Cellulitis of unspecified part of limb: Secondary | ICD-10-CM

## 2013-12-07 DIAGNOSIS — L03115 Cellulitis of right lower limb: Secondary | ICD-10-CM

## 2013-12-07 MED ORDER — CEPHALEXIN 500 MG PO CAPS: 500.0000 mg | ORAL_CAPSULE | Freq: Four times a day (QID) | ORAL | Status: DC

## 2013-12-07 MED ORDER — DOXYCYCLINE HYCLATE 100 MG PO TABS: 100.0000 mg | ORAL_TABLET | Freq: Two times a day (BID) | ORAL | Status: DC

## 2013-12-07 NOTE — Assessment & Plan Note (Signed)
Drainage does not appear to be pus so I don't see immediate need for I and D.  To see surgery in 2 days.  It does appear to be cellulitic.  It is improving on Bactrim but with the coumadin, I am concerned with the interaction (lower INR) and so will change to doxycycline and use keflex as well to cover Strep.  I will give Marc Mills a months supply and he will follow up in about 2 weeks to see how it has improved.     I discussed ways of trying to reduce number of episodes of cellulitis including weight loss, foot care and he is already doing those.  Also discussed vascular insufficiency and lymphectomy as contributing.    RTC 2 weeks.
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
## 2013-12-09 ENCOUNTER — Encounter (INDEPENDENT_AMBULATORY_CARE_PROVIDER_SITE_OTHER): Payer: Self-pay | Admitting: General Surgery

## 2013-12-09 ENCOUNTER — Ambulatory Visit (INDEPENDENT_AMBULATORY_CARE_PROVIDER_SITE_OTHER): Payer: Medicare Other | Admitting: General Surgery

## 2013-12-09 VITALS — BP 140/80 | HR 72 | Temp 97.2°F | Resp 16 | Ht 72.0 in | Wt 334.0 lb

## 2013-12-09 DIAGNOSIS — IMO0001 Reserved for inherently not codable concepts without codable children: Secondary | ICD-10-CM

## 2013-12-09 DIAGNOSIS — T8140XA Infection following a procedure, unspecified, initial encounter: Secondary | ICD-10-CM

## 2013-12-09 DIAGNOSIS — T8149XA Infection following a procedure, other surgical site, initial encounter: Secondary | ICD-10-CM | POA: Insufficient documentation

## 2013-12-09 DIAGNOSIS — T814XXA Infection following a procedure, initial encounter: Principal | ICD-10-CM

## 2013-12-09 NOTE — Progress Notes (Signed)
Subjective:     Patient ID: Marc Mills, male   DOB: 1946-03-30, 68 y.o.   MRN: [id number]  HPI The patient has a large seroma associated with cellulitis of the right thigh wound. He has been started on Keflex and doxycycline.  Review of Systems Lots of clear drainage from his right groin wound. No fevers or chills.    Objective:   Physical Exam The patient has a large erythematous warm measuring approximately 4 x 12 cm in size with a depth of approximately 5 cm. I was able to open the lateral aspect of the wound and drain out several cc probably over 50-60 cc of clear fluid. There was no pus. No significant relief to the patient with drainage of the fluid. I subsequently packed just the corner aspect of the balloon with a single saline soaked 4 x 4 gauze. We instructed the wife to do so twice a day. She should do so using a sterile Q-tip and sterile saline.    Assessment:     Large wound seroma associated with cellulitis and possible infection.     Plan:     #1 continue oral antibiotics.  #2 wet-to-dry dressings twice a day with sterile saline and 4 x 4 gauze. #3 have the patient come to see me again in the near future.

## 2013-12-16 ENCOUNTER — Ambulatory Visit (INDEPENDENT_AMBULATORY_CARE_PROVIDER_SITE_OTHER): Payer: Medicare Other | Admitting: General Surgery

## 2013-12-16 ENCOUNTER — Encounter (INDEPENDENT_AMBULATORY_CARE_PROVIDER_SITE_OTHER): Payer: Self-pay | Admitting: General Surgery

## 2013-12-16 VITALS — BP 124/80 | HR 74 | Resp 18 | Ht 72.0 in | Wt 330.8 lb

## 2013-12-16 DIAGNOSIS — T814XXD Infection following a procedure, subsequent encounter: Principal | ICD-10-CM

## 2013-12-16 DIAGNOSIS — IMO0001 Reserved for inherently not codable concepts without codable children: Secondary | ICD-10-CM

## 2013-12-16 DIAGNOSIS — Z5189 Encounter for other specified aftercare: Secondary | ICD-10-CM

## 2013-12-16 DIAGNOSIS — T8140XA Infection following a procedure, unspecified, initial encounter: Secondary | ICD-10-CM

## 2013-12-16 NOTE — Progress Notes (Signed)
Subjective:     Patient ID: Marc Mills, male   DOB: 04/13/1946, 68 y.o.   MRN: [id number]  HPI The patient's wound is healing well but it is still very deep  Review of Systems Erythema has improved, but still has some erythema along the thigh anteriorly.    Objective:   Physical Exam No purulent drainage.  Deep cavity tunneling medially, but clean.  Wound repacked and covered wet to dry with sterile    Assessment:     Deep open wound, on one month of antibiotics.  Seemed sterile on my initial examination.       Plan:     See patient again in one month.  Continue dressing changes bid.

## 2013-12-17 DIAGNOSIS — G4733 Obstructive sleep apnea (adult) (pediatric): Secondary | ICD-10-CM | POA: Diagnosis not present

## 2013-12-17 NOTE — Sleep Study (Signed)
   NAME: Marc Mills DATE OF BIRTH:  19-Sep-1945 MEDICAL RECORD NUMBER [id number]  LOCATION: [facility]  PHYSICIAN: Arleth Mccullar D  DATE OF STUDY: 11/30/2013  SLEEP STUDY TYPE: Nocturnal Polysomnogram               REFERRING PHYSICIAN: Baird Lyons D, MD  INDICATION FOR STUDY: Hypersomnia with sleep apnea  EPWORTH SLEEPINESS SCORE:   8/24 HEIGHT: 6' (182.9 cm)  WEIGHT: 332 lb (150.594 kg)    Body mass index is 45.02 kg/(m^2).  NECK SIZE: 17 in.  MEDICATIONS: Charted for review  SLEEP ARCHITECTURE: Total sleep time 214 minutes with sleep efficiency 57.4%. Stage I was 28.3%, stage II 71.7%, stage III and REM were absent. Sleep latency 10.5 minutes, awake after sleep onset 151 minutes, arousal index 68.7, bedtime medication: Coumadin, citalopram  RESPIRATORY DATA: Apnea hypopnea index (AHI) 32.8 per hour. 117 total events scored including 30 obstructive apneas, 1 central apnea, 2 mixed apneas, 84 hypopneas. There were not enough early events to permit application of split protocol CPAP titration.  OXYGEN DATA: Moderate snoring with oxygen desaturation to a nadir of 85% and mean saturation 92.9% on room air  CARDIAC DATA: Sinus rhythm with PVCs  MOVEMENT/PARASOMNIA: Periodic limb movement. 249 limb jerks counted of which 25 were associated with arousal or awakening for a periodic limb movement with arousal index of 7 per hour, bathroom x4  IMPRESSION/ RECOMMENDATION:   1) Severe obstructive sleep apnea/hypopneas syndrome, AHI 32.8 per hour with events in all sleep positions. Moderate snoring with oxygen desaturation to a nadir of 85% and mean saturation 92.9% on room air. 2) There were not enough early respiratory events and not enough sustained sleep to meet protocol requirements for split CPAP titration. 3) Periodic Limb Movement Syndrome. 249 limb jerks were counted of which 25 were associated with arousal or awakening for a periodic limb movement with  arousal index of 7 per hour. Some of these may have been the result of respiratory arousal.  4) Throughout the study, sleep was fragmented by frequent awakenings. Some may have been from respiratory pattern or limb jerks but note that he also had to use the restroom 4 times  Deneise Lever Diplomate, American Board of Sleep Medicine  ELECTRONICALLY SIGNED ON:  12/17/2013, 10:59 AM [place] PH: [phone]   FX: [phone] [place]

## 2013-12-19 ENCOUNTER — Ambulatory Visit (INDEPENDENT_AMBULATORY_CARE_PROVIDER_SITE_OTHER): Payer: Medicare Other

## 2013-12-19 DIAGNOSIS — Z86718 Personal history of other venous thrombosis and embolism: Secondary | ICD-10-CM | POA: Diagnosis not present

## 2013-12-19 DIAGNOSIS — Z5181 Encounter for therapeutic drug level monitoring: Secondary | ICD-10-CM | POA: Diagnosis not present

## 2013-12-19 DIAGNOSIS — Z792 Long term (current) use of antibiotics: Secondary | ICD-10-CM | POA: Diagnosis not present

## 2013-12-19 LAB — POCT INR: INR: 3.4

## 2013-12-22 ENCOUNTER — Encounter: Payer: Self-pay | Admitting: Internal Medicine

## 2013-12-22 ENCOUNTER — Ambulatory Visit (INDEPENDENT_AMBULATORY_CARE_PROVIDER_SITE_OTHER): Payer: Medicare Other | Admitting: Internal Medicine

## 2013-12-22 VITALS — BP 138/87 | HR 62 | Temp 98.1°F | Wt 333.0 lb

## 2013-12-22 DIAGNOSIS — L02419 Cutaneous abscess of limb, unspecified: Secondary | ICD-10-CM | POA: Diagnosis not present

## 2013-12-22 DIAGNOSIS — Z23 Encounter for immunization: Secondary | ICD-10-CM | POA: Diagnosis not present

## 2013-12-22 DIAGNOSIS — L03119 Cellulitis of unspecified part of limb: Secondary | ICD-10-CM

## 2013-12-22 DIAGNOSIS — L03115 Cellulitis of right lower limb: Secondary | ICD-10-CM
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
## 2013-12-22 NOTE — Assessment & Plan Note (Signed)
Improving well.  No pus and I agree with Dr Hulen Skains that it appears sterile.  Will finish the antibiotics for the month due to recurrent issues and stop.   I also discussed how best to prevent recurrence.  His onychomycosis could be an issue but with coumadin, would not be good to treat with oral therapy and will continue with topical therapy.  Weight loss, and activity can help.  He is already doing that.  Hopefully will not recurr now.  Not much else to offer for prevention that he is not already doing.    He can return on a PRN basis.

## 2014-01-02 ENCOUNTER — Ambulatory Visit (INDEPENDENT_AMBULATORY_CARE_PROVIDER_SITE_OTHER): Payer: Medicare Other

## 2014-01-02 DIAGNOSIS — Z5181 Encounter for therapeutic drug level monitoring: Secondary | ICD-10-CM | POA: Diagnosis not present

## 2014-01-02 DIAGNOSIS — Z86718 Personal history of other venous thrombosis and embolism: Secondary | ICD-10-CM

## 2014-01-02 DIAGNOSIS — Z792 Long term (current) use of antibiotics: Secondary | ICD-10-CM

## 2014-01-02 LAB — POCT INR: INR: 2.3

## 2014-01-16 ENCOUNTER — Encounter (INDEPENDENT_AMBULATORY_CARE_PROVIDER_SITE_OTHER): Payer: Medicare Other | Admitting: General Surgery

## 2014-01-23 ENCOUNTER — Ambulatory Visit (INDEPENDENT_AMBULATORY_CARE_PROVIDER_SITE_OTHER): Payer: Medicare Other | Admitting: *Deleted

## 2014-01-23 DIAGNOSIS — Z86718 Personal history of other venous thrombosis and embolism: Secondary | ICD-10-CM

## 2014-01-23 DIAGNOSIS — Z792 Long term (current) use of antibiotics: Secondary | ICD-10-CM

## 2014-01-23 DIAGNOSIS — Z5181 Encounter for therapeutic drug level monitoring: Secondary | ICD-10-CM

## 2014-01-23 LAB — POCT INR: INR: 2.1

## 2014-02-20 ENCOUNTER — Ambulatory Visit (INDEPENDENT_AMBULATORY_CARE_PROVIDER_SITE_OTHER): Payer: Medicare Other

## 2014-02-20 DIAGNOSIS — Z86718 Personal history of other venous thrombosis and embolism: Secondary | ICD-10-CM

## 2014-02-20 DIAGNOSIS — Z792 Long term (current) use of antibiotics: Secondary | ICD-10-CM

## 2014-02-20 DIAGNOSIS — Z5181 Encounter for therapeutic drug level monitoring: Secondary | ICD-10-CM | POA: Diagnosis not present

## 2014-02-20 LAB — POCT INR: INR: 1.2

## 2014-02-22 DIAGNOSIS — Z96652 Presence of left artificial knee joint: Secondary | ICD-10-CM | POA: Diagnosis not present

## 2014-02-22 DIAGNOSIS — M1712 Unilateral primary osteoarthritis, left knee: Secondary | ICD-10-CM | POA: Diagnosis not present

## 2014-02-22 DIAGNOSIS — L8990 Pressure ulcer of unspecified site, unspecified stage: Secondary | ICD-10-CM | POA: Diagnosis not present

## 2014-02-23 ENCOUNTER — Ambulatory Visit (INDEPENDENT_AMBULATORY_CARE_PROVIDER_SITE_OTHER): Payer: Medicare Other | Admitting: Family Medicine

## 2014-02-23 VITALS — BP 145/83 | HR 62 | Temp 97.6°F | Resp 16 | Ht 70.5 in | Wt 334.0 lb

## 2014-02-23 DIAGNOSIS — H6123 Impacted cerumen, bilateral: Secondary | ICD-10-CM | POA: Diagnosis not present

## 2014-02-23 DIAGNOSIS — H60392 Other infective otitis externa, left ear: Secondary | ICD-10-CM | POA: Diagnosis not present

## 2014-02-23 MED ORDER — NEOMYCIN-POLYMYXIN-HC 3.5-10000-1 OT SOLN: 3.0000 [drp] | Freq: Four times a day (QID) | OTIC | Status: DC

## 2014-02-23 NOTE — Patient Instructions (Addendum)
Last wellness exam was 03/21/2013

## 2014-02-23 NOTE — Progress Notes (Signed)
This chart was scribed for Robyn Haber, MD by Edison Simon, ED Scribe. This patient was seen in room 12 and the patient's care was started at 10:57 AM.   Patient ID: Marc Mills MRN: [id number], DOB: 08/07/1945, 68 y.o. Date of Encounter: 02/23/2014, 10:57 AM  Primary Physician: Jenny Reichmann, MD  Chief Complaint: Cerumen impaction  HPI: 68 y.o. year old male with history below presents cerumen impaction, with prior cases of cerumen impaction. He states he has an appointment with [facility] about his hearing aid, which he has been using for some time, and was told to be seen here first. He states that he has noticed decreased hearing, but is not sure if it's from his impaction, hearing aid, or change in hearing function.  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  Past Medical History  Diagnosis Date   Morbid obesity    History of pulmonary embolus (PE)    History of lymphoma    Incisional hernia    Arthritis    Blood transfusion without reported diagnosis    DVT (deep venous thrombosis)     2011 right leg   Anorectal fistula    OSA on CPAP     cpap  10 yrs   Varicose veins of lower extremities     pe   Cancer     lymphoma hx   Cataract    Cellulitis 04/04/2013     Home Meds: Prior to Admission medications   Medication Sig Start Date End Date Taking? Authorizing Provider  acetaminophen (TYLENOL) 500 MG tablet Take 1,000 mg by mouth every 6 (six) hours as needed for mild pain.   Yes Historical Provider, MD  b complex vitamins tablet Take 1 tablet by mouth at bedtime.    Yes Historical Provider, MD  calcium carbonate (OS-CAL) 600 MG TABS Take 600 mg by mouth at bedtime.    Yes Historical Provider, MD  cholecalciferol (VITAMIN D) 1000 UNITS tablet Take 1,000 Units by mouth at bedtime.    Yes Historical Provider, MD  Cinnamon 500 MG capsule Take 500  mg by mouth daily.   Yes Historical Provider, MD  citalopram (CELEXA) 20 MG tablet Take 20 mg by mouth daily.   Yes Historical Provider, MD  Misc Natural Products (GLUCOS-CHONDROIT-MSM COMPLEX) TABS Take 1 tablet by mouth daily.   Yes Historical Provider, MD  Multiple Vitamin (MULTIVITAMIN) tablet Take 1 tablet by mouth at bedtime.    Yes Historical Provider, MD  sildenafil (VIAGRA) 100 MG tablet Take 1 tablet (100 mg total) by mouth daily as needed for erectile dysfunction. Splits the tablet in half prn 05/13/13  Yes [name], MD  vitamin E 400 UNIT capsule Take 400 Units by mouth daily.   Yes Historical Provider, MD  warfarin (COUMADIN) 5 MG tablet Take 5 mg by mouth. As directed   Yes Historical Provider, MD    Allergies:  Allergies  Allergen Reactions   Hydrocodone Itching and Other (See Comments)    Gives me chills   Oxycodone Itching and Other (See Comments)    Gives me chills    History   Social History   Marital Status: Married    Spouse Name: [name]    Number of Children: [name]   Years of Education: [name]   Occupational History   Not on file.   Social History Main Topics  Smoking status: Former Smoker -- 2.00 packs/day for 20 years    Types: Cigarettes    Quit date: 05/05/1984   Smokeless tobacco: Never Used   Alcohol Use: No   Drug Use: No   Sexual Activity: Yes    Museum/gallery curator: None   Other Topics Concern   Not on file   Social History Narrative   No narrative on file     Review of Systems: Constitutional: negative for chills, fever, night sweats, weight changes, or fatigue  HEENT: negative for vision changes, congestion, rhinorrhea, ST, epistaxis, or sinus pressure. Positive for decreased hearing and cerumen impaction Cardiovascular: negative for chest pain or palpitations Respiratory: negative for hemoptysis, wheezing, shortness of breath, or cough Abdominal: negative for abdominal pain, nausea, or vomiting Dermatological:  negative for rash Neurologic: negative for headache, dizziness, or syncope   Physical Exam: Blood pressure 145/83, pulse 62, temperature 97.6 F (36.4 C), resp. rate 16, height 5' 10.5" (1.791 m), weight 334 lb (151.501 kg), SpO2 99.00%., Body mass index is 47.23 kg/(m^2). General: Well developed, well nourished, in no acute distress. Head: Normocephalic, atraumatic, eyes without discharge, sclera non-icteric, nares are without discharge. Both auditory canals with cerumen impaction. S/P ear lavage  auditory canals clear. Bilateral TM's are without perforation, pearly grey and translucent with reflective cone of light bilaterally. There is however some fluid and scarring on the left eardrum. No mastoid TTP. Oral cavity moist, posterior pharynx without exudate, erythema, peritonsillar abscess, or post nasal drip.  Neck: Supple. No thyromegaly. Full ROM. No lymphadenopathy. Lungs: Clear bilaterally to auscultation without wheezes, rales, or rhonchi. Breathing is unlabored. Heart: RRR with S1 S2. No murmurs, rubs, or gallops appreciated. Msk:  Strength and tone normal for age. Extremities/Skin: Warm and dry. No clubbing or cyanosis. No edema. No rashes or suspicious lesions. Neuro: Alert and oriented X 3. Moves all extremities spontaneously. Gait is normal. CNII-XII grossly in tact. Psych:  Responds to questions appropriately with a normal affect.   Ear lavage performed by: Davina   ASSESSMENT AND PLAN:  68 y.o. year old male with cerumen impaction s/p ear lavage. Cerumen impaction, bilateral  Otitis, externa, infective, left - Plan: neomycin-polymyxin-hydrocortisone (CORTISPORIN) otic solution   -Resolved -Ear lavage per above -RTC prn  Signed, Robyn Haber, MD 02/23/2014 10:57 AM

## 2014-03-02 ENCOUNTER — Ambulatory Visit (INDEPENDENT_AMBULATORY_CARE_PROVIDER_SITE_OTHER): Payer: Medicare Other | Admitting: *Deleted

## 2014-03-02 DIAGNOSIS — Z86718 Personal history of other venous thrombosis and embolism: Secondary | ICD-10-CM | POA: Diagnosis not present

## 2014-03-02 DIAGNOSIS — Z792 Long term (current) use of antibiotics: Secondary | ICD-10-CM

## 2014-03-02 DIAGNOSIS — Z5181 Encounter for therapeutic drug level monitoring: Secondary | ICD-10-CM

## 2014-03-02 LAB — POCT INR: INR: 2.3

## 2014-03-07 ENCOUNTER — Other Ambulatory Visit (HOSPITAL_BASED_OUTPATIENT_CLINIC_OR_DEPARTMENT_OTHER): Payer: Medicare Other

## 2014-03-07 DIAGNOSIS — L03116 Cellulitis of left lower limb: Secondary | ICD-10-CM | POA: Diagnosis not present

## 2014-03-07 DIAGNOSIS — L03329 Acute lymphangitis of trunk, unspecified: Secondary | ICD-10-CM | POA: Diagnosis not present

## 2014-03-07 DIAGNOSIS — Z8579 Personal history of other malignant neoplasms of lymphoid, hematopoietic and related tissues: Secondary | ICD-10-CM | POA: Diagnosis not present

## 2014-03-07 DIAGNOSIS — Z87898 Personal history of other specified conditions: Secondary | ICD-10-CM

## 2014-03-07 DIAGNOSIS — J449 Chronic obstructive pulmonary disease, unspecified: Secondary | ICD-10-CM

## 2014-03-07 DIAGNOSIS — L03314 Cellulitis of groin: Secondary | ICD-10-CM

## 2014-03-07 DIAGNOSIS — L03115 Cellulitis of right lower limb: Secondary | ICD-10-CM | POA: Diagnosis not present

## 2014-03-07 DIAGNOSIS — L03129 Acute lymphangitis of unspecified part of limb: Secondary | ICD-10-CM | POA: Diagnosis not present

## 2014-03-07 LAB — CBC WITH DIFFERENTIAL/PLATELET
BASO%: 0.7 % (ref 0.0–2.0)
Basophils Absolute: 0.1 10*3/uL (ref 0.0–0.1)
EOS%: 7.2 % — ABNORMAL HIGH (ref 0.0–7.0)
Eosinophils Absolute: 0.5 10*3/uL (ref 0.0–0.5)
HCT: 41.4 % (ref 38.4–49.9)
HGB: 13.8 g/dL (ref 13.0–17.1)
LYMPH%: 14.8 % (ref 14.0–49.0)
MCH: 29.9 pg (ref 27.2–33.4)
MCHC: 33.3 g/dL (ref 32.0–36.0)
MCV: 89.6 fL (ref 79.3–98.0)
MONO#: 1 10*3/uL — ABNORMAL HIGH (ref 0.1–0.9)
MONO%: 13.1 % (ref 0.0–14.0)
NEUT#: 4.7 10*3/uL (ref 1.5–6.5)
NEUT%: 64.2 % (ref 39.0–75.0)
Platelets: 275 10*3/uL (ref 140–400)
RBC: 4.62 10*6/uL (ref 4.20–5.82)
RDW: 15.9 % — ABNORMAL HIGH (ref 11.0–14.6)
WBC: 7.3 10*3/uL (ref 4.0–10.3)
lymph#: 1.1 10*3/uL (ref 0.9–3.3)

## 2014-03-07 LAB — COMPREHENSIVE METABOLIC PANEL (CC13)
ALT: 21 U/L (ref 0–55)
AST: 17 U/L (ref 5–34)
Albumin: 3.7 g/dL (ref 3.5–5.0)
Alkaline Phosphatase: 63 U/L (ref 40–150)
Anion Gap: 9 mEq/L (ref 3–11)
BUN: 16.9 mg/dL (ref 7.0–26.0)
CO2: 25 mEq/L (ref 22–29)
Calcium: 9.2 mg/dL (ref 8.4–10.4)
Chloride: 106 mEq/L (ref 98–109)
Creatinine: 0.7 mg/dL (ref 0.7–1.3)
Glucose: 100 mg/dl (ref 70–140)
Potassium: 4.6 mEq/L (ref 3.5–5.1)
Sodium: 140 mEq/L (ref 136–145)
Total Bilirubin: 0.36 mg/dL (ref 0.20–1.20)
Total Protein: 6.7 g/dL (ref 6.4–8.3)

## 2014-03-07 LAB — LACTATE DEHYDROGENASE (CC13): LDH: 102 U/L — ABNORMAL LOW (ref 125–245)

## 2014-03-09 LAB — BETA 2 MICROGLOBULIN, SERUM: Beta-2 Microglobulin: 2.06 mg/L (ref <=2.51)

## 2014-03-14 ENCOUNTER — Telehealth: Payer: Self-pay | Admitting: Oncology

## 2014-03-14 ENCOUNTER — Ambulatory Visit (HOSPITAL_BASED_OUTPATIENT_CLINIC_OR_DEPARTMENT_OTHER): Payer: Medicare Other | Admitting: Oncology

## 2014-03-14 VITALS — BP 141/74 | HR 64 | Temp 97.8°F | Resp 18 | Ht 70.5 in | Wt 333.7 lb

## 2014-03-14 DIAGNOSIS — Z8579 Personal history of other malignant neoplasms of lymphoid, hematopoietic and related tissues: Secondary | ICD-10-CM | POA: Diagnosis not present

## 2014-03-14 DIAGNOSIS — C8307 Small cell B-cell lymphoma, spleen: Secondary | ICD-10-CM

## 2014-03-14 DIAGNOSIS — Q858 Other phakomatoses, not elsewhere classified: Secondary | ICD-10-CM | POA: Diagnosis not present

## 2014-03-14 NOTE — Telephone Encounter (Signed)
per pof to sch pt appt-gave copy of sch °
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
## 2014-03-16 NOTE — Addendum Note (Signed)
Addended by: Laureen Abrahams on: 03/16/2014 05:49 PM   Modules accepted: Medications

## 2014-04-01 ENCOUNTER — Other Ambulatory Visit: Payer: Self-pay | Admitting: Emergency Medicine

## 2014-04-03 ENCOUNTER — Ambulatory Visit (INDEPENDENT_AMBULATORY_CARE_PROVIDER_SITE_OTHER): Payer: Medicare Other

## 2014-04-03 DIAGNOSIS — Z5181 Encounter for therapeutic drug level monitoring: Secondary | ICD-10-CM | POA: Diagnosis not present

## 2014-04-03 DIAGNOSIS — Z792 Long term (current) use of antibiotics: Secondary | ICD-10-CM | POA: Diagnosis not present

## 2014-04-03 DIAGNOSIS — Z86718 Personal history of other venous thrombosis and embolism: Secondary | ICD-10-CM | POA: Diagnosis not present

## 2014-04-03 LAB — POCT INR: INR: 1.7

## 2014-04-04 ENCOUNTER — Ambulatory Visit (INDEPENDENT_AMBULATORY_CARE_PROVIDER_SITE_OTHER): Payer: Medicare Other | Admitting: Emergency Medicine

## 2014-04-04 ENCOUNTER — Encounter: Payer: Self-pay | Admitting: Emergency Medicine

## 2014-04-04 ENCOUNTER — Other Ambulatory Visit: Payer: Self-pay | Admitting: Emergency Medicine

## 2014-04-04 VITALS — BP 144/84 | HR 63 | Temp 97.7°F | Resp 16 | Ht 70.5 in | Wt 334.0 lb

## 2014-04-04 DIAGNOSIS — Z125 Encounter for screening for malignant neoplasm of prostate: Secondary | ICD-10-CM | POA: Diagnosis not present

## 2014-04-04 DIAGNOSIS — E785 Hyperlipidemia, unspecified: Secondary | ICD-10-CM | POA: Diagnosis not present

## 2014-04-04 DIAGNOSIS — Z1329 Encounter for screening for other suspected endocrine disorder: Secondary | ICD-10-CM

## 2014-04-04 DIAGNOSIS — Z1389 Encounter for screening for other disorder: Secondary | ICD-10-CM

## 2014-04-04 DIAGNOSIS — Z0000 Encounter for general adult medical examination without abnormal findings: Secondary | ICD-10-CM | POA: Diagnosis not present

## 2014-04-04 DIAGNOSIS — R635 Abnormal weight gain: Secondary | ICD-10-CM

## 2014-04-04 LAB — LIPID PANEL
Cholesterol: 180 mg/dL (ref 0–200)
HDL: 50 mg/dL (ref >39)
LDL Cholesterol: 105 mg/dL — ABNORMAL HIGH (ref 0–99)
Total CHOL/HDL Ratio: 3.6 Ratio
Triglycerides: 123 mg/dL (ref <150)
VLDL: 25 mg/dL (ref 0–40)

## 2014-04-04 LAB — POCT URINALYSIS DIPSTICK
Bilirubin, UA: NEGATIVE
Glucose, UA: NEGATIVE
Ketones, UA: NEGATIVE
Leukocytes, UA: NEGATIVE
Nitrite, UA: NEGATIVE
Protein, UA: NEGATIVE
Spec Grav, UA: 1.015
Urobilinogen, UA: 0.2
pH, UA: 5.5

## 2014-04-04 LAB — T4, FREE: Free T4: 0.82 ng/dL (ref 0.80–1.80)

## 2014-04-04 LAB — TSH: TSH: 1.44 u[IU]/mL (ref 0.350–4.500)
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
## 2014-04-05 LAB — COMPREHENSIVE METABOLIC PANEL
ALT: 16 U/L (ref 0–53)
AST: 17 U/L (ref 0–37)
Albumin: 4.3 g/dL (ref 3.5–5.2)
Alkaline Phosphatase: 59 U/L (ref 39–117)
BUN: 16 mg/dL (ref 6–23)
CO2: 27 mEq/L (ref 19–32)
Calcium: 9.3 mg/dL (ref 8.4–10.5)
Chloride: 103 mEq/L (ref 96–112)
Creat: 0.55 mg/dL (ref 0.50–1.35)
Glucose, Bld: 79 mg/dL (ref 70–99)
Potassium: 4.7 mEq/L (ref 3.5–5.3)
Sodium: 140 mEq/L (ref 135–145)
Total Bilirubin: 0.4 mg/dL (ref 0.2–1.2)
Total Protein: 7 g/dL (ref 6.0–8.3)

## 2014-04-05 LAB — PSA, MEDICARE: PSA: 0.41 ng/mL (ref <=4.00)

## 2014-04-24 ENCOUNTER — Ambulatory Visit (INDEPENDENT_AMBULATORY_CARE_PROVIDER_SITE_OTHER): Payer: Medicare Other | Admitting: Surgery

## 2014-04-24 DIAGNOSIS — Z5181 Encounter for therapeutic drug level monitoring: Secondary | ICD-10-CM | POA: Diagnosis not present

## 2014-04-24 DIAGNOSIS — Z792 Long term (current) use of antibiotics: Secondary | ICD-10-CM

## 2014-04-24 DIAGNOSIS — Z86718 Personal history of other venous thrombosis and embolism: Secondary | ICD-10-CM

## 2014-04-24 LAB — POCT INR: INR: 1.7

## 2014-04-29 ENCOUNTER — Telehealth: Payer: Self-pay | Admitting: Physician Assistant

## 2014-04-29 MED ORDER — SULFAMETHOXAZOLE-TRIMETHOPRIM 800-160 MG PO TABS: 1.0000 | ORAL_TABLET | Freq: Two times a day (BID) | ORAL | Status: AC

## 2014-04-29 NOTE — Telephone Encounter (Signed)
Patient called with early recurrent cellulitis. Requests antibiotics.  Meds ordered this encounter  Medications  . sulfamethoxazole-trimethoprim (BACTRIM DS,SEPTRA DS) 800-160 MG per tablet    Sig: Take 1 tablet by mouth 2 (two) times daily.    Dispense:  20 tablet    Refill:  0    Order Specific Question:  Supervising Provider    Answer:  Leandrew Koyanagi [[id number]]   Patient will contact the coumadin clinic to let them know about this prescription so that PT/INR can be managed appropriately.

## 2014-05-01 ENCOUNTER — Other Ambulatory Visit: Payer: Self-pay | Admitting: Emergency Medicine

## 2014-05-02 ENCOUNTER — Other Ambulatory Visit: Payer: Self-pay | Admitting: Emergency Medicine

## 2014-05-04 ENCOUNTER — Ambulatory Visit (INDEPENDENT_AMBULATORY_CARE_PROVIDER_SITE_OTHER): Payer: Medicare Other | Admitting: *Deleted

## 2014-05-04 ENCOUNTER — Other Ambulatory Visit: Payer: Self-pay | Admitting: Emergency Medicine

## 2014-05-04 DIAGNOSIS — Z86718 Personal history of other venous thrombosis and embolism: Secondary | ICD-10-CM

## 2014-05-04 DIAGNOSIS — Z5181 Encounter for therapeutic drug level monitoring: Secondary | ICD-10-CM

## 2014-05-04 DIAGNOSIS — Z792 Long term (current) use of antibiotics: Secondary | ICD-10-CM | POA: Diagnosis not present

## 2014-05-04 LAB — POCT INR: INR: 2.4

## 2014-05-22 ENCOUNTER — Ambulatory Visit (INDEPENDENT_AMBULATORY_CARE_PROVIDER_SITE_OTHER): Payer: Medicare Other

## 2014-05-22 DIAGNOSIS — Z792 Long term (current) use of antibiotics: Secondary | ICD-10-CM | POA: Diagnosis not present

## 2014-05-22 DIAGNOSIS — Z86718 Personal history of other venous thrombosis and embolism: Secondary | ICD-10-CM | POA: Diagnosis not present

## 2014-05-22 DIAGNOSIS — Z5181 Encounter for therapeutic drug level monitoring: Secondary | ICD-10-CM

## 2014-05-22 LAB — POCT INR: INR: 2.2

## 2014-06-15 DIAGNOSIS — H25041 Posterior subcapsular polar age-related cataract, right eye: Secondary | ICD-10-CM | POA: Diagnosis not present

## 2014-06-15 DIAGNOSIS — Z961 Presence of intraocular lens: Secondary | ICD-10-CM | POA: Diagnosis not present

## 2014-06-15 DIAGNOSIS — H2511 Age-related nuclear cataract, right eye: Secondary | ICD-10-CM | POA: Diagnosis not present

## 2014-06-21 ENCOUNTER — Ambulatory Visit (INDEPENDENT_AMBULATORY_CARE_PROVIDER_SITE_OTHER): Payer: Medicare Other | Admitting: *Deleted

## 2014-06-21 ENCOUNTER — Other Ambulatory Visit (HOSPITAL_BASED_OUTPATIENT_CLINIC_OR_DEPARTMENT_OTHER): Payer: Medicare Other

## 2014-06-21 DIAGNOSIS — Z86718 Personal history of other venous thrombosis and embolism: Secondary | ICD-10-CM

## 2014-06-21 DIAGNOSIS — Z792 Long term (current) use of antibiotics: Secondary | ICD-10-CM | POA: Diagnosis not present

## 2014-06-21 DIAGNOSIS — L03115 Cellulitis of right lower limb: Secondary | ICD-10-CM

## 2014-06-21 DIAGNOSIS — L03116 Cellulitis of left lower limb: Secondary | ICD-10-CM

## 2014-06-21 DIAGNOSIS — L03329 Acute lymphangitis of trunk, unspecified: Secondary | ICD-10-CM | POA: Diagnosis not present

## 2014-06-21 DIAGNOSIS — Z5181 Encounter for therapeutic drug level monitoring: Secondary | ICD-10-CM | POA: Diagnosis not present

## 2014-06-21 DIAGNOSIS — C8587 Other specified types of non-Hodgkin lymphoma, spleen: Secondary | ICD-10-CM | POA: Diagnosis not present

## 2014-06-21 DIAGNOSIS — Z87898 Personal history of other specified conditions: Secondary | ICD-10-CM

## 2014-06-21 DIAGNOSIS — J449 Chronic obstructive pulmonary disease, unspecified: Secondary | ICD-10-CM

## 2014-06-21 DIAGNOSIS — Z8579 Personal history of other malignant neoplasms of lymphoid, hematopoietic and related tissues: Secondary | ICD-10-CM | POA: Diagnosis not present

## 2014-06-21 DIAGNOSIS — L03314 Cellulitis of groin: Secondary | ICD-10-CM

## 2014-06-21 DIAGNOSIS — L03129 Acute lymphangitis of unspecified part of limb: Secondary | ICD-10-CM | POA: Diagnosis not present

## 2014-06-21 LAB — CBC WITH DIFFERENTIAL/PLATELET
BASO%: 0.6 % (ref 0.0–2.0)
Basophils Absolute: 0 10e3/uL (ref 0.0–0.1)
EOS%: 7.2 % — ABNORMAL HIGH (ref 0.0–7.0)
Eosinophils Absolute: 0.5 10e3/uL (ref 0.0–0.5)
HCT: 42.8 % (ref 38.4–49.9)
HGB: 14.3 g/dL (ref 13.0–17.1)
LYMPH%: 16.4 % (ref 14.0–49.0)
MCH: 30.4 pg (ref 27.2–33.4)
MCHC: 33.4 g/dL (ref 32.0–36.0)
MCV: 90.9 fL (ref 79.3–98.0)
MONO#: 1 10e3/uL — ABNORMAL HIGH (ref 0.1–0.9)
MONO%: 13.9 % (ref 0.0–14.0)
NEUT#: 4.4 10e3/uL (ref 1.5–6.5)
NEUT%: 61.9 % (ref 39.0–75.0)
Platelets: 323 10e3/uL (ref 140–400)
RBC: 4.71 10e6/uL (ref 4.20–5.82)
RDW: 15 % — ABNORMAL HIGH (ref 11.0–14.6)
WBC: 7.1 10e3/uL (ref 4.0–10.3)
lymph#: 1.2 10e3/uL (ref 0.9–3.3)

## 2014-06-21 LAB — COMPREHENSIVE METABOLIC PANEL (CC13)
ALT: 17 U/L (ref 0–55)
AST: 15 U/L (ref 5–34)
Albumin: 3.9 g/dL (ref 3.5–5.0)
Alkaline Phosphatase: 62 U/L (ref 40–150)
Anion Gap: 10 meq/L (ref 3–11)
BUN: 16.9 mg/dL (ref 7.0–26.0)
CO2: 26 meq/L (ref 22–29)
Calcium: 9.3 mg/dL (ref 8.4–10.4)
Chloride: 105 meq/L (ref 98–109)
Creatinine: 0.7 mg/dL (ref 0.7–1.3)
EGFR: >90 ml/min/1.73 m2
Glucose: 116 mg/dL (ref 70–140)
Potassium: 4.6 meq/L (ref 3.5–5.1)
Sodium: 141 meq/L (ref 136–145)
Total Bilirubin: 0.39 mg/dL (ref 0.20–1.20)
Total Protein: 6.9 g/dL (ref 6.4–8.3)

## 2014-06-21 LAB — POCT INR: INR: 1.8

## 2014-06-21 LAB — LACTATE DEHYDROGENASE (CC13): LDH: 117 U/L — ABNORMAL LOW (ref 125–245)

## 2014-06-23 LAB — BETA 2 MICROGLOBULIN, SERUM: Beta-2 Microglobulin: 1.8 mg/L (ref <=2.51)

## 2014-06-29 ENCOUNTER — Other Ambulatory Visit: Payer: Self-pay | Admitting: Emergency Medicine

## 2014-06-29 DIAGNOSIS — N529 Male erectile dysfunction, unspecified: Secondary | ICD-10-CM

## 2014-06-29 MED ORDER — CITALOPRAM HYDROBROMIDE 20 MG PO TABS: ORAL_TABLET | ORAL | Status: DC

## 2014-06-29 MED ORDER — WARFARIN SODIUM 5 MG PO TABS: 5.0000 mg | ORAL_TABLET | ORAL | Status: DC

## 2014-06-29 MED ORDER — SILDENAFIL CITRATE 100 MG PO TABS: 100.0000 mg | ORAL_TABLET | Freq: Every day | ORAL | Status: DC | PRN

## 2014-07-11 DIAGNOSIS — H25811 Combined forms of age-related cataract, right eye: Secondary | ICD-10-CM | POA: Diagnosis not present

## 2014-07-11 DIAGNOSIS — H2511 Age-related nuclear cataract, right eye: Secondary | ICD-10-CM | POA: Diagnosis not present

## 2014-07-11 DIAGNOSIS — H25011 Cortical age-related cataract, right eye: Secondary | ICD-10-CM | POA: Diagnosis not present

## 2014-07-11 DIAGNOSIS — H25041 Posterior subcapsular polar age-related cataract, right eye: Secondary | ICD-10-CM | POA: Diagnosis not present

## 2014-07-14 ENCOUNTER — Other Ambulatory Visit: Payer: Self-pay | Admitting: Gastroenterology

## 2014-07-14 DIAGNOSIS — Z86010 Personal history of colonic polyps: Secondary | ICD-10-CM | POA: Diagnosis not present

## 2014-07-14 DIAGNOSIS — K621 Rectal polyp: Secondary | ICD-10-CM | POA: Diagnosis not present

## 2014-07-14 DIAGNOSIS — K644 Residual hemorrhoidal skin tags: Secondary | ICD-10-CM | POA: Diagnosis not present

## 2014-07-14 DIAGNOSIS — K5730 Diverticulosis of large intestine without perforation or abscess without bleeding: Secondary | ICD-10-CM | POA: Diagnosis not present

## 2014-07-14 DIAGNOSIS — K641 Second degree hemorrhoids: Secondary | ICD-10-CM | POA: Diagnosis not present

## 2014-07-14 DIAGNOSIS — Z09 Encounter for follow-up examination after completed treatment for conditions other than malignant neoplasm: Secondary | ICD-10-CM | POA: Diagnosis not present

## 2014-07-24 ENCOUNTER — Ambulatory Visit (INDEPENDENT_AMBULATORY_CARE_PROVIDER_SITE_OTHER): Payer: Medicare Other | Admitting: Pharmacist

## 2014-07-24 DIAGNOSIS — Z86718 Personal history of other venous thrombosis and embolism: Secondary | ICD-10-CM

## 2014-07-24 DIAGNOSIS — Z792 Long term (current) use of antibiotics: Secondary | ICD-10-CM | POA: Diagnosis not present

## 2014-07-24 DIAGNOSIS — Z5181 Encounter for therapeutic drug level monitoring: Secondary | ICD-10-CM

## 2014-07-24 LAB — POCT INR: INR: 1.3

## 2014-08-07 ENCOUNTER — Ambulatory Visit (INDEPENDENT_AMBULATORY_CARE_PROVIDER_SITE_OTHER): Payer: Medicare Other

## 2014-08-07 DIAGNOSIS — Z86718 Personal history of other venous thrombosis and embolism: Secondary | ICD-10-CM | POA: Diagnosis not present

## 2014-08-07 DIAGNOSIS — Z5181 Encounter for therapeutic drug level monitoring: Secondary | ICD-10-CM | POA: Diagnosis not present

## 2014-08-07 DIAGNOSIS — Z792 Long term (current) use of antibiotics: Secondary | ICD-10-CM

## 2014-08-07 LAB — POCT INR: INR: 2.1

## 2014-08-29 ENCOUNTER — Ambulatory Visit (INDEPENDENT_AMBULATORY_CARE_PROVIDER_SITE_OTHER): Payer: Medicare Other

## 2014-08-29 ENCOUNTER — Other Ambulatory Visit: Payer: Self-pay | Admitting: Emergency Medicine

## 2014-08-29 DIAGNOSIS — Z86718 Personal history of other venous thrombosis and embolism: Secondary | ICD-10-CM | POA: Diagnosis not present

## 2014-08-29 DIAGNOSIS — Z792 Long term (current) use of antibiotics: Secondary | ICD-10-CM | POA: Diagnosis not present

## 2014-08-29 DIAGNOSIS — Z5181 Encounter for therapeutic drug level monitoring: Secondary | ICD-10-CM

## 2014-08-29 LAB — POCT INR: INR: 2.2

## 2014-08-29 MED ORDER — DOXYCYCLINE HYCLATE 100 MG PO TABS: 100.0000 mg | ORAL_TABLET | Freq: Two times a day (BID) | ORAL | Status: DC

## 2014-08-31 ENCOUNTER — Ambulatory Visit (INDEPENDENT_AMBULATORY_CARE_PROVIDER_SITE_OTHER): Payer: Medicare Other | Admitting: Emergency Medicine

## 2014-08-31 ENCOUNTER — Encounter: Payer: Self-pay | Admitting: Emergency Medicine

## 2014-08-31 VITALS — BP 136/74 | HR 60 | Temp 97.7°F | Resp 16 | Ht 70.5 in | Wt 335.4 lb

## 2014-08-31 DIAGNOSIS — L03115 Cellulitis of right lower limb: Secondary | ICD-10-CM | POA: Diagnosis not present

## 2014-08-31 MED ORDER — DOXYCYCLINE HYCLATE 100 MG PO TABS: 100.0000 mg | ORAL_TABLET | Freq: Two times a day (BID) | ORAL | Status: DC

## 2014-08-31 NOTE — Progress Notes (Signed)
Subjective:  This chart was scribed for [name], MD by Tamsen Roers, at [facility].  This patient was seen in room 23 and the patient's care was started at 11:16 AM.    Patient ID: Marc Mills, male    DOB: Aug 21, 1945, 69 y.o.   MRN: [id number] Chief Complaint  Patient presents with  . Follow-up  . Cellulitis    right leg     HPI  HPI Comments: Marc Mills is a 69 y.o. male who presents to the [facility] for recurrent cellulitis.  He generally but not always, responds to antibiotics. His cellulitis on his right shin that started  four days ago. He denies any fevers/chills and started taking doxycycline from his last cellulitis episode.  He states that he recently fell at the gym and bruised his right leg.  He did a 4 mile walk and swam a mile at the gym which he states is going well for him.  He has no other complaints today.    Patient Active Problem List   Diagnosis Date Noted  . Postoperative wound infection 12/09/2013  . Postop check 11/24/2013  . Ulcer of lower limb, unspecified 08/24/2013  . Inguinal lymphadenopathy 08/16/2013  . Encounter for therapeutic drug monitoring 06/09/2013  . Cellulitis of left lower extremity 04/04/2013  . Osteoarthritis of left knee 11/02/2012  . COPD, mild 10/18/2012  . Cellulitis of right lower extremity 04/30/2012  . History of DVT of lower extremity 04/29/2012  . Status post splenectomy 04/29/2012  . Incisional hernia 05/27/2011  . Splenic marginal zone b-cell lymphoma 04/16/2010  . Morbid obesity 04/19/2008  . OBSTRUCTIVE SLEEP APNEA 04/19/2008  . VARICOSE VEINS, LOWER EXTREMITIES 04/19/2008  . PULMONARY EMBOLISM, HX OF 04/19/2008   Past Medical History  Diagnosis Date  . Morbid obesity   . History of pulmonary embolus (PE)   . History of lymphoma   . Incisional hernia   . Arthritis   . Blood transfusion without reported diagnosis   . DVT (deep venous thrombosis)      2011 right leg  . Anorectal fistula   . OSA on CPAP     cpap  10 yrs  . Varicose veins of lower extremities     pe  . Cancer     lymphoma hx  . Cataract   . Cellulitis 04/04/2013   Past Surgical History  Procedure Laterality Date  . Splenectomy    . Prostate surgery      (patient denies)  . Ivc filter    . Tonsillectomy      age 74  . Eye surgery Left 03    cat, detached ret    . Hernia repair    . Total knee arthroplasty Left 11/02/2012    Procedure: TOTAL KNEE ARTHROPLASTY with revision tibia;  Surgeon: Garald Balding, MD;  Location: [place];  Service: Orthopedics;  Laterality: Left;  . Fracture surgery    . Inguinal lymph node biopsy [name] 11/15/2013    Procedure: RIGHT INGUINAL LYMPH NODE BIOPSY;  Surgeon: Gwenyth Ober, MD;  Location: [place];  Service: General;  Laterality: [name];   Allergies  Allergen Reactions  . Hydrocodone Itching and Other (See Comments)    Gives me chills  . Oxycodone Itching and Other (See Comments)    Gives me chills   Prior to Admission medications   Medication Sig Start Date End Date Taking? Authorizing Provider  acetaminophen (TYLENOL) 500 MG  tablet Take 1,000 mg by mouth every 6 (six) hours as needed for mild pain.   Yes Historical Provider, MD  b complex vitamins tablet Take 1 tablet by mouth at bedtime.    Yes Historical Provider, MD  calcium carbonate (OS-CAL) 600 MG TABS Take 600 mg by mouth at bedtime.    Yes Historical Provider, MD  cholecalciferol (VITAMIN D) 1000 UNITS tablet Take 1,000 Units by mouth at bedtime.    Yes Historical Provider, MD  Cinnamon 500 MG capsule Take 500 mg by mouth daily.   Yes Historical Provider, MD  citalopram (CELEXA) 20 MG tablet Take 1 tablet (20 mg total) by mouth daily. 05/02/14  Yes [name], MD  citalopram (CELEXA) 20 MG tablet TAKE 1 TABLET (20 MG TOTAL) BY MOUTH DAILY. 06/29/14  Yes [name], MD  doxycycline (VIBRA-TABS) 100 MG tablet Take 1 tablet (100 mg total) by mouth 2 (two) times  daily. 08/29/14  Yes [name], MD  Magnesium 400 MG CAPS Take by mouth.   Yes Historical Provider, MD  Misc Natural Products (GLUCOS-CHONDROIT-MSM COMPLEX) TABS Take 1 tablet by mouth daily.   Yes Historical Provider, MD  Multiple Vitamin (MULTIVITAMIN) tablet Take 1 tablet by mouth at bedtime.    Yes Historical Provider, MD  sildenafil (VIAGRA) 100 MG tablet Take 1 tablet (100 mg total) by mouth daily as needed for erectile dysfunction. Splits the tablet in half prn 06/29/14  Yes [name], MD  vitamin E 400 UNIT capsule Take 400 Units by mouth daily.   Yes Historical Provider, MD  warfarin (COUMADIN) 5 MG tablet Take 1 tablet (5 mg total) by mouth 1 day or 1 dose. As directed 06/29/14  Yes [name], MD   History   Social History  . Marital Status: Married    Spouse Name: [name]  . Number of Children: [name]  . Years of Education: [name]   Occupational History  . Not on file.   Social History Main Topics  . Smoking status: Former Smoker -- 2.00 packs/day for 20 years    Types: Cigarettes    Quit date: 05/05/1984  . Smokeless tobacco: Never Used  . Alcohol Use: No  . Drug Use: No  . Sexual Activity: Yes    Birth Control/ Protection: None   Other Topics Concern  . Not on file   Social History Narrative      Review of Systems  Constitutional: Negative for fever and chills.  Gastrointestinal: Negative for nausea and vomiting.  Skin: Positive for rash.       Objective:   Physical Exam  CONSTITUTIONAL: Well developed/well nourished HEAD: Normocephalic/atraumatic EYES: EOMI/PERRL ENMT: Mucous membranes moist NECK: supple no meningeal signs SPINE/BACK:entire spine nontender CV: S1/S2 noted, no murmurs/rubs/gallops noted LUNGS: Lungs are clear to auscultation bilaterally, no apparent distress ABDOMEN: soft, nontender, no rebound or guarding, bowel sounds noted throughout abdomen GU:no cva tenderness NEURO: Pt is awake/alert/appropriate, moves all extremitiesx4.  No  facial droop.   EXTREMITIES: pulses normal/equal, full ROM SKIN: He has an area of redness with minimal tenderness over the right shin.   PSYCH: no abnormalities of mood noted, alert and oriented to situation       Assessment & Plan:   Patient has a resolving cellulitis on his right shin. He will continue his antibiotics and these were refilled. He should treat himself for at least 3 weeks. He will let me know if the cellulitis does not resolve. He has contacted the  Coumadin clinic regarding his dosage.I personally performed the services described in this documentation, which was scribed in my presence. The recorded information has been reviewed and is accurate.

## 2014-09-01 ENCOUNTER — Telehealth: Payer: Self-pay | Admitting: Pharmacist

## 2014-09-01 NOTE — Telephone Encounter (Signed)
Returned call to pt, LMOM TCB.  Pt will be advised Doxycycline can interact with Coumadin and cause INR to elevate.  Pt will need sooner appt after starting Doxycycline to check INR and adjust Coumadin dosage as needed while on abx.

## 2014-09-01 NOTE — Telephone Encounter (Signed)
New message     Pt was presc doxycycline and he is on coumadin

## 2014-09-06 ENCOUNTER — Ambulatory Visit (INDEPENDENT_AMBULATORY_CARE_PROVIDER_SITE_OTHER): Payer: Medicare Other | Admitting: *Deleted

## 2014-09-06 DIAGNOSIS — Z792 Long term (current) use of antibiotics: Secondary | ICD-10-CM | POA: Diagnosis not present

## 2014-09-06 DIAGNOSIS — Z86718 Personal history of other venous thrombosis and embolism: Secondary | ICD-10-CM | POA: Diagnosis not present

## 2014-09-06 DIAGNOSIS — Z5181 Encounter for therapeutic drug level monitoring: Secondary | ICD-10-CM | POA: Diagnosis not present

## 2014-09-06 LAB — POCT INR: INR: 2.2

## 2014-09-06 NOTE — Telephone Encounter (Signed)
Telephoned pt and he answers his mobile #, he states he has been on Doxy since 09/01/14 he states he has another 5 days. Thus schedule a Coumadin appt for today for INR check.

## 2014-09-13 ENCOUNTER — Other Ambulatory Visit (HOSPITAL_BASED_OUTPATIENT_CLINIC_OR_DEPARTMENT_OTHER): Payer: Medicare Other

## 2014-09-13 DIAGNOSIS — L03314 Cellulitis of groin: Secondary | ICD-10-CM

## 2014-09-13 DIAGNOSIS — L03115 Cellulitis of right lower limb: Secondary | ICD-10-CM

## 2014-09-13 DIAGNOSIS — L03116 Cellulitis of left lower limb: Secondary | ICD-10-CM

## 2014-09-13 DIAGNOSIS — Z8572 Personal history of non-Hodgkin lymphomas: Secondary | ICD-10-CM | POA: Diagnosis present

## 2014-09-13 DIAGNOSIS — J449 Chronic obstructive pulmonary disease, unspecified: Secondary | ICD-10-CM | POA: Diagnosis not present

## 2014-09-13 DIAGNOSIS — L03319 Cellulitis of trunk, unspecified: Secondary | ICD-10-CM | POA: Diagnosis not present

## 2014-09-13 DIAGNOSIS — L03119 Cellulitis of unspecified part of limb: Secondary | ICD-10-CM | POA: Diagnosis not present

## 2014-09-13 DIAGNOSIS — Z8579 Personal history of other malignant neoplasms of lymphoid, hematopoietic and related tissues: Secondary | ICD-10-CM | POA: Diagnosis not present

## 2014-09-13 DIAGNOSIS — Z87898 Personal history of other specified conditions: Secondary | ICD-10-CM

## 2014-09-13 LAB — CBC WITH DIFFERENTIAL/PLATELET
BASO%: 0.8 % (ref 0.0–2.0)
Basophils Absolute: 0.1 10*3/uL (ref 0.0–0.1)
EOS%: 6.3 % (ref 0.0–7.0)
Eosinophils Absolute: 0.5 10*3/uL (ref 0.0–0.5)
HCT: 40.8 % (ref 38.4–49.9)
HGB: 13.6 g/dL (ref 13.0–17.1)
LYMPH%: 16.8 % (ref 14.0–49.0)
MCH: 30.4 pg (ref 27.2–33.4)
MCHC: 33.3 g/dL (ref 32.0–36.0)
MCV: 91.1 fL (ref 79.3–98.0)
MONO#: 1.2 10*3/uL — ABNORMAL HIGH (ref 0.1–0.9)
MONO%: 16.9 % — ABNORMAL HIGH (ref 0.0–14.0)
NEUT#: 4.2 10*3/uL (ref 1.5–6.5)
NEUT%: 59.2 % (ref 39.0–75.0)
Platelets: 313 10*3/uL (ref 140–400)
RBC: 4.48 10*6/uL (ref 4.20–5.82)
RDW: 14.6 % (ref 11.0–14.6)
WBC: 7.1 10*3/uL (ref 4.0–10.3)
lymph#: 1.2 10*3/uL (ref 0.9–3.3)

## 2014-09-13 LAB — COMPREHENSIVE METABOLIC PANEL (CC13)
ALT: 19 U/L (ref 0–55)
AST: 19 U/L (ref 5–34)
Albumin: 3.7 g/dL (ref 3.5–5.0)
Alkaline Phosphatase: 68 U/L (ref 40–150)
Anion Gap: 8 mEq/L (ref 3–11)
BUN: 15.5 mg/dL (ref 7.0–26.0)
CO2: 27 mEq/L (ref 22–29)
Calcium: 9 mg/dL (ref 8.4–10.4)
Chloride: 105 mEq/L (ref 98–109)
Creatinine: 0.7 mg/dL (ref 0.7–1.3)
EGFR: >90 mL/min/{1.73_m2} (ref >90)
Glucose: 96 mg/dl (ref 70–140)
Potassium: 4.6 mEq/L (ref 3.5–5.1)
Sodium: 141 mEq/L (ref 136–145)
Total Bilirubin: 0.36 mg/dL (ref 0.20–1.20)
Total Protein: 6.6 g/dL (ref 6.4–8.3)

## 2014-09-13 LAB — LACTATE DEHYDROGENASE (CC13): LDH: 114 U/L — ABNORMAL LOW (ref 125–245)

## 2014-09-15 LAB — BETA 2 MICROGLOBULIN, SERUM: Beta-2 Microglobulin: 1.83 mg/L (ref <=2.51)

## 2014-09-19 ENCOUNTER — Other Ambulatory Visit: Payer: Self-pay | Admitting: Oncology

## 2014-09-20 ENCOUNTER — Telehealth: Payer: Self-pay | Admitting: Oncology

## 2014-09-20 ENCOUNTER — Ambulatory Visit (HOSPITAL_BASED_OUTPATIENT_CLINIC_OR_DEPARTMENT_OTHER): Payer: Medicare Other | Admitting: Oncology

## 2014-09-20 VITALS — BP 130/59 | HR 69 | Temp 97.3°F | Resp 18 | Ht 70.5 in | Wt 338.0 lb

## 2014-09-20 DIAGNOSIS — Z9081 Acquired absence of spleen: Secondary | ICD-10-CM | POA: Diagnosis not present

## 2014-09-20 DIAGNOSIS — Z8572 Personal history of non-Hodgkin lymphomas: Secondary | ICD-10-CM

## 2014-09-20 DIAGNOSIS — C8307 Small cell B-cell lymphoma, spleen: Secondary | ICD-10-CM

## 2014-09-20 DIAGNOSIS — L03115 Cellulitis of right lower limb: Secondary | ICD-10-CM

## 2014-09-20 DIAGNOSIS — L03116 Cellulitis of left lower limb: Secondary | ICD-10-CM

## 2014-09-20 MED ORDER — MENINGOCOCCAL VAC A,C,Y,W-135 ~~LOC~~ INJ: 0.5000 mL | INJECTION | Freq: Once | SUBCUTANEOUS | Status: DC

## 2014-09-20 MED ORDER — HAEMOPHILUS B POLYSAC CONJ VAC IM SOLR: 0.5000 mL | Freq: Once | INTRAMUSCULAR | Status: DC

## 2014-09-20 MED ORDER — PNEUMOCOCCAL VAC POLYVALENT 25 MCG/0.5ML IJ INJ: 0.5000 mL | INJECTION | INTRAMUSCULAR | Status: DC
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
## 2014-09-20 NOTE — Telephone Encounter (Signed)
Appointments made and avs printed for patient °

## 2014-10-04 ENCOUNTER — Ambulatory Visit (INDEPENDENT_AMBULATORY_CARE_PROVIDER_SITE_OTHER): Payer: Medicare Other | Admitting: *Deleted

## 2014-10-04 DIAGNOSIS — Z5181 Encounter for therapeutic drug level monitoring: Secondary | ICD-10-CM | POA: Diagnosis not present

## 2014-10-04 DIAGNOSIS — Z86718 Personal history of other venous thrombosis and embolism: Secondary | ICD-10-CM | POA: Diagnosis not present

## 2014-10-04 DIAGNOSIS — Z792 Long term (current) use of antibiotics: Secondary | ICD-10-CM

## 2014-10-04 LAB — POCT INR: INR: 2.2

## 2014-10-20 ENCOUNTER — Ambulatory Visit (INDEPENDENT_AMBULATORY_CARE_PROVIDER_SITE_OTHER): Payer: Medicare Other | Admitting: Internal Medicine

## 2014-10-20 ENCOUNTER — Encounter: Payer: Self-pay | Admitting: Internal Medicine

## 2014-10-20 VITALS — BP 120/66 | HR 58 | Ht 72.0 in | Wt 334.6 lb

## 2014-10-20 DIAGNOSIS — G4733 Obstructive sleep apnea (adult) (pediatric): Secondary | ICD-10-CM | POA: Diagnosis not present

## 2014-10-20 DIAGNOSIS — Z86718 Personal history of other venous thrombosis and embolism: Secondary | ICD-10-CM

## 2014-10-20 NOTE — Patient Instructions (Signed)
Order- DME [facility]- replacement CPAP machine (suggest Dream machine), auto 5-15, mask of choice, humidifier, supplies,   Dx OSA Download for pressure compliance when available
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
## 2014-10-21 NOTE — Assessment & Plan Note (Signed)
He reports regular swimming and walking but apparently eats enough to maintain a stable weight and he describes teaching a cooking class.

## 2014-10-21 NOTE — Assessment & Plan Note (Signed)
He continues lifelong anticoagulation/Coumadin after DVT-PE 2 with leg trauma and morbid obesity representing significant long-term risks for recurrence.

## 2014-10-21 NOTE — Assessment & Plan Note (Signed)
Ongoing medical necessity of CPAP confirmed by updated sleep study. Plan-replace old worn out CPAP machine/[facility]. Start with auto titration 5-15 and check download.

## 2014-10-30 ENCOUNTER — Other Ambulatory Visit: Payer: Self-pay

## 2014-11-01 ENCOUNTER — Ambulatory Visit (INDEPENDENT_AMBULATORY_CARE_PROVIDER_SITE_OTHER): Payer: Medicare Other | Admitting: *Deleted

## 2014-11-01 DIAGNOSIS — Z5181 Encounter for therapeutic drug level monitoring: Secondary | ICD-10-CM

## 2014-11-01 DIAGNOSIS — Z792 Long term (current) use of antibiotics: Secondary | ICD-10-CM | POA: Diagnosis not present

## 2014-11-01 DIAGNOSIS — Z86718 Personal history of other venous thrombosis and embolism: Secondary | ICD-10-CM | POA: Diagnosis not present

## 2014-11-01 LAB — POCT INR: INR: 1.8

## 2014-11-15 ENCOUNTER — Telehealth: Payer: Self-pay | Admitting: Oncology

## 2014-11-15 ENCOUNTER — Other Ambulatory Visit: Payer: Self-pay | Admitting: Oncology

## 2014-11-15 NOTE — Telephone Encounter (Signed)
lvm for pt regarding to July appt. °

## 2014-11-16 ENCOUNTER — Other Ambulatory Visit: Payer: Self-pay | Admitting: Oncology

## 2014-11-22 ENCOUNTER — Ambulatory Visit (INDEPENDENT_AMBULATORY_CARE_PROVIDER_SITE_OTHER): Payer: Medicare Other | Admitting: *Deleted

## 2014-11-22 DIAGNOSIS — Z5181 Encounter for therapeutic drug level monitoring: Secondary | ICD-10-CM

## 2014-11-22 DIAGNOSIS — Z792 Long term (current) use of antibiotics: Secondary | ICD-10-CM

## 2014-11-22 DIAGNOSIS — Z86718 Personal history of other venous thrombosis and embolism: Secondary | ICD-10-CM

## 2014-11-22 LAB — POCT INR: INR: 1.6

## 2014-11-28 ENCOUNTER — Ambulatory Visit (HOSPITAL_BASED_OUTPATIENT_CLINIC_OR_DEPARTMENT_OTHER): Payer: Medicare Other | Admitting: Genetic Counselor

## 2014-11-28 ENCOUNTER — Encounter: Payer: Self-pay | Admitting: Genetic Counselor

## 2014-11-28 ENCOUNTER — Other Ambulatory Visit (HOSPITAL_BASED_OUTPATIENT_CLINIC_OR_DEPARTMENT_OTHER): Payer: Medicare Other

## 2014-11-28 DIAGNOSIS — L03116 Cellulitis of left lower limb: Secondary | ICD-10-CM

## 2014-11-28 DIAGNOSIS — L03314 Cellulitis of groin: Secondary | ICD-10-CM

## 2014-11-28 DIAGNOSIS — Z803 Family history of malignant neoplasm of breast: Secondary | ICD-10-CM | POA: Diagnosis not present

## 2014-11-28 DIAGNOSIS — Z8572 Personal history of non-Hodgkin lymphomas: Secondary | ICD-10-CM | POA: Diagnosis not present

## 2014-11-28 DIAGNOSIS — Z800 Family history of malignant neoplasm of digestive organs: Secondary | ICD-10-CM | POA: Diagnosis not present

## 2014-11-28 DIAGNOSIS — L03115 Cellulitis of right lower limb: Secondary | ICD-10-CM

## 2014-11-28 DIAGNOSIS — Z809 Family history of malignant neoplasm, unspecified: Secondary | ICD-10-CM

## 2014-11-28 DIAGNOSIS — K635 Polyp of colon: Secondary | ICD-10-CM | POA: Diagnosis not present

## 2014-11-28 DIAGNOSIS — Z87898 Personal history of other specified conditions: Secondary | ICD-10-CM

## 2014-11-28 DIAGNOSIS — Z801 Family history of malignant neoplasm of trachea, bronchus and lung: Secondary | ICD-10-CM

## 2014-11-28 DIAGNOSIS — J449 Chronic obstructive pulmonary disease, unspecified: Secondary | ICD-10-CM

## 2014-11-28 DIAGNOSIS — Z315 Encounter for genetic counseling: Secondary | ICD-10-CM

## 2014-11-28 LAB — COMPREHENSIVE METABOLIC PANEL (CC13)
ALT: 20 U/L (ref 0–55)
AST: 19 U/L (ref 5–34)
Albumin: 3.9 g/dL (ref 3.5–5.0)
Alkaline Phosphatase: 66 U/L (ref 40–150)
Anion Gap: 7 mEq/L (ref 3–11)
BUN: 16.5 mg/dL (ref 7.0–26.0)
CO2: 26 mEq/L (ref 22–29)
Calcium: 9.3 mg/dL (ref 8.4–10.4)
Chloride: 107 mEq/L (ref 98–109)
Creatinine: 0.7 mg/dL (ref 0.7–1.3)
EGFR: >90 mL/min/{1.73_m2} (ref >90)
Glucose: 91 mg/dl (ref 70–140)
Potassium: 4.5 mEq/L (ref 3.5–5.1)
Sodium: 141 mEq/L (ref 136–145)
Total Bilirubin: 0.32 mg/dL (ref 0.20–1.20)
Total Protein: 6.8 g/dL (ref 6.4–8.3)

## 2014-11-28 LAB — CBC WITH DIFFERENTIAL/PLATELET
BASO%: 0.7 % (ref 0.0–2.0)
Basophils Absolute: 0 10*3/uL (ref 0.0–0.1)
EOS%: 7.1 % — ABNORMAL HIGH (ref 0.0–7.0)
Eosinophils Absolute: 0.4 10*3/uL (ref 0.0–0.5)
HCT: 42.7 % (ref 38.4–49.9)
HGB: 14.2 g/dL (ref 13.0–17.1)
LYMPH%: 14.5 % (ref 14.0–49.0)
MCH: 30.3 pg (ref 27.2–33.4)
MCHC: 33.3 g/dL (ref 32.0–36.0)
MCV: 91 fL (ref 79.3–98.0)
MONO#: 0.8 10*3/uL (ref 0.1–0.9)
MONO%: 14.5 % — ABNORMAL HIGH (ref 0.0–14.0)
NEUT#: 3.7 10*3/uL (ref 1.5–6.5)
NEUT%: 63.2 % (ref 39.0–75.0)
Platelets: 268 10*3/uL (ref 140–400)
RBC: 4.69 10*6/uL (ref 4.20–5.82)
RDW: 13.8 % (ref 11.0–14.6)
WBC: 5.8 10*3/uL (ref 4.0–10.3)
lymph#: 0.8 10*3/uL — ABNORMAL LOW (ref 0.9–3.3)

## 2014-11-28 LAB — LACTATE DEHYDROGENASE (CC13): LDH: 117 U/L — ABNORMAL LOW (ref 125–245)

## 2014-11-28 NOTE — Progress Notes (Signed)
REFERRING PROVIDER: Lurline Del, MD  PRIMARY PROVIDER:  Jenny Reichmann, MD  PRIMARY REASON FOR VISIT:  1. History of B-cell lymphoma   2. Family history of pancreatic cancer   3. Family history of breast cancer in male   5. Family history of rectal cancer   5. Family history of cancer      HISTORY OF PRESENT ILLNESS:   Mr. Corsino, a 69 y.o. male, was seen for a [facility] cancer genetics consultation at the request of Dr. Jana Hakim due to a personal history of cancer and maternal family history of pancreatic, breast, and other cancers.  Mr. Eberwein presents to clinic today to discuss the possibility of a hereditary predisposition to cancer, genetic testing, and to further clarify his future cancer risks, as well as potential cancer risks for family members.   In 2011, at the age of 59, Mr. 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 This was treated with splenectomy in August 2011.     CANCER HISTORY:   No history exists.  2011 - non-Hodgkin's/B-cell lymphoma confined to spleen; s/p with splenectomy   RISK FACTORS:  Prostate screening: Annual PSA; all normal Colonoscopy: yes; most recent found one hyperplastic polyp in March 2016; colonoscopy in 2010 found 1 tubular adenoma fragment and 1 hyperplastic polyp. Any excessive radiation exposure in the past:  no  Past Medical History  Diagnosis Date  . Morbid obesity   . History of pulmonary embolus (PE)   . History of lymphoma   . Incisional hernia   . Arthritis   . Blood transfusion without reported diagnosis   . DVT (deep venous thrombosis)     2011 right leg  . Anorectal fistula   . OSA on CPAP     cpap  10 yrs  . Varicose veins of lower extremities     pe  . Cancer     lymphoma hx  . Cataract   . Cellulitis 04/04/2013    Past Surgical History  Procedure Laterality Date  . Splenectomy    . Prostate surgery      (patient denies)  . Ivc filter    . Tonsillectomy       age 41  . Eye surgery Left 03    cat, detached ret    . Hernia repair    . Total knee arthroplasty Left 11/02/2012    Procedure: TOTAL KNEE ARTHROPLASTY with revision tibia;  Surgeon: Garald Balding, MD;  Location: [facility];  Service: Orthopedics;  Laterality: Left;  . Fracture surgery    . Inguinal lymph node biopsy [name] 11/15/2013    Procedure: RIGHT INGUINAL LYMPH NODE BIOPSY;  Surgeon: Gwenyth Ober, MD;  Location: [facility];  Service: General;  Laterality: [name];    History   Social History  . Marital Status: Married    Spouse Name: [name]  . Number of Children: [name]  . Years of Education: [name]   Social History Main Topics  . Smoking status: Former Smoker -- 2.00 packs/day for 20 years    Types: Cigarettes    Quit date: 05/05/1984  . Smokeless tobacco: Never Used  . Alcohol Use: No  . Drug Use: Yes     Comment: maybe 1 beer/month  . Sexual Activity: Yes    Birth Control/ Protection: None   Other Topics Concern  . None   Social History Narrative     FAMILY HISTORY:  We obtained a detailed, 4-generation family history.  Significant diagnoses are  listed below: Family History  Problem Relation Age of Onset  . Heart disease Father   . Varicose Veins Father   . Heart attack Father     dx. 66s  . Heart disease Mother 69  . Varicose Veins Mother   . Pancreatic cancer Sister 11  . Cancer Maternal Grandmother     either stomach or pancreatic primary   . Pancreatic cancer Maternal Grandfather     dx. 10s  . Lung cancer Maternal Aunt     secondhand smoke exposure  . Heart attack Paternal Uncle   . Rectal cancer Maternal Aunt     dx. 84s  . Cancer Maternal Aunt     unknown type  . Diabetes Paternal Uncle   . Cancer Cousin     unknown type  . Breast cancer Cousin     dx. 72s    Mr. Olden has one son, age 39, and one daughter, age 87.  He is expecting the arrival of a granddaughter in September.  Mr. Isenberg full sister, Charlett Nose, passed away from pancreatic cancer  recently at the age of 3.  She reportedly had negative BRCA1/2 genetic testing within the last year.  Her daughter, Mr. Tabb niece, was diagnosed with breast cancer at the age of 18 and passed away at 79.  Mr. Prashad mother and father died in their 24s and were cancer-free to his knowledge.    There is a maternal family history of cancer.  Mr. Sobotka mother had eight full sisters, three of whom had cancer.  One sister was diagnosed with lung cancer, another with rectal cancer in her 45s, and a third with an unknown type cancer in her late 40s-early 60s.  His maternal aunt with rectal cancer had three daughters and one son.  Two of these daughters were diagnosed with breast cancer--one of whom was diagnosed and died in her 52s.  One additional maternal first cousin was diagnosed with unknown cancer.  Mr. Muscatello maternal grandmother was diagnosed with either a primary stomach or pancreatic cancer and passed away in her 11s.  His maternal grandfather was diagnosed and passed away from pancreatic cancer in his 28s.  Mr. Stene has no additional information for any maternal great grandparents or great uncles/aunts.  Mr. Dewald father had one full sister who passed away in her late 62s.  He had full five brothers--two passed away in their 85s from heart/diabetes-related complications, three others died in their 52s-90s.  None had cancer.  Mr. Haliburton paternal grandmother died in her 64s and his grandfather died later in life.  He has no further information for his grandparents and he has no information for his paternal cousins.  Patient's maternal and paternal ancestors are of [place] and Ashkenazi Jewish descent. There is no known consanguinity.  GENETIC COUNSELING ASSESSMENT: QAIS JOWERS is a 69 y.o. male with a family history of breast, pancreatic, and other cancers which is somewhat suggestive of a hereditary cancer syndrome and predisposition to cancer. We,  therefore, discussed and recommended the following at today's visit.   DISCUSSION: We reviewed the characteristics, features and inheritance patterns of hereditary cancer syndromes, particularly those associated with changes within the BRCA1/2 and Lynch syndrome genes. We also discussed genetic testing, including the appropriate family members to test, the process of testing, insurance coverage and turn-around-time for results. We discussed the implications of a negative, positive and/or variant of uncertain significant result. We recommended Mr. Teichert pursue genetic testing for BRCA1/2 Ashkenazi Jewish 3-site  mutation panel with reflex to the 32-gene CancerNext Panel through [facility], [place]).   Based on Mr. Jansma family history of cancer, he meets medical criteria for genetic testing. Despite that he meets criteria, he may still have an out of pocket cost. We discussed that if his out of pocket cost for testing is over $100, the laboratory will call and confirm whether he wants to proceed with testing.  If the out of pocket cost of testing is less than $100 he will be billed by the genetic testing laboratory.   PLAN: After considering the risks, benefits, and limitations, Mr. Gawron  provided informed consent to pursue genetic testing and the blood sample was sent to [facility] for analysis of the BRCA1/2 Ashkenazi Jewish 3-site mutation panel with reflex to the 32-gene CancerNext Panel.  The BRCA1/2 Ashkenazi Jewish 3-site mutation panel includes testing for the three common Ashkenazi Jewish founder mutations, including "185delAG" and "5382insC" on the BRCA1 gene and "6174delT" on the BRCA2 gene.  The 32-gene CancerNext Panel includes sequencing and deletion/duplication analysis for the following 30 genes: APC, ATM, BARD1, BRCA1, BRCA2, BRIP1, BMPR1A, CDH1, CDK4, CDKN2A, CHEK2, MLH1, MRE11A, MSH2, MSH6, MUTYH, NBN, NF1, PALB2, PMS2, POLD1, POLE,  PTEN, RAD50, RAD51C, RAD51D, SMAD4, SMARCA4, STK11, and TP53.  This panel also includes deletion/duplication analysis (without sequencing) for two genes, EPCAM and SCG5/GREM1.  Results should be available within approximately 3-4 weeks' time, at which point they will be disclosed by telephone to Mr. Plotkin, as will any additional recommendations warranted by these results. Mr. Bardon will receive a summary of his genetic counseling visit and a copy of his results once available. This information will also be available in Epic. We encouraged Mr. Keo to remain in contact with cancer genetics annually so that we can continuously update the family history and inform him of any changes in cancer genetics and testing that may be of benefit for his family. Mr. Clayson questions were answered to his satisfaction today. Our contact information was provided should additional questions or concerns arise.  Thank you for the referral and allowing [place] to share in the care of your patient.   Jeanine Luz, MS Genetic Counselor [email or web address] Phone: [phone]  The patient was seen for a total of 60 minutes in face-to-face genetic counseling.  This patient was discussed with Drs. Magrinat, Lindi Adie and/or Burr Medico who agrees with the above.    _______________________________________________________________________ For Office Staff:  Number of people involved in session: 1 Was an Intern/ student involved with case: no

## 2014-11-30 LAB — BETA 2 MICROGLOBULIN, SERUM: Beta-2 Microglobulin: 1.63 mg/L (ref <=2.51)

## 2014-12-06 ENCOUNTER — Other Ambulatory Visit: Payer: Self-pay | Admitting: Oncology

## 2014-12-06 ENCOUNTER — Encounter: Payer: Self-pay | Admitting: Oncology

## 2014-12-06 ENCOUNTER — Ambulatory Visit (INDEPENDENT_AMBULATORY_CARE_PROVIDER_SITE_OTHER): Payer: Medicare Other | Admitting: Pharmacist

## 2014-12-06 DIAGNOSIS — Z5181 Encounter for therapeutic drug level monitoring: Secondary | ICD-10-CM | POA: Diagnosis not present

## 2014-12-06 DIAGNOSIS — Z86718 Personal history of other venous thrombosis and embolism: Secondary | ICD-10-CM

## 2014-12-06 DIAGNOSIS — Z792 Long term (current) use of antibiotics: Secondary | ICD-10-CM

## 2014-12-06 LAB — POCT INR: INR: 2.4

## 2014-12-12 ENCOUNTER — Other Ambulatory Visit: Payer: Self-pay | Admitting: Emergency Medicine

## 2014-12-12 ENCOUNTER — Telehealth: Payer: Self-pay | Admitting: Emergency Medicine

## 2014-12-12 DIAGNOSIS — L03119 Cellulitis of unspecified part of limb: Secondary | ICD-10-CM

## 2014-12-12 MED ORDER — DOXYCYCLINE HYCLATE 100 MG PO TABS: 100.0000 mg | ORAL_TABLET | Freq: Two times a day (BID) | ORAL | Status: DC

## 2014-12-12 NOTE — Telephone Encounter (Signed)
Prescription sent for doxycycline for cellulitis

## 2014-12-12 NOTE — Telephone Encounter (Signed)
I sent a prescription in for doxycycline for patient to have on his trip in case  he had a flare of his cellulitis

## 2014-12-13 DIAGNOSIS — M19071 Primary osteoarthritis, right ankle and foot: Secondary | ICD-10-CM | POA: Diagnosis not present

## 2014-12-18 ENCOUNTER — Telehealth: Payer: Self-pay | Admitting: Genetic Counselor

## 2014-12-18 NOTE — Telephone Encounter (Signed)
Left a message for Marc Mills that his test results are back, could he call me back at [phone].  Remember now that he is out of the country until August 24th.  I will expect a call from him when he gets back, following that date.

## 2014-12-29 ENCOUNTER — Ambulatory Visit (INDEPENDENT_AMBULATORY_CARE_PROVIDER_SITE_OTHER): Payer: Medicare Other | Admitting: *Deleted

## 2014-12-29 DIAGNOSIS — Z5181 Encounter for therapeutic drug level monitoring: Secondary | ICD-10-CM

## 2014-12-29 DIAGNOSIS — Z86718 Personal history of other venous thrombosis and embolism: Secondary | ICD-10-CM | POA: Diagnosis not present

## 2014-12-29 DIAGNOSIS — Z792 Long term (current) use of antibiotics: Secondary | ICD-10-CM

## 2014-12-29 LAB — POCT INR: INR: 2.6

## 2015-01-01 ENCOUNTER — Telehealth: Payer: Self-pay | Admitting: Genetic Counselor

## 2015-01-03 NOTE — Telephone Encounter (Signed)
Discussed with Mr. Marc Mills that his genetic test results were negative for any pathogenic mutations within any of 32 genes that would cause him to be at an increased risk for pancreatic, colorectal, or other types of cancer.  There was one variant of uncertain significance identified in the ATM gene.  We discussed that most of the time, when these genes are reclassified by the lab, they are reclassified at negative results.  We discussed that we could still look into whether or not his sister had genetic analysis of this same gene at the time she had testing.  If she did and does carry this variant as well, then this does not necessarily tell us that this is the cause for the histories of cancer.  It may be helpful to test other relatives, in this case.  Regardless, it is worth looking into, if we have access to those results.  A negative genetic testing result could mean a few things for Marc Mills.  Either there is no genetic cause for the personal and family histories of cancer and those cancers just occurred by chance; or perhaps there is still a genetic cause for the family history of cancer that Mr. Marc Mills did not inherit; or we just do not have enough knowledge at this point in time to identify a genetic cause for the cancers in Mr. Marc Mills family.  I will see if I have access to Mr. Marc Mills file so that I may review her genetic test result for this same VUS in Marc Mills and I will notify him when I learn more.

## 2015-01-04 ENCOUNTER — Telehealth: Payer: Self-pay | Admitting: Internal Medicine

## 2015-01-04 NOTE — Telephone Encounter (Signed)
Per patient's e-mail to scheduler.  "I have a new cpap machine and need Dr. Annamaria Boots to finalize the settings. This is a Medicare requirement."  [facility] has no openings until January. Please advise.

## 2015-01-04 NOTE — Telephone Encounter (Signed)
lmomtcb x1 

## 2015-01-05 NOTE — Telephone Encounter (Signed)
Patient scheduled to see Dr. Annamaria Boots on 01/09/15 for CPAP follow up. Patient notified. Nothing further needed.

## 2015-01-09 ENCOUNTER — Ambulatory Visit (INDEPENDENT_AMBULATORY_CARE_PROVIDER_SITE_OTHER): Payer: Medicare Other | Admitting: Internal Medicine

## 2015-01-09 ENCOUNTER — Encounter: Payer: Self-pay | Admitting: Internal Medicine

## 2015-01-09 VITALS — BP 118/68 | HR 60 | Ht 72.0 in | Wt 338.4 lb

## 2015-01-09 DIAGNOSIS — Z23 Encounter for immunization: Secondary | ICD-10-CM

## 2015-01-09 DIAGNOSIS — I2699 Other pulmonary embolism without acute cor pulmonale: Secondary | ICD-10-CM | POA: Diagnosis not present

## 2015-01-09 DIAGNOSIS — G4733 Obstructive sleep apnea (adult) (pediatric): Secondary | ICD-10-CM | POA: Diagnosis not present

## 2015-01-09 NOTE — Patient Instructions (Signed)
Order- [facility] D[facility]- change CPAP range to auto 5-12     Dx OSA  Please call as needed

## 2015-01-09 NOTE — Assessment & Plan Note (Signed)
We understand that his risk factors are chronic and probably permanent with high risk of repeat DVT/PE. Plan-continue chronic Coumadin. He is satisfied with this and does not want to change to a newer product.

## 2015-01-09 NOTE — Assessment & Plan Note (Signed)
Pressure gets uncomfortably high. We discussed options. Plan-change auto pressure range to 5-12
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
## 2015-01-10 ENCOUNTER — Ambulatory Visit: Payer: Self-pay | Admitting: Genetic Counselor

## 2015-01-10 ENCOUNTER — Telehealth: Payer: Self-pay | Admitting: Genetic Counselor

## 2015-01-10 DIAGNOSIS — Z1379 Encounter for other screening for genetic and chromosomal anomalies: Secondary | ICD-10-CM

## 2015-01-10 NOTE — Telephone Encounter (Signed)
Discussed with Mr. Marc Mills that I looked into his sister's previous genetic counseling visit and genetic test results to determine whether or not we could find out if she also carried the same variant of uncertain significance in the ATM gene as her brother.  At the time, she only had genetic testing for three genes (BRCA1/2 and PALB2) which was negative.  I called the lab through which she had testing, [facility], to see if we could get the information for her ATM gene to learn more about potential presence of this VUS.  They said we could probably get this information, but it would cost $475.  I relayed this to Marc Mills and also discussed that this information may still not be helpful to us in the long run.  He agrees that this probably won't be helpful information for him and does not want to proceed further.  We discussed that it might still be helpful to discuss genetic counseling and testing with his cousin who had breast cancer.  We also reviewed that this VUS will hopefully be updated by the lab in the future, so he should keep his contact information up to date with us.  He is aware that, still, most of the time these VUS results are reclassified as negative results (or simply benign changes that do not cause an increased risk for cancer).  He and his relatives are welcome to call me with any further questions. 

## 2015-01-11 ENCOUNTER — Encounter (HOSPITAL_COMMUNITY): Payer: Self-pay

## 2015-01-12 DIAGNOSIS — Z1379 Encounter for other screening for genetic and chromosomal anomalies: Secondary | ICD-10-CM | POA: Insufficient documentation

## 2015-01-12 NOTE — Progress Notes (Signed)
GENETIC TEST RESULTS  HPI: Marc Mills was previously seen in the [facility] due to a personal and family history of cancer and concerns regarding a hereditary predisposition to cancer. Please refer to our prior cancer genetics clinic note from November 28, 2014 for more information regarding Marc Mills medical, social and family histories, and our assessment and recommendations, at the time. Marc Mills recent genetic test results were disclosed to him, as were recommendations warranted by these results. These results and recommendations are discussed in more detail below.  GENETIC TEST RESULTS: At the time of Marc Mills visit on 11/28/14, we recommended he pursue genetic testing of the 32-gene CancerNext panel through [employer] [facility], [place]).  The CancerNext Panel through [employer] includes sequencing and deletion/duplication analysis for the following 30 genes: APC, ATM, BARD1, BRCA1, BRCA2, BRIP1, BMPR1A, CDH1, CDK4, CDKN2A, CHEK2, MLH1, MRE11A, MSH2, MSH6, MUTYH, NBN, NF1, PALB2, PMS2, POLD1, POLE, PTEN, RAD50, RAD51C, RAD51D, SMAD4, SMARCA4, STK11, and TP53.  This panel also includes deletion/duplication analysis (without sequencing) for two genes, EPCAM and GREM1/SCG5.  The result is now back, the report date of which is December 15, 2014.  Genetic testing was normal, and did not reveal a deleterious mutation in these genes.  One variant of uncertain significance (VUS) was found, called "c.1703G>T" or "p.R5681" in the ATM gene.  No additional VUSs were found.  The test report will be scanned into EPIC and will be located under the Results tab in the Molecular Pathology section.   Genetic testing did identify a variant of uncertain significance (VUS) called "c.1703G>T" or "p.R5681" in the ATM gene. At this time, it is unknown if this VUS is associated with an increased risk for cancer or if this is a normal finding. Since this VUS result is  uncertain, it cannot help guide screening recommendations, and family members should not be tested for this VUS to help define their own cancer risks.  Also, we all have variants within our genes that make [place] unique individuals--most of these variants are benign.  Thus, we treat this VUS as a negative result.   With time, we suspect the lab will reclassify this variant and when they do, we will try to re-contact Marc Mills to discuss the reclassification further.  We also encouraged Marc Mills to contact us in a year or two to obtain an update on the status of this VUS.  We discussed with Marc Mills that since the current genetic testing is not perfect, it is possible there may be a gene mutation in one of these genes that current testing cannot detect, but that chance is small. We also discussed, that it is possible that another gene that has not yet been discovered, or that we have not yet tested, is responsible for the cancer diagnoses in the family, and it is, therefore, important to remain in touch with cancer genetics in the future so that we can continue to offer Marc Mills the most up to date genetic testing.   CANCER SCREENING RECOMMENDATIONS: While we still do not have an explanation for the personal and family history of cancer, this result is reassuring and indicates that Marc Mills likely does not have an increased risk for a future cancer due to a mutation in one of these genes. This normal test also suggests that Marc Mills cancer was most likely not due to an inherited predisposition associated with one of these genes.  Most cancers happen by chance and  this negative test suggests that his cancer falls into this category.  We, therefore, recommended he continue to follow the cancer management and screening guidelines provided by his oncology and primary healthcare providers.   RECOMMENDATIONS FOR FAMILY MEMBERS: We discussed that testing for other family members may be  helpful to further elucidate this VUS result for Marc Mills.  In particular, we were interested in whether or not his sister had sequencing of this gene last year (prior to her passing) when she had genetic testing.  Marc Mills sister had testing through [facility], which included sequencing of the BRCA1/2 and PALB2 genes only.  These results were negative.  Upon contacting Marc Mills, they stated that this additional genetic information could be pulled for Marc Mills. [name] sister, but would cost $475 to do so.  Marc Mills and I discussed that this information may still not be entirely helpful for Marc Mills, since his cancer is entirely different from that of his sister.  He reported that he does not believe this additional information would not be helpful to him at this point in time.    Thus we discussed that women in this family might be at some increased risk of developing cancer, over the general population risk, simply due to the family history of cancer. We recommended women in this family have a yearly mammogram beginning at age 22, or 13 years younger than the earliest onset of cancer, an an annual clinical breast exam, and perform monthly breast self-exams. Women in this family should also have a gynecological exam as recommended by their primary provider. This includes Marc Mills daughter.  He reports that she is currently getting annual mammograms.  All family members should have a colonoscopy by age 38.  Based on Marc Mills family history, we also recommended his maternal first cousin, who was diagnosed with breast cancer at age 83 or younger and whose mother has a history of rectal cancer, have genetic counseling and testing. Marc Mills will let us know if we can be of any assistance in coordinating genetic counseling and/or testing for this family member.   FOLLOW-UP: Lastly, we discussed with Marc Mills. [name] that cancer genetics is a rapidly advancing field and it  is possible that new genetic tests will be appropriate for him and/or his family members in the future. We encouraged him to remain in contact with cancer genetics on an annual basis so we can update his personal and family histories and let him know of advances in cancer genetics that may benefit this family.   Our contact number was provided. Marc Mills. Bagnall questions were answered to his satisfaction, and she knows he is welcome to call us at anytime with additional questions or concerns.   Jeanine Luz MS Genetic Counselor [email or web address] Phone: [phone]

## 2015-01-13 ENCOUNTER — Other Ambulatory Visit: Payer: Self-pay | Admitting: Oncology

## 2015-01-13 NOTE — Progress Notes (Unsigned)
Genetics negative except for a VUS

## 2015-01-29 ENCOUNTER — Ambulatory Visit (INDEPENDENT_AMBULATORY_CARE_PROVIDER_SITE_OTHER): Payer: Medicare Other | Admitting: *Deleted

## 2015-01-29 DIAGNOSIS — Z792 Long term (current) use of antibiotics: Secondary | ICD-10-CM

## 2015-01-29 DIAGNOSIS — Z86718 Personal history of other venous thrombosis and embolism: Secondary | ICD-10-CM

## 2015-01-29 DIAGNOSIS — Z5181 Encounter for therapeutic drug level monitoring: Secondary | ICD-10-CM

## 2015-01-29 LAB — POCT INR: INR: 2.6

## 2015-02-06 ENCOUNTER — Encounter: Payer: Self-pay | Admitting: Emergency Medicine

## 2015-02-20 ENCOUNTER — Other Ambulatory Visit: Payer: Self-pay

## 2015-02-20 DIAGNOSIS — C8307 Small cell B-cell lymphoma, spleen: Secondary | ICD-10-CM

## 2015-02-21 ENCOUNTER — Other Ambulatory Visit: Payer: Self-pay | Admitting: Oncology

## 2015-02-21 ENCOUNTER — Ambulatory Visit (HOSPITAL_BASED_OUTPATIENT_CLINIC_OR_DEPARTMENT_OTHER): Payer: Medicare Other

## 2015-02-21 ENCOUNTER — Other Ambulatory Visit (HOSPITAL_BASED_OUTPATIENT_CLINIC_OR_DEPARTMENT_OTHER): Payer: Medicare Other

## 2015-02-21 VITALS — BP 126/67 | HR 64 | Temp 98.6°F

## 2015-02-21 DIAGNOSIS — Z8572 Personal history of non-Hodgkin lymphomas: Secondary | ICD-10-CM

## 2015-02-21 DIAGNOSIS — Z23 Encounter for immunization: Secondary | ICD-10-CM

## 2015-02-21 DIAGNOSIS — C8307 Small cell B-cell lymphoma, spleen: Secondary | ICD-10-CM

## 2015-02-21 DIAGNOSIS — Z9081 Acquired absence of spleen: Secondary | ICD-10-CM

## 2015-02-21 DIAGNOSIS — C8587 Other specified types of non-Hodgkin lymphoma, spleen: Secondary | ICD-10-CM | POA: Diagnosis not present

## 2015-02-21 DIAGNOSIS — E662 Morbid (severe) obesity with alveolar hypoventilation: Secondary | ICD-10-CM

## 2015-02-21 LAB — CBC WITH DIFFERENTIAL/PLATELET
BASO%: 1.6 % (ref 0.0–2.0)
Basophils Absolute: 0.1 10*3/uL (ref 0.0–0.1)
EOS%: 8.1 % — ABNORMAL HIGH (ref 0.0–7.0)
Eosinophils Absolute: 0.5 10*3/uL (ref 0.0–0.5)
HCT: 41.2 % (ref 38.4–49.9)
HGB: 13.7 g/dL (ref 13.0–17.1)
LYMPH%: 16.5 % (ref 14.0–49.0)
MCH: 30.4 pg (ref 27.2–33.4)
MCHC: 33.3 g/dL (ref 32.0–36.0)
MCV: 91.6 fL (ref 79.3–98.0)
MONO#: 1.2 10*3/uL — ABNORMAL HIGH (ref 0.1–0.9)
MONO%: 18.9 % — ABNORMAL HIGH (ref 0.0–14.0)
NEUT#: 3.3 10*3/uL (ref 1.5–6.5)
NEUT%: 54.9 % (ref 39.0–75.0)
Platelets: 307 10*3/uL (ref 140–400)
RBC: 4.5 10*6/uL (ref 4.20–5.82)
RDW: 14.8 % — ABNORMAL HIGH (ref 11.0–14.6)
WBC: 6.1 10*3/uL (ref 4.0–10.3)
lymph#: 1 10*3/uL (ref 0.9–3.3)

## 2015-02-21 LAB — COMPREHENSIVE METABOLIC PANEL (CC13)
ALT: 22 U/L (ref 0–55)
AST: 19 U/L (ref 5–34)
Albumin: 3.8 g/dL (ref 3.5–5.0)
Alkaline Phosphatase: 61 U/L (ref 40–150)
Anion Gap: 6 mEq/L (ref 3–11)
BUN: 16.5 mg/dL (ref 7.0–26.0)
CO2: 27 mEq/L (ref 22–29)
Calcium: 9.4 mg/dL (ref 8.4–10.4)
Chloride: 105 mEq/L (ref 98–109)
Creatinine: 0.7 mg/dL (ref 0.7–1.3)
EGFR: >90 mL/min/{1.73_m2} (ref >90)
Glucose: 94 mg/dl (ref 70–140)
Potassium: 4.8 mEq/L (ref 3.5–5.1)
Sodium: 138 mEq/L (ref 136–145)
Total Bilirubin: 0.38 mg/dL (ref 0.20–1.20)
Total Protein: 6.8 g/dL (ref 6.4–8.3)

## 2015-02-21 LAB — LACTATE DEHYDROGENASE (CC13): LDH: 115 U/L — ABNORMAL LOW (ref 125–245)

## 2015-02-21 MED ORDER — HAEMOPHILUS B POLYSAC CONJ VAC IM SOLR
0.5000 mL | Freq: Once | INTRAMUSCULAR | Status: AC
  Administered 2015-02-21: 0.5 mL via INTRAMUSCULAR
  Filled 2015-02-21: qty 0.5

## 2015-02-23 LAB — BETA 2 MICROGLOBULIN, SERUM: Beta-2 Microglobulin: 1.71 mg/L (ref <=2.51)

## 2015-02-26 ENCOUNTER — Ambulatory Visit (INDEPENDENT_AMBULATORY_CARE_PROVIDER_SITE_OTHER): Payer: Medicare Other | Admitting: *Deleted

## 2015-02-26 DIAGNOSIS — Z5181 Encounter for therapeutic drug level monitoring: Secondary | ICD-10-CM | POA: Diagnosis not present

## 2015-02-26 DIAGNOSIS — Z86718 Personal history of other venous thrombosis and embolism: Secondary | ICD-10-CM

## 2015-02-26 DIAGNOSIS — Z792 Long term (current) use of antibiotics: Secondary | ICD-10-CM

## 2015-02-26 LAB — POCT INR: INR: 3.1

## 2015-03-16 ENCOUNTER — Telehealth: Payer: Self-pay

## 2015-03-16 DIAGNOSIS — Z6841 Body Mass Index (BMI) 40.0 and over, adult: Secondary | ICD-10-CM | POA: Diagnosis not present

## 2015-03-16 DIAGNOSIS — Z7901 Long term (current) use of anticoagulants: Secondary | ICD-10-CM | POA: Diagnosis not present

## 2015-03-16 DIAGNOSIS — Z9081 Acquired absence of spleen: Secondary | ICD-10-CM | POA: Diagnosis not present

## 2015-03-16 DIAGNOSIS — D72829 Elevated white blood cell count, unspecified: Secondary | ICD-10-CM | POA: Diagnosis not present

## 2015-03-16 DIAGNOSIS — Z86718 Personal history of other venous thrombosis and embolism: Secondary | ICD-10-CM | POA: Diagnosis not present

## 2015-03-16 DIAGNOSIS — Z885 Allergy status to narcotic agent status: Secondary | ICD-10-CM | POA: Diagnosis not present

## 2015-03-16 DIAGNOSIS — Z8572 Personal history of non-Hodgkin lymphomas: Secondary | ICD-10-CM | POA: Diagnosis not present

## 2015-03-16 DIAGNOSIS — R609 Edema, unspecified: Secondary | ICD-10-CM | POA: Diagnosis not present

## 2015-03-16 DIAGNOSIS — Z86711 Personal history of pulmonary embolism: Secondary | ICD-10-CM | POA: Diagnosis not present

## 2015-03-16 DIAGNOSIS — G4733 Obstructive sleep apnea (adult) (pediatric): Secondary | ICD-10-CM | POA: Diagnosis not present

## 2015-03-16 DIAGNOSIS — F329 Major depressive disorder, single episode, unspecified: Secondary | ICD-10-CM | POA: Diagnosis not present

## 2015-03-16 DIAGNOSIS — I2699 Other pulmonary embolism without acute cor pulmonale: Secondary | ICD-10-CM | POA: Diagnosis not present

## 2015-03-16 DIAGNOSIS — A419 Sepsis, unspecified organism: Secondary | ICD-10-CM | POA: Diagnosis not present

## 2015-03-16 DIAGNOSIS — L03115 Cellulitis of right lower limb: Secondary | ICD-10-CM | POA: Diagnosis not present

## 2015-03-16 DIAGNOSIS — R509 Fever, unspecified: Secondary | ICD-10-CM | POA: Diagnosis not present

## 2015-03-16 DIAGNOSIS — T814XXD Infection following a procedure, subsequent encounter: Secondary | ICD-10-CM | POA: Diagnosis not present

## 2015-03-16 NOTE — Telephone Encounter (Signed)
Per Dr. Everlene Farrier, called in a RX for patient to [facility].  Doxycycline 100 mg BID #28 no refills. Patient aware.

## 2015-03-17 DIAGNOSIS — Z6841 Body Mass Index (BMI) 40.0 and over, adult: Secondary | ICD-10-CM

## 2015-03-17 DIAGNOSIS — Z95828 Presence of other vascular implants and grafts: Secondary | ICD-10-CM | POA: Insufficient documentation

## 2015-03-17 DIAGNOSIS — Z7901 Long term (current) use of anticoagulants: Secondary | ICD-10-CM | POA: Insufficient documentation

## 2015-03-18 ENCOUNTER — Encounter (HOSPITAL_COMMUNITY): Payer: Self-pay

## 2015-03-18 ENCOUNTER — Inpatient Hospital Stay (HOSPITAL_COMMUNITY)
Admission: EM | Admit: 2015-03-18 | Discharge: 2015-03-21 | DRG: 603 | Disposition: A | Discharge status: Home / Self Care | Payer: Medicare Other | Attending: Family Medicine | Admitting: Family Medicine

## 2015-03-18 DIAGNOSIS — L03115 Cellulitis of right lower limb: Secondary | ICD-10-CM

## 2015-03-18 DIAGNOSIS — Z9081 Acquired absence of spleen: Secondary | ICD-10-CM | POA: Diagnosis not present

## 2015-03-18 DIAGNOSIS — Z8572 Personal history of non-Hodgkin lymphomas: Secondary | ICD-10-CM | POA: Diagnosis not present

## 2015-03-18 DIAGNOSIS — F329 Major depressive disorder, single episode, unspecified: Secondary | ICD-10-CM | POA: Diagnosis present

## 2015-03-18 DIAGNOSIS — Z800 Family history of malignant neoplasm of digestive organs: Secondary | ICD-10-CM

## 2015-03-18 DIAGNOSIS — R509 Fever, unspecified: Secondary | ICD-10-CM | POA: Insufficient documentation

## 2015-03-18 DIAGNOSIS — Z87891 Personal history of nicotine dependence: Secondary | ICD-10-CM | POA: Diagnosis not present

## 2015-03-18 DIAGNOSIS — I8393 Asymptomatic varicose veins of bilateral lower extremities: Secondary | ICD-10-CM | POA: Diagnosis present

## 2015-03-18 DIAGNOSIS — Z801 Family history of malignant neoplasm of trachea, bronchus and lung: Secondary | ICD-10-CM | POA: Diagnosis not present

## 2015-03-18 DIAGNOSIS — T814XXD Infection following a procedure, subsequent encounter: Secondary | ICD-10-CM | POA: Diagnosis not present

## 2015-03-18 DIAGNOSIS — Z6841 Body Mass Index (BMI) 40.0 and over, adult: Secondary | ICD-10-CM

## 2015-03-18 DIAGNOSIS — Z86718 Personal history of other venous thrombosis and embolism: Secondary | ICD-10-CM

## 2015-03-18 DIAGNOSIS — Z803 Family history of malignant neoplasm of breast: Secondary | ICD-10-CM | POA: Diagnosis not present

## 2015-03-18 DIAGNOSIS — IMO0001 Reserved for inherently not codable concepts without codable children: Secondary | ICD-10-CM

## 2015-03-18 DIAGNOSIS — G4733 Obstructive sleep apnea (adult) (pediatric): Secondary | ICD-10-CM | POA: Diagnosis present

## 2015-03-18 DIAGNOSIS — Z8249 Family history of ischemic heart disease and other diseases of the circulatory system: Secondary | ICD-10-CM

## 2015-03-18 DIAGNOSIS — I872 Venous insufficiency (chronic) (peripheral): Secondary | ICD-10-CM | POA: Diagnosis present

## 2015-03-18 DIAGNOSIS — Z86711 Personal history of pulmonary embolism: Secondary | ICD-10-CM

## 2015-03-18 DIAGNOSIS — Z7901 Long term (current) use of anticoagulants: Secondary | ICD-10-CM

## 2015-03-18 DIAGNOSIS — Z96652 Presence of left artificial knee joint: Secondary | ICD-10-CM | POA: Diagnosis present

## 2015-03-18 DIAGNOSIS — L0390 Cellulitis, unspecified: Secondary | ICD-10-CM | POA: Diagnosis present

## 2015-03-18 LAB — COMPREHENSIVE METABOLIC PANEL
ALT: 27 U/L (ref 17–63)
AST: 40 U/L (ref 15–41)
Albumin: 3.1 g/dL — ABNORMAL LOW (ref 3.5–5.0)
Alkaline Phosphatase: 60 U/L (ref 38–126)
Anion gap: 7 (ref 5–15)
BUN: 9 mg/dL (ref 6–20)
CO2: 20 mmol/L — ABNORMAL LOW (ref 22–32)
Calcium: 8.2 mg/dL — ABNORMAL LOW (ref 8.9–10.3)
Chloride: 108 mmol/L (ref 101–111)
Creatinine, Ser: 0.74 mg/dL (ref 0.61–1.24)
GFR calc Af Amer: >60 mL/min (ref >60)
GFR calc non Af Amer: >60 mL/min (ref >60)
Glucose, Bld: 107 mg/dL — ABNORMAL HIGH (ref 65–99)
Potassium: 4.9 mmol/L (ref 3.5–5.1)
Sodium: 135 mmol/L (ref 135–145)
Total Bilirubin: 1.1 mg/dL (ref 0.3–1.2)
Total Protein: 6.3 g/dL — ABNORMAL LOW (ref 6.5–8.1)

## 2015-03-18 LAB — CBC WITH DIFFERENTIAL/PLATELET
Basophils Absolute: 0 10*3/uL (ref 0.0–0.1)
Basophils Relative: 0 %
Eosinophils Absolute: 0.1 10*3/uL (ref 0.0–0.7)
Eosinophils Relative: 0 %
HCT: 36.7 % — ABNORMAL LOW (ref 39.0–52.0)
Hemoglobin: 12.4 g/dL — ABNORMAL LOW (ref 13.0–17.0)
Lymphocytes Relative: 5 %
Lymphs Abs: 0.8 10*3/uL (ref 0.7–4.0)
MCH: 30.7 pg (ref 26.0–34.0)
MCHC: 33.8 g/dL (ref 30.0–36.0)
MCV: 90.8 fL (ref 78.0–100.0)
Monocytes Absolute: 1.1 10*3/uL — ABNORMAL HIGH (ref 0.1–1.0)
Monocytes Relative: 7 %
Neutro Abs: 12.7 10*3/uL — ABNORMAL HIGH (ref 1.7–7.7)
Neutrophils Relative %: 87 %
Platelets: ADEQUATE 10*3/uL (ref 150–400)
RBC: 4.04 MIL/uL — ABNORMAL LOW (ref 4.22–5.81)
RDW: 15.8 % — ABNORMAL HIGH (ref 11.5–15.5)
WBC: 14.5 10*3/uL — ABNORMAL HIGH (ref 4.0–10.5)

## 2015-03-18 LAB — PROTIME-INR
INR: 1.88 — ABNORMAL HIGH (ref 0.00–1.49)
Prothrombin Time: 21.6 seconds — ABNORMAL HIGH (ref 11.6–15.2)

## 2015-03-18 MED ORDER — CITALOPRAM HYDROBROMIDE 20 MG PO TABS
20.0000 mg | ORAL_TABLET | Freq: Every day | ORAL | Status: DC
  Administered 2015-03-19 – 2015-03-21 (×3): 20 mg via ORAL
  Filled 2015-03-18 (×3): qty 1

## 2015-03-18 MED ORDER — SODIUM CHLORIDE 0.9 % IV SOLN: 1500.0000 mg | Freq: Two times a day (BID) | INTRAVENOUS | Status: DC

## 2015-03-18 MED ORDER — ONDANSETRON HCL 4 MG/2ML IJ SOLN: 4.0000 mg | Freq: Four times a day (QID) | INTRAMUSCULAR | Status: DC | PRN

## 2015-03-18 MED ORDER — PIPERACILLIN-TAZOBACTAM 3.375 G IVPB 30 MIN: 3.3750 g | Freq: Once | INTRAVENOUS | Status: DC

## 2015-03-18 MED ORDER — VITAMIN D 1000 UNITS PO TABS
1000.0000 [IU] | ORAL_TABLET | Freq: Every morning | ORAL | Status: DC
  Administered 2015-03-19 – 2015-03-21 (×3): 1000 [IU] via ORAL
  Filled 2015-03-18 (×3): qty 1

## 2015-03-18 MED ORDER — ACETAMINOPHEN 325 MG PO TABS
650.0000 mg | ORAL_TABLET | Freq: Four times a day (QID) | ORAL | Status: DC | PRN
  Administered 2015-03-19 – 2015-03-21 (×9): 650 mg via ORAL
  Filled 2015-03-18 (×9): qty 2

## 2015-03-18 MED ORDER — ACETAMINOPHEN 650 MG RE SUPP: 650.0000 mg | Freq: Four times a day (QID) | RECTAL | Status: DC | PRN

## 2015-03-18 MED ORDER — SODIUM CHLORIDE 0.9 % IV SOLN
INTRAVENOUS | Status: DC
  Administered 2015-03-19 – 2015-03-20 (×3): via INTRAVENOUS

## 2015-03-18 MED ORDER — SODIUM CHLORIDE 0.9 % IV BOLUS (SEPSIS)
1000.0000 mL | Freq: Once | INTRAVENOUS | Status: AC
Start: 2015-03-18 — End: 2015-03-18
  Administered 2015-03-18: 1000 mL via INTRAVENOUS

## 2015-03-18 MED ORDER — MAGNESIUM OXIDE 400 (241.3 MG) MG PO TABS
400.0000 mg | ORAL_TABLET | Freq: Every day | ORAL | Status: DC
  Administered 2015-03-19 – 2015-03-21 (×3): 400 mg via ORAL
  Filled 2015-03-18 (×6): qty 1

## 2015-03-18 MED ORDER — PIPERACILLIN-TAZOBACTAM 3.375 G IVPB
3.3750 g | Freq: Three times a day (TID) | INTRAVENOUS | Status: DC
  Administered 2015-03-19 – 2015-03-21 (×8): 3.375 g via INTRAVENOUS
  Filled 2015-03-18 (×10): qty 50

## 2015-03-18 MED ORDER — ONDANSETRON HCL 4 MG PO TABS: 4.0000 mg | ORAL_TABLET | Freq: Four times a day (QID) | ORAL | Status: DC | PRN

## 2015-03-18 MED ORDER — CALCIUM CARBONATE 1250 (500 CA) MG PO TABS
600.0000 mg | ORAL_TABLET | Freq: Every morning | ORAL | Status: DC
  Administered 2015-03-19 – 2015-03-21 (×3): 625 mg via ORAL
  Filled 2015-03-18 (×3): qty 2

## 2015-03-18 MED ORDER — VANCOMYCIN HCL 10 G IV SOLR
2000.0000 mg | Freq: Once | INTRAVENOUS | Status: DC
  Filled 2015-03-18: qty 2000

## 2015-03-18 MED ORDER — VANCOMYCIN HCL IN DEXTROSE 1-5 GM/200ML-% IV SOLN: 1000.0000 mg | Freq: Once | INTRAVENOUS | Status: DC

## 2015-03-18 MED ORDER — SODIUM CHLORIDE 0.9 % IJ SOLN
3.0000 mL | Freq: Two times a day (BID) | INTRAMUSCULAR | Status: DC
  Administered 2015-03-19 – 2015-03-21 (×2): 3 mL via INTRAVENOUS

## 2015-03-18 NOTE — ED Notes (Signed)
Attempted report, left call back number of [phone].

## 2015-03-18 NOTE — Progress Notes (Addendum)
ANTIBIOTIC CONSULT NOTE - INITIAL  Pharmacy Consult for Vancomycin/Zosyn  Indication: Cellulitis  Allergies  Allergen Reactions  . Hydrocodone Itching and Other (See Comments)    Severe dizziness, Gives me chills  . Oxycodone Itching and Other (See Comments)    Severe dizziness, Gives me chills    Patient Measurements: Height: 5\' 11"  (180.3 cm) Weight: (!) 357 lb 9.6 oz (162.206 kg) IBW/kg (Calculated) : 75.3  Vital Signs: Temp: 98.7 F (37.1 C) (11/13 2236) Temp Source: Oral (11/13 2236) BP: 103/92 mmHg (11/13 2315) Pulse Rate: 80 (11/13 2315)  Labs:  Recent Labs  03/18/15 2043  WBC 14.5*  HGB 12.4*  PLT PLATELET CLUMPS NOTED ON SMEAR, COUNT APPEARS ADEQUATE  CREATININE 0.74   Estimated Creatinine Clearance: 135.7 mL/min (by C-G formula based on Cr of 0.74).  Medical History: Past Medical History  Diagnosis Date  . Morbid obesity ([place])   . History of pulmonary embolus (PE)   . History of lymphoma   . Incisional hernia   . Arthritis   . Blood transfusion without reported diagnosis   . DVT (deep venous thrombosis) ([place])     2011 right leg  . Anorectal fistula   . OSA on CPAP     cpap  10 yrs  . Varicose veins of lower extremities     pe  . Cancer (HCC)     lymphoma hx  . Cataract   . Cellulitis 04/04/2013    Assessment: 69 y/o M with right leg cellulitis, WBC 14.5, T-max 99.4, renal function appears ok, other labs/meds reviewed. Patient was discharged today from another facility where he had been receiving vancomycin 1500 mg IV q12h and had a trough of 8.5 on that dose.   Goal of Therapy:  Vancomycin trough level 10-15 mcg/ml  Plan:  -Vancomycin 2000 mg IV q12h -Zosyn 3.375G IV q8h to be infused over 4 hours -Check vancomycin trough at steady state  Narda Bonds 03/18/2015,11:53 PM

## 2015-03-18 NOTE — ED Provider Notes (Signed)
Arrival Date & Time: 03/18/15 & 1757 History   Chief Complaint  Patient presents with  . Cellulitis   HPI Marc Mills is a 69 y.o. male who presents for assessment of known right lower extremity cellulitis after being in [facility] for the past 2 days on intravenous vancomycin. Patient was discharged on doxycycline. Friday at 3 AM patient awoke with severe pain in right lower extremity present the hospital with concerns for swelling and redness DVT study was done which was negative. Patient's laboratory work remarkable for significant leukocytosis and patient developed severe sepsis with blood pressures below 65 map. Patient does not feel that he was fully recovered upon discharge and arrived home to have mild confusion intermittent fevers chills with rigors. Patient has no emesis or diarrhea headache neck pain abdominal pain or back pain.  Past medical remarkable for morbid obesity prior pulmonary embolism patient states that he takes no immunosuppression medicines and has no diabetes however had splenectomy several years prior for lymphoma diagnosis.  Past Medical History  I reviewed & agree with nursing's documentation of PMHx, PSHx, SHx & FHx. Past Medical History  Diagnosis Date  . Morbid obesity ([place])   . History of pulmonary embolus (PE)   . History of lymphoma   . Incisional hernia   . Arthritis   . Blood transfusion without reported diagnosis   . DVT (deep venous thrombosis) ([place])     2011 right leg  . Anorectal fistula   . OSA on CPAP     cpap  10 yrs  . Varicose veins of lower extremities     pe  . Cancer (HCC)     lymphoma hx  . Cataract   . Cellulitis 04/04/2013   Past Surgical History  Procedure Laterality Date  . Splenectomy    . Prostate surgery      (patient denies)  . Ivc filter    . Tonsillectomy      age 39  . Eye surgery Left 03    cat, detached ret    . Hernia repair    . Total knee arthroplasty Left 11/02/2012    Procedure: TOTAL  KNEE ARTHROPLASTY with revision tibia;  Surgeon: Garald Balding, MD;  Location: [place];  Service: Orthopedics;  Laterality: Left;  . Fracture surgery    . Inguinal lymph node biopsy [name] 11/15/2013    Procedure: RIGHT INGUINAL LYMPH NODE BIOPSY;  Surgeon: Gwenyth Ober, MD;  Location: [facility];  Service: General;  Laterality: [name];   Social History   Social History  . Marital Status: Married    Spouse Name: [name]  . Number of Children: [name]  . Years of Education: [name]   Social History Main Topics  . Smoking status: Former Smoker -- 2.00 packs/day for 20 years    Types: Cigarettes    Quit date: 05/05/1984  . Smokeless tobacco: Never Used  . Alcohol Use: 0.0 oz/week    0 Standard drinks or equivalent per week     Comment: maybe 1 beer/month  . Drug Use: No  . Sexual Activity: Yes    Birth Control/ Protection: None   Other Topics Concern  . None   Social History Narrative   Family History  Problem Relation Age of Onset  . Heart disease Father   . Varicose Veins Father   . Heart attack Father     dx. 71s  . Heart disease Mother 30  . Varicose Veins Mother   . Pancreatic cancer Sister  30  . Cancer Maternal Grandmother     either stomach or pancreatic primary   . Pancreatic cancer Maternal Grandfather     dx. 80s  . Lung cancer Maternal Aunt     secondhand smoke exposure  . Heart attack Paternal Uncle   . Rectal cancer Maternal Aunt     dx. 61s  . Cancer Maternal Aunt     unknown type  . Diabetes Paternal Uncle   . Cancer Cousin     unknown type  . Breast cancer Cousin     dx. 50s    Review of Systems   Complete Review of Systems obtained and is negative except as stated in HPI.  Allergies  Hydrocodone and Oxycodone  Home Medications   Prior to Admission medications   Medication Sig Start Date End Date Taking? Authorizing Provider  acetaminophen (TYLENOL) 500 MG tablet Take 1,000 mg by mouth every 6 (six) hours as needed for mild pain.   Yes Historical Provider,  MD  b complex vitamins tablet Take 1 tablet by mouth every morning.    Yes Historical Provider, MD  calcium carbonate (OS-CAL) 600 MG TABS Take 600 mg by mouth every morning.    Yes Historical Provider, MD  cholecalciferol (VITAMIN D) 1000 UNITS tablet Take 1,000 Units by mouth every morning.    Yes Historical Provider, MD  citalopram (CELEXA) 20 MG tablet Take 1 tablet (20 mg total) by mouth daily. 05/02/14  Yes [name], MD  Magnesium 400 MG CAPS Take 400 mg by mouth daily.    Yes Historical Provider, MD  Multiple Vitamin (THERAGRAN PO) Take 1 tablet by mouth daily.   Yes Historical Provider, MD  Nutritional Supplements (SALMON OIL) CAPS Take 1 capsule by mouth daily.   Yes Historical Provider, MD  sildenafil (VIAGRA) 100 MG tablet Take 1 tablet (100 mg total) by mouth daily as needed for erectile dysfunction. Splits the tablet in half prn 06/29/14  Yes [name], MD  warfarin (COUMADIN) 5 MG tablet Take 1 tablet (5 mg total) by mouth 1 day or 1 dose. As directed Patient taking differently: Take 5 mg by mouth every morning. As directed 06/29/14  Yes [name], MD    Physical Exam  BP 132/60 mmHg  Pulse 81  Temp(Src) 98 F (36.7 C) (Oral)  Resp 20  Ht 6' (1.829 m)  Wt 354 lb 1.6 oz (160.619 kg)  BMI 48.01 kg/m2  SpO2 93% Physical Exam CONST: male, in NAD. Appears WD/WN & stated age. HEAD: [place]/AT. EYES: PERRL. No conjunctival injection & lids symmetrical. ENMT: External nose & ears atraumatic. MM moist. Oropharynx w/o swelling or exudates. NECK: Supple, w/o meningismus. Trachea midline. JVD & stridor absent. CVS: S1/S2 audible w/o gallops or obvious murmurs.  Peripheral pulses 2+ & equal in all extremities. Cap refill < 2 seconds. RESP: Respiratory effort normal. CTAB w/o wheeze. GI: NT/ND, w/o rebound or guarding. BS audible. MSK: Extremities w/ ttp over the distal aspect of LLE, w/o deformity or cyanosis. Joints w/o swelling. SKIN: Warm & dry w/o open wound. No rash or  lesion. NEURO: CN II-XIII grossly intact. Sensation & Strength w/o deficit. W/o clonus. PSYCH: Mood & affect appropriate. Cooperative.  ED Course  Procedures  Labs Review Labs Reviewed  CBC WITH DIFFERENTIAL/PLATELET - Abnormal; Notable for the following:    WBC 14.5 (*)    RBC 4.04 (*)    Hemoglobin 12.4 (*)    HCT 36.7 (*)    RDW 15.8 (*)  Neutro Abs 12.7 (*)    Monocytes Absolute 1.1 (*)    All other components within normal limits  COMPREHENSIVE METABOLIC PANEL - Abnormal; Notable for the following:    CO2 20 (*)    Glucose, Bld 107 (*)    Calcium 8.2 (*)    Total Protein 6.3 (*)    Albumin 3.1 (*)    All other components within normal limits  PROTIME-INR - Abnormal; Notable for the following:    Prothrombin Time 21.6 (*)    INR 1.88 (*)    All other components within normal limits  CBC - Abnormal; Notable for the following:    WBC 13.3 (*)    RBC 3.88 (*)    Hemoglobin 11.7 (*)    HCT 35.0 (*)    RDW 15.8 (*)    All other components within normal limits  BASIC METABOLIC PANEL - Abnormal; Notable for the following:    Glucose, Bld 128 (*)    Calcium 8.2 (*)    All other components within normal limits  PROTIME-INR - Abnormal; Notable for the following:    Prothrombin Time 21.7 (*)    INR 1.90 (*)    All other components within normal limits  HEMOGLOBIN A1C - Abnormal; Notable for the following:    Hgb A1c MFr Bld 5.8 (*)    All other components within normal limits  GLUCOSE, CAPILLARY - Abnormal; Notable for the following:    Glucose-Capillary 102 (*)    All other components within normal limits  CBC - Abnormal; Notable for the following:    WBC 13.0 (*)    RBC 3.74 (*)    Hemoglobin 11.5 (*)    HCT 33.7 (*)    RDW 15.7 (*)    All other components within normal limits  BASIC METABOLIC PANEL - Abnormal; Notable for the following:    Glucose, Bld 107 (*)    Calcium 8.3 (*)    All other components within normal limits  PROTIME-INR - Abnormal; Notable  for the following:    Prothrombin Time 21.9 (*)    INR 1.92 (*)    All other components within normal limits  CULTURE, BLOOD (ROUTINE X 2)  CULTURE, BLOOD (ROUTINE X 2)  HIV ANTIBODY (ROUTINE TESTING)  CBC  BASIC METABOLIC PANEL  PROTIME-INR    Imaging Review No results found.  Laboratory and Imaging results were personally reviewed by myself and used in the medical decision making of this patient's treatment and disposition.  EKG Interpretation  EKG Interpretation  Date/Time:    Ventricular Rate:    PR Interval:    QRS Duration:   QT Interval:    QTC Calculation:   R Axis:     Text Interpretation:        MDM  ISAISH ALEMU is a 69 y.o. male with H&P as above. ED clinical course as follows:  Patient without AMS, hypotension or signs for poor end-organ perfusion and lactic acid within normal limits, therefore sepsis unlikely at this time. Patient however presents with cellulitis that is advanced and a significant burden. Patient appears to be improving however in light of fever and chills with worsening symptoms briefly at home following discharge, I believe patient should be observed for IV ABx and continued management given immunocompromised status of removed spleen.    I consulted the Family Medicine service.  Told family medicine I would consider adding Pseudomonas coverage given patient is an avid daily swimmer. In light of my concerns for  severe cellulitis, I discussed the patient's complete H&P along with their ED clinical course to now with the Family medicine service. shared decision made to admit the patient for futher management. Per discussion with admitting service, level of care agreed upon and requested interventions provided prior to transport.   Disposition: Admit   Clinical Impression: No diagnosis found. Patient care discussed with Dr. Oleta Mouse, who oversaw their evaluation & treatment & voiced agreement. House Officer: Voncille Lo, MD, Emergency  Medicine.  Voncille Lo, MD 03/20/15 2227  Forde Dandy, MD 03/21/15 [phone]

## 2015-03-18 NOTE — Progress Notes (Signed)
Received report from [place], [place] in the ED @ 2250.

## 2015-03-18 NOTE — ED Notes (Signed)
Pt just returned from [place] where he was discharged from [facility] for cellulitis right leg.  On way home pt started feeling like he had fever. Pt took Tylenol @ 4pm.

## 2015-03-18 NOTE — ED Notes (Signed)
MD at bedside. 

## 2015-03-18 NOTE — ED Notes (Signed)
Room on unit changed to 5west room 23.

## 2015-03-18 NOTE — H&P (Signed)
[facility] Admission History and Physical Service Pager: [phone]  Patient name: Marc Mills Medical record number: [id number] Date of birth: 1945/10/08 Age: 69 y.o. Gender: male  Primary Care Provider: Jenny Reichmann, MD Consultants: None Code Status: Full  Chief Complaint: RLE cellulitis  Assessment and Plan: Marc Mills is a 69 y.o. male presenting with RLE cellulitis . 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  1. RLE cellulitis: Nonpurulent, recurrent (5th episode) at risk with obesity and h/o DVT with likely venous insufficiency. Considered severe based on recent sepsis and high risk with h/o splenectomy for marginal cell lymphoma. Was just recently discharged from [facility] today. Was treated with Vancomycin and discharged on Doxycycline. Was hypotensive at [facility]. While in the ED here, has been afebrile with stable vitals. WBC 14.5. Hb A1c was normal at recent hospitalization.  - Admit to [facility], attending Dr. Gwendlyn Deutscher. - Vanc/Zosyn per pharm. Pt has been in the water recently so there is some concern for Pseudomonas. - Tylenol for pain/fever, Zofran for nausea - Will get blood cultures because none were drawn at [facility] and Pt is immunocompromised s/p splenectomy. Also r/o HIV. - CBC and BMET in the am - Cardiac monitoring   2. Hx PE/DVT x 2: On Coumadin. Has an IVC filter since 2001. PT 21.6, INR 1.88 on admission. Venous dopplers were negative for DVT at [facility] on 11/11. - Coumadin per pharm, will remain aware of drug interactions Re: abx.   3. Obstructive Sleep Apnea:  - CPAP  4. Depression:  - Continue home med: Celexa 28m daily  FEN/GI: Heart healthy diet, MIVFs at 1559mhr Prophylaxis: On Coumadin for hx DVT/PE x 2  Disposition: Admit to [facility], attending Dr.  EnGwendlyn DeutscherWill likely require ~ 48 hours of antibiotics. Discharge home pending clinical improvement.   History of Present Illness:  Marc SITESs a [age].o. male presenting with RLE cellulitis since 3am Friday morning (11/11). [place] to go to the hospital. [facility] was really low (88/45) and he was given 4 L of fluid. He also received IV Vancomycin and the cellulitis improved. He was discharged today (11/13) on PO Doxycycline. [place], he started feeling bad again. He had chills, a headache, and was exhausted. He called his primary care doctor again and was told to come to the ED. He feels like the cellulitis has gotten worse throughout the day today. He has tried Tylenol, which helps for about 4 hours and then wears off and he experiences shortness of breath and tachycardia. He monitors his HR with his FitBit and he normally runs around 59. He denies any nausea, RLE pain, or chest pain. He has had good PO intake. His Tmax today was 99.8. He denies any tick bites. He swims 6 days a week and was last in the water on Wednesday. He has no recent immobilization. [place] to walk around.  Of note, this is his 5th episode of cellulitis in the last 4 years. He has had cellulitis 4 times on the right and one time on the left. He tends to get cellulitis in November and December when the bottoms  of his feet get dry. He uses cream on his feet every day, but they still remain dry. He has noticed a slit on the bottom of his foot, which has happened right before he has gotten cellulitis in the past.   He has a history of DVT in his RLE x 2, as well as PE x 2 in 2000 and 2001. He had an IVC filter placed in 2001. At baseline, his RLE is more swollen than his LLE. He notes that  his RLE is much more swollen than usual right now. He also has a history of lymphoma, which was cured after a splenectomy 5 years ago.  In the ED, he was given a 1L bolus. Vitals were stable. Labs were significant for a WBC 14.5, PT 21.6, INR 1.88. He was admitted for IV antibiotics.       Review Of Systems: Per HPI with the following additions: None Otherwise the remainder of the systems were negative.  Patient Active Problem List   Diagnosis Date Noted  . Cellulitis 03/18/2015  . Genetic testing 01/12/2015  . Postoperative wound infection 12/09/2013  . Postop check 11/24/2013  . Ulcer of lower limb, unspecified 08/24/2013  . Inguinal lymphadenopathy 08/16/2013  . Encounter for therapeutic drug monitoring 06/09/2013  . Cellulitis of left lower extremity 04/04/2013  . Osteoarthritis of left knee 11/02/2012  . COPD, mild ([place]) 10/18/2012  . Cellulitis of right lower extremity 04/30/2012  . 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 Status post splenectomy 04/29/2012  . Incisional hernia 05/27/2011  . Splenic marginal zone b-cell lymphoma ([place]) 04/16/2010  . Morbid obesity ([place]) 04/19/2008  . Obstructive sleep apnea 04/19/2008  . VARICOSE VEINS, LOWER EXTREMITIES 04/19/2008  . PULMONARY EMBOLISM, HX OF 04/19/2008    Past Medical History: Past Medical History  Diagnosis Date  . Morbid obesity ([place])   . History of pulmonary embolus (PE)   . History of lymphoma   . Incisional hernia   . Arthritis   . Blood transfusion without reported diagnosis   . DVT (deep venous thrombosis) ([place])     2011 right leg  . Anorectal fistula   . OSA on CPAP     cpap  10 yrs  . Varicose veins of lower extremities     pe  . Cancer (HCC)     lymphoma hx  . Cataract   . Cellulitis 04/04/2013    Past Surgical History: Past Surgical History  Procedure Laterality Date  . Splenectomy    . Prostate surgery      (patient denies)  . Ivc filter    . Tonsillectomy      age 58  . Eye  surgery Left 03    cat, detached ret    . Hernia repair    . Total knee arthroplasty Left 11/02/2012    Procedure: TOTAL KNEE ARTHROPLASTY with revision tibia;  Surgeon: Garald Balding, MD;  Location: [facility];  Service: Orthopedics;  Laterality: Left;  . Fracture surgery    . Inguinal lymph node biopsy [name] 11/15/2013    Procedure: RIGHT INGUINAL LYMPH NODE BIOPSY;  Surgeon: Gwenyth Ober, MD;  Location: [facility];  Service: General;  Laterality: [name];    Social History: Social History  Substance Use Topics  . Smoking status: Former Smoker -- 2.00 packs/day for 20 years    Types: Cigarettes    Quit date: 05/05/1984  . Smokeless tobacco: Never Used  . Alcohol Use: 0.0 oz/week    0 Standard  drinks or equivalent per week     Comment: maybe 1 beer/month   Additional social history: None Please also refer to relevant sections of EMR.  Family History: Family History  Problem Relation Age of Onset  . Heart disease Father   . Varicose Veins Father   . Heart attack Father     dx. 28s  . Heart disease Mother 34  . Varicose Veins Mother   . Pancreatic cancer Sister 3  . Cancer Maternal Grandmother     either stomach or pancreatic primary   . Pancreatic cancer Maternal Grandfather     dx. 69s  . Lung cancer Maternal Aunt     secondhand smoke exposure  . Heart attack Paternal Uncle   . Rectal cancer Maternal Aunt     dx. 69s  . Cancer Maternal Aunt     unknown type  . Diabetes Paternal Uncle   . Cancer Cousin     unknown type  . Breast cancer Cousin     dx. 50s    Allergies and Medications: Allergies  Allergen Reactions  . Hydrocodone Itching and Other (See Comments)    Severe dizziness, Gives me chills  . Oxycodone Itching and Other (See Comments)    Severe dizziness, Gives me chills   No current facility-administered medications on file prior to encounter.   Current Outpatient Prescriptions on File Prior to Encounter  Medication Sig Dispense Refill  . acetaminophen  (TYLENOL) 500 MG tablet Take 1,000 mg by mouth every 6 (six) hours as needed for mild pain.    Marland Kitchen b complex vitamins tablet Take 1 tablet by mouth every morning.     . calcium carbonate (OS-CAL) 600 MG TABS Take 600 mg by mouth every morning.     . cholecalciferol (VITAMIN D) 1000 UNITS tablet Take 1,000 Units by mouth every morning.     . citalopram (CELEXA) 20 MG tablet Take 1 tablet (20 mg total) by mouth daily. 30 tablet 5  . Magnesium 400 MG CAPS Take 400 mg by mouth daily.     . sildenafil (VIAGRA) 100 MG tablet Take 1 tablet (100 mg total) by mouth daily as needed for erectile dysfunction. Splits the tablet in half prn 10 tablet 11  . warfarin (COUMADIN) 5 MG tablet Take 1 tablet (5 mg total) by mouth 1 day or 1 dose. As directed (Patient taking differently: Take 5 mg by mouth every morning. As directed) 30 tablet 11    Objective: BP 119/76 mmHg  Pulse 85  Temp(Src) 99.4 F (37.4 C) (Oral)  Resp 16  Ht _0  (1.803 m)  Wt 357 lb 9.6 oz (162.206 kg)  BMI 49.90 kg/m2  SpO2 99% Exam: General: Well-appearing male, pleasant and conversational, in NAD HEENT: [place]/AT, EOMI, no scleral icterus, MMM Neck: Supple Cardiovascular: RRR, no murmurs, 2+ DP pulses bilaterally Respiratory: CTAB, no wheezes Abdomen: +BS, soft, non-tender, non-distended MSK: 2+ pitting edema to the knee on the right, 1+ mild pitting edema on the left Skin: RLE is warm to the touch, see pictures below.        Neuro: Awake, alert, oriented; CN 2-12 grossly intact; no focal deficits Psych: Appropriate mood and affect.  Labs and Imaging: CBC BMET   Recent Labs Lab 03/18/15 2043  WBC 14.5*  HGB 12.4*  HCT 36.7*  PLT PLATELET CLUMPS NOTED ON SMEAR, COUNT APPEARS ADEQUATE    Recent Labs Lab 03/18/15 2043  NA 135  K 4.9  CL 108  CO2 20*  BUN 9  CREATININE 0.74  GLUCOSE 107*  CALCIUM 8.2*     PT 21.6 INR 1.88  Sela Hua, MD 03/18/2015, 10:09 PM PGY-1, [place] Intern pager: [phone], text pages welcome  I have seen and evaluated the patient with Dr. Brett Albino. I am in agreement with the note above in its revised form. My additions are in red.  Dorla Guizar B. Bonner Puna, MD, PGY-3 03/18/2015 11:33 PM

## 2015-03-18 NOTE — ED Notes (Signed)
Brought patient back to room with family in tow; patient undressed, in gown, on continuous pulse oximetry and blood pressure cuff; visitor at bedside 

## 2015-03-19 DIAGNOSIS — Z86718 Personal history of other venous thrombosis and embolism: Secondary | ICD-10-CM

## 2015-03-19 DIAGNOSIS — R509 Fever, unspecified: Secondary | ICD-10-CM | POA: Insufficient documentation

## 2015-03-19 DIAGNOSIS — L03115 Cellulitis of right lower limb: Principal | ICD-10-CM

## 2015-03-19 DIAGNOSIS — Z86711 Personal history of pulmonary embolism: Secondary | ICD-10-CM

## 2015-03-19 LAB — CBC
HCT: 35 % — ABNORMAL LOW (ref 39.0–52.0)
Hemoglobin: 11.7 g/dL — ABNORMAL LOW (ref 13.0–17.0)
MCH: 30.2 pg (ref 26.0–34.0)
MCHC: 33.4 g/dL (ref 30.0–36.0)
MCV: 90.2 fL (ref 78.0–100.0)
Platelets: 221 10*3/uL (ref 150–400)
RBC: 3.88 MIL/uL — ABNORMAL LOW (ref 4.22–5.81)
RDW: 15.8 % — ABNORMAL HIGH (ref 11.5–15.5)
WBC: 13.3 10*3/uL — ABNORMAL HIGH (ref 4.0–10.5)

## 2015-03-19 LAB — BASIC METABOLIC PANEL
Anion gap: 7 (ref 5–15)
BUN: 8 mg/dL (ref 6–20)
CO2: 22 mmol/L (ref 22–32)
Calcium: 8.2 mg/dL — ABNORMAL LOW (ref 8.9–10.3)
Chloride: 108 mmol/L (ref 101–111)
Creatinine, Ser: 0.72 mg/dL (ref 0.61–1.24)
GFR calc Af Amer: >60 mL/min (ref >60)
GFR calc non Af Amer: >60 mL/min (ref >60)
Glucose, Bld: 128 mg/dL — ABNORMAL HIGH (ref 65–99)
Potassium: 3.7 mmol/L (ref 3.5–5.1)
Sodium: 137 mmol/L (ref 135–145)

## 2015-03-19 LAB — GLUCOSE, CAPILLARY: Glucose-Capillary: 102 mg/dL — ABNORMAL HIGH (ref 65–99)

## 2015-03-19 LAB — PROTIME-INR
INR: 1.9 — ABNORMAL HIGH (ref 0.00–1.49)
Prothrombin Time: 21.7 seconds — ABNORMAL HIGH (ref 11.6–15.2)

## 2015-03-19 LAB — HIV ANTIBODY (ROUTINE TESTING W REFLEX): HIV Screen 4th Generation wRfx: NONREACTIVE

## 2015-03-19 MED ORDER — WARFARIN SODIUM 5 MG PO TABS
5.0000 mg | ORAL_TABLET | Freq: Every day | ORAL | Status: DC
  Administered 2015-03-19 – 2015-03-20 (×2): 5 mg via ORAL
  Filled 2015-03-19 (×2): qty 1

## 2015-03-19 MED ORDER — WARFARIN - PHARMACIST DOSING INPATIENT
Freq: Every day | Status: DC
  Administered 2015-03-20: 18:00:00

## 2015-03-19 MED ORDER — WARFARIN SODIUM 2.5 MG PO TABS
2.5000 mg | ORAL_TABLET | Freq: Once | ORAL | Status: DC
  Filled 2015-03-19: qty 1

## 2015-03-19 MED ORDER — VANCOMYCIN HCL 10 G IV SOLR
2000.0000 mg | Freq: Two times a day (BID) | INTRAVENOUS | Status: DC
  Administered 2015-03-19 – 2015-03-20 (×5): 2000 mg via INTRAVENOUS
  Filled 2015-03-19 (×6): qty 2000

## 2015-03-19 NOTE — Progress Notes (Signed)
Marc Mills is a 69 y.o. male patient admitted from ED awake, alert - oriented  X 4 - no acute distress noted.  VSS - Blood pressure 123/84, pulse 76, temperature 98.2 F (36.8 C), temperature source Oral, resp. rate 20, height 6' (1.829 m), weight 160.619 kg (354 lb 1.6 oz), SpO2 96 %.    IV in place, occlusive dsg intact without redness.  Orientation to room, and floor completed with information packet given to patient/family.  Patient declined safety video at this time.  Admission INP armband ID verified with patient/family, and in place.   SR up x 2, fall assessment complete, with patient and family able to verbalize understanding of risk associated with falls, and verbalized understanding to call nsg before up out of bed.  Call light within reach, patient able to voice, and demonstrate understanding.  Skin, clean-dry- intact without evidence of bruising, or skin tears.   No evidence of skin break down noted on exam.     Will continue to evaluate and treat per MD orders.  Elon Jester, RN 03/19/2015 1200 AM

## 2015-03-19 NOTE — Progress Notes (Signed)
ANTICOAGULATION CONSULT NOTE - Initial Consult  Pharmacy Consult for Warfarin  Indication: History of DVT/PE  Allergies  Allergen Reactions  . Hydrocodone Itching and Other (See Comments)    Severe dizziness, Gives me chills  . Oxycodone Itching and Other (See Comments)    Severe dizziness, Gives me chills    Patient Measurements: Height: 6' (182.9 cm) Weight: (!) 354 lb 1.6 oz (160.619 kg) IBW/kg (Calculated) : 77.6  Vital Signs: Temp: 98.2 F (36.8 C) (11/13 2353) Temp Source: Oral (11/13 2353) BP: 123/84 mmHg (11/13 2353) Pulse Rate: 76 (11/13 2353)  Labs:  Recent Labs  03/18/15 2043  HGB 12.4*  HCT 36.7*  PLT PLATELET CLUMPS NOTED ON SMEAR, COUNT APPEARS ADEQUATE  LABPROT 21.6*  INR 1.88*  CREATININE 0.74    Estimated Creatinine Clearance: 136.6 mL/min (by C-G formula based on Cr of 0.74).   Medical History: Past Medical History  Diagnosis Date  . Morbid obesity ([place])   . History of pulmonary embolus (PE)   . History of lymphoma   . Incisional hernia   . Arthritis   . Blood transfusion without reported diagnosis   . DVT (deep venous thrombosis) ([place])     2011 right leg  . Anorectal fistula   . OSA on CPAP     cpap  10 yrs  . Varicose veins of lower extremities     pe  . Cancer (HCC)     lymphoma hx  . Cataract   . Cellulitis 04/04/2013    Assessment: Continuing PTA warfarin for hx of DVT/PE, pt is here for chief complaint of cellulitis/fever, INR is 1.88 this evening. Hgb 12.4. Other labs reviewed.   Goal of Therapy:  INR 2-3 Monitor platelets by anticoagulation protocol: Yes   Plan:  -Warfarin 5 mg daily at 1800, with an extra 2.5 mg dose tonight (already had 2.5 mg earlier on 11/13 at outside facility) -Daily PT/INR -Monitor for bleeding  Narda Bonds 03/19/2015,12:03 AM

## 2015-03-19 NOTE — Progress Notes (Signed)
Family Medicine Teaching Service Daily Progress Note Intern Pager: [phone]  Patient name: Marc Mills Medical record number: [id number] Date of birth: January 22, 1946 Age: 69 y.o. Gender: male  Primary Care Provider: Jenny Reichmann, MD Consultants: None Code Status: Full  Pt Overview and Major Events to Date:  11/13: Admitted to [facility] for RLE cellulitis  Assessment and Plan: Marc Mills is a 69 y.o. male presenting with RLE cellulitis . 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  1. RLE cellulitis: Nonpurulent, recurrent (5th episode) at risk with obesity and h/o DVT with likely venous insufficiency. Considered severe based on recent sepsis and high risk with h/o splenectomy for marginal cell lymphoma. Was just recently discharged from [facility] today. Was treated with Vancomycin and discharged on Doxycycline. Was hypotensive at [facility]. While in the ED here, has been afebrile with stable vitals. WBC 14.5 on admission > 13.3 this am. Hb A1c was normal at recent hospitalization.  - Admit to [facility], attending Dr. Gwendlyn Deutscher. - Vanc/Zosyn per pharm. Pt has been in the water recently so there is some concern for Pseudomonas. - Tylenol for pain/fever, Zofran for nausea - Will get blood cultures because none were drawn at [facility] and Pt is immunocompromised s/p splenectomy.  - HIV pending - Cardiac monitoring   2. Hx PE/DVT x 2: On Coumadin. Has an IVC filter since 2001. PT 21.6, INR 1.88 on admission. Venous dopplers were negative for DVT at [facility] on 11/11. - Coumadin per pharm, in the setting of drug interactions with antibiotics.  3. Obstructive Sleep Apnea:  - CPAP  4. Depression:  - Continue home med: Celexa 20mg  daily  FEN/GI: Heart healthy diet, MIVFs at 115ml/hr Prophylaxis: On Coumadin for hx DVT/PE x  2   Disposition: Home pending clinical improvement. Anticipate discharge in 1-3 days.  Subjective:  Pt says he is doing fine this morning. He denies any fevers, chills, nausea, or vomiting. He doesn't feel like the cellulitis has improved at all overnight. He has no questions or concerns this morning.  Objective: Temp:  [98 F (36.7 C)-99.4 F (37.4 C)] 98 F (36.7 C) (11/14 0451) Pulse Rate:  [76-90] 89 (11/14 0451) Resp:  [15-20] 18 (11/14 0451) BP: (103-139)/(65-92) 130/75 mmHg (11/14 0451) SpO2:  [92 %-100 %] 100 % (11/14 0451) Weight:  [354 lb 1.6 oz (160.619 kg)-357 lb 9.6 oz (162.206 kg)] 354 lb 1.6 oz (160.619 kg) (11/13 2353) Physical Exam: General: Well-appearing male, pleasant and conversational, in NAD, sitting up on the side of the bed HEENT: [place]/AT, EOMI, no scleral icterus, MMM Neck: Supple Cardiovascular: RRR, no murmurs, 2+ DP pulses bilaterally Respiratory: CTAB, no wheezes Abdomen: +BS, soft, non-tender, non-distended MSK: 2+ pitting edema to the knee on the right, 1+ mild pitting edema on the left Skin: RLE is warm to the touch, erythema taking up almost all of his lower leg, unchanged from previous exam. Neuro: Awake, alert, oriented; CN 2-12 grossly intact; no focal deficits Psych: Appropriate mood and affect.  Laboratory:  Recent Labs Lab 03/18/15 2043 03/19/15 0500  WBC 14.5* 13.3*  HGB 12.4* 11.7*  HCT 36.7* 35.0*  PLT PLATELET CLUMPS NOTED ON SMEAR, COUNT APPEARS ADEQUATE 221    Recent Labs Lab 03/18/15 2043 03/19/15 0500  NA 135 137  K 4.9 3.7  CL 108 108  CO2 20* 22  BUN 9 8  CREATININE 0.74 0.72  CALCIUM 8.2* 8.2*  PROT 6.3*  --   BILITOT 1.1  --   ALKPHOS 60  --   ALT 27  --   AST 40  --   GLUCOSE 107* 128*   PT 21.6, INR 1.88  Imaging/Diagnostic Tests: None  Sela Hua, MD 03/19/2015, 7:10 AM PGY-1, [place] Intern pager: [phone], text pages welcome

## 2015-03-19 NOTE — Care Management Note (Signed)
Case Management Note  Patient Details  Name: Marc Mills MRN: [id number] Date of Birth: May 10, 1945  Subjective/Objective:    Date: 03/19/15 Spoke with patient at the bedside.  Introduced self as Tourist information centre manager and explained role in discharge planning and how to be reached.  Verified patient lives in town, with spouse, he has a cpap at home that he uses.  Expressed potential need for no other DME.  Verified patient anticipates to go home with family, at time of discharge and will have Full-time supervision by family if needed at this time to best of their knowledge.  Patient denied needing help with their medication.  Patient drives to MD appointments.  Verified patient has PCP Nena Jordan.   Plan: CM will continue to follow for discharge planning and [facility] resources.                 Action/Plan:   Expected Discharge Date:                  Expected Discharge Plan:  Home/Self Care  In-House Referral:     Discharge planning Services     Post Acute Care Choice:    Choice offered to:     DME Arranged:    DME Agency:     HH Arranged:    [place] Agency:     Status of Service:  Completed, signed off  Medicare Important Message Given:    Date Medicare IM Given:    Medicare IM give by:    Date Additional Medicare IM Given:    Additional Medicare Important Message give by:     If discussed at [place] of Stay Meetings, dates discussed:    Additional Comments:  Zenon Mayo, RN 03/19/2015, 2:50 PM

## 2015-03-19 NOTE — Progress Notes (Signed)
RT Npte:  Patient has home CPAP and doesn't need any assistance with applying. RT will continue to monitor.

## 2015-03-20 LAB — BASIC METABOLIC PANEL
Anion gap: 9 (ref 5–15)
BUN: 7 mg/dL (ref 6–20)
CO2: 25 mmol/L (ref 22–32)
Calcium: 8.3 mg/dL — ABNORMAL LOW (ref 8.9–10.3)
Chloride: 106 mmol/L (ref 101–111)
Creatinine, Ser: 0.83 mg/dL (ref 0.61–1.24)
GFR calc Af Amer: >60 mL/min (ref >60)
GFR calc non Af Amer: >60 mL/min (ref >60)
Glucose, Bld: 107 mg/dL — ABNORMAL HIGH (ref 65–99)
Potassium: 3.5 mmol/L (ref 3.5–5.1)
Sodium: 140 mmol/L (ref 135–145)

## 2015-03-20 LAB — HEMOGLOBIN A1C
Hgb A1c MFr Bld: 5.8 % — ABNORMAL HIGH (ref 4.8–5.6)
Mean Plasma Glucose: 120 mg/dL

## 2015-03-20 LAB — CBC
HCT: 33.7 % — ABNORMAL LOW (ref 39.0–52.0)
Hemoglobin: 11.5 g/dL — ABNORMAL LOW (ref 13.0–17.0)
MCH: 30.7 pg (ref 26.0–34.0)
MCHC: 34.1 g/dL (ref 30.0–36.0)
MCV: 90.1 fL (ref 78.0–100.0)
Platelets: 230 10*3/uL (ref 150–400)
RBC: 3.74 MIL/uL — ABNORMAL LOW (ref 4.22–5.81)
RDW: 15.7 % — ABNORMAL HIGH (ref 11.5–15.5)
WBC: 13 10*3/uL — ABNORMAL HIGH (ref 4.0–10.5)

## 2015-03-20 LAB — PROTIME-INR
INR: 1.92 — ABNORMAL HIGH (ref 0.00–1.49)
Prothrombin Time: 21.9 seconds — ABNORMAL HIGH (ref 11.6–15.2)

## 2015-03-20 NOTE — Progress Notes (Signed)
ANTICOAGULATION CONSULT NOTE - Initial Consult  Pharmacy Consult for Warfarin  Indication: History of DVT/PE  Allergies  Allergen Reactions  . Hydrocodone Itching and Other (See Comments)    Severe dizziness, Gives me chills  . Oxycodone Itching and Other (See Comments)    Severe dizziness, Gives me chills    Patient Measurements: Height: 6' (182.9 cm) Weight: (!) 354 lb 1.6 oz (160.619 kg) IBW/kg (Calculated) : 77.6  Vital Signs: Temp: 98.2 F (36.8 C) (11/15 0609) Temp Source: Oral (11/15 0609) BP: 126/61 mmHg (11/15 0609) Pulse Rate: 79 (11/15 0609)  Labs:  Recent Labs  03/18/15 2043 03/19/15 0009 03/19/15 0500 03/20/15 0635 03/20/15 1137  HGB 12.4*  --  11.7* 11.5*  --   HCT 36.7*  --  35.0* 33.7*  --   PLT PLATELET CLUMPS NOTED ON SMEAR, COUNT APPEARS ADEQUATE  --  221 230  --   LABPROT 21.6* 21.7*  --   --  21.9*  INR 1.88* 1.90*  --   --  1.92*  CREATININE 0.74  --  0.72 0.83  --     Estimated Creatinine Clearance: 131.6 mL/min (by C-G formula based on Cr of 0.83).   Medical History: Past Medical History  Diagnosis Date  . Morbid obesity ([place])   . History of pulmonary embolus (PE)   . History of lymphoma   . Incisional hernia   . Arthritis   . Blood transfusion without reported diagnosis   . DVT (deep venous thrombosis) ([place])     2011 right leg  . Anorectal fistula   . OSA on CPAP     cpap  10 yrs  . Varicose veins of lower extremities     pe  . Cancer (HCC)     lymphoma hx  . Cataract   . Cellulitis 04/04/2013    Assessment: 42 YOM admitted for RLE cellulitis, continuing PTA warfarin for hx of DVT/PE, INR is 1.92, slightly subtherapeutic. CBC stable, No bleeding noted per chart.   Goal of Therapy:  INR 2-3 Monitor platelets by anticoagulation protocol: Yes   Plan:  -Warfarin 5 mg daily at 1800, -Daily PT/INR -Monitor for bleeding  Manley Mason 03/20/2015,12:38 PM

## 2015-03-20 NOTE — Progress Notes (Signed)
Patient has his home CPAP beside and ready for use.  Patient said he did not need any help from Respiratory that he would place on himself when ready for bed.

## 2015-03-20 NOTE — Progress Notes (Signed)
MD paged to clarify fluid orders. Fluids running 150cc an hour. MD stated they will evaluate order.

## 2015-03-20 NOTE — Discharge Instructions (Addendum)
You were hospitalized for cellulitis, which is a bacterial infection of the skin. We treated you with IV Vancomycin and Zosyn for about 48 hours. We then switched you to Doxycycline. Please take this twice a day until the medication runs out. One of the most important things you can do to help the cellulitis improve is to keep you leg elevated above your heart as much as you can. Please try to keep your leg moist, using Vasoline.   You had a very small injury to your kidneys from taking the Vancomycin. This kidney injury commonly happens to people taking this medication. It is very important that you remain hydrated and avoid any NSAIDs like Ibuprofen or Naproxen until your kidney level can be re-checked by your primary care doctor.  Please schedule an appointment with your primary care doctor to be seen as soon as possible.  It was a pleasure meeting you!    Information on my medicine - Coumadin   (Warfarin)  This medication education was reviewed with me or my healthcare representative as part of my discharge preparation.  Why was Coumadin prescribed for you? Coumadin was prescribed for you because you have a blood clot or a medical condition that can cause an increased risk of forming blood clots. Blood clots can cause serious health problems by blocking the flow of blood to the heart, lung, or brain. Coumadin can prevent harmful blood clots from forming. As a reminder your indication for Coumadin is: history of Deep Vein Thrombosis Treatment  What test will check on my response to Coumadin? While on Coumadin (warfarin) you will need to have an INR test regularly to ensure that your dose is keeping you in the desired range. The INR (international normalized ratio) number is calculated from the result of the laboratory test called prothrombin time (PT).  If an INR APPOINTMENT HAS NOT ALREADY BEEN MADE FOR YOU please schedule an appointment to have this lab work done by your health care  provider within 7 days. Your INR goal is usually a number between:  2 to 3   What  do you need to  know  About  COUMADIN? Take Coumadin (warfarin) exactly as prescribed by your healthcare provider about the same time each day.  DO NOT stop taking without talking to the doctor who prescribed the medication.  Stopping without other blood clot prevention medication to take the place of Coumadin may increase your risk of developing a new clot or stroke.  Get refills before you run out.  What do you do if you miss a dose? If you miss a dose, take it as soon as you remember on the same day then continue your regularly scheduled regimen the next day.  Do not take two doses of Coumadin at the same time.  Important Safety Information A possible side effect of Coumadin (Warfarin) is an increased risk of bleeding. You should call your healthcare provider right away if you experience any of the following: ? Bleeding from an injury or your nose that does not stop. ? Unusual colored urine (red or dark brown) or unusual colored stools (red or black). ? Unusual bruising for unknown reasons. ? A serious fall or if you hit your head (even if there is no bleeding).  Some foods or medicines interact with Coumadin (warfarin) and might alter your response to warfarin. To help avoid this: ? Eat a balanced diet, maintaining a consistent amount of Vitamin K. ? Notify your provider about major diet changes  you plan to make. ? Avoid alcohol or limit your intake to 1 drink for women and 2 drinks for men per day. (1 drink is 5 oz. wine, 12 oz. beer, or 1.5 oz. liquor.)  Make sure that ANY health care provider who prescribes medication for you knows that you are taking Coumadin (warfarin).  Also make sure the healthcare provider who is monitoring your Coumadin knows when you have started a new medication including herbals and non-prescription products.  Coumadin (Warfarin)  Major Drug Interactions  Increased Warfarin  Effect Decreased Warfarin Effect  Alcohol (large quantities) Antibiotics (esp. Septra/Bactrim, Flagyl, Cipro) Amiodarone (Cordarone) Aspirin (ASA) Cimetidine (Tagamet) Megestrol (Megace) NSAIDs (ibuprofen, naproxen, etc.) Piroxicam (Feldene) Propafenone (Rythmol SR) Propranolol (Inderal) Isoniazid (INH) Posaconazole (Noxafil) Barbiturates (Phenobarbital) Carbamazepine (Tegretol) Chlordiazepoxide (Librium) Cholestyramine (Questran) Griseofulvin Oral Contraceptives Rifampin Sucralfate (Carafate) Vitamin K   Coumadin (Warfarin) Major Herbal Interactions  Increased Warfarin Effect Decreased Warfarin Effect  Garlic Ginseng Ginkgo biloba Coenzyme Q10 Green tea St. Johns wort    Coumadin (Warfarin) FOOD Interactions  Eat a consistent number of servings per week of foods HIGH in Vitamin K (1 serving =  cup)  Collards (cooked, or boiled & drained) Kale (cooked, or boiled & drained) Mustard greens (cooked, or boiled & drained) Parsley *serving size only =  cup Spinach (cooked, or boiled & drained) Swiss chard (cooked, or boiled & drained) Turnip greens (cooked, or boiled & drained)  Eat a consistent number of servings per week of foods MEDIUM-HIGH in Vitamin K (1 serving = 1 cup)  Asparagus (cooked, or boiled & drained) Broccoli (cooked, boiled & drained, or raw & chopped) Brussel sprouts (cooked, or boiled & drained) *serving size only =  cup Lettuce, raw (green leaf, endive, romaine) Spinach, raw Turnip greens, raw & chopped   These websites have more information on Coumadin (warfarin):  FailFactory.se; VeganReport.com.au;

## 2015-03-20 NOTE — Progress Notes (Signed)
Family Medicine Teaching Service Daily Progress Note Intern Pager: [phone]  Patient name: Marc Mills Medical record number: [id number] Date of birth: Nov 13, 1945 Age: 69 y.o. Gender: male  Primary Care Provider: Jenny Reichmann, MD Consultants: None Code Status: Full  Pt Overview and Major Events to Date:  11/13: Admitted to [facility] for RLE cellulitis  Assessment and Plan: Marc Mills is a 69 y.o. male presenting with RLE cellulitis . 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  1. RLE cellulitis: Nonpurulent, recurrent (5th episode). Considered severe based on recent sepsis and high risk with h/o splenectomy for marginal cell lymphoma. Per Pt, was hypotensive at [facility]. Had a low grade temp last night to 100.4. Also had a low BP to 98/49, improved to 126/61 this am. WBC 14.5 on admission > 13.3 > 13.0 this am. On exam, cellulitis has regressed since admission. - Vanc/Zosyn per pharm. Pt has been in the water recently so there is some concern for Pseudomonas. Will continue these for another 24 hours. May consider switching to po antibiotics tomorrow. - Keeping legs elevated as much as possible throughout the day. - Tylenol for pain/fever, Zofran for nausea - Blood cultures are pending - HIV negative. - Cardiac monitoring   2. Hx PE/DVT x 2: On Coumadin. Has an IVC filter since 2001. PT 21.6, INR 1.88 on admission. Venous dopplers were negative for DVT at [facility] on 11/11. - Coumadin per pharm, in the setting of drug interactions with antibiotics.  3. Obstructive Sleep Apnea:  - CPAP  4. Depression:  - Continue home med: Celexa 20mg  daily  FEN/GI: Heart healthy diet, MIVFs at 184ml/hr Prophylaxis: On Coumadin for hx DVT/PE x 2  Disposition: Home pending clinical improvement. Anticipate discharge in 1-3 days.  Subjective:  Pt states he is  doing well this morning. He has been keeping his legs elevated as much as possible. No fevers, no chills, no nausea, no vomiting.   Objective: Temp:  [98.2 F (36.8 C)-100.4 F (38 C)] 98.2 F (36.8 C) (11/15 0609) Pulse Rate:  [79-87] 79 (11/15 0609) Resp:  [18-20] 18 (11/15 0609) BP: (98-127)/(49-61) 126/61 mmHg (11/15 0609) SpO2:  [93 %-97 %] 93 % (11/15 [id number]) Physical Exam: General: Well-appearing male, pleasant and conversational, in NAD, laying in bed with his legs elevated HEENT: [place]/AT, EOMI, no scleral icterus, MMM Neck: Supple Cardiovascular: RRR, no murmurs, 2+ DP pulses bilaterally Respiratory: CTAB, no wheezes Abdomen: +BS, soft, non-tender, non-distended MSK: 2+ pitting edema to the knee on the right, 1+ mild pitting edema on the left Skin: RLE is warm to the touch, erythema has regressed from previous exam. Neuro: Awake, alert, oriented; CN 2-12 grossly intact; no focal deficits Psych: Appropriate mood and affect.  Laboratory:  Recent Labs Lab 03/18/15 2043 03/19/15 0500  WBC 14.5* 13.3*  HGB 12.4* 11.7*  HCT 36.7* 35.0*  PLT PLATELET CLUMPS NOTED ON SMEAR, COUNT APPEARS ADEQUATE 221    Recent Labs Lab 03/18/15 2043 03/19/15 0500  NA 135 137  K 4.9 3.7  CL 108 108  CO2 20* 22  BUN 9 8  CREATININE 0.74 0.72  CALCIUM 8.2* 8.2*  PROT 6.3*  --   BILITOT 1.1  --   ALKPHOS 60  --   ALT 27  --   AST 40  --   GLUCOSE 107* 128*   PT 21.6, INR 1.88  Imaging/Diagnostic Tests: None  Sela Hua, MD 03/20/2015, 7:25 AM PGY-1, [place] Intern pager: [phone], text pages welcome

## 2015-03-21 LAB — CBC
HCT: 31.4 % — ABNORMAL LOW (ref 39.0–52.0)
Hemoglobin: 10.7 g/dL — ABNORMAL LOW (ref 13.0–17.0)
MCH: 30.6 pg (ref 26.0–34.0)
MCHC: 34.1 g/dL (ref 30.0–36.0)
MCV: 89.7 fL (ref 78.0–100.0)
Platelets: 271 10*3/uL (ref 150–400)
RBC: 3.5 MIL/uL — ABNORMAL LOW (ref 4.22–5.81)
RDW: 15.8 % — ABNORMAL HIGH (ref 11.5–15.5)
WBC: 11.6 10*3/uL — ABNORMAL HIGH (ref 4.0–10.5)

## 2015-03-21 LAB — BASIC METABOLIC PANEL
Anion gap: 9 (ref 5–15)
BUN: 11 mg/dL (ref 6–20)
CO2: 26 mmol/L (ref 22–32)
Calcium: 8.3 mg/dL — ABNORMAL LOW (ref 8.9–10.3)
Chloride: 104 mmol/L (ref 101–111)
Creatinine, Ser: 1.49 mg/dL — ABNORMAL HIGH (ref 0.61–1.24)
GFR calc Af Amer: 53 mL/min — ABNORMAL LOW (ref >60)
GFR calc non Af Amer: 46 mL/min — ABNORMAL LOW (ref >60)
Glucose, Bld: 126 mg/dL — ABNORMAL HIGH (ref 65–99)
Potassium: 3.4 mmol/L — ABNORMAL LOW (ref 3.5–5.1)
Sodium: 139 mmol/L (ref 135–145)

## 2015-03-21 LAB — PROTIME-INR
INR: 1.89 — ABNORMAL HIGH (ref 0.00–1.49)
Prothrombin Time: 21.7 seconds — ABNORMAL HIGH (ref 11.6–15.2)

## 2015-03-21 MED ORDER — DOXYCYCLINE HYCLATE 100 MG PO TABS: 100.0000 mg | ORAL_TABLET | Freq: Two times a day (BID) | ORAL | Status: DC

## 2015-03-21 MED ORDER — WARFARIN SODIUM 7.5 MG PO TABS: 7.5000 mg | ORAL_TABLET | Freq: Once | ORAL | Status: DC

## 2015-03-21 MED ORDER — WARFARIN SODIUM 5 MG PO TABS: 5.0000 mg | ORAL_TABLET | Freq: Every day | ORAL | Status: DC

## 2015-03-21 NOTE — Progress Notes (Signed)
ANTICOAGULATION CONSULT NOTE - Initial Consult  Pharmacy Consult for Warfarin  Indication: History of DVT/PE  Allergies  Allergen Reactions  . Hydrocodone Itching and Other (See Comments)    Severe dizziness, Gives me chills  . Oxycodone Itching and Other (See Comments)    Severe dizziness, Gives me chills    Patient Measurements: Height: 6' (182.9 cm) Weight: (!) 354 lb 1.6 oz (160.619 kg) IBW/kg (Calculated) : 77.6  Vital Signs: Temp: 97.4 F (36.3 C) (11/16 0550) Temp Source: Oral (11/16 0550) BP: 132/71 mmHg (11/16 0550) Pulse Rate: 77 (11/16 0550)  Labs:  Recent Labs  03/19/15 0009 03/19/15 0500 03/20/15 0635 03/20/15 1137 03/21/15 0613  HGB  --  11.7* 11.5*  --  10.7*  HCT  --  35.0* 33.7*  --  31.4*  PLT  --  221 230  --  271  LABPROT 21.7*  --   --  21.9* 21.7*  INR 1.90*  --   --  1.92* 1.89*  CREATININE  --  0.72 0.83  --  1.49*    Estimated Creatinine Clearance: 73.3 mL/min (by C-G formula based on Cr of 1.49).   Medical History: Past Medical History  Diagnosis Date  . Morbid obesity ([place])   . History of pulmonary embolus (PE)   . History of lymphoma   . Incisional hernia   . Arthritis   . Blood transfusion without reported diagnosis   . DVT (deep venous thrombosis) ([place])     2011 right leg  . Anorectal fistula   . OSA on CPAP     cpap  10 yrs  . Varicose veins of lower extremities     pe  . Cancer (HCC)     lymphoma hx  . Cataract   . Cellulitis 04/04/2013    Assessment: 43 YOM admitted for RLE cellulitis, continuing PTA warfarin for hx of DVT/PE, INR is 1.89, remains slightly subtherapeutic on home dose. CBC stable, No bleeding noted per chart. Vancomycin switched to doxycycline for cellulitis which might enhance the coumadin effect.  PTA dose 5mg  daily  Goal of Therapy:  INR 2-3 Monitor platelets by anticoagulation protocol: Yes   Plan:  -Warfarin 7.5 mg today, then continue home dose 5mg  daily from tomorrow -Daily  PT/INR -Monitor for bleeding  Maryanna Shape, PharmD, BCPS  Clinical Pharmacist  Pager: [phone]   03/21/2015,10:47 AM

## 2015-03-21 NOTE — Discharge Summary (Signed)
[facility] Discharge Summary  Patient name: Marc Mills Medical record number: [id number] Date of birth: 1945/06/08 Age: 69 y.o. Gender: male Date of Admission: 03/18/2015  Date of Discharge: 03/21/15 Admitting Physician: Kinnie Feil, MD  Primary Care Provider: Jenny Reichmann, MD Consultants: None  Indication for Hospitalization: RLE cellulitis  Discharge Diagnoses/Problem List:  RLE cellulitis Hx PE and DVT  OSA Morbid obesity Depression  Disposition: Home  Discharge Condition: Stable, improved  Discharge Exam:  General: Well-appearing male, pleasant and conversational, in NAD, laying in bed with his legs elevated HEENT: [place]/AT, EOMI, no scleral icterus, MMM Neck: Supple Cardiovascular: RRR, no murmurs, 2+ DP pulses bilaterally Respiratory: CTAB, no wheezes Abdomen: +BS, soft, non-tender, non-distended MSK: 2+ pitting edema to the knee on the right, 1+ mild pitting edema on the left Skin: RLE is warm to the touch, erythema has regressed from previous exam. Neuro: Awake, alert, oriented; CN 2-12 grossly intact; no focal deficits Psych: Appropriate mood and affect.  Brief Hospital Course:  Marc Mills is a 69 year old male who presented to the ED with RLE cellulitis. He had previously been admitted at the hospital in [facility] and discharged. However, he returned to our ED because he began to feel sick again. In the ED, he was afebrile with stable vitals. WBC count was 14.5. We started him on Vancomycin and Zosyn. He was given Tylenol for pain and Zofran for nausea. We obtained blood cultures because none were drawn in [place] and Marc Mills is immunocompromised after splenectomy. The blood cultures showed no growth. His right leg was noticeably larger than the left on exam, but we did not get doppler ultrasounds of his lower extremities to rule out DVT because they had just been performed in [place] and were  negative. He was treated with IV Vanc and Zosyn for about 2.5 days and was then transitioned to PO Doxycycline. He was encouraged to keep his leg elevated as much as possible throughout his admission. His cellulitis had regressed. On the day of discharge, it was noted that his Cr bumped from 0.83 to 1.49. We thought this was likely due to Vancomycin. All of his other chronic medical problems were stable and no changes were made to his regular medications.  Issues for Follow Up:  1. Marc Mills had a bump in creatinine on the day of discharge from 0.83 to 1.49. We thought this was likely secondary to Vanc/Zosyn administration. We recommend repeat BMET as an outpatient to ensure that his creatinine had returned to baseline. 2. Our plan was to discharge him home on Doxycycline. However, he mentioned that he already had a 10 day prescription of doxycycline that had been called in by his PCP and did not need a new prescription. He was discharged home without a prescription from [place]. 3. Marc Mills INR was slightly subtherapeutic at 1.89 on the day of discharge. We recommend continued close monitoring and adjustment of his dose as needed.  Significant Procedures: None  Significant Labs and Imaging:   Recent Labs Lab 03/19/15 0500 03/20/15 0635 03/21/15 0613  WBC 13.3* 13.0* 11.6*  HGB 11.7* 11.5* 10.7*  HCT 35.0* 33.7* 31.4*  PLT 221 230 271    Recent Labs Lab 03/18/15 2043 03/19/15 0500 03/20/15 0635 03/21/15 0613  NA 135 137 140 139  K 4.9 3.7 3.5 3.4*  CL 108 108 106 104  CO2 20* 22 25 26   GLUCOSE 107* 128* 107* 126*  BUN 9 8 7  11  CREATININE 0.74 0.72 0.83 1.49*  CALCIUM 8.2* 8.2* 8.3* 8.3*  ALKPHOS 60  --   --   --   AST 40  --   --   --   ALT 27  --   --   --   ALBUMIN 3.1*  --   --   --    Blood cultures: no growth PT: 21.7, INR 1.89 HIV negative Hemoglobin A1c: 5.8%   Results/Tests Pending at Time of Discharge: None  Discharge Medications:    Medication List     TAKE these medications        acetaminophen 500 MG tablet  Commonly known as:  TYLENOL  Take 1,000 mg by mouth every 6 (six) hours as needed for mild pain.     b complex vitamins tablet  Take 1 tablet by mouth every morning.     calcium carbonate 600 MG Tabs tablet  Commonly known as:  OS-CAL  Take 600 mg by mouth every morning.     cholecalciferol 1000 UNITS tablet  Commonly known as:  VITAMIN D  Take 1,000 Units by mouth every morning.     citalopram 20 MG tablet  Commonly known as:  CELEXA  Take 1 tablet (20 mg total) by mouth daily.     Magnesium 400 MG Caps  Take 400 mg by mouth daily.     Salmon Oil Caps  Take 1 capsule by mouth daily.     sildenafil 100 MG tablet  Commonly known as:  VIAGRA  Take 1 tablet (100 mg total) by mouth daily as needed for erectile dysfunction. Splits the tablet in half prn     THERAGRAN PO  Take 1 tablet by mouth daily.     warfarin 5 MG tablet  Commonly known as:  COUMADIN  Take 1 tablet (5 mg total) by mouth 1 day or 1 dose. As directed        Discharge Instructions: Please refer to Patient Instructions section of EMR for full details.  Patient was counseled important signs and symptoms that should prompt return to medical care, changes in medications, dietary instructions, activity restrictions, and follow up appointments.   Follow-Up Appointments:   Sela Hua, MD 03/21/2015, 7:03 PM PGY-1, [place]

## 2015-03-21 NOTE — Progress Notes (Signed)
Family Medicine Teaching Service Daily Progress Note Intern Pager: [phone]  Patient name: Marc Mills Medical record number: [id number] Date of birth: [birth year] Age: 69 y.o. Gender: male  Primary Care Provider: Jenny Reichmann, MD Consultants: None Code Status: Full  Pt Overview and Major Events to Date:  11/13: Admitted to [facility] for RLE cellulitis  Assessment and Plan: Marc Mills is a 69 y.o. male presenting with RLE cellulitis . 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  1. RLE cellulitis: Nonpurulent, recurrent (5th episode). Considered severe based on recent sepsis and high risk with h/o splenectomy for marginal cell lymphoma. WBC trending down: 14.5 > 11.6 this am. Afebrile overnight. On exam, cellulitis appears improved from yesterday. - Vanc/Zosyn per pharm. Will transition to PO Doxycycline today, as Pt states this antibiotic has always worked in the past. - Keeping legs elevated as much as possible throughout the day. - Tylenol for pain/fever, Zofran for nausea - Blood cultures show NG x 1 day - HIV negative. - Will d/c cardiac monitoring today.  2. Hx PE/DVT x 2: On Coumadin. Has an IVC filter since 2001. PT 21.6, INR 1.88 on admission. Venous dopplers were negative for DVT at [facility] on 11/11. - Coumadin per pharm, in the setting of drug interactions with antibiotics.  3. Obstructive Sleep Apnea:  - CPAP  4. Depression: Stable. - Continue home med: Celexa 20mg  daily  FEN/GI: Heart healthy diet, SLIV. Prophylaxis: On Coumadin for hx DVT/PE x 2  Disposition: Home either today or tomorrow.  Subjective:  Pt states he is doing well today. He feels like his cellulitis has taken a turn for the better. He would like to go home today, if possible  Objective: Temp:  [97.4 F (36.3 C)-98.8 F (37.1 C)] 97.4 F (36.3 C) (11/16 0550) Pulse Rate:   [77-95] 77 (11/16 0550) Resp:  [20] 20 (11/16 0550) BP: (132-141)/(60-79) 132/71 mmHg (11/16 0550) SpO2:  [90 %-93 %] 93 % (11/16 0550) Physical Exam: General: Well-appearing male, pleasant and conversational, in NAD, laying in bed with his legs elevated HEENT: [place]/AT, EOMI, no scleral icterus, MMM Neck: Supple Cardiovascular: RRR, no murmurs, 2+ DP pulses bilaterally Respiratory: CTAB, no wheezes Abdomen: +BS, soft, non-tender, non-distended MSK: 2+ pitting edema to the knee on the right, 1+ mild pitting edema on the left Skin: RLE is warm to the touch, erythema has regressed from previous exam. Neuro: Awake, alert, oriented; CN 2-12 grossly intact; no focal deficits Psych: Appropriate mood and affect.  Laboratory:  Recent Labs Lab 03/18/15 2043 03/19/15 0500 03/20/15 0635  WBC 14.5* 13.3* 13.0*  HGB 12.4* 11.7* 11.5*  HCT 36.7* 35.0* 33.7*  PLT PLATELET CLUMPS NOTED ON SMEAR, COUNT APPEARS ADEQUATE 221 230    Recent Labs Lab 03/18/15 2043 03/19/15 0500 03/20/15 0635  NA 135 137 140  K 4.9 3.7 3.5  CL 108 108 106  CO2 20* 22 25  BUN 9 8 7   CREATININE 0.74 0.72 0.83  CALCIUM 8.2* 8.2* 8.3*  PROT 6.3*  --   --   BILITOT 1.1  --   --   ALKPHOS 60  --   --   ALT 27  --   --   AST 40  --   --   GLUCOSE 107* 128* 107*   PT 21.6, INR 1.88  Imaging/Diagnostic Tests: None  Sela Hua, MD 03/21/2015, 6:52 AM PGY-1, [facility]  [place] Intern pager: [phone], text pages welcome

## 2015-03-21 NOTE — Care Management Note (Signed)
Case Management Note  Patient Details  Name: PRINCEMICHAEL GRAVIER MRN: [id number] Date of Birth: 03/25/1946  Subjective/Objective:      Patient is for dc today, no needs.              Action/Plan:   Expected Discharge Date:                  Expected Discharge Plan:  Home/Self Care  In-House Referral:     Discharge planning Services     Post Acute Care Choice:    Choice offered to:     DME Arranged:    DME Agency:     HH Arranged:    [place] Agency:     Status of Service:  Completed, signed off  Medicare Important Message Given:    Date Medicare IM Given:    Medicare IM give by:    Date Additional Medicare IM Given:    Additional Medicare Important Message give by:     If discussed at [place] of Stay Meetings, dates discussed:    Additional Comments:  Zenon Mayo, RN 03/21/2015, 1:52 PM

## 2015-03-21 NOTE — Care Management Important Message (Signed)
Important Message  Patient Details  Name: Marc Mills MRN: [id number] Date of Birth: 1945/07/25   Medicare Important Message Given:  Yes    Nathen May 03/21/2015, 3:37 PM

## 2015-03-21 NOTE — Progress Notes (Signed)
Discharged via wheelchair with no s/s of distress. Able to transfer to and from wheelchair without assistance. Wife present at discharge.

## 2015-03-24 LAB — CULTURE, BLOOD (ROUTINE X 2)
Culture: NO GROWTH
Culture: NO GROWTH

## 2015-03-28 ENCOUNTER — Ambulatory Visit (INDEPENDENT_AMBULATORY_CARE_PROVIDER_SITE_OTHER): Payer: Medicare Other | Admitting: Emergency Medicine

## 2015-03-28 VITALS — BP 142/80 | HR 68 | Temp 97.8°F | Resp 18 | Ht 72.0 in | Wt 342.0 lb

## 2015-03-28 DIAGNOSIS — R300 Dysuria: Secondary | ICD-10-CM

## 2015-03-28 DIAGNOSIS — L03115 Cellulitis of right lower limb: Secondary | ICD-10-CM | POA: Diagnosis not present

## 2015-03-28 DIAGNOSIS — R350 Frequency of micturition: Secondary | ICD-10-CM | POA: Diagnosis not present

## 2015-03-28 DIAGNOSIS — H6123 Impacted cerumen, bilateral: Secondary | ICD-10-CM | POA: Diagnosis not present

## 2015-03-28 LAB — POCT CBC
Granulocyte percent: 71.9 %G (ref 37–80)
HCT, POC: 38.5 % — AB (ref 43.5–53.7)
Hemoglobin: 12.8 g/dL — AB (ref 14.1–18.1)
Lymph, poc: 1.7 (ref 0.6–3.4)
MCH, POC: 30 pg (ref 27–31.2)
MCHC: 33.2 g/dL (ref 31.8–35.4)
MCV: 90.3 fL (ref 80–97)
MID (cbc): 1 — AB (ref 0–0.9)
MPV: 8.6 fL (ref 0–99.8)
POC Granulocyte: 6.8 (ref 2–6.9)
POC LYMPH PERCENT: 17.9 %L (ref 10–50)
POC MID %: 10.2 %M (ref 0–12)
Platelet Count, POC: 547 10*3/uL — AB (ref 142–424)
RBC: 4.26 M/uL — AB (ref 4.69–6.13)
RDW, POC: 14.9 %
WBC: 9.5 10*3/uL (ref 4.6–10.2)

## 2015-03-28 LAB — BASIC METABOLIC PANEL WITH GFR
BUN: 28 mg/dL — ABNORMAL HIGH (ref 7–25)
CO2: 27 mmol/L (ref 20–31)
Calcium: 9.7 mg/dL (ref 8.6–10.3)
Chloride: 97 mmol/L — ABNORMAL LOW (ref 98–110)
Creat: 1.21 mg/dL (ref 0.70–1.25)
GFR, Est African American: 70 mL/min (ref >=60)
GFR, Est Non African American: 61 mL/min (ref >=60)
Glucose, Bld: 87 mg/dL (ref 65–99)
Potassium: 5.2 mmol/L (ref 3.5–5.3)
Sodium: 137 mmol/L (ref 135–146)

## 2015-03-28 LAB — POCT URINALYSIS DIP (MANUAL ENTRY)
Bilirubin, UA: NEGATIVE
Glucose, UA: NEGATIVE
Ketones, POC UA: NEGATIVE
Leukocytes, UA: NEGATIVE
Nitrite, UA: NEGATIVE
Protein Ur, POC: NEGATIVE
Spec Grav, UA: 1.01
Urobilinogen, UA: 0.2
pH, UA: 6.5

## 2015-03-28 LAB — POC MICROSCOPIC URINALYSIS (UMFC): Mucus: ABSENT

## 2015-03-28 LAB — GLUCOSE, POCT (MANUAL RESULT ENTRY): POC Glucose: 109 mg/dl — AB (ref 70–99)

## 2015-03-28 MED ORDER — DOXYCYCLINE HYCLATE 100 MG PO CAPS: 100.0000 mg | ORAL_CAPSULE | Freq: Two times a day (BID) | ORAL | Status: DC

## 2015-03-28 NOTE — Patient Instructions (Signed)

## 2015-03-28 NOTE — Progress Notes (Signed)
Patient ID: Marc Mills, male   DOB: Sep 20, 1945, 69 y.o.   MRN: [id number]     This chart was scribed for Marc Queen, MD by Zola Button, Medical Scribe. This patient was seen in room 2 and the patient's care was started at 9:03 AM.   Chief Complaint:  Chief Complaint  Patient presents with  . Follow-up    Cellulitis of right lower extremity    HPI: KYCE GING is a 69 y.o. male with a history of splenic marginal zone b-cell lymphoma, DVT, PE, and varicose veins who reports to [facility] today for a follow-up for cellulitis of his right lower leg. Patient was admitted to [facility] from 11/13 - 11/16 and was admitted to [facility] in [place] prior to that. He did not have a repeat Doppler US at [facility] as his Doppler was negative in [place]. He still has significant swelling and redness, but notes this has improved. He is on doxycycline which has been helping. His appetite has also improved; he has been eating better. Spouse notes that he has had some urinary frequency, urinating every hour. Patient was told he was allowed to travel, so he recently went to [place] to see family; he kept his leg propped up in the backseat while traveling. His last episode of cellulitis was about a year ago. There was some concern for kidney damage at [facility] as his creatinine had increased while admitted; this was thought to be due to Vancomycin/Zosyn administration.  Patient has also had some decreased hearing and would like to have his ears examined for wax.  Past Medical History  Diagnosis Date  . Morbid obesity ([place])   . History of pulmonary embolus (PE)   . History of lymphoma   . Incisional hernia   . Arthritis   . Blood transfusion without reported diagnosis   . DVT (deep venous thrombosis) ([place])     2011 right leg  . Anorectal fistula   . OSA on CPAP     cpap  10 yrs  . Varicose veins of lower extremities     pe  . Cancer (HCC)     lymphoma hx  .  Cataract   . Cellulitis 04/04/2013   Past Surgical History  Procedure Laterality Date  . Splenectomy    . Prostate surgery      (patient denies)  . Ivc filter    . Tonsillectomy      age 21  . Eye surgery Left 03    cat, detached ret    . Hernia repair    . Total knee arthroplasty Left 11/02/2012    Procedure: TOTAL KNEE ARTHROPLASTY with revision tibia;  Surgeon: Garald Balding, MD;  Location: [place];  Service: Orthopedics;  Laterality: Left;  . Fracture surgery    . Inguinal lymph node biopsy [name] 11/15/2013    Procedure: RIGHT INGUINAL LYMPH NODE BIOPSY;  Surgeon: Gwenyth Ober, MD;  Location: [place];  Service: General;  Laterality: [name];   Social History   Social History  . Marital Status: Married    Spouse Name: [name]  . Number of Children: [name]  . Years of Education: [name]   Social History Main Topics  . Smoking status: Former Smoker -- 2.00 packs/day for 20 years    Types: Cigarettes    Quit date: 05/05/1984  . Smokeless tobacco: Never Used  . Alcohol Use: 0.0 oz/week    0 Standard drinks or equivalent per week  Comment: maybe 1 beer/month  . Drug Use: No  . Sexual Activity: Yes    Birth Control/ Protection: None   Other Topics Concern  . None   Social History Narrative   Family History  Problem Relation Age of Onset  . Heart disease Father   . Varicose Veins Father   . Heart attack Father     dx. 42s  . Heart disease Mother 1  . Varicose Veins Mother   . Pancreatic cancer Sister 39  . Cancer Maternal Grandmother     either stomach or pancreatic primary   . Pancreatic cancer Maternal Grandfather     dx. 67s  . Lung cancer Maternal Aunt     secondhand smoke exposure  . Heart attack Paternal Uncle   . Rectal cancer Maternal Aunt     dx. 27s  . Cancer Maternal Aunt     unknown type  . Diabetes Paternal Uncle   . Cancer Cousin     unknown type  . Breast cancer Cousin     dx. 50s   Allergies  Allergen Reactions  . Hydrocodone Itching and Other (See  Comments)    Severe dizziness, Gives me chills  . Oxycodone Itching and Other (See Comments)    Severe dizziness, Gives me chills   Prior to Admission medications   Medication Sig Start Date End Date Taking? Authorizing Provider  acetaminophen (TYLENOL) 500 MG tablet Take 1,000 mg by mouth every 6 (six) hours as needed for mild pain.   Yes Historical Provider, MD  b complex vitamins tablet Take 1 tablet by mouth every morning.    Yes Historical Provider, MD  calcium carbonate (OS-CAL) 600 MG TABS Take 600 mg by mouth every morning.    Yes Historical Provider, MD  cholecalciferol (VITAMIN D) 1000 UNITS tablet Take 1,000 Units by mouth every morning.    Yes Historical Provider, MD  citalopram (CELEXA) 20 MG tablet Take 1 tablet (20 mg total) by mouth daily. 05/02/14  Yes [name], MD  doxycycline (VIBRAMYCIN) 100 MG capsule Take 100 mg by mouth 2 (two) times daily.   Yes Historical Provider, MD  Magnesium 400 MG CAPS Take 400 mg by mouth daily.    Yes Historical Provider, MD  Multiple Vitamin (THERAGRAN PO) Take 1 tablet by mouth daily.   Yes Historical Provider, MD  Nutritional Supplements (SALMON OIL) CAPS Take 1 capsule by mouth daily.   Yes Historical Provider, MD  sildenafil (VIAGRA) 100 MG tablet Take 1 tablet (100 mg total) by mouth daily as needed for erectile dysfunction. Splits the tablet in half prn 06/29/14  Yes [name], MD  warfarin (COUMADIN) 5 MG tablet Take 1 tablet (5 mg total) by mouth 1 day or 1 dose. As directed Patient taking differently: Take 5 mg by mouth every morning. As directed 06/29/14  Yes [name], MD     ROS: The patient denies unintentional weight loss, chest pain, palpitations, nausea, vomiting, abdominal pain, hematuria, melena, numbness, weakness, or tingling.   All other systems have been reviewed and were otherwise negative with the exception of those mentioned in the HPI and as above.    PHYSICAL EXAM: Filed Vitals:   03/28/15 0849  BP:  142/80  Pulse: 68  Temp: 97.8 F (36.6 C)  Resp: 18   Body mass index is 46.37 kg/(m^2).   General: Alert, no acute distress HEENT:  Normocephalic, atraumatic, oropharynx patent. Bilateral hearing aids. Wax in the left ear. Eye: [facility] Cardiovascular:  Regular rate and rhythm, no rubs murmurs or gallops.  No Carotid bruits, radial pulse intact. No pedal edema.  Respiratory: Clear to auscultation bilaterally.  No wheezes, rales, or rhonchi.  No cyanosis, no use of accessory musculature Abdominal: No organomegaly, abdomen is soft and non-tender, positive bowel sounds.  No masses. Musculoskeletal: Extensive swelling over the right calf with significant redness and pitting edema. Skin: No rashes. Neurologic: Facial musculature symmetric. Psychiatric: Patient acts appropriately throughout our interaction. Lymphatic: No cervical or submandibular lymphadenopathy     LABS:  Results for orders placed or performed in visit on 03/28/15  POCT CBC  Result Value Ref Range   WBC 9.5 4.6 - 10.2 K/uL   Lymph, poc 1.7 0.6 - 3.4   POC LYMPH PERCENT 17.9 10 - 50 %L   MID (cbc) 1.0 (A) 0 - 0.9   POC MID % 10.2 0 - 12 %M   POC Granulocyte 6.8 2 - 6.9   Granulocyte percent 71.9 37 - 80 %G   RBC 4.26 (A) 4.69 - 6.13 M/uL   Hemoglobin 12.8 (A) 14.1 - 18.1 g/dL   HCT, POC 38.5 (A) 43.5 - 53.7 %   MCV 90.3 80 - 97 fL   MCH, POC 30.0 27 - 31.2 pg   MCHC 33.2 31.8 - 35.4 g/dL   RDW, POC 14.9 %   Platelet Count, POC 547 (A) 142 - 424 K/uL   MPV 8.6 0 - 99.8 fL  POCT Microscopic Urinalysis ([facility])  Result Value Ref Range   WBC,UR,HPF,POC Few (A) None WBC/hpf   RBC,UR,HPF,POC Few (A) None RBC/hpf   Bacteria None None, Too numerous to count   Mucus Absent Absent   Epithelial Cells, UR Per Microscopy None None, Too numerous to count cells/hpf  POCT urinalysis dipstick  Result Value Ref Range   Color, UA yellow yellow   Clarity, UA clear clear   Glucose, UA negative negative   Bilirubin,  UA negative negative   Ketones, POC UA negative negative   Spec Grav, UA 1.010    Blood, UA trace-lysed (A) negative   pH, UA 6.5    Protein Ur, POC negative negative   Urobilinogen, UA 0.2    Nitrite, UA Negative Negative   Leukocytes, UA Negative Negative  POCT glucose (manual entry)  Result Value Ref Range   POC Glucose 109 (A) 70 - 99 mg/dl    EKG/XRAY:   Primary read interpreted by Dr. Everlene Farrier at [facility].   ASSESSMENT/PLAN: He has significant swelling on his legs. He states the redness has decreased. He feels better. He no longer has fever and appetite is better. His creatinine elevated prior to discharge and needed to be rechecked today. He is currently on oral doxycycline. We'll recheck on Sunday. B met was done today.I personally performed the services described in this documentation, which was scribed in my presence. The recorded information has been reviewed and is accurate.  By signing my name below, I, Zola Button, attest that this documentation has been prepared under the direction and in the presence of Marc Queen, MD.  Electronically Signed: Zola Button, Medical Scribe. 03/28/2015. 9:06 AM.   [place] sideeffects, risk and benefits, and alternatives of medications d/w patient. Patient is aware that all medications have potential sideeffects and we are unable to predict every sideeffect or drug-drug interaction that may occur.  Marc Queen MD 03/28/2015 9:06 AM

## 2015-03-29 LAB — URINE CULTURE
Colony Count: NO GROWTH
Organism ID, Bacteria: NO GROWTH

## 2015-04-01 ENCOUNTER — Ambulatory Visit (INDEPENDENT_AMBULATORY_CARE_PROVIDER_SITE_OTHER): Payer: Medicare Other | Admitting: Emergency Medicine

## 2015-04-01 VITALS — BP 126/68 | HR 88 | Temp 98.4°F | Resp 18 | Ht 70.5 in | Wt 340.0 lb

## 2015-04-01 DIAGNOSIS — L03115 Cellulitis of right lower limb: Secondary | ICD-10-CM | POA: Diagnosis not present

## 2015-04-01 NOTE — Progress Notes (Signed)
This chart was scribed for Marc Queen, MD by Moises Blood, Medical Scribe. This patient was seen in Room 7 and the patient's care was started 9:50 AM.  Chief Complaint:  Chief Complaint  Patient presents with  . Follow-up    cellulitis right leg    HPI: Marc Mills is a 69 y.o. male who reports to [facility] today for follow up of cellulitis on his right leg. It's been healing and it's been going down rapidly. His weight has been dropping. He's walking a mile a day in the park. He denies fever, chills and night sweats. He's been using coconut oil.   He's planning to see cardiologist tomorrow. He's still taking coumadin.   Past Medical History  Diagnosis Date  . Morbid obesity ([place])   . History of pulmonary embolus (PE)   . History of lymphoma   . Incisional hernia   . Arthritis   . Blood transfusion without reported diagnosis   . DVT (deep venous thrombosis) ([place])     2011 right leg  . Anorectal fistula   . OSA on CPAP     cpap  10 yrs  . Varicose veins of lower extremities     pe  . Cancer (HCC)     lymphoma hx  . Cataract   . Cellulitis 04/04/2013   Past Surgical History  Procedure Laterality Date  . Splenectomy    . Prostate surgery      (patient denies)  . Ivc filter    . Tonsillectomy      age 77  . Eye surgery Left 03    cat, detached ret    . Hernia repair    . Total knee arthroplasty Left 11/02/2012    Procedure: TOTAL KNEE ARTHROPLASTY with revision tibia;  Surgeon: Garald Balding, MD;  Location: [place];  Service: Orthopedics;  Laterality: Left;  . Fracture surgery    . Inguinal lymph node biopsy [name] 11/15/2013    Procedure: RIGHT INGUINAL LYMPH NODE BIOPSY;  Surgeon: Gwenyth Ober, MD;  Location: [place];  Service: General;  Laterality: [name];   Social History   Social History  . Marital Status: Married    Spouse Name: [name]  . Number of Children: [name]  . Years of Education: [name]   Social History Main Topics  . Smoking status: Former Smoker --  2.00 packs/day for 20 years    Types: Cigarettes    Quit date: 05/05/1984  . Smokeless tobacco: Never Used  . Alcohol Use: 0.0 oz/week    0 Standard drinks or equivalent per week     Comment: maybe 1 beer/month  . Drug Use: No  . Sexual Activity: Yes    Birth Control/ Protection: None   Other Topics Concern  . None   Social History Narrative   Family History  Problem Relation Age of Onset  . Heart disease Father   . Varicose Veins Father   . Heart attack Father     dx. 11s  . Heart disease Mother 64  . Varicose Veins Mother   . Pancreatic cancer Sister 12  . Cancer Maternal Grandmother     either stomach or pancreatic primary   . Pancreatic cancer Maternal Grandfather     dx. 22s  . Lung cancer Maternal Aunt     secondhand smoke exposure  . Heart attack Paternal Uncle   . Rectal cancer Maternal Aunt     dx. 14s  . Cancer Maternal Aunt  unknown type  . Diabetes Paternal Uncle   . Cancer Cousin     unknown type  . Breast cancer Cousin     dx. 50s   Allergies  Allergen Reactions  . Hydrocodone Itching and Other (See Comments)    Severe dizziness, Gives me chills  . Oxycodone Itching and Other (See Comments)    Severe dizziness, Gives me chills   Prior to Admission medications   Medication Sig Start Date End Date Taking? Authorizing Provider  acetaminophen (TYLENOL) 500 MG tablet Take 1,000 mg by mouth every 6 (six) hours as needed for mild pain.    Historical Provider, MD  b complex vitamins tablet Take 1 tablet by mouth every morning.     Historical Provider, MD  calcium carbonate (OS-CAL) 600 MG TABS Take 600 mg by mouth every morning.     Historical Provider, MD  cholecalciferol (VITAMIN D) 1000 UNITS tablet Take 1,000 Units by mouth every morning.     Historical Provider, MD  citalopram (CELEXA) 20 MG tablet Take 1 tablet (20 mg total) by mouth daily. 05/02/14   [name], MD  doxycycline (VIBRAMYCIN) 100 MG capsule Take 1 capsule (100 mg total) by  mouth 2 (two) times daily. 03/28/15   [name], MD  Magnesium 400 MG CAPS Take 400 mg by mouth daily.     Historical Provider, MD  Multiple Vitamin (THERAGRAN PO) Take 1 tablet by mouth daily.    Historical Provider, MD  Nutritional Supplements (SALMON OIL) CAPS Take 1 capsule by mouth daily.    Historical Provider, MD  sildenafil (VIAGRA) 100 MG tablet Take 1 tablet (100 mg total) by mouth daily as needed for erectile dysfunction. Splits the tablet in half prn 06/29/14   [name], MD  warfarin (COUMADIN) 5 MG tablet Take 1 tablet (5 mg total) by mouth 1 day or 1 dose. As directed Patient taking differently: Take 5 mg by mouth every morning. As directed 06/29/14   [name], MD     ROS:  Constitutional: negative for chills, fever, night sweats, weight changes, or fatigue  HEENT: negative for vision changes, hearing loss, congestion, rhinorrhea, ST, epistaxis, or sinus pressure Cardiovascular: negative for chest pain or palpitations Respiratory: negative for hemoptysis, wheezing, shortness of breath, or cough Abdominal: negative for abdominal pain, nausea, vomiting, diarrhea, or constipation Dermatological: positive for rash (right leg)  Neurologic: negative for headache, dizziness, or syncope Musc: edema (right leg)  All other systems reviewed and are otherwise negative with the exception to those above and in the HPI.  PHYSICAL EXAM: Filed Vitals:   04/01/15 0946  BP: 126/68  Pulse: 88  Temp: 98.4 F (36.9 C)  Resp: 18   Body mass index is 48.08 kg/(m^2).   General: Alert, no acute distress HEENT:  Normocephalic, atraumatic, oropharynx patent. Eye: [facility]te M[facility] Cardiovascular:  Regular rate and rhythm, no rubs murmurs or gallops.  No Carotid bruits, radial pulse intact. No pedal edema.  Respiratory: Clear to auscultation bilaterally.  No wheezes, rales, or rhonchi.  No cyanosis, no use of accessory musculature Abdominal: No organomegaly, abdomen is soft and  non-tender, positive bowel sounds. No masses. Musculoskeletal: Gait intact. No edema, tenderness Skin: Right leg: decreased redness and swelling, skin is beginning to peel, much less swelling noted than 3 days ago. Neurologic: Facial musculature symmetric. Psychiatric: Patient acts appropriately throughout our interaction.  Lymphatic: No cervical or submandibular lymphadenopathy Genitourinary/Anorectal: No acute findings    LABS:    EKG/XRAY:  Primary read interpreted by Dr. Daub at [facility].   ASSESSMENT/PLAN: Pt will finish another 10 days of doxycycline, I'll check him at that time to see if continued abx are necessary   By signing my name below, I, Tsung-Kai Tsai, attest that this documentation has been prepared under the direction and in the presence of Steven Daub, MD. Electronically Signed: Tsung-Kai Tsai, Scribe. 04/01/2015 , 9:50 AM .    Gross sideeffects, risk and benefits, and alternatives of medications d/w patient. Patient is aware that all medications have potential sideeffects and we are unable to predict every sideeffect or drug-drug interaction that may occur.  Steven Daub MD 04/01/2015 9:50 AM   

## 2015-04-02 ENCOUNTER — Ambulatory Visit (INDEPENDENT_AMBULATORY_CARE_PROVIDER_SITE_OTHER): Payer: Medicare Other | Admitting: Pharmacist

## 2015-04-02 DIAGNOSIS — Z792 Long term (current) use of antibiotics: Secondary | ICD-10-CM | POA: Diagnosis not present

## 2015-04-02 DIAGNOSIS — Z86718 Personal history of other venous thrombosis and embolism: Secondary | ICD-10-CM | POA: Diagnosis not present

## 2015-04-02 DIAGNOSIS — Z5181 Encounter for therapeutic drug level monitoring: Secondary | ICD-10-CM | POA: Diagnosis not present

## 2015-04-02 LAB — POCT INR: INR: 4.2

## 2015-04-06 ENCOUNTER — Ambulatory Visit (INDEPENDENT_AMBULATORY_CARE_PROVIDER_SITE_OTHER): Payer: Medicare Other | Admitting: Pharmacist

## 2015-04-06 DIAGNOSIS — Z5181 Encounter for therapeutic drug level monitoring: Secondary | ICD-10-CM

## 2015-04-06 DIAGNOSIS — Z86718 Personal history of other venous thrombosis and embolism: Secondary | ICD-10-CM

## 2015-04-06 DIAGNOSIS — Z792 Long term (current) use of antibiotics: Secondary | ICD-10-CM

## 2015-04-06 LAB — POCT INR: INR: 2.6

## 2015-04-11 ENCOUNTER — Ambulatory Visit (INDEPENDENT_AMBULATORY_CARE_PROVIDER_SITE_OTHER): Payer: Medicare Other | Admitting: Emergency Medicine

## 2015-04-11 VITALS — BP 144/80 | HR 73 | Temp 98.0°F | Resp 16 | Ht 70.5 in | Wt 346.4 lb

## 2015-04-11 DIAGNOSIS — L03115 Cellulitis of right lower limb: Secondary | ICD-10-CM | POA: Diagnosis not present

## 2015-04-11 DIAGNOSIS — I82401 Acute embolism and thrombosis of unspecified deep veins of right lower extremity: Secondary | ICD-10-CM

## 2015-04-11 DIAGNOSIS — I878 Other specified disorders of veins: Secondary | ICD-10-CM | POA: Diagnosis not present

## 2015-04-11 MED ORDER — DOXYCYCLINE HYCLATE 100 MG PO CAPS: 100.0000 mg | ORAL_CAPSULE | Freq: Two times a day (BID) | ORAL | Status: DC

## 2015-04-11 NOTE — Progress Notes (Addendum)
Patient ID: Marc Mills, male   DOB: Feb 17, 1946, 68 y.o.   MRN: [id number]     By signing my name below, I, Zola Button, attest that this documentation has been prepared under the direction and in the presence of [name], MD.  Electronically Signed: Zola Button, Medical Scribe. 04/11/2015. 10:25 AM.   Chief Complaint:  Chief Complaint  Patient presents with  . Follow-up    right lower leg    HPI: Marc Mills is a 69 y.o. male with a history of splenic marginal zone b-cell lymphoma, PE, DVT, and varicose veins who reports to [facility] today for a follow-up for cellulitis in his right lower leg. The redness has resolved, but he still has significant swelling to his right leg and is unable to wear his compression hoses. He will finish his current course of antibiotics tomorrow. Patient notes that his current episode of cellulitis is the worst one he has had. He is still taking the Coumadin.  Patient recently bought a house in [place] and plans to live part-time down there. He will be going to [place] [employer] [place] next in 8 days and will be staying for 2 days.  Past Medical History  Diagnosis Date  . Morbid obesity ([place])   . History of pulmonary embolus (PE)   . History of lymphoma   . Incisional hernia   . Arthritis   . Blood transfusion without reported diagnosis   . DVT (deep venous thrombosis) ([place])     2011 right leg  . Anorectal fistula   . OSA on CPAP     cpap  10 yrs  . Varicose veins of lower extremities     pe  . Cancer (HCC)     lymphoma hx  . Cataract   . Cellulitis 04/04/2013   Past Surgical History  Procedure Laterality Date  . Splenectomy    . Prostate surgery      (patient denies)  . Ivc filter    . Tonsillectomy      age 4  . Eye surgery Left 03    cat, detached ret    . Hernia repair    . Total knee arthroplasty Left 11/02/2012    Procedure: TOTAL KNEE ARTHROPLASTY with revision tibia;  Surgeon: Garald Balding, MD;  Location: [place];  Service:  Orthopedics;  Laterality: Left;  . Fracture surgery    . Inguinal lymph node biopsy [name] 11/15/2013    Procedure: RIGHT INGUINAL LYMPH NODE BIOPSY;  Surgeon: Gwenyth Ober, MD;  Location: [place];  Service: General;  Laterality: [name];   Social History   Social History  . Marital Status: Married    Spouse Name: [name]  . Number of Children: [name]  . Years of Education: [name]   Social History Main Topics  . Smoking status: Former Smoker -- 2.00 packs/day for 20 years    Types: Cigarettes    Quit date: 05/05/1984  . Smokeless tobacco: Never Used  . Alcohol Use: 0.0 oz/week    0 Standard drinks or equivalent per week     Comment: maybe 1 beer/month  . Drug Use: No  . Sexual Activity: Yes    Birth Control/ Protection: None   Other Topics Concern  . None   Social History Narrative   Family History  Problem Relation Age of Onset  . Heart disease Father   . Varicose Veins Father   . Heart attack Father     dx. 33s  . Heart disease Mother 56  .  Varicose Veins Mother   . Pancreatic cancer Sister 65  . Cancer Maternal Grandmother     either stomach or pancreatic primary   . Pancreatic cancer Maternal Grandfather     dx. 70s  . Lung cancer Maternal Aunt     secondhand smoke exposure  . Heart attack Paternal Uncle   . Rectal cancer Maternal Aunt     dx. 65s  . Cancer Maternal Aunt     unknown type  . Diabetes Paternal Uncle   . Cancer Cousin     unknown type  . Breast cancer Cousin     dx. 50s   Allergies  Allergen Reactions  . Hydrocodone Itching and Other (See Comments)    Severe dizziness, Gives me chills  . Oxycodone Itching and Other (See Comments)    Severe dizziness, Gives me chills   Prior to Admission medications   Medication Sig Start Date End Date Taking? Authorizing Provider  acetaminophen (TYLENOL) 500 MG tablet Take 1,000 mg by mouth every 6 (six) hours as needed for mild pain.   Yes Historical Provider, MD  b complex vitamins tablet Take 1 tablet by mouth every  morning.    Yes Historical Provider, MD  calcium carbonate (OS-CAL) 600 MG TABS Take 600 mg by mouth every morning.    Yes Historical Provider, MD  cholecalciferol (VITAMIN D) 1000 UNITS tablet Take 1,000 Units by mouth every morning.    Yes Historical Provider, MD  citalopram (CELEXA) 20 MG tablet Take 1 tablet (20 mg total) by mouth daily. 05/02/14  Yes [name], MD  doxycycline (VIBRAMYCIN) 100 MG capsule Take 1 capsule (100 mg total) by mouth 2 (two) times daily. 03/28/15  Yes [name], MD  Magnesium 400 MG CAPS Take 400 mg by mouth daily.    Yes Historical Provider, MD  Multiple Vitamin (THERAGRAN PO) Take 1 tablet by mouth daily.   Yes Historical Provider, MD  Nutritional Supplements (SALMON OIL) CAPS Take 1 capsule by mouth daily.   Yes Historical Provider, MD  sildenafil (VIAGRA) 100 MG tablet Take 1 tablet (100 mg total) by mouth daily as needed for erectile dysfunction. Splits the tablet in half prn 06/29/14  Yes [name], MD  warfarin (COUMADIN) 5 MG tablet Take 1 tablet (5 mg total) by mouth 1 day or 1 dose. As directed Patient taking differently: Take 5 mg by mouth every morning. As directed 06/29/14  Yes [name], MD     ROS: The patient denies fevers, chills, night sweats, unintentional weight loss, chest pain, palpitations, wheezing, dyspnea on exertion, nausea, vomiting, abdominal pain, dysuria, hematuria, melena, numbness, weakness, or tingling.   All other systems have been reviewed and were otherwise negative with the exception of those mentioned in the HPI and as above.    PHYSICAL EXAM: Filed Vitals:   04/11/15 1022  BP: 144/80  Pulse: 73  Temp: 98 F (36.7 C)  Resp: 16   Body mass index is 48.98 kg/(m^2).   General: Alert, no acute distress HEENT:  Normocephalic, atraumatic, oropharynx patent. Eye: Juliette Mangle [facility] Cardiovascular:  Regular rate and rhythm, no rubs murmurs or gallops.  No Carotid bruits, radial pulse intact. No pedal edema.    Respiratory: Clear to auscultation bilaterally.  No wheezes, rales, or rhonchi.  No cyanosis, no use of accessory musculature Abdominal: No organomegaly, abdomen is soft and non-tender, positive bowel sounds.  No masses. Musculoskeletal: Significant swelling of the right calf. Previously noted redness has resolved. There  persists some mild peeling of the skin. Skin: No rashes. Neurologic: Facial musculature symmetric. Psychiatric: Patient acts appropriately throughout our interaction. Lymphatic: No cervical or submandibular lymphadenopathy    LABS:    EKG/XRAY:   Primary read interpreted by Dr. Everlene Farrier at [facility].   ASSESSMENT/PLAN: Patient continues to have significant swelling. He will continue on doxycycline. I will recheck in 10 days. Referral made to vein and vascular for their recommendations.I personally performed the services described in this documentation, which was scribed in my presence. The recorded information has been reviewed and is accurate. Doppler study was done on Friday, December 9 and does reveal DVT right popliteal and gastrocnemius veins. Will initiate Lovenox 150 every 12. Repeat Doppler study in 2 weeks. The Coumadin clinic will be notified. Patient is aware.I personally performed the services described in this documentation, which was scribed in my presence. The recorded information has been reviewed and is accurate. I spoke with the Coumadin clinic. They will recheck him next week to check his PT/INR. His last PT/INR 1 week ago was therapeutic. I received another phone call back from the Coumadin clinic. They discussed his case with the coag specialist there. Decision made to not give Lovenox to stop Coumadin and start Xareto with follow-up venous Doppler in 2 weeks. I will check him next Wednesday.I personally performed the services described in this documentation, which was scribed in my presence. The recorded information has been reviewed and is accurate.    Gross  sideeffects, risk and benefits, and alternatives of medications d/w patient. Patient is aware that all medications have potential sideeffects and we are unable to predict every sideeffect or drug-drug interaction that may occur.  [name] MD 04/11/2015 10:25 AM

## 2015-04-11 NOTE — Addendum Note (Signed)
Addended by: Jerl Santos R on: 04/11/2015 03:41 PM   Modules accepted: Orders

## 2015-04-13 ENCOUNTER — Ambulatory Visit (INDEPENDENT_AMBULATORY_CARE_PROVIDER_SITE_OTHER): Payer: Medicare Other | Admitting: Pharmacist

## 2015-04-13 ENCOUNTER — Ambulatory Visit (HOSPITAL_COMMUNITY)
Admission: RE | Admit: 2015-04-13 | Discharge: 2015-04-13 | Disposition: A | Discharge status: Home / Self Care | Payer: Medicare Other | Source: Ambulatory Visit | Attending: Emergency Medicine | Admitting: Emergency Medicine

## 2015-04-13 DIAGNOSIS — Z5181 Encounter for therapeutic drug level monitoring: Secondary | ICD-10-CM | POA: Diagnosis not present

## 2015-04-13 DIAGNOSIS — Z792 Long term (current) use of antibiotics: Secondary | ICD-10-CM | POA: Diagnosis not present

## 2015-04-13 DIAGNOSIS — Z86718 Personal history of other venous thrombosis and embolism: Secondary | ICD-10-CM

## 2015-04-13 DIAGNOSIS — L03115 Cellulitis of right lower limb: Secondary | ICD-10-CM | POA: Insufficient documentation

## 2015-04-13 DIAGNOSIS — I878 Other specified disorders of veins: Secondary | ICD-10-CM | POA: Insufficient documentation

## 2015-04-13 LAB — POCT INR: INR: 2.8

## 2015-04-13 MED ORDER — RIVAROXABAN 20 MG PO TABS: 20.0000 mg | ORAL_TABLET | Freq: Every day | ORAL | Status: DC

## 2015-04-13 MED ORDER — RIVAROXABAN (XARELTO) VTE STARTER PACK (15 & 20 MG): ORAL_TABLET | ORAL | Status: DC

## 2015-04-13 MED ORDER — ENOXAPARIN SODIUM 150 MG/ML ~~LOC~~ SOLN: 150.0000 mg | Freq: Two times a day (BID) | SUBCUTANEOUS | Status: DC

## 2015-04-13 NOTE — Addendum Note (Signed)
Addended by: [name] A on: 04/13/2015 10:30 AM   Modules accepted: Orders

## 2015-04-13 NOTE — Progress Notes (Signed)
*  Preliminary Results* Right lower extremity venous duplex completed. Right lower extremity is positive for acute deep vein thrombosis involving the right popliteal and gastrocnemius veins. There is no evidence of right Baker's cyst.   04/13/2015 9:55 AM  Maudry Mayhew, RVT, RDCS, RDMS

## 2015-04-23 ENCOUNTER — Other Ambulatory Visit: Payer: Self-pay | Admitting: Family Medicine

## 2015-04-23 ENCOUNTER — Ambulatory Visit (INDEPENDENT_AMBULATORY_CARE_PROVIDER_SITE_OTHER): Payer: Medicare Other | Admitting: Emergency Medicine

## 2015-04-23 VITALS — BP 144/80 | HR 73 | Temp 98.2°F | Resp 18 | Wt 342.0 lb

## 2015-04-23 DIAGNOSIS — L03115 Cellulitis of right lower limb: Secondary | ICD-10-CM | POA: Diagnosis not present

## 2015-04-23 DIAGNOSIS — I82401 Acute embolism and thrombosis of unspecified deep veins of right lower extremity: Secondary | ICD-10-CM | POA: Diagnosis not present

## 2015-04-23 DIAGNOSIS — I878 Other specified disorders of veins: Secondary | ICD-10-CM | POA: Diagnosis not present

## 2015-04-23 MED ORDER — DOXYCYCLINE HYCLATE 100 MG PO CAPS: 100.0000 mg | ORAL_CAPSULE | Freq: Two times a day (BID) | ORAL | Status: DC

## 2015-04-23 NOTE — Progress Notes (Signed)
Patient ID: Marc Mills, male   DOB: 30-Mar-1946, 69 y.o.   MRN: [id number]    By signing my name below, I, Marc Mills, attest that this documentation has been prepared under the direction and in the presence of Marc Russian, MD Electronically Signed: Ladene Artist, ED Scribe 04/23/2015 at 12:23 PM.  Chief Complaint:  Chief Complaint  Patient presents with  . Follow-up  . Cellulitis   HPI: Marc Mills is a 69 y.o. male, with a h/o DVT, who reports to [facility] today for a follow-up regarding cellulitis. Pt was seen on 04/11/15 for right lower leg cellulitis with associated swelling and improving redness. He states that he started using a leg pump 2 days ago and has noticed improvement to redness and leg swelling. He has also started wearing compression stockings as well. Pt has been on antibiotics for 5 weeks now. Pt states that he experienced cramping and myalgias within 5 days of starting Xarelto 10 days ago which has since resolved.   Pt has a new grandchild in [place], [place].   Past Medical History  Diagnosis Date  . Morbid obesity ([place])   . History of pulmonary embolus (PE)   . History of lymphoma   . Incisional hernia   . Arthritis   . Blood transfusion without reported diagnosis   . DVT (deep venous thrombosis) ([place])     2011 right leg  . Anorectal fistula   . OSA on CPAP     cpap  10 yrs  . Varicose veins of lower extremities     pe  . Cancer (HCC)     lymphoma hx  . Cataract   . Cellulitis 04/04/2013   Past Surgical History  Procedure Laterality Date  . Splenectomy    . Prostate surgery      (patient denies)  . Ivc filter    . Tonsillectomy      age 49  . Eye surgery Left 03    cat, detached ret    . Hernia repair    . Total knee arthroplasty Left 11/02/2012    Procedure: TOTAL KNEE ARTHROPLASTY with revision tibia;  Surgeon: Garald Balding, MD;  Location: [place];  Service: Orthopedics;  Laterality: Left;  . Fracture surgery    . Inguinal lymph  node biopsy [name] 11/15/2013    Procedure: RIGHT INGUINAL LYMPH NODE BIOPSY;  Surgeon: Marc Ober, MD;  Location: [place];  Service: General;  Laterality: [name];   Social History   Social History  . Marital Status: Married    Spouse Name: [name]  . Number of Children: [name]  . Years of Education: [name]   Social History Main Topics  . Smoking status: Former Smoker -- 2.00 packs/day for 20 years    Types: Cigarettes    Quit date: 05/05/1984  . Smokeless tobacco: Never Used  . Alcohol Use: 0.0 oz/week    0 Standard drinks or equivalent per week     Comment: maybe 1 beer/month  . Drug Use: No  . Sexual Activity: Yes    Birth Control/ Protection: None   Other Topics Concern  . None   Social History Narrative   Family History  Problem Relation Age of Onset  . Heart disease Father   . Varicose Veins Father   . Heart attack Father     dx. 39s  . Heart disease Mother 23  . Varicose Veins Mother   . Pancreatic cancer Sister 80  . Cancer Maternal Grandmother  either stomach or pancreatic primary   . Pancreatic cancer Maternal Grandfather     dx. 41s  . Lung cancer Maternal Aunt     secondhand smoke exposure  . Heart attack Paternal Uncle   . Rectal cancer Maternal Aunt     dx. 50s  . Cancer Maternal Aunt     unknown type  . Diabetes Paternal Uncle   . Cancer Cousin     unknown type  . Breast cancer Cousin     dx. 50s   Allergies  Allergen Reactions  . Hydrocodone Itching and Other (See Comments)    Severe dizziness, Gives me chills  . Oxycodone Itching and Other (See Comments)    Severe dizziness, Gives me chills   Prior to Admission medications   Medication Sig Start Date End Date Taking? Authorizing Provider  acetaminophen (TYLENOL) 500 MG tablet Take 1,000 mg by mouth every 6 (six) hours as needed for mild pain.   Yes Historical Provider, MD  b complex vitamins tablet Take 1 tablet by mouth every morning.    Yes Historical Provider, MD  calcium carbonate (OS-CAL) 600  MG TABS Take 600 mg by mouth every morning.    Yes Historical Provider, MD  cholecalciferol (VITAMIN D) 1000 UNITS tablet Take 1,000 Units by mouth every morning.    Yes Historical Provider, MD  citalopram (CELEXA) 20 MG tablet Take 1 tablet (20 mg total) by mouth daily. 05/02/14  Yes Marc Russian, MD  Magnesium 400 MG CAPS Take 400 mg by mouth daily.    Yes Historical Provider, MD  Multiple Vitamin (THERAGRAN PO) Take 1 tablet by mouth daily.   Yes Historical Provider, MD  Nutritional Supplements (SALMON OIL) CAPS Take 1 capsule by mouth daily.   Yes Historical Provider, MD  rivaroxaban (XARELTO) 20 MG TABS tablet Take 1 tablet (20 mg total) by mouth daily with supper. 04/13/15  Yes Marc Russian, MD  sildenafil (VIAGRA) 100 MG tablet Take 1 tablet (100 mg total) by mouth daily as needed for erectile dysfunction. Splits the tablet in half prn 06/29/14  Yes Marc Russian, MD  doxycycline (VIBRAMYCIN) 100 MG capsule Take 1 capsule (100 mg total) by mouth 2 (two) times daily. Patient not taking: Reported on 04/23/2015 04/11/15   Marc Russian, MD  Rivaroxaban (XARELTO STARTER PACK) 15 & 20 MG TBPK Take as directed on package. 04/13/15   Marc Russian, MD    ROS: The patient denies fevers, chills, night sweats, unintentional weight loss, chest pain, palpitations, wheezing, dyspnea on exertion, nausea, vomiting, abdominal pain, dysuria, hematuria, melena, numbness, weakness, or tingling. +color change, +leg swelling  All other systems have been reviewed and were otherwise negative with the exception of those mentioned in the HPI and as above.    PHYSICAL EXAM: Filed Vitals:   04/23/15 1145  BP: 144/80  Pulse: 73  Temp: 98.2 F (36.8 C)  Resp: 18   Body mass index is 48.36 kg/(m^2).  General: Alert, no acute distress HEENT:  Normocephalic, atraumatic, oropharynx patent. [facility]: [facility] Cardiovascular:  Regular rate and rhythm, no rubs murmurs or gallops. No Carotid bruits, radial pulse  intact. No pedal edema.  Respiratory: Clear to auscultation bilaterally. No wheezes, rales, or rhonchi. No cyanosis, no use of accessory musculature Abdominal: No organomegaly, abdomen is soft and non-tender, positive bowel sounds. No masses. Musculoskeletal: Gait intact. Significant swelling of the R calf but decreased from previous. Persistent redness over the shin but less than previous.  Skin: No rashes. Neurologic: Facial musculature symmetric. Psychiatric: Patient acts appropriately throughout our interaction. Lymphatic: No cervical or submandibular lymphadenopathy  LABS:  EKG/XRAY:   Primary read interpreted by Dr. Everlene Farrier at [facility].  ASSESSMENT/PLAN: Will continue doxycycline for now. He did have a DVT when seen last visit. He is currently off of Coumadin and on Xarelto and doing well. He is now able to wear compression stockings and able to swim. Recheck in 2 weeks.   I personally performed the services described in this documentation, which was scribed in my presence. The recorded information has been reviewed and is accurate.  Gross sideeffects, risk and benefits, and alternatives of medications d/w patient. Patient is aware that all medications have potential sideeffects and we are unable to predict every sideeffect or drug-drug interaction that may occur.  [name] MD 04/23/2015 12:01 PM

## 2015-04-23 NOTE — Addendum Note (Signed)
Addended by: Orion Crook on: 04/23/2015 12:21 PM   Modules accepted: Orders

## 2015-04-23 NOTE — Progress Notes (Signed)
Please disregard venous doppler order. He was started on xarelto instead of lovenox. Does not need repeat u/s

## 2015-05-07 ENCOUNTER — Ambulatory Visit (INDEPENDENT_AMBULATORY_CARE_PROVIDER_SITE_OTHER): Payer: Medicare Other | Admitting: Emergency Medicine

## 2015-05-07 VITALS — BP 122/82 | HR 78 | Temp 98.0°F | Resp 20 | Wt 340.6 lb

## 2015-05-07 DIAGNOSIS — L03115 Cellulitis of right lower limb: Secondary | ICD-10-CM

## 2015-05-07 MED ORDER — DOXYCYCLINE HYCLATE 100 MG PO CAPS: 100.0000 mg | ORAL_CAPSULE | Freq: Two times a day (BID) | ORAL | Status: DC

## 2015-05-07 NOTE — Progress Notes (Signed)
Patient ID: Marc Mills, male   DOB: 1945/11/22, 70 y.o.   MRN: [id number]     By signing my name below, I, Zola Button, attest that this documentation has been prepared under the direction and in the presence of [name], MD.  Electronically Signed: Zola Button, Medical Scribe. 05/07/2015. 10:50 AM.   Chief Complaint:  Chief Complaint  Patient presents with  . Follow-up    for cellulitis of right leg    HPI: Marc Mills is a 70 y.o. male with a history of varicose veins, PE, and DVT who reports to [facility] today for a follow-up for cellulitis of right leg. Patient notes his leg has improved, but he still has swelling to his leg. He notes that with his current episode of cellulitis, he began to feel ill very suddenly and notes he had never felt as ill in his previous episodes.  Patient went down to [place] over the holidays. He will be going on a cruise to [place] for 7 days.  Past Medical History  Diagnosis Date  . Morbid obesity ([place])   . History of pulmonary embolus (PE)   . History of lymphoma   . Incisional hernia   . Arthritis   . Blood transfusion without reported diagnosis   . DVT (deep venous thrombosis) ([place])     2011 right leg  . Anorectal fistula   . OSA on CPAP     cpap  10 yrs  . Varicose veins of lower extremities     pe  . Cancer (HCC)     lymphoma hx  . Cataract   . Cellulitis 04/04/2013   Past Surgical History  Procedure Laterality Date  . Splenectomy    . Prostate surgery      (patient denies)  . Ivc filter    . Tonsillectomy      age 51  . Eye surgery Left 03    cat, detached ret    . Hernia repair    . Total knee arthroplasty Left 11/02/2012    Procedure: TOTAL KNEE ARTHROPLASTY with revision tibia;  Surgeon: Garald Balding, MD;  Location: [place];  Service: Orthopedics;  Laterality: Left;  . Fracture surgery    . Inguinal lymph node biopsy [name] 11/15/2013    Procedure: RIGHT INGUINAL LYMPH NODE BIOPSY;  Surgeon: Gwenyth Ober,  MD;  Location: [place];  Service: General;  Laterality: [name];   Social History   Social History  . Marital Status: Married    Spouse Name: [name]  . Number of Children: [name]  . Years of Education: [name]   Social History Main Topics  . Smoking status: Former Smoker -- 2.00 packs/day for 20 years    Types: Cigarettes    Quit date: 05/05/1984  . Smokeless tobacco: Never Used  . Alcohol Use: 0.0 oz/week    0 Standard drinks or equivalent per week     Comment: maybe 1 beer/month  . Drug Use: No  . Sexual Activity: Yes    Birth Control/ Protection: None   Other Topics Concern  . None   Social History Narrative   Family History  Problem Relation Age of Onset  . Heart disease Father   . Varicose Veins Father   . Heart attack Father     dx. 21s  . Heart disease Mother 45  . Varicose Veins Mother   . Pancreatic cancer Sister 36  . Cancer Maternal Grandmother     either stomach or pancreatic  primary   . Pancreatic cancer Maternal Grandfather     dx. 95s  . Lung cancer Maternal Aunt     secondhand smoke exposure  . Heart attack Paternal Uncle   . Rectal cancer Maternal Aunt     dx. 33s  . Cancer Maternal Aunt     unknown type  . Diabetes Paternal Uncle   . Cancer Cousin     unknown type  . Breast cancer Cousin     dx. 50s   Allergies  Allergen Reactions  . Hydrocodone Itching and Other (See Comments)    Severe dizziness, Gives me chills  . Oxycodone Itching and Other (See Comments)    Severe dizziness, Gives me chills   Prior to Admission medications   Medication Sig Start Date End Date Taking? Authorizing Provider  acetaminophen (TYLENOL) 500 MG tablet Take 1,000 mg by mouth every 6 (six) hours as needed for mild pain.   Yes Historical Provider, MD  b complex vitamins tablet Take 1 tablet by mouth every morning.    Yes Historical Provider, MD  calcium carbonate (OS-CAL) 600 MG TABS Take 600 mg by mouth every morning.    Yes Historical Provider, MD  cholecalciferol  (VITAMIN D) 1000 UNITS tablet Take 1,000 Units by mouth every morning.    Yes Historical Provider, MD  citalopram (CELEXA) 20 MG tablet Take 1 tablet (20 mg total) by mouth daily. 05/02/14  Yes [name], MD  doxycycline (VIBRAMYCIN) 100 MG capsule Take 1 capsule (100 mg total) by mouth 2 (two) times daily. 04/23/15  Yes [name], MD  Magnesium 400 MG CAPS Take 400 mg by mouth daily.    Yes Historical Provider, MD  Multiple Vitamin (THERAGRAN PO) Take 1 tablet by mouth daily.   Yes Historical Provider, MD  Nutritional Supplements (SALMON OIL) CAPS Take 1 capsule by mouth daily.   Yes Historical Provider, MD  Rivaroxaban (XARELTO STARTER PACK) 15 & 20 MG TBPK Take as directed on package. 04/13/15  Yes [name], MD  rivaroxaban (XARELTO) 20 MG TABS tablet Take 1 tablet (20 mg total) by mouth daily with supper. 04/13/15  Yes [name], MD  sildenafil (VIAGRA) 100 MG tablet Take 1 tablet (100 mg total) by mouth daily as needed for erectile dysfunction. Splits the tablet in half prn 06/29/14  Yes [name], MD     ROS: The patient denies fevers, chills, night sweats, unintentional weight loss, chest pain, palpitations, wheezing, dyspnea on exertion, nausea, vomiting, abdominal pain, dysuria, hematuria, melena, numbness, weakness, or tingling.   All other systems have been reviewed and were otherwise negative with the exception of those mentioned in the HPI and as above.    PHYSICAL EXAM: Filed Vitals:   05/07/15 1030  BP: 122/82  Pulse: 78  Temp: 98 F (36.7 C)  Resp: 20   Body mass index is 48.16 kg/(m^2).   General: Alert, no acute distress HEENT:  Normocephalic, atraumatic, oropharynx patent. Eye: Juliette Mangle [facility] Cardiovascular:  Regular rate and rhythm, no rubs murmurs or gallops.  No Carotid bruits, radial pulse intact. No pedal edema.  Respiratory: Clear to auscultation bilaterally.  No wheezes, rales, or rhonchi.  No cyanosis, no use of accessory  musculature Abdominal: No organomegaly, abdomen is soft and non-tender, positive bowel sounds.  No masses. Musculoskeletal: There is a marked decrease in the swelling of the right calf compared to previous. The induration has also decreased. The right calf is still larger than the left.  Skin: No rashes. Neurologic: Facial musculature symmetric. Psychiatric: Patient acts appropriately throughout our interaction. Lymphatic: No cervical or submandibular lymphadenopathy     LABS:    EKG/XRAY:   Primary read interpreted by Dr. Everlene Farrier at [facility].   ASSESSMENT/PLAN: Patient is doing well. He has about another week's worth of doxycycline and then will stop. He does have a trip planned) next month. He will carry antibiotics with him for that trip. He is scheduled to see the vascular surgeon and have a repeat Doppler in a few weeks.I personally performed the services described in this documentation, which was scribed in my presence. The recorded information has been reviewed and is accurate.I personally performed the services described in this documentation, which was scribed in my presence. The recorded information has been reviewed and is accurate.    Gross sideeffects, risk and benefits, and alternatives of medications d/w patient. Patient is aware that all medications have potential sideeffects and we are unable to predict every sideeffect or drug-drug interaction that may occur.  [name] MD 05/07/2015 10:50 AM

## 2015-05-11 ENCOUNTER — Encounter: Payer: Self-pay | Admitting: Vascular Surgery

## 2015-05-12 ENCOUNTER — Other Ambulatory Visit: Payer: Self-pay | Admitting: Emergency Medicine

## 2015-05-16 ENCOUNTER — Ambulatory Visit (INDEPENDENT_AMBULATORY_CARE_PROVIDER_SITE_OTHER): Payer: Medicare Other

## 2015-05-16 DIAGNOSIS — I2782 Chronic pulmonary embolism: Secondary | ICD-10-CM

## 2015-05-16 DIAGNOSIS — I82401 Acute embolism and thrombosis of unspecified deep veins of right lower extremity: Secondary | ICD-10-CM

## 2015-05-16 DIAGNOSIS — Z5181 Encounter for therapeutic drug level monitoring: Secondary | ICD-10-CM

## 2015-05-16 DIAGNOSIS — I82409 Acute embolism and thrombosis of unspecified deep veins of unspecified lower extremity: Secondary | ICD-10-CM | POA: Diagnosis not present

## 2015-05-16 LAB — CBC
HCT: 38.9 % — ABNORMAL LOW (ref 39.0–52.0)
Hemoglobin: 13.2 g/dL (ref 13.0–17.0)
MCH: 30.6 pg (ref 26.0–34.0)
MCHC: 33.9 g/dL (ref 30.0–36.0)
MCV: 90.3 fL (ref 78.0–100.0)
MPV: 9.8 fL (ref 8.6–12.4)
Platelets: 309 10*3/uL (ref 150–400)
RBC: 4.31 MIL/uL (ref 4.22–5.81)
RDW: 14.6 % (ref 11.5–15.5)
WBC: 6.2 10*3/uL (ref 4.0–10.5)

## 2015-05-16 LAB — BASIC METABOLIC PANEL
BUN: 22 mg/dL (ref 7–25)
CO2: 28 mmol/L (ref 20–31)
Calcium: 9.4 mg/dL (ref 8.6–10.3)
Chloride: 103 mmol/L (ref 98–110)
Creat: 0.77 mg/dL (ref 0.70–1.25)
Glucose, Bld: 91 mg/dL (ref 65–99)
Potassium: 4.6 mmol/L (ref 3.5–5.3)
Sodium: 138 mmol/L (ref 135–146)

## 2015-05-16 NOTE — Patient Instructions (Signed)

## 2015-05-16 NOTE — Progress Notes (Signed)
Pt was started on Xarelto 20mg  daily for DVT on 04/13/15 by Dr Everlene Farrier.    Reviewed patients medication list.  Pt is not currently on any combined P-gp and strong CYP3A4 inhibitors/inducers (ketoconazole, traconazole, ritonavir, carbamazepine, phenytoin, rifampin, St. John's wort).  Reviewed labs.  SCr 0.77, Weight 154.5kg, CrCl- 197.86.  Dose appropriate based on CrCl.   Hgb and HCT 13.2/38.9 on 05/16/15, stable.  Called spoke with pt advised labs stable, continue on same dosage of Xarelto 20mg  daily.  Repeat labs/6 month Xarelto f/u on 11/28/15 at 9am.

## 2015-05-17 ENCOUNTER — Other Ambulatory Visit: Payer: Self-pay | Admitting: *Deleted

## 2015-05-17 DIAGNOSIS — I82409 Acute embolism and thrombosis of unspecified deep veins of unspecified lower extremity: Secondary | ICD-10-CM

## 2015-05-17 DIAGNOSIS — M7989 Other specified soft tissue disorders: Secondary | ICD-10-CM

## 2015-05-18 ENCOUNTER — Encounter: Payer: Self-pay | Admitting: Vascular Surgery

## 2015-05-18 ENCOUNTER — Ambulatory Visit (HOSPITAL_COMMUNITY)
Admission: RE | Admit: 2015-05-18 | Discharge: 2015-05-18 | Disposition: A | Discharge status: Home / Self Care | Payer: Medicare Other | Source: Ambulatory Visit | Attending: Vascular Surgery | Admitting: Vascular Surgery

## 2015-05-18 ENCOUNTER — Ambulatory Visit (INDEPENDENT_AMBULATORY_CARE_PROVIDER_SITE_OTHER): Payer: Medicare Other | Admitting: Vascular Surgery

## 2015-05-18 VITALS — BP 138/88 | HR 67 | Ht 70.5 in | Wt 339.0 lb

## 2015-05-18 DIAGNOSIS — I82409 Acute embolism and thrombosis of unspecified deep veins of unspecified lower extremity: Secondary | ICD-10-CM | POA: Diagnosis not present

## 2015-05-18 DIAGNOSIS — I82431 Acute embolism and thrombosis of right popliteal vein: Secondary | ICD-10-CM | POA: Insufficient documentation

## 2015-05-18 DIAGNOSIS — I8393 Asymptomatic varicose veins of bilateral lower extremities: Secondary | ICD-10-CM | POA: Diagnosis not present

## 2015-05-18 DIAGNOSIS — I872 Venous insufficiency (chronic) (peripheral): Secondary | ICD-10-CM

## 2015-05-18 DIAGNOSIS — M7989 Other specified soft tissue disorders: Secondary | ICD-10-CM | POA: Insufficient documentation

## 2015-05-18 NOTE — Progress Notes (Signed)
[facility] [place]  Patient name: Marc Mills MRN: [id number] DOB: 30-Jan-1946 Sex: male  REASON FOR CONSULT: Venous insufficiency with cellulitis. Referred by Dr. Everlene Farrier.  HPI: Marc Mills is a 70 y.o. male, who I have seen before with chronic venous insufficiency. He has a long history of venous disease with history of multiple previous DVTs and also to previous pulmonary emboli. He has an IVC filter. He is on long-term Xeralto. On the past he had been on Coumadin. He was hospitalized in November 2016 with cellulitis and has been on doxycycline for 2 months which he recently completed. He had significant leg swelling during this episode but his swelling has significantly improved. He does wear knee-high compression stockings with a gradient of 15-20 mmHg. He also recently baught himself a leg pump machine.  I have reviewed the records from Dr. Perfecto Kingdom office. He has a history of varicose veins, pulmonary embolus, and deep venous thrombosis. He has had cellulitis in the right leg. This has been treated with doxycycline. He comes in for evaluation of venous disease.  Past Medical History  Diagnosis Date  . Morbid obesity ([place])   . History of pulmonary embolus ([facility])   . History of lymphoma   . Incisional hernia   . Arthritis   . Blood transfusion without reported diagnosis   . DVT (deep venous thrombosis) ([place])     2011 right leg  . Anorectal fistula   . OSA on CPAP     cpap  10 yrs  . Varicose veins of lower extremities     pe  . Cancer ([facility])     lymphoma hx  . Cataract   . Cellulitis 04/04/2013    Family History  Problem Relation Age of Onset  . Heart disease Father   . Varicose Veins Father   . Heart attack Father     dx. 52s  . Heart disease Mother 44  . Varicose Veins Mother   . Pancreatic cancer Sister 49  . Cancer Maternal Grandmother     either stomach or pancreatic primary   . Pancreatic cancer Maternal Grandfather     dx. 57s  .  Lung cancer Maternal Aunt     secondhand smoke exposure  . Heart attack Paternal Uncle   . Rectal cancer Maternal Aunt     dx. 50s  . Cancer Maternal Aunt     unknown type  . Diabetes Paternal Uncle   . Cancer Cousin     unknown type  . Breast cancer Cousin     dx. 15s    SOCIAL HISTORY: Social History   Social History  . Marital Status: Married    Spouse Name: [name]  . Number of Children: [name]  . Years of Education: [name]   Occupational History  . Not on file.   Social History Main Topics  . Smoking status: Former Smoker -- 2.00 packs/day for 20 years    Types: Cigarettes    Quit date: 05/05/1984  . Smokeless tobacco: Never Used  . Alcohol Use: 0.0 oz/week    0 Standard drinks or equivalent per week     Comment: maybe 1 beer/month  . Drug Use: No  . Sexual Activity: Yes    Birth Control/ Protection: None   Other Topics Concern  . Not on file   Social History Narrative    Allergies  Allergen Reactions  . Hydrocodone Itching and Other (See Comments)    Severe dizziness, Gives me  chills  . Oxycodone Itching and Other (See Comments)    Severe dizziness, Gives me chills    Current Outpatient Prescriptions  Medication Sig Dispense Refill  . acetaminophen (TYLENOL) 500 MG tablet Take 1,000 mg by mouth every 6 (six) hours as needed for mild pain.    Marland Kitchen b complex vitamins tablet Take 1 tablet by mouth every morning.     . calcium carbonate (OS-CAL) 600 MG TABS Take 600 mg by mouth every morning.     . cholecalciferol (VITAMIN D) 1000 UNITS tablet Take 1,000 Units by mouth every morning.     . citalopram (CELEXA) 20 MG tablet Take 1 tablet (20 mg total) by mouth daily. 30 tablet 5  . doxycycline (VIBRAMYCIN) 100 MG capsule Take 1 capsule (100 mg total) by mouth 2 (two) times daily. 40 capsule 0  . Magnesium 400 MG CAPS Take 400 mg by mouth daily.     . Multiple Vitamin (THERAGRAN PO) Take 1 tablet by mouth daily.    . Nutritional Supplements (SALMON OIL) CAPS Take 1  capsule by mouth daily.    . Rivaroxaban (XARELTO STARTER PACK) 15 & 20 MG TBPK Take as directed on package. 51 each 0  . rivaroxaban (XARELTO) 20 MG TABS tablet Take 1 tablet (20 mg total) by mouth daily with supper. 30 tablet 11  . sildenafil (VIAGRA) 100 MG tablet Take 1 tablet (100 mg total) by mouth daily as needed for erectile dysfunction. Splits the tablet in half prn 10 tablet 11   No current facility-administered medications for this visit.    REVIEW OF SYSTEMS:  [X]  denotes positive finding, [ ]  denotes negative finding Cardiac  Comments:  Chest pain or chest pressure:    Shortness of breath upon exertion:    Short of breath when lying flat:    Irregular heart rhythm:        Vascular    Pain in calf, thigh, or hip brought on by ambulation:    Pain in feet at night that wakes you up from your sleep:     Blood clot in your veins: X   Leg swelling:  X       Pulmonary    Oxygen at home:    Productive cough:     Wheezing:         Neurologic    Sudden weakness in arms or legs:     Sudden numbness in arms or legs:     Sudden onset of difficulty speaking or slurred speech:    Temporary loss of vision in one eye:     Problems with dizziness:         Gastrointestinal    Blood in stool:     Vomited blood:         Genitourinary    Burning when urinating:     Blood in urine:        Psychiatric    Major depression:         Hematologic    Bleeding problems:    Problems with blood clotting too easily:        Skin    Rashes or ulcers:        Constitutional    Fever or chills:      PHYSICAL EXAM: Filed Vitals:   05/18/15 1618  BP: 138/88  Pulse: 67  Height: 5' 10.5" (1.791 m)  Weight: 339 lb (153.769 kg)  SpO2: 95%    GENERAL: The patient is a well-nourished male,  in no acute distress. The vital signs are documented above. CARDIAC: There is a regular rate and rhythm.  VASCULAR: I do not detect carotid bruits. He has palpable femoral pulses. He has a  palpable right dorsalis pedis pulse in the left posterior tibial pulse. He has significant bilateral lower extremity swelling which is more significant on the right side. PULMONARY: There is good air exchange bilaterally without wheezing or rales. ABDOMEN: Soft and non-tender with normal pitched bowel sounds.  MUSCULOSKELETAL: There are no major deformities or cyanosis. NEUROLOGIC: No focal weakness or paresthesias are detected. SKIN: He has hyperpigmentation bilaterally consistent with chronic venous insufficiency. PSYCHIATRIC: The patient has a normal affect.  DATA:   LOWER EXTREMITY VENOUS DUPLEX: I have independently interpreted the lower extremity venous duplex of the right lower extremity. There is no evidence of DVT in the right lower extremity. There is some partially occlusive thrombus in the right gastrocnemius vein. There is no evidence of superficial thrombophlebitis. There is significant venous incompetence in the right great saphenous vein and also throughout the deep system on the right including the common femoral vein, femoral vein and popliteal vein.  MEDICAL ISSUES:  CHRONIC VENOUS INSUFFICIENCY: The patient has a long history of chronic venous insufficiency and venous thromboembolic disease. He is maintained on long-term Xeralto. We have again discussed the importance of intermittent leg elevation in the proper positioning for this. He will continue to wear his compression stockings. I've encouraged him to avoid prolonged sitting and standing and to stay as active as possible. He will continue swimming which is also helpful for people with venous disease. Based on his duplex scan, if his symptoms progressed he could be placed in thigh-high stockings with a gradient of 20-30 mmHg. If this were not effective he can be considered for laser ablation of the right great saphenous vein. He will call if his symptoms progress. Plan on seeing him back as needed.   Deitra Mayo [facility] [place]: [phone]

## 2015-05-26 ENCOUNTER — Other Ambulatory Visit: Payer: Self-pay | Admitting: Emergency Medicine

## 2015-05-26 DIAGNOSIS — L03115 Cellulitis of right lower limb: Secondary | ICD-10-CM

## 2015-05-26 MED ORDER — DOXYCYCLINE HYCLATE 100 MG PO CAPS: 100.0000 mg | ORAL_CAPSULE | Freq: Two times a day (BID) | ORAL | Status: DC

## 2015-05-30 DIAGNOSIS — M19071 Primary osteoarthritis, right ankle and foot: Secondary | ICD-10-CM | POA: Diagnosis not present

## 2015-05-30 DIAGNOSIS — M1712 Unilateral primary osteoarthritis, left knee: Secondary | ICD-10-CM | POA: Diagnosis not present

## 2015-07-04 ENCOUNTER — Other Ambulatory Visit: Payer: Self-pay | Admitting: Emergency Medicine

## 2015-07-05 NOTE — Telephone Encounter (Signed)
Dr Everlene Farrier, ok to refill? He has been in the office but didn't discuss Celexa?

## 2015-07-14 ENCOUNTER — Other Ambulatory Visit: Payer: Self-pay | Admitting: Emergency Medicine

## 2015-08-13 ENCOUNTER — Ambulatory Visit (INDEPENDENT_AMBULATORY_CARE_PROVIDER_SITE_OTHER): Payer: Medicare Other | Admitting: Family Medicine

## 2015-08-13 VITALS — BP 139/82 | HR 70 | Temp 97.5°F | Resp 16 | Ht 71.0 in | Wt 339.0 lb

## 2015-08-13 DIAGNOSIS — H6122 Impacted cerumen, left ear: Secondary | ICD-10-CM | POA: Diagnosis not present

## 2015-08-13 DIAGNOSIS — M7989 Other specified soft tissue disorders: Secondary | ICD-10-CM | POA: Diagnosis not present

## 2015-08-13 DIAGNOSIS — S00412A Abrasion of left ear, initial encounter: Secondary | ICD-10-CM

## 2015-08-13 DIAGNOSIS — Z872 Personal history of diseases of the skin and subcutaneous tissue: Secondary | ICD-10-CM | POA: Diagnosis not present

## 2015-08-13 MED ORDER — NEOMYCIN-POLYMYXIN-HC 3.5-10000-1 OT SOLN: 4.0000 [drp] | Freq: Three times a day (TID) | OTIC | Status: DC

## 2015-08-13 MED ORDER — DOXYCYCLINE HYCLATE 100 MG PO TABS: 100.0000 mg | ORAL_TABLET | Freq: Two times a day (BID) | ORAL | Status: DC

## 2015-08-13 NOTE — Patient Instructions (Addendum)
IF you received an x-ray today, you will receive an invoice from [facility]. Please contact [facility] at [phone] with questions or concerns regarding your invoice.   IF you received labwork today, you will receive an invoice from [employer]. Please contact [facility] at [phone] with questions or concerns regarding your invoice.   Our billing staff will not be able to assist you with questions regarding bills from these companies.  You will be contacted with the lab results as soon as they are available. The fastest way to get your results is to activate your My Chart account. Instructions are located on the last page of this paperwork. If you have not heard from [place] regarding the results in 2 weeks, please contact this office.    Your toe may be an early infection or as she had noted some swelling from pressure in the shoe. Try elevating, ice over area if needed, but if any increased redness or persistent swelling in the next 2 days, can start antibiotic.Return to the clinic or go to the nearest emergency room if any of your symptoms worsen or new symptoms occur.  Your ear canal does look a little bit red or irritated after the lavage, with possible slight abrasion from removing the ear wax. I do not see any active bleeding or other injury. If you have any soreness in the ear tomorrow morning, start the antibiotic ear drops. If any change in hearing, or worsening symptoms, recommend recheck here or other medical provider.  Cerumen Impaction The structures of the external ear canal secrete a waxy substance known as cerumen. Excess cerumen can build up in the ear canal, causing a condition known as cerumen impaction. Cerumen impaction can cause ear pain and disrupt the function of the ear. The rate of cerumen production differs for each individual. In certain individuals, the configuration of the ear canal may decrease his or her ability to  naturally remove cerumen. CAUSES Cerumen impaction is caused by excessive cerumen production or buildup. RISK FACTORS  Frequent use of swabs to clean ears.  Having narrow ear canals.  Having eczema.  Being dehydrated. SIGNS AND SYMPTOMS  Diminished hearing.  Ear drainage.  Ear pain.  Ear itch. TREATMENT Treatment may involve:  Over-the-counter or prescription ear drops to soften the cerumen.  Removal of cerumen by a health care provider. This may be done with:  Irrigation with warm water. This is the most common method of removal.  Ear curettes and other instruments.  Surgery. This may be done in severe cases. HOME CARE INSTRUCTIONS  Take medicines only as directed by your health care provider.  Do not insert objects into the ear with the intent of cleaning the ear. PREVENTION  Do not insert objects into the ear, even with the intent of cleaning the ear. Removing cerumen as a part of normal hygiene is not necessary, and the use of swabs in the ear canal is not recommended.  Drink enough water to keep your urine clear or pale yellow.  Control your eczema if you have it. SEEK MEDICAL CARE IF:  You develop ear pain.  You develop bleeding from the ear.  The cerumen does not clear after you use ear drops as directed.   This information is not intended to replace advice given to you by your health care provider. Make sure you discuss any questions you have with your health care provider.   Document Released: 05/29/2004 Document Revised: 05/12/2014 Document Reviewed:  12/06/2014 Elsevier Interactive Patient Education 2016 [employer]  Cellulitis Cellulitis is an infection of the skin and the tissue beneath it. The infected area is usually red and tender. Cellulitis occurs most often in the arms and lower legs.  CAUSES  Cellulitis is caused by bacteria that enter the skin through cracks or cuts in the skin. The most common types of bacteria that cause cellulitis  are staphylococci and streptococci. SIGNS AND SYMPTOMS   Redness and warmth.  Swelling.  Tenderness or pain.  Fever. DIAGNOSIS  Your health care provider can usually determine what is wrong based on a physical exam. Blood tests may also be done. TREATMENT  Treatment usually involves taking an antibiotic medicine. HOME CARE INSTRUCTIONS   Take your antibiotic medicine as directed by your health care provider. Finish the antibiotic even if you start to feel better.  Keep the infected arm or leg elevated to reduce swelling.  Apply a warm cloth to the affected area up to 4 times per day to relieve pain.  Take medicines only as directed by your health care provider.  Keep all follow-up visits as directed by your health care provider. SEEK MEDICAL CARE IF:   You notice red streaks coming from the infected area.  Your red area gets larger or turns dark in color.  Your bone or joint underneath the infected area becomes painful after the skin has healed.  Your infection returns in the same area or another area.  You notice a swollen bump in the infected area.  You develop new symptoms.  You have a fever. SEEK IMMEDIATE MEDICAL CARE IF:   You feel very sleepy.  You develop vomiting or diarrhea.  You have a general ill feeling (malaise) with muscle aches and pains.   This information is not intended to replace advice given to you by your health care provider. Make sure you discuss any questions you have with your health care provider.   Document Released: 01/29/2005 Document Revised: 01/10/2015 Document Reviewed: 07/07/2011 Elsevier Interactive Patient Education [employer].

## 2015-08-13 NOTE — Progress Notes (Signed)
Subjective:  By signing my name below, I, Marc Mills, attest that this documentation has been prepared under the direction and in the presence of Marc Agreste, MD Electronically Signed: Ladene Artist, ED Scribe 08/13/2015 at 5:07 PM.   Patient ID: Marc Mills, male    DOB: August 02, 1945, 70 y.o.   MRN: [id number]  Chief Complaint  Patient presents with  . Ear Problem    left, pt says it is clogged, x 3 days   . toe problem    left big toe , burning x 1 week   HPI  HPI Comments: Marc Mills is a 70 y.o. male, with a h/o multiple medical problems, see problem list, who presents to the [facility] complaining of a clogged left ear for the past 3 days. Pt was recently seen by his audiologist for hearing aids who noticed cerumen impaction. He has tried Peroxide and a bulb for the past few days without significant relief. Pt denies ear pain. He reports h/o of impaction 1 year ago.   Left Great Toe  Pt presents with gradually worsening left great toe burning pain for the past week. He reports associated redness and puffiness to the left great toe for the past week as well. He suspects that symptoms might be due to his great toe rubbing his shoes; recently ordered a new pair. Pt express concerns due to h/o lower extremity cellulitis which has been treated with doxycycline in the past. No h/o gout.  Patient Active Problem List   Diagnosis Date Noted  . History of pulmonary embolism   . History of DVT (deep vein thrombosis)   . Pyrexia   . Cellulitis 03/18/2015  . Genetic testing 01/12/2015  . Postoperative wound infection 12/09/2013  . Postop check 11/24/2013  . Ulcer of lower limb, unspecified 08/24/2013  . Inguinal lymphadenopathy 08/16/2013  . Encounter for therapeutic drug monitoring 06/09/2013  . Cellulitis of left lower extremity 04/04/2013  . Osteoarthritis of left knee 11/02/2012  . COPD, mild ([place]) 10/18/2012  . Cellulitis of right lower  extremity 04/30/2012  . 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 Status post splenectomy 04/29/2012  . Incisional hernia 05/27/2011  . Splenic marginal zone b-cell lymphoma ([place]) 04/16/2010  . Morbid obesity ([place]) 04/19/2008  . Obstructive sleep apnea 04/19/2008  . VARICOSE VEINS, LOWER EXTREMITIES 04/19/2008  . PULMONARY EMBOLISM, HX OF 04/19/2008   Past Medical History  Diagnosis Date  . Morbid obesity ([place])   . History of pulmonary embolus (PE)   . History of lymphoma   . Incisional hernia   . Arthritis   . Blood transfusion without reported diagnosis   . DVT (deep venous thrombosis) ([place])     2011 right leg  . Anorectal fistula   . OSA on CPAP     cpap  10 yrs  . Varicose veins of lower extremities     pe  . Cancer (HCC)     lymphoma hx  . Cataract   . Cellulitis 04/04/2013   Past Surgical History  Procedure Laterality Date  . Splenectomy    . Prostate surgery      (patient denies)  . Ivc filter    . Tonsillectomy      age 74  . Eye surgery Left 03    cat, detached ret    . Hernia repair    . Total knee arthroplasty Left 11/02/2012    Procedure: TOTAL KNEE ARTHROPLASTY with revision tibia;  Surgeon: Garald Balding, MD;  Location: [facility];  Service: Orthopedics;  Laterality: Left;  . Fracture surgery    . Inguinal lymph node biopsy [name] 11/15/2013    Procedure: RIGHT INGUINAL LYMPH NODE BIOPSY;  Surgeon: Marc Ober, MD;  Location: [facility];  Service: General;  Laterality: [name];   Allergies  Allergen Reactions  . Hydrocodone Itching and Other (See Comments)    Severe dizziness, Gives me chills  . Oxycodone Itching and Other (See Comments)    Severe dizziness, Gives me chills   Prior to Admission medications   Medication Sig Start Date End Date Taking? Authorizing Provider  acetaminophen (TYLENOL) 500 MG tablet Take 1,000 mg by mouth every 6 (six) hours as needed for mild pain.   Yes Historical Provider, MD  b complex vitamins tablet Take 1 tablet by mouth  every morning.    Yes Historical Provider, MD  calcium carbonate (OS-CAL) 600 MG TABS Take 600 mg by mouth every morning.    Yes Historical Provider, MD  cholecalciferol (VITAMIN D) 1000 UNITS tablet Take 1,000 Units by mouth every morning.    Yes Historical Provider, MD  citalopram (CELEXA) 20 MG tablet Take 1 tablet (20 mg total) by mouth daily. 05/02/14  Yes Marc Russian, MD  Magnesium 400 MG CAPS Take 400 mg by mouth daily.    Yes Historical Provider, MD  Multiple Vitamin (THERAGRAN PO) Take 1 tablet by mouth daily.   Yes Historical Provider, MD  Nutritional Supplements (SALMON OIL) CAPS Take 1 capsule by mouth daily.   Yes Historical Provider, MD  rivaroxaban (XARELTO) 20 MG TABS tablet Take 1 tablet (20 mg total) by mouth daily with supper. 04/13/15  Yes Marc Russian, MD  VIAGRA 100 MG tablet TAKE 1 TABLET BY MOUTH EVERY DAY AS NEEDED FOR ERECTILE DYSFUNCTION . SPLIT TABLET IN HALF AS NEEDED 07/15/15  Yes Marc Mons, PA-C   Social History   Social History  . Marital Status: Married    Spouse Name: [name]  . Number of Children: [name]  . Years of Education: [name]   Occupational History  . Not on file.   Social History Main Topics  . Smoking status: Former Smoker -- 2.00 packs/day for 20 years    Types: Cigarettes    Quit date: 05/05/1984  . Smokeless tobacco: Never Used  . Alcohol Use: 0.0 oz/week    0 Standard drinks or equivalent per week     Comment: maybe 1 beer/month  . Drug Use: No  . Sexual Activity: Yes    Birth Control/ Protection: None   Other Topics Concern  . Not on file   Social History Narrative   Review of Systems  Constitutional: Negative for fatigue and unexpected weight change.  HENT: Negative for ear pain.   Eyes: Negative for visual disturbance.  Respiratory: Negative for cough, chest tightness and shortness of breath.   Cardiovascular: Negative for chest pain, palpitations and leg swelling.  Gastrointestinal: Negative for abdominal pain and blood in  stool.  Skin: Positive for color change.  Neurological: Negative for dizziness, light-headedness and headaches.      Objective:   Physical Exam  Constitutional: He is oriented to person, place, and time. He appears well-developed and well-nourished.  HENT:  Head: Normocephalic and atraumatic.  L ear with yellow cerumen impaction deep in the canal, unable to visualize the TM. R TM is clear with very small amount of cerumen.   Eyes: EOM are normal. Pupils are equal, round,  and reactive to light.  Neck: No JVD present. Carotid bruit is not present.  Cardiovascular: Normal rate, regular rhythm and normal heart sounds.   No murmur heard. Pulmonary/Chest: Effort normal and breath sounds normal. He has no rales.  Musculoskeletal: He exhibits no edema.  L great toe is thickened with a discolored nail but trimmed short. Callus at the medial IP. Skin is intact. Slight soft tissue swelling of the proximal nail fold. L great toe nail fold is more swollen than the R side but no open wound seen.   Neurological: He is alert and oriented to person, place, and time.  Skin: Skin is warm and dry.  Psychiatric: He has a normal mood and affect.  Vitals reviewed.  Multiple times at lavage, then large pieces removed removed with white loop curette to relief impaction. Tolerated without difficulty, TM appeared intact, minimal erythema possible slight abrasion of canal from cerumen removal, but no active bleeding or other injury noted.    Assessment & Plan:   AYVIN LIPINSKI is a 70 y.o. male Cerumen impaction, left - Plan: Ear wax removal, neomycin-polymyxin-hydrocortisone (CORTISPORIN) otic solution Ear canal abrasion, left, initial encounter - Plan: neomycin-polymyxin-hydrocortisone (CORTISPORIN) otic solution  -Cerumen removed with lavage and manual disimpaction by myself as above. Small remaining erythema may be from lavage, but if pain in the morning, can start Cortisporin Otic drops. RTC precautions  given.  Toe swelling - Plan: doxycycline (VIBRA-TABS) 100 MG tablet History of cellulitis - Plan: doxycycline (VIBRA-TABS) 100 MG tablet  -May be irritation from rubbing shoe, but with slight erythema and swelling, possible early cellulitis and plan on going out of town in a few days. Prescription for doxycycline given if not improving with elevation and symptomatic care, RTC precautions.  Meds ordered this encounter  Medications  . doxycycline (VIBRA-TABS) 100 MG tablet    Sig: Take 1 tablet (100 mg total) by mouth 2 (two) times daily.    Dispense:  20 tablet    Refill:  0  . neomycin-polymyxin-hydrocortisone (CORTISPORIN) otic solution    Sig: Place 4 drops into the left ear 3 (three) times daily.    Dispense:  10 mL    Refill:  0   Patient Instructions       IF you received an x-ray today, you will receive an invoice from [facility]. Please contact [facility] at [phone] with questions or concerns regarding your invoice.   IF you received labwork today, you will receive an invoice from [employer]. Please contact [facility] at [phone] with questions or concerns regarding your invoice.   Our billing staff will not be able to assist you with questions regarding bills from these companies.  You will be contacted with the lab results as soon as they are available. The fastest way to get your results is to activate your My Chart account. Instructions are located on the last page of this paperwork. If you have not heard from [place] regarding the results in 2 weeks, please contact this office.    Your toe may be an early infection or as she had noted some swelling from pressure in the shoe. Try elevating, ice over area if needed, but if any increased redness or persistent swelling in the next 2 days, can start antibiotic.Return to the clinic or go to the nearest emergency room if any of your symptoms worsen or new symptoms occur.  Your  ear canal does look a little bit red or irritated after the lavage,  with possible slight abrasion from removing the ear wax. I do not see any active bleeding or other injury. If you have any soreness in the ear tomorrow morning, start the antibiotic ear drops. If any change in hearing, or worsening symptoms, recommend recheck here or other medical provider.  Cerumen Impaction The structures of the external ear canal secrete a waxy substance known as cerumen. Excess cerumen can build up in the ear canal, causing a condition known as cerumen impaction. Cerumen impaction can cause ear pain and disrupt the function of the ear. The rate of cerumen production differs for each individual. In certain individuals, the configuration of the ear canal may decrease his or her ability to naturally remove cerumen. CAUSES Cerumen impaction is caused by excessive cerumen production or buildup. RISK FACTORS  Frequent use of swabs to clean ears.  Having narrow ear canals.  Having eczema.  Being dehydrated. SIGNS AND SYMPTOMS  Diminished hearing.  Ear drainage.  Ear pain.  Ear itch. TREATMENT Treatment may involve:  Over-the-counter or prescription ear drops to soften the cerumen.  Removal of cerumen by a health care provider. This may be done with:  Irrigation with warm water. This is the most common method of removal.  Ear curettes and other instruments.  Surgery. This may be done in severe cases. HOME CARE INSTRUCTIONS  Take medicines only as directed by your health care provider.  Do not insert objects into the ear with the intent of cleaning the ear. PREVENTION  Do not insert objects into the ear, even with the intent of cleaning the ear. Removing cerumen as a part of normal hygiene is not necessary, and the use of swabs in the ear canal is not recommended.  Drink enough water to keep your urine clear or pale yellow.  Control your eczema if you have it. SEEK MEDICAL CARE IF:  You  develop ear pain.  You develop bleeding from the ear.  The cerumen does not clear after you use ear drops as directed.   This information is not intended to replace advice given to you by your health care provider. Make sure you discuss any questions you have with your health care provider.   Document Released: 05/29/2004 Document Revised: 05/12/2014 Document Reviewed: 12/06/2014 Elsevier Interactive Patient Education 2016 Else[employer]  Cellulitis Cellulitis is an infection of the skin and the tissue beneath it. The infected area is usually red and tender. Cellulitis occurs most often in the arms and lower legs.  CAUSES  Cellulitis is caused by bacteria that enter the skin through cracks or cuts in the skin. The most common types of bacteria that cause cellulitis are staphylococci and streptococci. SIGNS AND SYMPTOMS   Redness and warmth.  Swelling.  Tenderness or pain.  Fever. DIAGNOSIS  Your health care provider can usually determine what is wrong based on a physical exam. Blood tests may also be done. TREATMENT  Treatment usually involves taking an antibiotic medicine. HOME CARE INSTRUCTIONS   Take your antibiotic medicine as directed by your health care provider. Finish the antibiotic even if you start to feel better.  Keep the infected arm or leg elevated to reduce swelling.  Apply a warm cloth to the affected area up to 4 times per day to relieve pain.  Take medicines only as directed by your health care provider.  Keep all follow-up visits as directed by your health care provider. SEEK MEDICAL CARE IF:   You notice red streaks coming from the infected area.  Your red area gets larger or turns dark in color.  Your bone or joint underneath the infected area becomes painful after the skin has healed.  Your infection returns in the same area or another area.  You notice a swollen bump in the infected area.  You develop new symptoms.  You have a fever. SEEK  IMMEDIATE MEDICAL CARE IF:   You feel very sleepy.  You develop vomiting or diarrhea.  You have a general ill feeling (malaise) with muscle aches and pains.   This information is not intended to replace advice given to you by your health care provider. Make sure you discuss any questions you have with your health care provider.   Document Released: 01/29/2005 Document Revised: 01/10/2015 Document Reviewed: 07/07/2011 Elsevier Interactive Patient Education [employer].     I personally performed the services described in this documentation, which was scribed in my presence. The recorded information has been reviewed and considered, and addended by me as needed.

## 2015-08-31 ENCOUNTER — Inpatient Hospital Stay (HOSPITAL_COMMUNITY)
Admission: EM | Admit: 2015-08-31 | Discharge: 2015-09-02 | DRG: 872 | Disposition: A | Discharge status: Home / Self Care | Payer: Medicare Other | Attending: Family Medicine | Admitting: Family Medicine

## 2015-08-31 ENCOUNTER — Emergency Department (HOSPITAL_COMMUNITY): Payer: Medicare Other

## 2015-08-31 ENCOUNTER — Encounter (HOSPITAL_COMMUNITY): Payer: Self-pay | Admitting: *Deleted

## 2015-08-31 DIAGNOSIS — Z96652 Presence of left artificial knee joint: Secondary | ICD-10-CM | POA: Diagnosis present

## 2015-08-31 DIAGNOSIS — Z87891 Personal history of nicotine dependence: Secondary | ICD-10-CM | POA: Diagnosis not present

## 2015-08-31 DIAGNOSIS — G4733 Obstructive sleep apnea (adult) (pediatric): Secondary | ICD-10-CM | POA: Diagnosis not present

## 2015-08-31 DIAGNOSIS — Z7901 Long term (current) use of anticoagulants: Secondary | ICD-10-CM | POA: Diagnosis not present

## 2015-08-31 DIAGNOSIS — C8330 Diffuse large B-cell lymphoma, unspecified site: Secondary | ICD-10-CM | POA: Diagnosis present

## 2015-08-31 DIAGNOSIS — M7989 Other specified soft tissue disorders: Secondary | ICD-10-CM

## 2015-08-31 DIAGNOSIS — Z833 Family history of diabetes mellitus: Secondary | ICD-10-CM

## 2015-08-31 DIAGNOSIS — Z86711 Personal history of pulmonary embolism: Secondary | ICD-10-CM

## 2015-08-31 DIAGNOSIS — Z6841 Body Mass Index (BMI) 40.0 and over, adult: Secondary | ICD-10-CM | POA: Diagnosis not present

## 2015-08-31 DIAGNOSIS — Z79899 Other long term (current) drug therapy: Secondary | ICD-10-CM

## 2015-08-31 DIAGNOSIS — Z872 Personal history of diseases of the skin and subcutaneous tissue: Secondary | ICD-10-CM

## 2015-08-31 DIAGNOSIS — Z86718 Personal history of other venous thrombosis and embolism: Secondary | ICD-10-CM | POA: Diagnosis not present

## 2015-08-31 DIAGNOSIS — L03119 Cellulitis of unspecified part of limb: Secondary | ICD-10-CM

## 2015-08-31 DIAGNOSIS — L03115 Cellulitis of right lower limb: Secondary | ICD-10-CM | POA: Diagnosis not present

## 2015-08-31 DIAGNOSIS — R509 Fever, unspecified: Secondary | ICD-10-CM | POA: Diagnosis not present

## 2015-08-31 DIAGNOSIS — Z801 Family history of malignant neoplasm of trachea, bronchus and lung: Secondary | ICD-10-CM

## 2015-08-31 DIAGNOSIS — Z8249 Family history of ischemic heart disease and other diseases of the circulatory system: Secondary | ICD-10-CM

## 2015-08-31 DIAGNOSIS — A419 Sepsis, unspecified organism: Secondary | ICD-10-CM

## 2015-08-31 DIAGNOSIS — J449 Chronic obstructive pulmonary disease, unspecified: Secondary | ICD-10-CM | POA: Diagnosis present

## 2015-08-31 DIAGNOSIS — H6122 Impacted cerumen, left ear: Secondary | ICD-10-CM | POA: Diagnosis present

## 2015-08-31 DIAGNOSIS — Z9081 Acquired absence of spleen: Secondary | ICD-10-CM

## 2015-08-31 DIAGNOSIS — L03116 Cellulitis of left lower limb: Secondary | ICD-10-CM | POA: Diagnosis not present

## 2015-08-31 DIAGNOSIS — D72829 Elevated white blood cell count, unspecified: Secondary | ICD-10-CM | POA: Diagnosis present

## 2015-08-31 DIAGNOSIS — Z800 Family history of malignant neoplasm of digestive organs: Secondary | ICD-10-CM | POA: Diagnosis not present

## 2015-08-31 DIAGNOSIS — I2699 Other pulmonary embolism without acute cor pulmonale: Secondary | ICD-10-CM | POA: Diagnosis present

## 2015-08-31 DIAGNOSIS — Z885 Allergy status to narcotic agent status: Secondary | ICD-10-CM | POA: Diagnosis not present

## 2015-08-31 HISTORY — DX: Sepsis, unspecified organism: A41.9

## 2015-08-31 LAB — COMPREHENSIVE METABOLIC PANEL
ALT: 21 U/L (ref 17–63)
AST: 24 U/L (ref 15–41)
Albumin: 4.1 g/dL (ref 3.5–5.0)
Alkaline Phosphatase: 58 U/L (ref 38–126)
Anion gap: 10 (ref 5–15)
BUN: 18 mg/dL (ref 6–20)
CO2: 24 mmol/L (ref 22–32)
Calcium: 9.2 mg/dL (ref 8.9–10.3)
Chloride: 103 mmol/L (ref 101–111)
Creatinine, Ser: 0.96 mg/dL (ref 0.61–1.24)
GFR calc Af Amer: >60 mL/min (ref >60)
GFR calc non Af Amer: >60 mL/min (ref >60)
Glucose, Bld: 130 mg/dL — ABNORMAL HIGH (ref 65–99)
Potassium: 4.4 mmol/L (ref 3.5–5.1)
Sodium: 137 mmol/L (ref 135–145)
Total Bilirubin: 0.6 mg/dL (ref 0.3–1.2)
Total Protein: 7.2 g/dL (ref 6.5–8.1)

## 2015-08-31 LAB — CBC WITH DIFFERENTIAL/PLATELET
Basophils Absolute: 0 10*3/uL (ref 0.0–0.1)
Basophils Relative: 0 %
Eosinophils Absolute: 0 10*3/uL (ref 0.0–0.7)
Eosinophils Relative: 0 %
HCT: 39.7 % (ref 39.0–52.0)
Hemoglobin: 13.6 g/dL (ref 13.0–17.0)
Lymphocytes Relative: 4 %
Lymphs Abs: 0.9 10*3/uL (ref 0.7–4.0)
MCH: 30.4 pg (ref 26.0–34.0)
MCHC: 34.3 g/dL (ref 30.0–36.0)
MCV: 88.6 fL (ref 78.0–100.0)
Monocytes Absolute: 1.5 10*3/uL — ABNORMAL HIGH (ref 0.1–1.0)
Monocytes Relative: 7 %
Neutro Abs: 19.2 10*3/uL — ABNORMAL HIGH (ref 1.7–7.7)
Neutrophils Relative %: 89 %
Platelets: 302 10*3/uL (ref 150–400)
RBC: 4.48 MIL/uL (ref 4.22–5.81)
RDW: 14.6 % (ref 11.5–15.5)
WBC: 21.6 10*3/uL — ABNORMAL HIGH (ref 4.0–10.5)

## 2015-08-31 LAB — URINALYSIS, ROUTINE W REFLEX MICROSCOPIC
Bilirubin Urine: NEGATIVE
Glucose, UA: NEGATIVE mg/dL
Ketones, ur: NEGATIVE mg/dL
Leukocytes, UA: NEGATIVE
Nitrite: NEGATIVE
Protein, ur: NEGATIVE mg/dL
Specific Gravity, Urine: 1.015 (ref 1.005–1.030)
pH: 7 (ref 5.0–8.0)

## 2015-08-31 LAB — URINE MICROSCOPIC-ADD ON
Bacteria, UA: NONE SEEN
Squamous Epithelial / LPF: NONE SEEN
WBC, UA: NONE SEEN WBC/hpf (ref 0–5)

## 2015-08-31 LAB — LACTIC ACID, PLASMA: Lactic Acid, Venous: 1.8 mmol/L (ref 0.5–2.0)

## 2015-08-31 LAB — I-STAT CG4 LACTIC ACID, ED: Lactic Acid, Venous: 2.2 mmol/L (ref 0.5–2.0)

## 2015-08-31 LAB — MRSA PCR SCREENING: MRSA by PCR: NEGATIVE

## 2015-08-31 LAB — PROCALCITONIN: Procalcitonin: 1.06 ng/mL

## 2015-08-31 MED ORDER — CEFAZOLIN SODIUM-DEXTROSE 2-4 GM/100ML-% IV SOLN
2.0000 g | Freq: Three times a day (TID) | INTRAVENOUS | Status: DC
  Administered 2015-08-31 – 2015-09-02 (×5): 2 g via INTRAVENOUS
  Filled 2015-08-31 (×8): qty 100

## 2015-08-31 MED ORDER — ONDANSETRON HCL 4 MG/2ML IJ SOLN: 4.0000 mg | Freq: Four times a day (QID) | INTRAMUSCULAR | Status: DC | PRN

## 2015-08-31 MED ORDER — ACETAMINOPHEN 325 MG PO TABS
650.0000 mg | ORAL_TABLET | Freq: Four times a day (QID) | ORAL | Status: DC | PRN
  Administered 2015-09-01 (×4): 650 mg via ORAL
  Filled 2015-08-31 (×4): qty 2

## 2015-08-31 MED ORDER — DOCUSATE SODIUM 100 MG PO CAPS
100.0000 mg | ORAL_CAPSULE | Freq: Two times a day (BID) | ORAL | Status: DC
  Administered 2015-09-01 – 2015-09-02 (×2): 100 mg via ORAL
  Filled 2015-08-31 (×4): qty 1

## 2015-08-31 MED ORDER — CITALOPRAM HYDROBROMIDE 20 MG PO TABS
20.0000 mg | ORAL_TABLET | Freq: Every day | ORAL | Status: DC
  Administered 2015-09-01 – 2015-09-02 (×2): 20 mg via ORAL
  Filled 2015-08-31 (×2): qty 1

## 2015-08-31 MED ORDER — SODIUM CHLORIDE 0.9 % IV BOLUS (SEPSIS)
1000.0000 mL | Freq: Once | INTRAVENOUS | Status: AC
Start: 2015-08-31 — End: 2015-08-31
  Administered 2015-08-31: 1000 mL via INTRAVENOUS

## 2015-08-31 MED ORDER — RIVAROXABAN 20 MG PO TABS
20.0000 mg | ORAL_TABLET | Freq: Every day | ORAL | Status: DC
Start: 2015-09-01 — End: 2015-09-02
  Administered 2015-09-01 – 2015-09-02 (×2): 20 mg via ORAL
  Filled 2015-08-31 (×2): qty 1

## 2015-08-31 MED ORDER — SODIUM CHLORIDE 0.9 % IV SOLN
INTRAVENOUS | Status: DC
  Administered 2015-08-31: 1000 mL via INTRAVENOUS

## 2015-08-31 MED ORDER — ACETAMINOPHEN 650 MG RE SUPP: 650.0000 mg | Freq: Four times a day (QID) | RECTAL | Status: DC | PRN

## 2015-08-31 MED ORDER — ONDANSETRON HCL 4 MG PO TABS: 4.0000 mg | ORAL_TABLET | Freq: Four times a day (QID) | ORAL | Status: DC | PRN

## 2015-08-31 MED ORDER — SODIUM CHLORIDE 0.9 % IV BOLUS (SEPSIS): 500.0000 mL | INTRAVENOUS | Status: AC

## 2015-08-31 MED ORDER — SODIUM CHLORIDE 0.9% FLUSH
3.0000 mL | Freq: Two times a day (BID) | INTRAVENOUS | Status: DC
  Administered 2015-09-01 (×2): 3 mL via INTRAVENOUS

## 2015-08-31 MED ORDER — SODIUM CHLORIDE 0.9 % IV BOLUS (SEPSIS)
1000.0000 mL | INTRAVENOUS | Status: AC
  Administered 2015-08-31: 1000 mL via INTRAVENOUS

## 2015-08-31 MED ORDER — IBUPROFEN 200 MG PO TABS
600.0000 mg | ORAL_TABLET | Freq: Once | ORAL | Status: AC
  Administered 2015-08-31: 600 mg via ORAL
  Filled 2015-08-31: qty 3

## 2015-08-31 MED ORDER — CEFAZOLIN SODIUM 1-5 GM-% IV SOLN
1.0000 g | Freq: Once | INTRAVENOUS | Status: AC
  Administered 2015-08-31: 1 g via INTRAVENOUS
  Filled 2015-08-31: qty 50

## 2015-08-31 NOTE — ED Notes (Addendum)
Pt states he has had a fever today at 1pm 101.2, took some Tylenol now 98.9 in triage. Pt has history of cellulitis, just returned from cruise Sat. Flew home Sat night. Dental procedure on Monday.  Dr Nyoka Cowden had pt to take Doxy while on cruise

## 2015-08-31 NOTE — Progress Notes (Signed)
Pharmacy Antibiotic Note  Marc Mills is a 70 y.o. male admitted on 08/31/2015 with cellulitis.  Pharmacy has been consulted for Ancef dosing.  Plan: Ancef 2g IV q8     Temp (24hrs), Avg:99 F (37.2 C), Min:98.6 F (37 C), Max:99.4 F (37.4 C)   Recent Labs Lab 08/31/15 1625 08/31/15 1636  WBC 21.6*  --   CREATININE 0.96  --   LATICACIDVEN  --  2.20*    CrCl cannot be calculated (Unknown ideal weight.).    Allergies  Allergen Reactions  . Hydrocodone Itching and Other (See Comments)    Severe dizziness, Gives me chills  . Oxycodone Itching and Other (See Comments)    Severe dizziness, Gives me chills     Thank you for allowing pharmacy to be a part of this patient's care.   Adrian Saran, PharmD, BCPS Pager [phone] 08/31/2015 9:44 PM

## 2015-08-31 NOTE — ED Notes (Signed)
PA given patient lactic results.

## 2015-08-31 NOTE — ED Notes (Signed)
Report called to 3W, Therapist, sports.

## 2015-08-31 NOTE — ED Notes (Signed)
Pt unable to give urine sample at this time. Started IV bolus

## 2015-08-31 NOTE — ED Notes (Signed)
Called to give report x1, advised that floor waiting on RN to arrive.  Advised ED charge.

## 2015-08-31 NOTE — ED Notes (Signed)
hospitalist at bedside
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
## 2015-08-31 NOTE — ED Provider Notes (Signed)
CSN: [id number]     Arrival date & time 08/31/15  1544 History   First MD Initiated Contact with Patient 08/31/15 1633     Chief Complaint  Patient presents with  . Fever    HPI Comments: 70 year old male who presents with URI symptoms and cough for the past 5 days. He became febrile today. PMH significant for splenectomy d/t lymphoma, recurrent cellulitis in his legs, COPD, and hx of DVT. He states he was recently on Doxycycline for a L great toe swelling and redness. Took Amoxicillin for dental procedure Monday. PCP is Dr. Everlene Farrier who recommended to come to the ED if he ever has a fever. Wife had similar symptoms however she got better whereas he has not. Has had his flu vaccine this year. Reports associated headache. Denies chills, muscle aches, chest pain, SOB, wheezing, abdominal pain, nausea, vomiting, diarrhea, dysuria. Last dose of Tylenol was 2pm today.  Patient is a 70 y.o. male presenting with fever.  Fever Associated symptoms: congestion, cough and headaches   Associated symptoms: no chest pain, no chills, no diarrhea, no dysuria, no nausea, no sore throat and no vomiting     Past Medical History  Diagnosis Date  . Morbid obesity ([place])   . History of pulmonary embolus ([facility])   . History of lymphoma   . Incisional hernia   . Arthritis   . Blood transfusion without reported diagnosis   . DVT (deep venous thrombosis) ([place])     2011 right leg  . Anorectal fistula   . OSA on CPAP     cpap  10 yrs  . Varicose veins of lower extremities     pe  . Cancer (HCC)     lymphoma hx  . Cataract   . Cellulitis 04/04/2013   Past Surgical History  Procedure Laterality Date  . Splenectomy    . Prostate surgery      (patient denies)  . Ivc filter    . Tonsillectomy      age 10  . Eye surgery Left 03    cat, detached ret    . Hernia repair    . Total knee arthroplasty Left 11/02/2012    Procedure: TOTAL KNEE ARTHROPLASTY with revision tibia;  Surgeon: Garald Balding, MD;  Location:  [facility];  Service: Orthopedics;  Laterality: Left;  . Fracture surgery    . Inguinal lymph node biopsy [name] 11/15/2013    Procedure: RIGHT INGUINAL LYMPH NODE BIOPSY;  Surgeon: Gwenyth Ober, MD;  Location: [facility];  Service: General;  Laterality: [name];   Family History  Problem Relation Age of Onset  . Heart disease Father   . Varicose Veins Father   . Heart attack Father     dx. 93s  . Heart disease Mother 68  . Varicose Veins Mother   . Pancreatic cancer Sister 57  . Cancer Maternal Grandmother     either stomach or pancreatic primary   . Pancreatic cancer Maternal Grandfather     dx. 53s  . Lung cancer Maternal Aunt     secondhand smoke exposure  . Heart attack Paternal Uncle   . Rectal cancer Maternal Aunt     dx. 77s  . Cancer Maternal Aunt     unknown type  . Diabetes Paternal Uncle   . Cancer Cousin     unknown type  . Breast cancer Cousin     dx. 16s   Social History  Substance Use Topics  . Smoking  status: Former Smoker -- 2.00 packs/day for 20 years    Types: Cigarettes    Quit date: 05/05/1984  . Smokeless tobacco: Never Used  . Alcohol Use: 0.0 oz/week    0 Standard drinks or equivalent per week     Comment: maybe 1 beer/month    Review of Systems  Constitutional: Positive for fever. Negative for chills, diaphoresis and fatigue.  HENT: Positive for congestion. Negative for sore throat.   Respiratory: Positive for cough. Negative for shortness of breath and wheezing.   Cardiovascular: Negative for chest pain, palpitations and leg swelling.  Gastrointestinal: Negative for nausea, vomiting, abdominal pain, diarrhea and constipation.  Genitourinary: Negative for dysuria.  Neurological: Positive for headaches. Negative for light-headedness.   Allergies  Hydrocodone and Oxycodone  Home Medications   Prior to Admission medications   Medication Sig Start Date End Date Taking? Authorizing Provider  acetaminophen (TYLENOL) 500 MG tablet Take 1,000 mg by mouth  every 6 (six) hours as needed for mild pain.    Historical Provider, MD  b complex vitamins tablet Take 1 tablet by mouth every morning.     Historical Provider, MD  calcium carbonate (OS-CAL) 600 MG TABS Take 600 mg by mouth every morning.     Historical Provider, MD  cholecalciferol (VITAMIN D) 1000 UNITS tablet Take 1,000 Units by mouth every morning.     Historical Provider, MD  citalopram (CELEXA) 20 MG tablet Take 1 tablet (20 mg total) by mouth daily. 05/02/14   [name], MD  doxycycline (VIBRA-TABS) 100 MG tablet Take 1 tablet (100 mg total) by mouth 2 (two) times daily. 08/13/15   [name], MD  Magnesium 400 MG CAPS Take 400 mg by mouth daily.     Historical Provider, MD  Multiple Vitamin (THERAGRAN PO) Take 1 tablet by mouth daily.    Historical Provider, MD  neomycin-polymyxin-hydrocortisone (CORTISPORIN) otic solution Place 4 drops into the left ear 3 (three) times daily. 08/13/15   [name], MD  Nutritional Supplements (SALMON OIL) CAPS Take 1 capsule by mouth daily.    Historical Provider, MD  rivaroxaban (XARELTO) 20 MG TABS tablet Take 1 tablet (20 mg total) by mouth daily with supper. 04/13/15   [name], MD  VIAGRA 100 MG tablet TAKE 1 TABLET BY MOUTH EVERY DAY AS NEEDED FOR ERECTILE DYSFUNCTION . SPLIT TABLET IN HALF AS NEEDED 07/15/15   Chelle Jeffery, PA-C   BP 120/74 mmHg  Pulse 103  Temp(Src) 98.9 F (37.2 C) (Oral)  Resp 20  SpO2 93%   Physical Exam  Constitutional: He is oriented to person, place, and time. He appears well-developed and well-nourished. No distress.  Obese  HENT:  Head: Normocephalic and atraumatic.  Nose: Nose normal. No mucosal edema or rhinorrhea.  Mouth/Throat: Uvula is midline, oropharynx is clear and moist and mucous membranes are normal.  Eyes: Conjunctivae are normal. Pupils are equal, round, and reactive to light. Right eye exhibits no discharge. Left eye exhibits no discharge. No scleral icterus.  Neck: Normal range  of motion.  Cardiovascular: Normal rate, regular rhythm and intact distal pulses.  Exam reveals no gallop and no friction rub.   No murmur heard. Pulmonary/Chest: Effort normal. No respiratory distress. He has wheezes. He has no rales. He exhibits no tenderness.  Scattered wheezes  Abdominal: Soft. There is no tenderness.  Musculoskeletal: He exhibits edema.  R leg more swollen than L. Patient states this is baseline. Calves are non-tender  Neurological: He is  alert and oriented to person, place, and time.  Skin: Skin is warm and dry.  Mild circumferential erythema of L lower leg  Psychiatric: He has a normal mood and affect.    ED Course  Procedures (including critical care time) Labs Review Labs Reviewed  COMPREHENSIVE METABOLIC PANEL - Abnormal; Notable for the following:    Glucose, Bld 130 (*)    All other components within normal limits  CBC WITH DIFFERENTIAL/PLATELET - Abnormal; Notable for the following:    WBC 21.6 (*)    Neutro Abs 19.2 (*)    Monocytes Absolute 1.5 (*)    All other components within normal limits  I-STAT CG4 LACTIC ACID, ED - Abnormal; Notable for the following:    Lactic Acid, Venous 2.20 (*)    All other components within normal limits  CULTURE, BLOOD (ROUTINE X 2)  CULTURE, BLOOD (ROUTINE X 2)  URINE CULTURE  URINALYSIS, ROUTINE W REFLEX MICROSCOPIC (NOT AT [facility])    Imaging Review Dg Chest 2 View  08/31/2015  CLINICAL DATA:  Fever today, smoking history.  History of lymphoma. EXAM: CHEST  2 VIEW COMPARISON:  Chest x-ray dated 10/19/2013. FINDINGS: Heart size is normal. Overall cardiomediastinal silhouette is stable in size and configuration. Lungs are clear. No evidence of pneumonia. No pleural effusion or pneumothorax seen. Osseous and soft tissue structures about the chest are unremarkable. IMPRESSION: Stable chest x-ray.  No evidence of pneumonia. Electronically Signed   By: Franki Cabot M.D.   On: 08/31/2015 16:49   I have personally reviewed  and evaluated these images and lab results as part of my medical decision-making.   EKG Interpretation None      MDM   Final diagnoses:  Sepsis, due to unspecified organism [facility])  Cellulitis of lower extremity, unspecified laterality    70 year old male who with PMH significant for splenectomy due to lymphoma, recurrent cellulitis, and hx of DVT/PE on chronic coagulation presents with a fever.  Patient does not appear acutely ill however CBC remarkable for WBC 21.6 and Lactic acid 2.2. CMP unremarkable.UA clean. CXR was negative. He is afebrile however he has recently taken a Tylenol. Vitals are otherwise unremarkable. IVF and Cefazolin started.  Blood cultures ordered.   Shared visit with Dr. Ralene Bathe who will consult Hospitalist for admission.  Recardo Evangelist, PA-C 08/31/15 1958  Quintella Reichert, MD 09/01/15 [phone]

## 2015-08-31 NOTE — Procedures (Signed)
Pt is on home cpap machine with home settings.

## 2015-09-01 DIAGNOSIS — I2699 Other pulmonary embolism without acute cor pulmonale: Secondary | ICD-10-CM

## 2015-09-01 LAB — CBC
HCT: 37 % — ABNORMAL LOW (ref 39.0–52.0)
Hemoglobin: 12.4 g/dL — ABNORMAL LOW (ref 13.0–17.0)
MCH: 29.5 pg (ref 26.0–34.0)
MCHC: 33.5 g/dL (ref 30.0–36.0)
MCV: 88.1 fL (ref 78.0–100.0)
Platelets: 260 10*3/uL (ref 150–400)
RBC: 4.2 MIL/uL — ABNORMAL LOW (ref 4.22–5.81)
RDW: 15 % (ref 11.5–15.5)
WBC: 17.2 10*3/uL — ABNORMAL HIGH (ref 4.0–10.5)

## 2015-09-01 LAB — COMPREHENSIVE METABOLIC PANEL
ALT: 19 U/L (ref 17–63)
AST: 19 U/L (ref 15–41)
Albumin: 3.6 g/dL (ref 3.5–5.0)
Alkaline Phosphatase: 48 U/L (ref 38–126)
Anion gap: 7 (ref 5–15)
BUN: 15 mg/dL (ref 6–20)
CO2: 24 mmol/L (ref 22–32)
Calcium: 8.3 mg/dL — ABNORMAL LOW (ref 8.9–10.3)
Chloride: 108 mmol/L (ref 101–111)
Creatinine, Ser: 0.91 mg/dL (ref 0.61–1.24)
GFR calc Af Amer: >60 mL/min (ref >60)
GFR calc non Af Amer: >60 mL/min (ref >60)
Glucose, Bld: 130 mg/dL — ABNORMAL HIGH (ref 65–99)
Potassium: 3.8 mmol/L (ref 3.5–5.1)
Sodium: 139 mmol/L (ref 135–145)
Total Bilirubin: 0.4 mg/dL (ref 0.3–1.2)
Total Protein: 6.2 g/dL — ABNORMAL LOW (ref 6.5–8.1)

## 2015-09-01 LAB — MAGNESIUM: Magnesium: 1.8 mg/dL (ref 1.7–2.4)

## 2015-09-01 LAB — TSH: TSH: 1.908 u[IU]/mL (ref 0.350–4.500)

## 2015-09-01 LAB — PHOSPHORUS: Phosphorus: 2.5 mg/dL (ref 2.5–4.6)

## 2015-09-01 LAB — LACTIC ACID, PLASMA
Lactic Acid, Venous: 3.2 mmol/L (ref 0.5–2.0)
Lactic Acid, Venous: 0.8 mmol/L (ref 0.5–2.0)

## 2015-09-01 MED ORDER — SODIUM CHLORIDE 0.9 % IV BOLUS (SEPSIS)
1000.0000 mL | Freq: Once | INTRAVENOUS | Status: AC
  Administered 2015-09-01: 1000 mL via INTRAVENOUS

## 2015-09-01 NOTE — Progress Notes (Signed)
Lab called for a critical value - Lactic acid 3.2, notified Dr.Doutova
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
## 2015-09-01 NOTE — Progress Notes (Signed)
Patient woke up form sleep complains of chilling and difficulty breathing, checked patients v/s put on 2L of O2 and put bed in high fowlers Tylenol was also  given .Marland Kitchen We will continue to monitor

## 2015-09-02 LAB — CBC
HCT: 38.4 % — ABNORMAL LOW (ref 39.0–52.0)
Hemoglobin: 12.9 g/dL — ABNORMAL LOW (ref 13.0–17.0)
MCH: 29.4 pg (ref 26.0–34.0)
MCHC: 33.6 g/dL (ref 30.0–36.0)
MCV: 87.5 fL (ref 78.0–100.0)
Platelets: 256 10*3/uL (ref 150–400)
RBC: 4.39 MIL/uL (ref 4.22–5.81)
RDW: 15.1 % (ref 11.5–15.5)
WBC: 10.2 10*3/uL (ref 4.0–10.5)

## 2015-09-02 LAB — COMPREHENSIVE METABOLIC PANEL
ALT: 21 U/L (ref 17–63)
AST: 26 U/L (ref 15–41)
Albumin: 3.7 g/dL (ref 3.5–5.0)
Alkaline Phosphatase: 54 U/L (ref 38–126)
Anion gap: 8 (ref 5–15)
BUN: 13 mg/dL (ref 6–20)
CO2: 25 mmol/L (ref 22–32)
Calcium: 8.8 mg/dL — ABNORMAL LOW (ref 8.9–10.3)
Chloride: 107 mmol/L (ref 101–111)
Creatinine, Ser: 0.69 mg/dL (ref 0.61–1.24)
GFR calc Af Amer: >60 mL/min (ref >60)
GFR calc non Af Amer: >60 mL/min (ref >60)
Glucose, Bld: 117 mg/dL — ABNORMAL HIGH (ref 65–99)
Potassium: 4.2 mmol/L (ref 3.5–5.1)
Sodium: 140 mmol/L (ref 135–145)
Total Bilirubin: 0.5 mg/dL (ref 0.3–1.2)
Total Protein: 6.7 g/dL (ref 6.5–8.1)

## 2015-09-02 LAB — URINE CULTURE: Culture: 20000 — AB

## 2015-09-02 MED ORDER — DOXYCYCLINE HYCLATE 100 MG PO TABS: 100.0000 mg | ORAL_TABLET | Freq: Two times a day (BID) | ORAL | Status: DC
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
## 2015-09-02 NOTE — Progress Notes (Signed)
This nurse went over discharge medications and instructions with patient. Patient verbalized understanding. Patient discharged from hospital via wheelchair.

## 2015-09-03 LAB — HEMOGLOBIN A1C
Hgb A1c MFr Bld: 5.8 % — ABNORMAL HIGH (ref 4.8–5.6)
Mean Plasma Glucose: 120 mg/dL

## 2015-09-04 DIAGNOSIS — M1712 Unilateral primary osteoarthritis, left knee: Secondary | ICD-10-CM | POA: Diagnosis not present

## 2015-09-05 LAB — CULTURE, BLOOD (ROUTINE X 2)
Culture: NO GROWTH
Culture: NO GROWTH

## 2015-09-11 DIAGNOSIS — M1712 Unilateral primary osteoarthritis, left knee: Secondary | ICD-10-CM | POA: Diagnosis not present

## 2015-09-12 DIAGNOSIS — H31002 Unspecified chorioretinal scars, left eye: Secondary | ICD-10-CM | POA: Diagnosis not present

## 2015-09-12 DIAGNOSIS — H524 Presbyopia: Secondary | ICD-10-CM | POA: Diagnosis not present

## 2015-09-12 DIAGNOSIS — Z961 Presence of intraocular lens: Secondary | ICD-10-CM | POA: Diagnosis not present

## 2015-09-17 ENCOUNTER — Other Ambulatory Visit (HOSPITAL_BASED_OUTPATIENT_CLINIC_OR_DEPARTMENT_OTHER): Payer: Medicare Other

## 2015-09-17 DIAGNOSIS — C8307 Small cell B-cell lymphoma, spleen: Secondary | ICD-10-CM | POA: Diagnosis not present

## 2015-09-17 DIAGNOSIS — Z8572 Personal history of non-Hodgkin lymphomas: Secondary | ICD-10-CM

## 2015-09-17 LAB — COMPREHENSIVE METABOLIC PANEL
ALT: 21 U/L (ref 0–55)
AST: 16 U/L (ref 5–34)
Albumin: 3.7 g/dL (ref 3.5–5.0)
Alkaline Phosphatase: 62 U/L (ref 40–150)
Anion Gap: 7 mEq/L (ref 3–11)
BUN: 19.3 mg/dL (ref 7.0–26.0)
CO2: 26 mEq/L (ref 22–29)
Calcium: 9.3 mg/dL (ref 8.4–10.4)
Chloride: 107 mEq/L (ref 98–109)
Creatinine: 0.8 mg/dL (ref 0.7–1.3)
EGFR: >90 mL/min/{1.73_m2} (ref >90)
Glucose: 107 mg/dl (ref 70–140)
Potassium: 4.9 mEq/L (ref 3.5–5.1)
Sodium: 139 mEq/L (ref 136–145)
Total Bilirubin: 0.35 mg/dL (ref 0.20–1.20)
Total Protein: 6.9 g/dL (ref 6.4–8.3)

## 2015-09-17 LAB — CBC WITH DIFFERENTIAL/PLATELET
BASO%: 0.6 % (ref 0.0–2.0)
Basophils Absolute: 0 10*3/uL (ref 0.0–0.1)
EOS%: 6.8 % (ref 0.0–7.0)
Eosinophils Absolute: 0.4 10*3/uL (ref 0.0–0.5)
HCT: 38.9 % (ref 38.4–49.9)
HGB: 12.8 g/dL — ABNORMAL LOW (ref 13.0–17.1)
LYMPH%: 22.3 % (ref 14.0–49.0)
MCH: 29.6 pg (ref 27.2–33.4)
MCHC: 32.9 g/dL (ref 32.0–36.0)
MCV: 90 fL (ref 79.3–98.0)
MONO#: 0.8 10*3/uL (ref 0.1–0.9)
MONO%: 13.1 % (ref 0.0–14.0)
NEUT#: 3.6 10*3/uL (ref 1.5–6.5)
NEUT%: 57.2 % (ref 39.0–75.0)
Platelets: 367 10*3/uL (ref 140–400)
RBC: 4.32 10*6/uL (ref 4.20–5.82)
RDW: 15.5 % — ABNORMAL HIGH (ref 11.0–14.6)
WBC: 6.3 10*3/uL (ref 4.0–10.3)
lymph#: 1.4 10*3/uL (ref 0.9–3.3)

## 2015-09-17 LAB — LACTATE DEHYDROGENASE: LDH: 104 U/L — ABNORMAL LOW (ref 125–245)

## 2015-09-18 LAB — BETA 2 MICROGLOBULIN, SERUM: Beta-2: 1.7 mg/L (ref 0.6–2.4)

## 2015-09-19 DIAGNOSIS — M1712 Unilateral primary osteoarthritis, left knee: Secondary | ICD-10-CM | POA: Diagnosis not present

## 2015-09-21 ENCOUNTER — Other Ambulatory Visit: Payer: Self-pay | Admitting: Emergency Medicine

## 2015-09-21 DIAGNOSIS — L03119 Cellulitis of unspecified part of limb: Secondary | ICD-10-CM

## 2015-09-24 ENCOUNTER — Ambulatory Visit (HOSPITAL_BASED_OUTPATIENT_CLINIC_OR_DEPARTMENT_OTHER): Payer: Medicare Other | Admitting: Oncology

## 2015-09-24 ENCOUNTER — Telehealth: Payer: Self-pay | Admitting: Oncology

## 2015-09-24 VITALS — BP 143/75 | HR 61 | Temp 98.3°F | Resp 18 | Ht 72.0 in | Wt 343.2 lb

## 2015-09-24 DIAGNOSIS — Z7901 Long term (current) use of anticoagulants: Secondary | ICD-10-CM | POA: Diagnosis not present

## 2015-09-24 DIAGNOSIS — C8307 Small cell B-cell lymphoma, spleen: Secondary | ICD-10-CM

## 2015-09-24 DIAGNOSIS — Z86711 Personal history of pulmonary embolism: Secondary | ICD-10-CM | POA: Diagnosis not present

## 2015-09-24 DIAGNOSIS — Z9081 Acquired absence of spleen: Secondary | ICD-10-CM

## 2015-09-24 DIAGNOSIS — Z8572 Personal history of non-Hodgkin lymphomas: Secondary | ICD-10-CM

## 2015-09-24 DIAGNOSIS — Z86718 Personal history of other venous thrombosis and embolism: Secondary | ICD-10-CM

## 2015-09-24 DIAGNOSIS — L03116 Cellulitis of left lower limb: Secondary | ICD-10-CM
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
## 2015-09-24 NOTE — Telephone Encounter (Signed)
appt made and avs printed °

## 2015-10-13 DIAGNOSIS — Z86711 Personal history of pulmonary embolism: Secondary | ICD-10-CM | POA: Diagnosis not present

## 2015-10-13 DIAGNOSIS — F329 Major depressive disorder, single episode, unspecified: Secondary | ICD-10-CM | POA: Diagnosis not present

## 2015-10-13 DIAGNOSIS — Z7901 Long term (current) use of anticoagulants: Secondary | ICD-10-CM | POA: Diagnosis not present

## 2015-10-13 DIAGNOSIS — L03116 Cellulitis of left lower limb: Secondary | ICD-10-CM | POA: Diagnosis not present

## 2015-10-22 ENCOUNTER — Ambulatory Visit: Payer: Medicare Other | Admitting: Internal Medicine

## 2015-10-30 DIAGNOSIS — R600 Localized edema: Secondary | ICD-10-CM | POA: Insufficient documentation

## 2015-10-30 DIAGNOSIS — B351 Tinea unguium: Secondary | ICD-10-CM | POA: Diagnosis not present

## 2015-10-30 DIAGNOSIS — Z9081 Acquired absence of spleen: Secondary | ICD-10-CM | POA: Diagnosis not present

## 2015-10-30 DIAGNOSIS — L0390 Cellulitis, unspecified: Secondary | ICD-10-CM | POA: Diagnosis not present

## 2015-11-19 ENCOUNTER — Other Ambulatory Visit: Payer: Self-pay

## 2015-11-19 MED ORDER — CITALOPRAM HYDROBROMIDE 20 MG PO TABS: 20.0000 mg | ORAL_TABLET | Freq: Every day | ORAL | Status: DC

## 2015-11-19 NOTE — Telephone Encounter (Signed)
fax request for 90 day supply Citalopram HBR 20mg  tabs; Pt rec rx 07/05/2015 #30 with 7 refills. Currently has 3 refills left - changed rx to 90 days with no refills.

## 2015-11-27 DIAGNOSIS — B351 Tinea unguium: Secondary | ICD-10-CM | POA: Diagnosis not present

## 2015-11-27 DIAGNOSIS — L0390 Cellulitis, unspecified: Secondary | ICD-10-CM | POA: Diagnosis not present

## 2015-11-27 DIAGNOSIS — R600 Localized edema: Secondary | ICD-10-CM | POA: Diagnosis not present

## 2015-11-27 DIAGNOSIS — Z9081 Acquired absence of spleen: Secondary | ICD-10-CM | POA: Diagnosis not present

## 2015-11-28 ENCOUNTER — Telehealth: Payer: Self-pay | Admitting: Emergency Medicine

## 2015-11-28 ENCOUNTER — Ambulatory Visit (INDEPENDENT_AMBULATORY_CARE_PROVIDER_SITE_OTHER): Payer: Medicare Other | Admitting: *Deleted

## 2015-11-28 DIAGNOSIS — Z86718 Personal history of other venous thrombosis and embolism: Secondary | ICD-10-CM

## 2015-11-28 DIAGNOSIS — Z792 Long term (current) use of antibiotics: Secondary | ICD-10-CM

## 2015-11-28 LAB — CBC
HCT: 38.9 % (ref 38.5–50.0)
Hemoglobin: 13.3 g/dL (ref 13.2–17.1)
MCH: 31.1 pg (ref 27.0–33.0)
MCHC: 34.2 g/dL (ref 32.0–36.0)
MCV: 91.1 fL (ref 80.0–100.0)
MPV: 10.5 fL (ref 7.5–12.5)
Platelets: 334 10*3/uL (ref 140–400)
RBC: 4.27 MIL/uL (ref 4.20–5.80)
RDW: 14.9 % (ref 11.0–15.0)
WBC: 6.5 10*3/uL (ref 3.8–10.8)

## 2015-11-28 LAB — BASIC METABOLIC PANEL
BUN: 18 mg/dL (ref 7–25)
CO2: 26 mmol/L (ref 20–31)
Calcium: 9.3 mg/dL (ref 8.6–10.3)
Chloride: 104 mmol/L (ref 98–110)
Creat: 0.81 mg/dL (ref 0.70–1.25)
Glucose, Bld: 87 mg/dL (ref 65–99)
Potassium: 4.7 mmol/L (ref 3.5–5.3)
Sodium: 139 mmol/L (ref 135–146)

## 2015-11-28 NOTE — Telephone Encounter (Signed)
-----   Message from [name], MD sent at 11/28/2015  3:07 PM EDT ----- Call patient CBC is normal. For the copy to Dr. Jana Hakim.

## 2015-11-28 NOTE — Progress Notes (Signed)
Pt was started on Xarelto 20 mg daily for PE and DVT on 04/13/2015.    Reviewed patients medication list.  Pt is not currently on any combined P-gp and strong CYP3A4 inhibitors/inducers (ketoconazole, traconazole, ritonavir, carbamazepine, phenytoin, rifampin, St. John's wort).  Reviewed labs.  SCr 0.81 , Weight 156.27kg, CrCl-190.246 .  Dose is  appropriate based on CrCl.   Hgb 13.3 and HCT 38.9  A full discussion of the nature of anticoagulants has been carried out.  A benefit/risk analysis has been presented to the patient, so that they understand the justification for choosing anticoagulation with Xarelto at this time.  The need for compliance is stressed.  Pt is aware to take the medication once daily with the largest meal of the day.  Side effects of potential bleeding are discussed, including unusual colored urine or stools, coughing up blood or coffee ground emesis, nose bleeds or serious fall or head trauma.  Discussed signs and symptoms of stroke. The patient should avoid any OTC items containing aspirin or ibuprofen.  Avoid alcohol consumption.   Call if any signs of abnormal bleeding.  Discussed financial obligations and pt states is not having  any difficulty in obtaining medication.  Next lab test test in 6 months.  Pt states has not missed any doses of Xarelto and has had no sign or symptom of bleeding or sign or symptom of stroke  PT scheduled for CBC and BMET today and will call with results of labs 11/29/2015 Spoke with pt and instructed to continue Xarelto 20 mg daily and appt made to be rechecked in 6 months and pt states understanding

## 2016-01-01 DIAGNOSIS — Z23 Encounter for immunization: Secondary | ICD-10-CM | POA: Diagnosis not present

## 2016-01-09 ENCOUNTER — Ambulatory Visit: Payer: Medicare Other | Admitting: Internal Medicine

## 2016-01-15 DIAGNOSIS — B351 Tinea unguium: Secondary | ICD-10-CM | POA: Diagnosis not present

## 2016-01-15 DIAGNOSIS — Z9081 Acquired absence of spleen: Secondary | ICD-10-CM | POA: Diagnosis not present

## 2016-01-15 DIAGNOSIS — L0390 Cellulitis, unspecified: Secondary | ICD-10-CM | POA: Diagnosis not present

## 2016-02-23 ENCOUNTER — Other Ambulatory Visit: Payer: Self-pay

## 2016-02-23 MED ORDER — RIVAROXABAN 20 MG PO TABS: 20.0000 mg | ORAL_TABLET | Freq: Every day | ORAL | 1 refills | Status: DC

## 2016-02-23 NOTE — Telephone Encounter (Signed)
Fax request fore Xarelto 20mg .  Pt being followed by Dr. Keturah Barre in Anti-coag clinic per note 11/28/2015  Please advise on refills - former pt Dr. Everlene Farrier

## 2016-02-23 NOTE — Telephone Encounter (Signed)
Meds ordered this encounter  Medications  . rivaroxaban (XARELTO) 20 MG TABS tablet    Sig: Take 1 tablet (20 mg total) by mouth daily.    Dispense:  90 tablet    Refill:  1    To begin after starter pack is complete

## 2016-02-26 ENCOUNTER — Other Ambulatory Visit: Payer: Self-pay | Admitting: Emergency Medicine

## 2016-03-04 ENCOUNTER — Other Ambulatory Visit: Payer: Self-pay | Admitting: Physician Assistant

## 2016-03-10 ENCOUNTER — Other Ambulatory Visit: Payer: Self-pay | Admitting: Physician Assistant

## 2016-03-11 DIAGNOSIS — G4733 Obstructive sleep apnea (adult) (pediatric): Secondary | ICD-10-CM | POA: Diagnosis not present

## 2016-03-11 DIAGNOSIS — L03119 Cellulitis of unspecified part of limb: Secondary | ICD-10-CM | POA: Diagnosis not present

## 2016-03-11 DIAGNOSIS — G250 Essential tremor: Secondary | ICD-10-CM | POA: Diagnosis not present

## 2016-03-11 DIAGNOSIS — Z86010 Personal history of colonic polyps: Secondary | ICD-10-CM | POA: Diagnosis not present

## 2016-03-11 DIAGNOSIS — Z8579 Personal history of other malignant neoplasms of lymphoid, hematopoietic and related tissues: Secondary | ICD-10-CM | POA: Diagnosis not present

## 2016-03-11 DIAGNOSIS — F419 Anxiety disorder, unspecified: Secondary | ICD-10-CM | POA: Diagnosis not present

## 2016-03-11 DIAGNOSIS — Z6841 Body Mass Index (BMI) 40.0 and over, adult: Secondary | ICD-10-CM | POA: Diagnosis not present

## 2016-03-11 DIAGNOSIS — N529 Male erectile dysfunction, unspecified: Secondary | ICD-10-CM | POA: Diagnosis not present

## 2016-03-11 DIAGNOSIS — I82409 Acute embolism and thrombosis of unspecified deep veins of unspecified lower extremity: Secondary | ICD-10-CM | POA: Diagnosis not present

## 2016-03-24 ENCOUNTER — Other Ambulatory Visit: Payer: Medicare Other

## 2016-03-25 ENCOUNTER — Telehealth: Payer: Self-pay | Admitting: Oncology

## 2016-03-25 NOTE — Telephone Encounter (Signed)
Appointment rescheduled per patient request. Patient came to scheduling to reschedule appointment. Patient given calendars with future scheduled appointments.

## 2016-03-28 ENCOUNTER — Other Ambulatory Visit (HOSPITAL_BASED_OUTPATIENT_CLINIC_OR_DEPARTMENT_OTHER): Payer: Medicare Other

## 2016-03-28 DIAGNOSIS — Z9081 Acquired absence of spleen: Secondary | ICD-10-CM

## 2016-03-28 DIAGNOSIS — Z8572 Personal history of non-Hodgkin lymphomas: Secondary | ICD-10-CM

## 2016-03-28 DIAGNOSIS — Z86711 Personal history of pulmonary embolism: Secondary | ICD-10-CM

## 2016-03-28 DIAGNOSIS — C8307 Small cell B-cell lymphoma, spleen: Secondary | ICD-10-CM | POA: Diagnosis not present

## 2016-03-28 DIAGNOSIS — Z86718 Personal history of other venous thrombosis and embolism: Secondary | ICD-10-CM | POA: Diagnosis not present

## 2016-03-28 DIAGNOSIS — Z7901 Long term (current) use of anticoagulants: Secondary | ICD-10-CM

## 2016-03-28 LAB — CBC WITH DIFFERENTIAL/PLATELET
BASO%: 1.3 % (ref 0.0–2.0)
Basophils Absolute: 0.1 10*3/uL (ref 0.0–0.1)
EOS%: 8.8 % — ABNORMAL HIGH (ref 0.0–7.0)
Eosinophils Absolute: 0.6 10*3/uL — ABNORMAL HIGH (ref 0.0–0.5)
HCT: 40.2 % (ref 38.4–49.9)
HGB: 13.3 g/dL (ref 13.0–17.1)
LYMPH%: 18.4 % (ref 14.0–49.0)
MCH: 29.6 pg (ref 27.2–33.4)
MCHC: 33.1 g/dL (ref 32.0–36.0)
MCV: 89.5 fL (ref 79.3–98.0)
MONO#: 1 10*3/uL — ABNORMAL HIGH (ref 0.1–0.9)
MONO%: 14.8 % — ABNORMAL HIGH (ref 0.0–14.0)
NEUT#: 3.6 10*3/uL (ref 1.5–6.5)
NEUT%: 56.7 % (ref 39.0–75.0)
Platelets: 305 10*3/uL (ref 140–400)
RBC: 4.49 10*6/uL (ref 4.20–5.82)
RDW: 14.7 % — ABNORMAL HIGH (ref 11.0–14.6)
WBC: 6.4 10*3/uL (ref 4.0–10.3)
lymph#: 1.2 10*3/uL (ref 0.9–3.3)

## 2016-03-28 LAB — COMPREHENSIVE METABOLIC PANEL
ALT: 17 U/L (ref 0–55)
AST: 17 U/L (ref 5–34)
Albumin: 3.6 g/dL (ref 3.5–5.0)
Alkaline Phosphatase: 67 U/L (ref 40–150)
Anion Gap: 8 mEq/L (ref 3–11)
BUN: 19.8 mg/dL (ref 7.0–26.0)
CO2: 25 mEq/L (ref 22–29)
Calcium: 9.4 mg/dL (ref 8.4–10.4)
Chloride: 107 mEq/L (ref 98–109)
Creatinine: 0.7 mg/dL (ref 0.7–1.3)
EGFR: >90 mL/min/{1.73_m2} (ref >90)
Glucose: 110 mg/dl (ref 70–140)
Potassium: 4.7 mEq/L (ref 3.5–5.1)
Sodium: 140 mEq/L (ref 136–145)
Total Bilirubin: 0.41 mg/dL (ref 0.20–1.20)
Total Protein: 6.9 g/dL (ref 6.4–8.3)

## 2016-03-28 LAB — LACTATE DEHYDROGENASE: LDH: 108 U/L — ABNORMAL LOW (ref 125–245)

## 2016-03-29 LAB — BETA 2 MICROGLOBULIN, SERUM: Beta-2: 1.7 mg/L (ref 0.6–2.4)

## 2016-03-30 ENCOUNTER — Encounter: Payer: Self-pay | Admitting: Oncology

## 2016-04-01 ENCOUNTER — Encounter: Payer: Self-pay | Admitting: Internal Medicine

## 2016-04-03 ENCOUNTER — Ambulatory Visit (INDEPENDENT_AMBULATORY_CARE_PROVIDER_SITE_OTHER): Payer: Medicare Other | Admitting: Internal Medicine

## 2016-04-03 ENCOUNTER — Encounter: Payer: Self-pay | Admitting: Internal Medicine

## 2016-04-03 VITALS — BP 112/80 | HR 64 | Ht 72.0 in | Wt 348.0 lb

## 2016-04-03 DIAGNOSIS — G4733 Obstructive sleep apnea (adult) (pediatric): Secondary | ICD-10-CM | POA: Diagnosis not present
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
## 2016-04-03 NOTE — Patient Instructions (Signed)
Order- DME [facility] Please reduce CPAP auto range to 4-10, continue mask of choice, humidifier, supplies, AirView   Dx OSA  Please call if we can help

## 2016-04-04 NOTE — Assessment & Plan Note (Signed)
He continues to swim for exercise every day but has been unable to lose weight. Very after, patient has been discussed.

## 2016-04-04 NOTE — Assessment & Plan Note (Signed)
Download confirms good compliance and control. We discussed goals. He would like to try a lower pressure to reduce leaks. We discussed mask fit. Plan-reduce auto Pap to 4-10

## 2016-04-07 ENCOUNTER — Other Ambulatory Visit: Payer: Self-pay | Admitting: Family Medicine

## 2016-04-07 NOTE — Telephone Encounter (Signed)
Last seen 05/2015 needs ov

## 2016-06-03 ENCOUNTER — Ambulatory Visit (INDEPENDENT_AMBULATORY_CARE_PROVIDER_SITE_OTHER): Payer: Medicare Other | Admitting: *Deleted

## 2016-06-03 DIAGNOSIS — I2699 Other pulmonary embolism without acute cor pulmonale: Secondary | ICD-10-CM

## 2016-06-04 DIAGNOSIS — L03116 Cellulitis of left lower limb: Secondary | ICD-10-CM | POA: Diagnosis not present

## 2016-06-04 DIAGNOSIS — Z23 Encounter for immunization: Secondary | ICD-10-CM | POA: Diagnosis not present

## 2016-06-04 DIAGNOSIS — R600 Localized edema: Secondary | ICD-10-CM | POA: Diagnosis not present

## 2016-06-04 DIAGNOSIS — L03115 Cellulitis of right lower limb: Secondary | ICD-10-CM | POA: Diagnosis not present

## 2016-06-04 DIAGNOSIS — Z9081 Acquired absence of spleen: Secondary | ICD-10-CM | POA: Diagnosis not present

## 2016-06-04 LAB — BASIC METABOLIC PANEL
BUN/Creatinine Ratio: 19 (ref 10–24)
BUN: 15 mg/dL (ref 8–27)
CO2: 23 mmol/L (ref 18–29)
Calcium: 9.3 mg/dL (ref 8.6–10.2)
Chloride: 99 mmol/L (ref 96–106)
Creatinine, Ser: 0.81 mg/dL (ref 0.76–1.27)
GFR calc Af Amer: 104 mL/min/{1.73_m2} (ref >59)
GFR calc non Af Amer: 90 mL/min/{1.73_m2} (ref >59)
Glucose: 94 mg/dL (ref 65–99)
Potassium: 4.7 mmol/L (ref 3.5–5.2)
Sodium: 141 mmol/L (ref 134–144)

## 2016-06-04 LAB — CBC
Hematocrit: 40.9 % (ref 37.5–51.0)
Hemoglobin: 13.7 g/dL (ref 13.0–17.7)
MCH: 30.5 pg (ref 26.6–33.0)
MCHC: 33.5 g/dL (ref 31.5–35.7)
MCV: 91 fL (ref 79–97)
Platelets: 325 10*3/uL (ref 150–379)
RBC: 4.49 x10E6/uL (ref 4.14–5.80)
RDW: 14.5 % (ref 12.3–15.4)
WBC: 6.8 10*3/uL (ref 3.4–10.8)

## 2016-06-04 MED ORDER — RIVAROXABAN 20 MG PO TABS: 20.0000 mg | ORAL_TABLET | Freq: Every day | ORAL | 3 refills | Status: DC

## 2016-06-04 NOTE — Progress Notes (Signed)
Pt was started on Xarelto 20 mg daily for PE and DVT on 04/13/2015.    Reviewed patients medication list.  Pt is not currently on any combined P-gp and strong CYP3A4 inhibitors/inducers (ketoconazole, traconazole, ritonavir, carbamazepine, phenytoin, rifampin, St. John's wort).  Reviewed labs.  SCr 0.81 , Weight 158.6 kg, CrCl-190.13ml/min.  Dose is  appropriate based on CrCl.   Hgb 13.7 and HCT 40.9.  A full discussion of the nature of anticoagulants has been carried out.  A benefit/risk analysis has been presented to the patient, so that they understand the justification for choosing anticoagulation with Xarelto at this time.  The need for compliance is stressed.  Pt is aware to take the medication once daily with the largest meal of the day.  Side effects of potential bleeding are discussed, including unusual colored urine or stools, coughing up blood or coffee ground emesis, nose bleeds or serious fall or head trauma.  Discussed signs and symptoms of stroke. The patient should avoid any OTC items containing aspirin or ibuprofen.  Avoid alcohol consumption.   Call if any signs of abnormal bleeding.  Discussed financial obligations and pt states is not having  any difficulty in obtaining medication.  Next lab test test in 12 months. Pt states has not missed any doses of Xarelto and has had no sign or symptom of bleeding or sign or symptom of stroke.   06/04/2016, Spoke with pt reviewed lab results and instructed to continue Xarelto 20 mg daily. Pt verbalized  Understanding.

## 2016-06-05 DIAGNOSIS — Z8579 Personal history of other malignant neoplasms of lymphoid, hematopoietic and related tissues: Secondary | ICD-10-CM | POA: Diagnosis not present

## 2016-06-05 DIAGNOSIS — Z136 Encounter for screening for cardiovascular disorders: Secondary | ICD-10-CM | POA: Diagnosis not present

## 2016-06-05 DIAGNOSIS — Z0000 Encounter for general adult medical examination without abnormal findings: Secondary | ICD-10-CM | POA: Diagnosis not present

## 2016-06-05 DIAGNOSIS — Z125 Encounter for screening for malignant neoplasm of prostate: Secondary | ICD-10-CM | POA: Diagnosis not present

## 2016-06-05 DIAGNOSIS — Z1389 Encounter for screening for other disorder: Secondary | ICD-10-CM | POA: Diagnosis not present

## 2016-06-05 DIAGNOSIS — I82409 Acute embolism and thrombosis of unspecified deep veins of unspecified lower extremity: Secondary | ICD-10-CM | POA: Diagnosis not present

## 2016-06-05 DIAGNOSIS — G250 Essential tremor: Secondary | ICD-10-CM | POA: Diagnosis not present

## 2016-06-05 DIAGNOSIS — G4733 Obstructive sleep apnea (adult) (pediatric): Secondary | ICD-10-CM | POA: Diagnosis not present

## 2016-06-05 DIAGNOSIS — N529 Male erectile dysfunction, unspecified: Secondary | ICD-10-CM | POA: Diagnosis not present

## 2016-06-05 DIAGNOSIS — E559 Vitamin D deficiency, unspecified: Secondary | ICD-10-CM | POA: Diagnosis not present

## 2016-06-05 DIAGNOSIS — L820 Inflamed seborrheic keratosis: Secondary | ICD-10-CM | POA: Diagnosis not present

## 2016-06-05 DIAGNOSIS — F419 Anxiety disorder, unspecified: Secondary | ICD-10-CM | POA: Diagnosis not present

## 2016-06-05 DIAGNOSIS — L03119 Cellulitis of unspecified part of limb: Secondary | ICD-10-CM | POA: Diagnosis not present

## 2016-06-05 DIAGNOSIS — Z6841 Body Mass Index (BMI) 40.0 and over, adult: Secondary | ICD-10-CM | POA: Diagnosis not present

## 2016-09-12 ENCOUNTER — Other Ambulatory Visit (HOSPITAL_BASED_OUTPATIENT_CLINIC_OR_DEPARTMENT_OTHER): Payer: Medicare Other

## 2016-09-12 DIAGNOSIS — C8307 Small cell B-cell lymphoma, spleen: Secondary | ICD-10-CM

## 2016-09-12 DIAGNOSIS — Z8572 Personal history of non-Hodgkin lymphomas: Secondary | ICD-10-CM

## 2016-09-12 DIAGNOSIS — Z9081 Acquired absence of spleen: Secondary | ICD-10-CM

## 2016-09-12 LAB — COMPREHENSIVE METABOLIC PANEL
ALT: 19 U/L (ref 0–55)
AST: 18 U/L (ref 5–34)
Albumin: 4 g/dL (ref 3.5–5.0)
Alkaline Phosphatase: 65 U/L (ref 40–150)
Anion Gap: 10 mEq/L (ref 3–11)
BUN: 17.5 mg/dL (ref 7.0–26.0)
CO2: 27 mEq/L (ref 22–29)
Calcium: 9.9 mg/dL (ref 8.4–10.4)
Chloride: 106 mEq/L (ref 98–109)
Creatinine: 0.8 mg/dL (ref 0.7–1.3)
EGFR: >90 mL/min/{1.73_m2} (ref >90)
Glucose: 98 mg/dl (ref 70–140)
Potassium: 4.9 mEq/L (ref 3.5–5.1)
Sodium: 142 mEq/L (ref 136–145)
Total Bilirubin: 0.53 mg/dL (ref 0.20–1.20)
Total Protein: 7.2 g/dL (ref 6.4–8.3)

## 2016-09-12 LAB — CBC WITH DIFFERENTIAL/PLATELET
BASO%: 1 % (ref 0.0–2.0)
Basophils Absolute: 0.1 10*3/uL (ref 0.0–0.1)
EOS%: 7 % (ref 0.0–7.0)
Eosinophils Absolute: 0.4 10*3/uL (ref 0.0–0.5)
HCT: 43.4 % (ref 38.4–49.9)
HGB: 14.1 g/dL (ref 13.0–17.1)
LYMPH%: 17.4 % (ref 14.0–49.0)
MCH: 29.8 pg (ref 27.2–33.4)
MCHC: 32.5 g/dL (ref 32.0–36.0)
MCV: 91.8 fL (ref 79.3–98.0)
MONO#: 0.9 10*3/uL (ref 0.1–0.9)
MONO%: 15.2 % — ABNORMAL HIGH (ref 0.0–14.0)
NEUT#: 3.6 10*3/uL (ref 1.5–6.5)
NEUT%: 59.4 % (ref 39.0–75.0)
Platelets: 307 10*3/uL (ref 140–400)
RBC: 4.73 10*6/uL (ref 4.20–5.82)
RDW: 14.9 % — ABNORMAL HIGH (ref 11.0–14.6)
WBC: 6 10*3/uL (ref 4.0–10.3)
lymph#: 1 10*3/uL (ref 0.9–3.3)

## 2016-09-12 LAB — LACTATE DEHYDROGENASE: LDH: 112 U/L — ABNORMAL LOW (ref 125–245)

## 2016-09-13 LAB — BETA 2 MICROGLOBULIN, SERUM: Beta-2: 1.8 mg/L (ref 0.6–2.4)

## 2016-09-15 ENCOUNTER — Other Ambulatory Visit: Payer: Medicare Other

## 2016-09-17 ENCOUNTER — Telehealth: Payer: Self-pay | Admitting: *Deleted

## 2016-09-22 ENCOUNTER — Ambulatory Visit (HOSPITAL_BASED_OUTPATIENT_CLINIC_OR_DEPARTMENT_OTHER): Payer: Medicare Other | Admitting: Oncology

## 2016-09-22 ENCOUNTER — Encounter: Payer: Self-pay | Admitting: Oncology

## 2016-09-22 VITALS — BP 140/71 | HR 66 | Temp 97.8°F | Resp 18 | Ht 72.0 in | Wt 347.9 lb

## 2016-09-22 DIAGNOSIS — Z86711 Personal history of pulmonary embolism: Secondary | ICD-10-CM

## 2016-09-22 DIAGNOSIS — Z8572 Personal history of non-Hodgkin lymphomas: Secondary | ICD-10-CM | POA: Diagnosis not present

## 2016-09-22 DIAGNOSIS — Z86718 Personal history of other venous thrombosis and embolism: Secondary | ICD-10-CM | POA: Diagnosis not present

## 2016-09-22 DIAGNOSIS — C8307 Small cell B-cell lymphoma, spleen: Secondary | ICD-10-CM
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
## 2016-11-21 DIAGNOSIS — Z961 Presence of intraocular lens: Secondary | ICD-10-CM | POA: Diagnosis not present

## 2016-11-21 DIAGNOSIS — H5213 Myopia, bilateral: Secondary | ICD-10-CM | POA: Diagnosis not present

## 2016-11-21 DIAGNOSIS — H31002 Unspecified chorioretinal scars, left eye: Secondary | ICD-10-CM | POA: Diagnosis not present

## 2016-12-02 DIAGNOSIS — K604 Rectal fistula: Secondary | ICD-10-CM | POA: Diagnosis not present

## 2016-12-02 DIAGNOSIS — L821 Other seborrheic keratosis: Secondary | ICD-10-CM | POA: Diagnosis not present

## 2016-12-02 DIAGNOSIS — H6122 Impacted cerumen, left ear: Secondary | ICD-10-CM | POA: Diagnosis not present

## 2016-12-03 DIAGNOSIS — Z23 Encounter for immunization: Secondary | ICD-10-CM | POA: Diagnosis not present

## 2016-12-03 DIAGNOSIS — Z9081 Acquired absence of spleen: Secondary | ICD-10-CM | POA: Diagnosis not present

## 2016-12-03 DIAGNOSIS — L0390 Cellulitis, unspecified: Secondary | ICD-10-CM | POA: Diagnosis not present

## 2017-02-02 DIAGNOSIS — Z23 Encounter for immunization: Secondary | ICD-10-CM | POA: Diagnosis not present

## 2017-02-20 NOTE — Telephone Encounter (Signed)
No entry 

## 2017-03-12 NOTE — Telephone Encounter (Signed)
error 

## 2017-05-18 DIAGNOSIS — J0100 Acute maxillary sinusitis, unspecified: Secondary | ICD-10-CM | POA: Diagnosis not present

## 2017-05-18 DIAGNOSIS — H10023 Other mucopurulent conjunctivitis, bilateral: Secondary | ICD-10-CM | POA: Diagnosis not present

## 2017-06-10 DIAGNOSIS — N529 Male erectile dysfunction, unspecified: Secondary | ICD-10-CM | POA: Diagnosis not present

## 2017-06-10 DIAGNOSIS — Z8579 Personal history of other malignant neoplasms of lymphoid, hematopoietic and related tissues: Secondary | ICD-10-CM | POA: Diagnosis not present

## 2017-06-10 DIAGNOSIS — Z1389 Encounter for screening for other disorder: Secondary | ICD-10-CM | POA: Diagnosis not present

## 2017-06-10 DIAGNOSIS — L03119 Cellulitis of unspecified part of limb: Secondary | ICD-10-CM | POA: Diagnosis not present

## 2017-06-10 DIAGNOSIS — Z0000 Encounter for general adult medical examination without abnormal findings: Secondary | ICD-10-CM | POA: Diagnosis not present

## 2017-06-10 DIAGNOSIS — F419 Anxiety disorder, unspecified: Secondary | ICD-10-CM | POA: Diagnosis not present

## 2017-06-10 DIAGNOSIS — I82409 Acute embolism and thrombosis of unspecified deep veins of unspecified lower extremity: Secondary | ICD-10-CM | POA: Diagnosis not present

## 2017-06-10 DIAGNOSIS — G250 Essential tremor: Secondary | ICD-10-CM | POA: Diagnosis not present

## 2017-06-10 DIAGNOSIS — E559 Vitamin D deficiency, unspecified: Secondary | ICD-10-CM | POA: Diagnosis not present

## 2017-06-10 DIAGNOSIS — K604 Rectal fistula: Secondary | ICD-10-CM | POA: Diagnosis not present

## 2017-06-10 DIAGNOSIS — Z86010 Personal history of colonic polyps: Secondary | ICD-10-CM | POA: Diagnosis not present

## 2017-06-10 DIAGNOSIS — Z125 Encounter for screening for malignant neoplasm of prostate: Secondary | ICD-10-CM | POA: Diagnosis not present

## 2017-06-24 DIAGNOSIS — R600 Localized edema: Secondary | ICD-10-CM | POA: Diagnosis not present

## 2017-06-24 DIAGNOSIS — Z9081 Acquired absence of spleen: Secondary | ICD-10-CM | POA: Diagnosis not present

## 2017-06-24 DIAGNOSIS — B351 Tinea unguium: Secondary | ICD-10-CM | POA: Diagnosis not present

## 2017-06-24 DIAGNOSIS — L0390 Cellulitis, unspecified: Secondary | ICD-10-CM | POA: Diagnosis not present

## 2017-09-29 ENCOUNTER — Encounter: Payer: Self-pay | Admitting: Family Medicine

## 2017-11-23 DIAGNOSIS — Z961 Presence of intraocular lens: Secondary | ICD-10-CM | POA: Diagnosis not present

## 2017-11-23 DIAGNOSIS — H31002 Unspecified chorioretinal scars, left eye: Secondary | ICD-10-CM | POA: Diagnosis not present

## 2017-11-23 DIAGNOSIS — H5213 Myopia, bilateral: Secondary | ICD-10-CM | POA: Diagnosis not present

## 2017-12-08 ENCOUNTER — Encounter: Payer: Self-pay | Admitting: Internal Medicine

## 2017-12-18 ENCOUNTER — Encounter (INDEPENDENT_AMBULATORY_CARE_PROVIDER_SITE_OTHER): Payer: Self-pay | Admitting: Orthopaedic Surgery

## 2017-12-18 ENCOUNTER — Ambulatory Visit (INDEPENDENT_AMBULATORY_CARE_PROVIDER_SITE_OTHER): Payer: Medicare Other

## 2017-12-18 ENCOUNTER — Ambulatory Visit (INDEPENDENT_AMBULATORY_CARE_PROVIDER_SITE_OTHER): Payer: Medicare Other | Admitting: Orthopaedic Surgery

## 2017-12-18 DIAGNOSIS — M25572 Pain in left ankle and joints of left foot: Secondary | ICD-10-CM | POA: Insufficient documentation

## 2017-12-18 DIAGNOSIS — M25571 Pain in right ankle and joints of right foot: Secondary | ICD-10-CM

## 2017-12-18 MED ORDER — LIDOCAINE HCL 1 % IJ SOLN
2.0000 mL | INTRAMUSCULAR | Status: AC | PRN
  Administered 2017-12-18: 2 mL

## 2017-12-18 MED ORDER — BUPIVACAINE HCL 0.5 % IJ SOLN
1.0000 mL | INTRAMUSCULAR | Status: AC | PRN
  Administered 2017-12-18: 1 mL via INTRA_ARTICULAR

## 2017-12-18 MED ORDER — METHYLPREDNISOLONE ACETATE 40 MG/ML IJ SUSP
40.0000 mg | INTRAMUSCULAR | Status: AC | PRN
  Administered 2017-12-18: 40 mg via INTRA_ARTICULAR
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
## 2017-12-30 ENCOUNTER — Ambulatory Visit (INDEPENDENT_AMBULATORY_CARE_PROVIDER_SITE_OTHER): Payer: Medicare Other | Admitting: Podiatry

## 2017-12-30 ENCOUNTER — Encounter: Payer: Self-pay | Admitting: Podiatry

## 2017-12-30 DIAGNOSIS — M79676 Pain in unspecified toe(s): Secondary | ICD-10-CM | POA: Diagnosis not present

## 2017-12-30 DIAGNOSIS — B351 Tinea unguium: Secondary | ICD-10-CM | POA: Diagnosis not present

## 2018-01-02 NOTE — Progress Notes (Signed)
   SUBJECTIVE Patient presents to office today complaining of elongated, thickened nails that cause pain while ambulating in shoes. He is unable to trim his own nails. Patient is here for further evaluation and treatment.  Past Medical History:  Diagnosis Date  . Anorectal fistula   . Arthritis   . Blood transfusion without reported diagnosis   . Cancer (HCC)    lymphoma hx  . Cataract   . Cellulitis 04/04/2013  . DVT (deep venous thrombosis) ([place])    2011 right leg  . History of lymphoma   . History of pulmonary embolus (PE)   . Incisional hernia   . Morbid obesity ([place])   . OSA on CPAP    cpap  10 yrs  . Varicose veins of lower extremities    pe    OBJECTIVE General Patient is awake, alert, and oriented x 3 and in no acute distress. Derm Skin is dry and supple bilateral. Negative open lesions or macerations. Remaining integument unremarkable. Nails are tender, long, thickened and dystrophic with subungual debris, consistent with onychomycosis, 1-5 bilateral. No signs of infection noted. Vasc  DP and PT pedal pulses palpable bilaterally. Temperature gradient within normal limits.  Neuro Epicritic and protective threshold sensation grossly intact bilaterally.  Musculoskeletal Exam No symptomatic pedal deformities noted bilateral. Muscular strength within normal limits.  ASSESSMENT 1. Onychodystrophic nails 1-5 bilateral with hyperkeratosis of nails.  2. Onychomycosis of nail due to dermatophyte bilateral 3. Pain in foot bilateral  PLAN OF CARE 1. Patient evaluated today.  2. Instructed to maintain good pedal hygiene and foot care.  3. Mechanical debridement of nails 1-5 bilaterally performed using a nail nipper. Filed with dremel without incident.  4. Return to clinic in 3 mos.   Goes by [facility].   Edrick Kins, DPM [facility]  Dr. Edrick Kins, [address]                                        [place], [place] [address]                  Office [phone]  Fax [phone]

## 2018-01-27 DIAGNOSIS — Z23 Encounter for immunization: Secondary | ICD-10-CM | POA: Diagnosis not present

## 2018-01-27 DIAGNOSIS — Z9081 Acquired absence of spleen: Secondary | ICD-10-CM | POA: Diagnosis not present

## 2018-01-27 DIAGNOSIS — B351 Tinea unguium: Secondary | ICD-10-CM | POA: Diagnosis not present

## 2018-01-27 DIAGNOSIS — L0390 Cellulitis, unspecified: Secondary | ICD-10-CM | POA: Diagnosis not present

## 2018-02-08 DIAGNOSIS — I8390 Asymptomatic varicose veins of unspecified lower extremity: Secondary | ICD-10-CM | POA: Diagnosis not present

## 2018-02-08 DIAGNOSIS — F418 Other specified anxiety disorders: Secondary | ICD-10-CM | POA: Diagnosis not present

## 2018-02-08 DIAGNOSIS — L820 Inflamed seborrheic keratosis: Secondary | ICD-10-CM | POA: Diagnosis not present

## 2018-02-08 DIAGNOSIS — R251 Tremor, unspecified: Secondary | ICD-10-CM | POA: Diagnosis not present

## 2018-03-10 DIAGNOSIS — R600 Localized edema: Secondary | ICD-10-CM | POA: Diagnosis not present

## 2018-03-10 DIAGNOSIS — L0390 Cellulitis, unspecified: Secondary | ICD-10-CM | POA: Diagnosis not present

## 2018-03-10 DIAGNOSIS — Z9081 Acquired absence of spleen: Secondary | ICD-10-CM | POA: Diagnosis not present

## 2018-03-10 DIAGNOSIS — I878 Other specified disorders of veins: Secondary | ICD-10-CM | POA: Diagnosis not present

## 2018-03-10 DIAGNOSIS — B351 Tinea unguium: Secondary | ICD-10-CM | POA: Diagnosis not present

## 2018-03-11 ENCOUNTER — Encounter: Payer: Self-pay | Admitting: Internal Medicine

## 2018-03-11 ENCOUNTER — Ambulatory Visit (INDEPENDENT_AMBULATORY_CARE_PROVIDER_SITE_OTHER): Payer: Medicare Other | Admitting: Internal Medicine

## 2018-03-11 VITALS — BP 126/82 | HR 85 | Ht 72.0 in | Wt 338.4 lb

## 2018-03-11 DIAGNOSIS — G4733 Obstructive sleep apnea (adult) (pediatric): Secondary | ICD-10-CM

## 2018-03-11 DIAGNOSIS — Z6841 Body Mass Index (BMI) 40.0 and over, adult: Secondary | ICD-10-CM | POA: Diagnosis not present

## 2018-03-11 NOTE — Patient Instructions (Signed)
We can continue CPAP auto 4-10, mask of choice, humidifier, supplies, AirView  Please call if we can help
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
## 2018-03-24 ENCOUNTER — Ambulatory Visit (INDEPENDENT_AMBULATORY_CARE_PROVIDER_SITE_OTHER): Payer: Medicare Other | Admitting: Podiatry

## 2018-03-24 ENCOUNTER — Encounter: Payer: Self-pay | Admitting: Podiatry

## 2018-03-24 DIAGNOSIS — M79675 Pain in left toe(s): Secondary | ICD-10-CM

## 2018-03-24 DIAGNOSIS — B351 Tinea unguium: Secondary | ICD-10-CM | POA: Diagnosis not present

## 2018-03-24 DIAGNOSIS — M79674 Pain in right toe(s): Secondary | ICD-10-CM

## 2018-03-24 NOTE — Progress Notes (Signed)
Subjective: Marc Mills presents today for foot care follow-up.  He states he is now on oral Lamisil by disease doctor.  He states his infectious disease doctor seems to think that his chronic lower extremity cellulitis coming from his fungal toenails.  He would like to discuss other treatment options should the Lamisil not work.    Today, he is c/o of painful, discolored, thick toenails which interfere with daily activities and routine tasks.  Pain is aggravated when wearing enclosed shoe gear. Pain is getting progressively worse and relieved with periodic professional debridement.  Objective: Neurovascular status intact bilaterally.   Dependent edema bilateral lower extremities  Dermatological Examination: Skin with normal turgor texture and tone bilaterally Toenails 1-5 b/l discolored, thick, dystrophic with subungual debris and pain with palpation to nailbeds due to thickness of nails.  Musculoskeletal: Muscle strength 5/5 to all LE muscle groups  Neurological: Sensation intact with 10 gram monofilament. Vibratory sensation intact.  Assessment: Painful onychomycosis toenails 1-5 b/l.  Plan: 1. Patient currently under the care of of an infectious disease specialist who is prescribed oral Lamisil for him.  We also discussed laser therapy treatment for onychomycosis.  He was informed that this would not be covered by his insurance.  He would like time to think about it. 2. Toenails 1-5 b/l were debrided in length and girth without iatrogenic bleeding. 3. Patient to continue soft, supportive shoe gear 4. Patient to report any pedal injuries to medical professional immediately. 5. Follow up 3 months. Patient/POA to call should there be a concern in the interim.

## 2018-03-24 NOTE — Patient Instructions (Addendum)
Onychomycosis/Fungal Toenails  WHAT IS IT? An infection that lies within the keratin of your nail plate that is caused by a fungus.  WHY ME? Fungal infections affect all ages, sexes, races, and creeds.  There may be many factors that predispose you to a fungal infection such as age, coexisting medical conditions such as diabetes, or an autoimmune disease; stress, medications, fatigue, genetics, etc.  Bottom line: fungus thrives in a warm, moist environment and your shoes offer such a location.  IS IT CONTAGIOUS? Theoretically, yes.  You do not want to share shoes, nail clippers or files with someone who has fungal toenails.  Walking around barefoot in the same room or sleeping in the same bed is unlikely to transfer the organism.  It is important to realize, however, that fungus can spread easily from one nail to the next on the same foot.  HOW DO WE TREAT THIS?  There are several ways to treat this condition.  Treatment may depend on many factors such as age, medications, pregnancy, liver and kidney conditions, etc.  It is best to ask your doctor which options are available to you.  1. No treatment.   Unlike many other medical concerns, you can live with this condition.  However for many people this can be a painful condition and may lead to ingrown toenails or a bacterial infection.  It is recommended that you keep the nails cut short to help reduce the amount of fungal nail. 2. Topical treatment.  These range from herbal remedies to prescription strength nail lacquers.  About 40-50% effective, topicals require twice daily application for approximately 9 to 12 months or until an entirely new nail has grown out.  The most effective topicals are medical grade medications available through physicians offices. 3. Oral antifungal medications.  With an 80-90% cure rate, the most common oral medication requires 3 to 4 months of therapy and stays in your system for a year as the new nail grows out.  Oral  antifungal medications do require blood work to make sure it is a safe drug for you.  A liver function panel will be performed prior to starting the medication and after the first month of treatment.  It is important to have the blood work performed to avoid any harmful side effects.  In general, this medication safe but blood work is required. 4. Laser Therapy.  This treatment is performed by applying a specialized laser to the affected nail plate.  This therapy is noninvasive, fast, and non-painful.  It is not covered by insurance and is therefore, out of pocket.  The results have been very good with a 80-95% cure rate.  [facility] is the only practice in the area to offer this therapy. 5. Permanent Nail Avulsion.  Removing the entire nail so that a new nail will not grow back.  Corns and Calluses Corns are small areas of thickened skin that occur on the top, sides, or tip of a toe. They contain a cone-shaped core with a point that can press on a nerve below. This causes pain. Calluses are areas of thickened skin that can occur anywhere on the body including hands, fingers, palms, soles of the feet, and heels.Calluses are usually larger than corns. What are the causes? Corns and calluses are caused by rubbing (friction) or pressure, such as from shoes that are too tight or do not fit properly. What increases the risk? Corns are more likely to develop in people who have toe   deformities, such as hammer toes. Since calluses can occur with friction to any area of the skin, calluses are more likely to develop in people who:  Work with their hands.  Wear shoes that fit poorly, shoes that are too tight, or shoes that are high-heeled.  Have toes deformities.  What are the signs or symptoms? Symptoms of a corn or callus include:  A hard growth on the skin.  Pain or tenderness under the skin.  Redness and swelling.  Increased discomfort while wearing tight-fitting shoes.  How is this  diagnosed? Corns and calluses may be diagnosed with a medical history and physical exam. How is this treated? Corns and calluses may be treated with:  Removing the cause of the friction or pressure. This may include: ? Changing your shoes. ? Wearing shoe inserts (orthotics) or other protective layers in your shoes, such as a corn pad. ? Wearing gloves.  Medicines to help soften skin in the hardened, thickened areas.  Reducing the size of the corn or callus by removing the dead layers of skin.  Antibiotic medicines to treat infection.  Surgery, if a toe deformity is the cause.  Follow these instructions at home:  Take medicines only as directed by your health care provider.  If you were prescribed an antibiotic, finish all of it even if you start to feel better.  Wear shoes that fit well. Avoid wearing high-heeled shoes and shoes that are too tight or too loose.  Wear any padding, protective layers, gloves, or orthotics as directed by your health care provider.  Soak your hands or feet and then use a file or pumice stone to soften your corn or callus. Do this as directed by your health care provider.  Check your corn or callus every day for signs of infection. Watch for: ? Redness, swelling, or pain. ? Fluid, blood, or pus. Contact a health care provider if:  Your symptoms do not improve with treatment.  You have increased redness, swelling, or pain at the site of your corn or callus.  You have fluid, blood, or pus coming from your corn or callus.  You have new symptoms. This information is not intended to replace advice given to you by your health care provider. Make sure you discuss any questions you have with your health care provider. Document Released: 01/26/2004 Document Revised: 11/09/2015 Document Reviewed: 04/17/2014 Elsevier Interactive Patient Education  2018 [employer]vier Inc.  

## 2018-05-02 NOTE — Assessment & Plan Note (Signed)
Tinges to benefit from CPAP with good compliance and control documented. Plan-continue CPAP auto 4-10

## 2018-05-02 NOTE — Assessment & Plan Note (Signed)
Although he reports swimming regularly for exercise, he has been morbidly obese as long as I have known him and has not been prepared to make lifestyle changes to alter this.

## 2018-06-11 ENCOUNTER — Ambulatory Visit (INDEPENDENT_AMBULATORY_CARE_PROVIDER_SITE_OTHER): Payer: Medicare Other | Admitting: Orthopaedic Surgery

## 2018-06-14 ENCOUNTER — Ambulatory Visit (INDEPENDENT_AMBULATORY_CARE_PROVIDER_SITE_OTHER): Payer: Medicare Other

## 2018-06-14 ENCOUNTER — Ambulatory Visit (INDEPENDENT_AMBULATORY_CARE_PROVIDER_SITE_OTHER): Payer: Medicare Other | Admitting: Orthopaedic Surgery

## 2018-06-14 ENCOUNTER — Encounter (INDEPENDENT_AMBULATORY_CARE_PROVIDER_SITE_OTHER): Payer: Self-pay | Admitting: Orthopaedic Surgery

## 2018-06-14 VITALS — BP 118/78 | HR 65 | Ht 71.0 in | Wt 335.0 lb

## 2018-06-14 DIAGNOSIS — M25571 Pain in right ankle and joints of right foot: Secondary | ICD-10-CM

## 2018-06-14 DIAGNOSIS — M25562 Pain in left knee: Secondary | ICD-10-CM

## 2018-06-14 MED ORDER — LIDOCAINE HCL 1 % IJ SOLN
2.0000 mL | INTRAMUSCULAR | Status: AC | PRN
  Administered 2018-06-14: 2 mL

## 2018-06-14 MED ORDER — BUPIVACAINE HCL 0.5 % IJ SOLN
1.0000 mL | INTRAMUSCULAR | Status: AC | PRN
  Administered 2018-06-14: 1 mL via INTRA_ARTICULAR

## 2018-06-14 MED ORDER — METHYLPREDNISOLONE ACETATE 40 MG/ML IJ SUSP
40.0000 mg | INTRAMUSCULAR | Status: AC | PRN
  Administered 2018-06-14: 40 mg via INTRA_ARTICULAR

## 2018-06-14 NOTE — Progress Notes (Signed)
Office Visit Note   Patient: Marc Mills           Date of Birth: 26-Jul-1945           MRN: [id number] Visit Date: 06/14/2018              Requested by: Josetta Huddle, MD [address] [place], [place] [address] PCP: Josetta Huddle, MD   Assessment & Plan: Visit Diagnoses:  1. Left knee pain, unspecified chronicity   2. Pain in right ankle and joints of right foot     Plan: Left total knee replacement appears to be in excellent position without problem.  Right ankle discomfort is consistent with traumatic arthrosis related to injury many many years ago.  Will inject with cortisone as I have in the past with good results  Follow-Up Instructions: No follow-ups on file.   Orders:  Orders Placed This Encounter  Procedures  . Medium Joint Inj  . XR KNEE 3 VIEW LEFT   No orders of the defined types were placed in this encounter.     Procedures: Medium Joint Inj on 06/14/2018 12:41 PM Details: 25 G needle, anterolateral approach Medications: 2 mL lidocaine 1 %; 1 mL bupivacaine 0.5 %; 40 mg methylPREDNISolone acetate 40 MG/ML      Clinical Data: No additional findings.   Subjective: Chief Complaint  Patient presents with  . Right Ankle - Follow-up  . Left Knee - Follow-up  Patient presents today for follow up of his right ankle. He was last seen 59month ago and had x-rays and a cortisone injection. He went home with an ASO brace. He said that his ankle has good and bad days. The injection seems to help for almost two weeks. He stopped wearing the brace because he did not see any improvement with it. He is taking tylenol for pain. He has noticed a nerve pain that radiates into his second toe. The nerve pan started about a month ago.  He wants to have his left knee checked today as well. He said that it is more sensitive than usual on the inferior aspect of his knee. He noticed the sensitivity about 4 days ago. No known injury. He had his left knee  replaced 11/02/12. HPI  Review of Systems  Constitutional: Negative for fatigue.  HENT: Negative for ear pain.   Eyes: Negative for pain.  Respiratory: Negative for shortness of breath.   Cardiovascular: Positive for leg swelling.  Gastrointestinal: Positive for diarrhea. Negative for constipation.  Endocrine: Negative for cold intolerance and heat intolerance.  Genitourinary: Negative for difficulty urinating.  Musculoskeletal: Positive for joint swelling.  Skin: Negative for rash.  Allergic/Immunologic: Negative for food allergies.  Neurological: Negative for weakness.  Hematological: Does not bruise/bleed easily.  Psychiatric/Behavioral: Negative for sleep disturbance.     Objective: Vital Signs: BP 118/78   Pulse 65   Ht _0  (1.803 m)   Wt (!) 335 lb (152 kg)   BMI 46.72 kg/m   Physical Exam Constitutional:      Appearance: He is well-developed.  Eyes:     Pupils: Pupils are equal, round, and reactive to light.  Pulmonary:     Effort: Pulmonary effort is normal.  Skin:    General: Skin is warm and dry.  Neurological:     Mental Status: He is alert and oriented to person, place, and time.  Psychiatric:        Behavior: Behavior normal.     Ortho  Exam awake alert and oriented x3.  Left knee with full extension.  No instability.  No significant localized tenderness and no effusion.  Flexed about 100 degrees. Right ankle with some limitation of motion in flexion extension based on the traumatic arthrosis previously diagnosed.  Also has some limited subtalar range of motion.  Chronic edema of the ankle and foot.  Pes planus.  Good capillary refill to toes but poor pulses.  Foot was warm.  No deformity of toes.  Toenails are hypoplastic.  No clawing or hammering of any of the toes.  No pain over the metatarsal phalangeal joint great toe  Specialty Comments:  No specialty comments available.  Imaging: Xr Knee 3 View Left  Result Date: 06/14/2018 Films of the left  total knee replacement reveal excellent position of the components.  No complicating features.  No evidence of loosening    PMFS History: Patient Active Problem List   Diagnosis Date Noted  . Left knee pain 06/14/2018  . Pain in right ankle and joints of right foot 12/18/2017  . Bilateral edema of lower extremity 10/30/2015  . Onychomycosis 10/30/2015  . Sepsis ([facility]) 08/31/2015  . History of pulmonary embolism   . History of DVT (deep vein thrombosis)   . Pyrexia   . Cellulitis 03/18/2015  . Chronic anticoagulation 03/17/2015  . Morbid obesity with BMI of 45.0-49.9, adult ([facility]) 03/17/2015  . S/P IVC filter 03/17/2015  . Genetic testing 01/12/2015  . Postoperative wound infection 12/09/2013  . Postop check 11/24/2013  . Ulcer of lower limb, unspecified 08/24/2013  . Inguinal lymphadenopathy 08/16/2013  . Encounter for therapeutic drug monitoring 06/09/2013  . Cellulitis of left lower extremity 04/04/2013  . Osteoarthritis of left knee 11/02/2012  . Cellulitis of right lower extremity 04/30/2012  . 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 Status post splenectomy 04/29/2012  . Incisional hernia 05/27/2011  . Splenic marginal zone b-cell lymphoma ([place]) 04/16/2010  . Morbid obesity ([facility]) 04/19/2008  . Obstructive sleep apnea 04/19/2008  . VARICOSE VEINS, LOWER EXTREMITIES 04/19/2008  . PULMONARY EMBOLISM, HX OF 04/19/2008   Past Medical History:  Diagnosis Date  . Anorectal fistula   . Arthritis   . Blood transfusion without reported diagnosis   . Cancer ([facility])    lymphoma hx  . Cataract   . Cellulitis 04/04/2013  . DVT (deep venous thrombosis) ([facility])    2011 right leg  . History of lymphoma   . History of pulmonary embolus (PE)   . Incisional hernia   . Morbid obesity ([place])   . OSA on CPAP    cpap  10 yrs  . Varicose veins of lower extremities    pe    Family History  Problem Relation Age of Onset  . Heart disease Father   . Varicose Veins Father   .  Heart attack Father        dx. 32s  . Heart disease Mother 50  . Varicose Veins Mother   . Pancreatic cancer Sister 10  . Cancer Maternal Grandmother        either stomach or pancreatic primary   . Pancreatic cancer Maternal Grandfather        dx. 27s  . Lung cancer Maternal Aunt        secondhand smoke exposure  . Heart attack Paternal Uncle   . Rectal cancer Maternal Aunt        dx. 44s  . Cancer Maternal Aunt  unknown type  . Diabetes Paternal Uncle   . Cancer Cousin        unknown type  . Breast cancer Cousin        dx. 24s    Past Surgical History:  Procedure Laterality Date  . EYE SURGERY Left 03   cat, detached ret    . FRACTURE SURGERY    . HERNIA REPAIR    . INGUINAL LYMPH NODE BIOPSY [name] 11/15/2013   Procedure: RIGHT INGUINAL LYMPH NODE BIOPSY;  Surgeon: Gwenyth Ober, MD;  Location: [facility];  Service: General;  Laterality: [name];  . ivc filter    . PROSTATE SURGERY     (patient denies)  . SPLENECTOMY    . TONSILLECTOMY     age 36  . TOTAL KNEE ARTHROPLASTY Left 11/02/2012   Procedure: TOTAL KNEE ARTHROPLASTY with revision tibia;  Surgeon: Garald Balding, MD;  Location: [facility];  Service: Orthopedics;  Laterality: Left;   Social History   Occupational History  . Not on file  Tobacco Use  . Smoking status: Former Smoker    Packs/day: 2.00    Years: 20.00    Pack years: 40.00    Types: Cigarettes    Last attempt to quit: 05/05/1984    Years since quitting: 34.1  . Smokeless tobacco: Never Used  Substance and Sexual Activity  . Alcohol use: Yes    Alcohol/week: 0.0 standard drinks    Comment: maybe 1 beer/month  . Drug use: No  . Sexual activity: Yes    Birth control/protection: None

## 2018-06-15 DIAGNOSIS — Z1389 Encounter for screening for other disorder: Secondary | ICD-10-CM | POA: Diagnosis not present

## 2018-06-15 DIAGNOSIS — Z8579 Personal history of other malignant neoplasms of lymphoid, hematopoietic and related tissues: Secondary | ICD-10-CM | POA: Diagnosis not present

## 2018-06-15 DIAGNOSIS — G4733 Obstructive sleep apnea (adult) (pediatric): Secondary | ICD-10-CM | POA: Diagnosis not present

## 2018-06-15 DIAGNOSIS — Z125 Encounter for screening for malignant neoplasm of prostate: Secondary | ICD-10-CM | POA: Diagnosis not present

## 2018-06-15 DIAGNOSIS — N529 Male erectile dysfunction, unspecified: Secondary | ICD-10-CM | POA: Diagnosis not present

## 2018-06-15 DIAGNOSIS — Z6841 Body Mass Index (BMI) 40.0 and over, adult: Secondary | ICD-10-CM | POA: Diagnosis not present

## 2018-06-15 DIAGNOSIS — Z86010 Personal history of colonic polyps: Secondary | ICD-10-CM | POA: Diagnosis not present

## 2018-06-15 DIAGNOSIS — I82409 Acute embolism and thrombosis of unspecified deep veins of unspecified lower extremity: Secondary | ICD-10-CM | POA: Diagnosis not present

## 2018-06-15 DIAGNOSIS — Z0000 Encounter for general adult medical examination without abnormal findings: Secondary | ICD-10-CM | POA: Diagnosis not present

## 2018-06-15 DIAGNOSIS — E559 Vitamin D deficiency, unspecified: Secondary | ICD-10-CM | POA: Diagnosis not present

## 2018-06-15 DIAGNOSIS — F418 Other specified anxiety disorders: Secondary | ICD-10-CM | POA: Diagnosis not present

## 2018-06-15 DIAGNOSIS — F419 Anxiety disorder, unspecified: Secondary | ICD-10-CM | POA: Diagnosis not present

## 2018-06-15 DIAGNOSIS — G250 Essential tremor: Secondary | ICD-10-CM | POA: Diagnosis not present

## 2018-07-01 ENCOUNTER — Ambulatory Visit: Payer: Medicare Other | Admitting: Podiatry

## 2018-08-10 ENCOUNTER — Ambulatory Visit: Payer: Medicare Other | Admitting: Podiatry

## 2018-10-06 DIAGNOSIS — L0390 Cellulitis, unspecified: Secondary | ICD-10-CM | POA: Diagnosis not present

## 2018-10-06 DIAGNOSIS — Z9081 Acquired absence of spleen: Secondary | ICD-10-CM | POA: Diagnosis not present

## 2018-10-06 DIAGNOSIS — R600 Localized edema: Secondary | ICD-10-CM | POA: Diagnosis not present

## 2018-10-06 DIAGNOSIS — B351 Tinea unguium: Secondary | ICD-10-CM | POA: Diagnosis not present

## 2018-10-20 DIAGNOSIS — Z23 Encounter for immunization: Secondary | ICD-10-CM | POA: Diagnosis not present

## 2018-11-25 DIAGNOSIS — H524 Presbyopia: Secondary | ICD-10-CM | POA: Diagnosis not present

## 2018-11-25 DIAGNOSIS — Z961 Presence of intraocular lens: Secondary | ICD-10-CM | POA: Diagnosis not present

## 2018-11-25 DIAGNOSIS — H31002 Unspecified chorioretinal scars, left eye: Secondary | ICD-10-CM | POA: Diagnosis not present

## 2019-02-12 DIAGNOSIS — Z23 Encounter for immunization: Secondary | ICD-10-CM | POA: Diagnosis not present

## 2019-03-14 ENCOUNTER — Encounter: Payer: Self-pay | Admitting: Primary Care

## 2019-03-14 ENCOUNTER — Other Ambulatory Visit: Payer: Self-pay

## 2019-03-14 ENCOUNTER — Ambulatory Visit (INDEPENDENT_AMBULATORY_CARE_PROVIDER_SITE_OTHER): Payer: Medicare Other | Admitting: Primary Care

## 2019-03-14 ENCOUNTER — Ambulatory Visit: Payer: Medicare Other | Admitting: Internal Medicine

## 2019-03-14 DIAGNOSIS — G4733 Obstructive sleep apnea (adult) (pediatric): Secondary | ICD-10-CM | POA: Diagnosis not present

## 2019-03-14 NOTE — Patient Instructions (Addendum)
Pleasure meeting you today Marc Mills, great work wearing your CPAP  Recommendations: Continue wearing CPAP 4-6 hours every night  Do not drive if experiencing excessive fatigue   Follow-up: 1 year fu with Dr. Annamaria Boots      Sleep Apnea Sleep apnea is a condition in which breathing pauses or becomes shallow during sleep. Episodes of sleep apnea usually last 10 seconds or longer, and they may occur as many as 20 times an hour. Sleep apnea disrupts your sleep and keeps your body from getting the rest that it needs. This condition can increase your risk of certain health problems, including:  Heart attack.  Stroke.  Obesity.  Diabetes.  Heart failure.  Irregular heartbeat. What are the causes? There are three kinds of sleep apnea:  Obstructive sleep apnea. This kind is caused by a blocked or collapsed airway.  Central sleep apnea. This kind happens when the part of the brain that controls breathing does not send the correct signals to the muscles that control breathing.  Mixed sleep apnea. This is a combination of obstructive and central sleep apnea. The most common cause of this condition is a collapsed or blocked airway. An airway can collapse or become blocked if:  Your throat muscles are abnormally relaxed.  Your tongue and tonsils are larger than normal.  You are overweight.  Your airway is smaller than normal. What increases the risk? You are more likely to develop this condition if you:  Are overweight.  Smoke.  Have a smaller than normal airway.  Are elderly.  Are male.  Drink alcohol.  Take sedatives or tranquilizers.  Have a family history of sleep apnea. What are the signs or symptoms? Symptoms of this condition include:  Trouble staying asleep.  Daytime sleepiness and tiredness.  Irritability.  Loud snoring.  Morning headaches.  Trouble concentrating.  Forgetfulness.  Decreased interest in sex.  Unexplained sleepiness.  Mood swings.   Personality changes.  Feelings of depression.  Waking up often during the night to urinate.  Dry mouth.  Sore throat. How is this diagnosed? This condition may be diagnosed with:  A medical history.  A physical exam.  A series of tests that are done while you are sleeping (sleep study). These tests are usually done in a sleep lab, but they may also be done at home. How is this treated? Treatment for this condition aims to restore normal breathing and to ease symptoms during sleep. It may involve managing health issues that can affect breathing, such as high blood pressure or obesity. Treatment may include:  Sleeping on your side.  Using a decongestant if you have nasal congestion.  Avoiding the use of depressants, including alcohol, sedatives, and narcotics.  Losing weight if you are overweight.  Making changes to your diet.  Quitting smoking.  Using a device to open your airway while you sleep, such as: ? An oral appliance. This is a custom-made mouthpiece that shifts your lower jaw forward. ? A continuous positive airway pressure (CPAP) device. This device blows air through a mask when you breathe out (exhale). ? A nasal expiratory positive airway pressure (EPAP) device. This device has valves that you put into each nostril. ? A bi-level positive airway pressure (BPAP) device. This device blows air through a mask when you breathe in (inhale) and breathe out (exhale).  Having surgery if other treatments do not work. During surgery, excess tissue is removed to create a wider airway. It is important to get treatment for sleep apnea. Without  treatment, this condition can lead to:  High blood pressure.  Coronary artery disease.  In men, an inability to achieve or maintain an erection (impotence).  Reduced thinking abilities. Follow these instructions at home: Lifestyle  Make any lifestyle changes that your health care provider recommends.  Eat a healthy,  well-balanced diet.  Take steps to lose weight if you are overweight.  Avoid using depressants, including alcohol, sedatives, and narcotics.  Do not use any products that contain nicotine or tobacco, such as cigarettes, e-cigarettes, and chewing tobacco. If you need help quitting, ask your health care provider. General instructions  Take over-the-counter and prescription medicines only as told by your health care provider.  If you were given a device to open your airway while you sleep, use it only as told by your health care provider.  If you are having surgery, make sure to tell your health care provider you have sleep apnea. You may need to bring your device with you.  Keep all follow-up visits as told by your health care provider. This is important. Contact a health care provider if:  The device that you received to open your airway during sleep is uncomfortable or does not seem to be working.  Your symptoms do not improve.  Your symptoms get worse. Get help right away if:  You develop: ? Chest pain. ? Shortness of breath. ? Discomfort in your back, arms, or stomach.  You have: ? Trouble speaking. ? Weakness on one side of your body. ? Drooping in your face. These symptoms may represent a serious problem that is an emergency. Do not wait to see if the symptoms will go away. Get medical help right away. Call your local emergency services (911 in the [place] Do not drive yourself to the hospital. Summary  Sleep apnea is a condition in which breathing pauses or becomes shallow during sleep.  The most common cause is a collapsed or blocked airway.  The goal of treatment is to restore normal breathing and to ease symptoms during sleep. This information is not intended to replace advice given to you by your health care provider. Make sure you discuss any questions you have with your health care provider. Document Released: 04/11/2002 Document Revised: 02/05/2018 Document  Reviewed: 12/15/2017 Elsevier Patient Education  2020 [employer].

## 2019-03-14 NOTE — Assessment & Plan Note (Signed)
-   Well controlled on CPAP; 100% compliant with use - No issues with mask fit or pressure setting - Pressure 4-10cm h20; AHI 0.6 - Advised not to drive if experiencing excessive daytime fatigue or somnolence  - Encourage weight loss and physical activity - FU in 1 year with Dr. Annamaria Boots

## 2019-03-14 NOTE — Progress Notes (Signed)
@Patient  ID: Marc Mills, male    DOB: May 02, 1946, 72 y.o.   MRN: [id number]  Chief Complaint  Patient presents with  . Follow-up    Referring provider: Josetta Huddle, MD  HPI: 73 year male, former smoker. PMH OSA, DVT/PE, morbid obesity, lymphoma. Patient of Dr. Annamaria Boots, last seen on 03/11/18. NPSG July 2015 showed severe OSA, AHI 32.4/hr. Maintained on CPAP 4-10cm H20. DME company is [employer].   03/14/2019 Patient presents today for 1 year follow-up. He is doing well, compliant with CPAP. No issues with mask fit or pressure setting. He is able to sleep through the night, not tired during the day. Sleeps 6.5 hours at night and takes a 1 hour nap during the day. He swims 1.2 mile 6 days a week. He is interested in a travel machine but will have to pay out of pocket. This is on hold currently because he is not traveling d/t COVID. Due for new machine next year. Denies shortness of breath, wheezing or cough.  [facility]view download 10/8-11/6/20: Usage 30/30 days; 100%> 4 hours Average usage 7 hours 57mins Pressure 4-10cm h20 (10cm h20-95%) AHI 0.6  Office spirometry 10/18/12- mild obstructive airways disease, insignificant response to bronchodilator . FVC 4.13/84%, FEV1 2.96/78%, FEV1/FVC 0.72/ 93% , FEF25-75% 2.18/ 65% 40 pack year smoking hx, ending 1986, so this is mild COPD.  Allergies  Allergen Reactions  . Hydrocodone Itching and Other (See Comments)    Severe dizziness, Gives me chills  . Oxycodone Itching and Other (See Comments)    Severe dizziness, Gives me chills    Immunization History  Administered Date(s) Administered  . Hepatitis A 11/05/2010, 10/15/2011  . HiB (PRP-T) 02/21/2015  . Influenza Split 02/03/2011  . Influenza Whole 03/05/2009, 03/05/2012, 03/12/2013  . Influenza,inj,Quad PF,6+ Mos 12/22/2013, 01/09/2015  . Influenza-Unspecified 01/01/2016  . Meningococcal Conjugate 01/15/2010, 02/21/2015  . Pneumococcal Polysaccharide-23 07/13/2008, 01/15/2010,  02/21/2015  . Tdap 07/13/2008    Past Medical History:  Diagnosis Date  . Anorectal fistula   . Arthritis   . Blood transfusion without reported diagnosis   . Cancer (HCC)    lymphoma hx  . Cataract   . Cellulitis 04/04/2013  . DVT (deep venous thrombosis) ([place])    2011 right leg  . History of lymphoma   . History of pulmonary embolus (PE)   . Incisional hernia   . Morbid obesity ([place])   . OSA on CPAP    cpap  10 yrs  . Varicose veins of lower extremities    pe    Tobacco History: Social History   Tobacco Use  Smoking Status Former Smoker  . Packs/day: 2.00  . Years: 20.00  . Pack years: 40.00  . Types: Cigarettes  . Quit date: 05/05/1984  . Years since quitting: 34.8  Smokeless Tobacco Never Used   Counseling given: Not Answered   Outpatient Medications Prior to Visit  Medication Sig Dispense Refill  . acetaminophen (TYLENOL) 500 MG tablet Take 1,000 mg by mouth every 6 (six) hours as needed for mild pain.    . calcium carbonate (OS-CAL) 600 MG TABS Take 600 mg by mouth every morning.     . cephALEXin (KEFLEX) 500 MG capsule TAKE 1 TO 2 CAPSULES BY MOUTH TWICE A DAY FOR 90 DAYS  3  . cholecalciferol (VITAMIN D) 1000 UNITS tablet Take 1,000 Units by mouth every morning.     . citalopram (CELEXA) 20 MG tablet Take by mouth.    Marland Kitchen LORazepam (ATIVAN) 0.5 MG  tablet TAKE 1 TO 2 TABLETS BY MOUTH AS NEEDED EVERY 12 HOURS  1  . Magnesium 400 MG CAPS Take 400 mg by mouth daily.     . Multiple Vitamin (THERAGRAN PO) Take 1 tablet by mouth daily.    . Nutritional Supplements (SALMON OIL) CAPS Take 1 capsule by mouth daily.    . rivaroxaban (XARELTO) 20 MG TABS tablet Take 1 tablet (20 mg total) by mouth daily. 90 tablet 3  . terbinafine (LAMISIL) 1 % cream Apply 1 application topically 2 (two) times daily.    Marland Kitchen VIAGRA 100 MG tablet TAKE 1 TABLET BY MOUTH EVERY DAY AS NEEDED FOR ERECTILE DYSFUNCTION . SPLIT TABLET IN HALF AS NEEDED 10 tablet 0  . doxycycline (VIBRA-TABS) 100  MG tablet Take by mouth.    . terbinafine (LAMISIL) 250 MG tablet      No facility-administered medications prior to visit.    Review of Systems  Review of Systems  Constitutional: Negative.   Respiratory: Negative.   Cardiovascular: Negative.   Psychiatric/Behavioral: Negative.    Physical Exam  BP 130/78 (BP Location: Left Arm, Cuff Size: Large)   Pulse 66   Temp 98.2 F (36.8 C) (Temporal)   Ht 5' 10.79" (1.798 m)   Wt (!) 337 lb (152.9 kg)   SpO2 98%   BMI 47.29 kg/m  Physical Exam Constitutional:      General: He is not in acute distress.    Appearance: Normal appearance. He is obese. He is not ill-appearing.  HENT:     Head: Normocephalic and atraumatic.     Mouth/Throat:     Comments: Deferred d/t masking Cardiovascular:     Rate and Rhythm: Normal rate and regular rhythm.  Pulmonary:     Effort: Pulmonary effort is normal.     Breath sounds: Normal breath sounds.     Comments: CTA Musculoskeletal: Normal range of motion.  Skin:    General: Skin is warm and dry.  Neurological:     General: No focal deficit present.     Mental Status: He is alert and oriented to person, place, and time. Mental status is at baseline.  Psychiatric:        Mood and Affect: Mood normal.        Behavior: Behavior normal.        Thought Content: Thought content normal.        Judgment: Judgment normal.      Lab Results:  CBC    Component Value Date/Time   WBC 6.0 09/12/2016 0954   WBC 6.5 11/28/2015 0932   RBC 4.73 09/12/2016 0954   RBC 4.27 11/28/2015 0932   HGB 14.1 09/12/2016 0954   HCT 43.4 09/12/2016 0954   PLT 307 09/12/2016 0954   PLT 325 06/03/2016 0918   MCV 91.8 09/12/2016 0954   MCH 29.8 09/12/2016 0954   MCH 31.1 11/28/2015 0932   MCHC 32.5 09/12/2016 0954   MCHC 34.2 11/28/2015 0932   RDW 14.9 (H) 09/12/2016 0954   LYMPHSABS 1.0 09/12/2016 0954   MONOABS 0.9 09/12/2016 0954   EOSABS 0.4 09/12/2016 0954   BASOSABS 0.1 09/12/2016 0954    BMET     Component Value Date/Time   NA 142 09/12/2016 0954   K 4.9 09/12/2016 0954   CL 99 06/03/2016 0918   CL 104 09/16/2012 0856   CO2 27 09/12/2016 0954   GLUCOSE 98 09/12/2016 0954   GLUCOSE 96 09/16/2012 0856   BUN 17.5 09/12/2016 0954  CREATININE 0.8 09/12/2016 0954   CALCIUM 9.9 09/12/2016 0954   GFRNONAA 90 06/03/2016 0918   GFRNONAA 61 03/28/2015 0927   GFRAA 104 06/03/2016 0918   GFRAA 70 03/28/2015 0927    BNP No results found for: BNP  ProBNP No results found for: PROBNP  Imaging: No results found.   Assessment & Plan:   Obstructive sleep apnea - Well controlled on CPAP; 100% compliant with use - No issues with mask fit or pressure setting - Pressure 4-10cm h20; AHI 0.6 - Advised not to drive if experiencing excessive daytime fatigue or somnolence  - Encourage weight loss and physical activity - FU in 1 year with Dr. Kaleen Mask, NP 03/14/2019

## 2019-04-23 DIAGNOSIS — S66124A Laceration of flexor muscle, fascia and tendon of right ring finger at wrist and hand level, initial encounter: Secondary | ICD-10-CM | POA: Insufficient documentation

## 2019-04-23 DIAGNOSIS — S62604B Fracture of unspecified phalanx of right ring finger, initial encounter for open fracture: Secondary | ICD-10-CM | POA: Insufficient documentation

## 2019-04-23 DIAGNOSIS — S52501A Unspecified fracture of the lower end of right radius, initial encounter for closed fracture: Secondary | ICD-10-CM | POA: Insufficient documentation

## 2019-04-23 DIAGNOSIS — S61214A Laceration without foreign body of right ring finger without damage to nail, initial encounter: Secondary | ICD-10-CM | POA: Diagnosis not present

## 2019-05-05 ENCOUNTER — Ambulatory Visit (INDEPENDENT_AMBULATORY_CARE_PROVIDER_SITE_OTHER): Payer: Medicare Other | Admitting: Orthopaedic Surgery

## 2019-05-05 ENCOUNTER — Other Ambulatory Visit: Payer: Self-pay

## 2019-05-05 ENCOUNTER — Encounter: Payer: Self-pay | Admitting: Orthopaedic Surgery

## 2019-05-05 DIAGNOSIS — S61218A Laceration without foreign body of other finger without damage to nail, initial encounter: Secondary | ICD-10-CM | POA: Insufficient documentation

## 2019-05-05 DIAGNOSIS — S61214A Laceration without foreign body of right ring finger without damage to nail, initial encounter: Secondary | ICD-10-CM | POA: Diagnosis not present

## 2019-05-05 NOTE — Progress Notes (Signed)
Office Visit Note   Patient: Marc Mills           Date of Birth: 06/22/1945           MRN: [id number] Visit Date: 05/05/2019              Requested by: Josetta Huddle, MD [address] [place],   [id number] PCP: Josetta Huddle, MD   Assessment & Plan: Visit Diagnoses:  1. Laceration of right ring finger without foreign body without damage to nail, initial encounter     Plan: Marc Mills was visiting in [place] when he fell 2 weeks ago and lacerated his right ring finger and injured his right wrist.  The laceration was sutured and is here to have sutures removed.  He has been taking Keflex for multiple years without evidence of infection.  He also injured his wrist and was told that he may have ligament injury.  I did review his films that he does have a comminuted yet minimally displaced fracture of the tuft of the distal phalanx of the ring finger and some widening of the scapholunate joint on his wrist.  He also has degenerative changes within the carpus and and that finding may be old.  He will wear the splint and if no improvement over the next month would like him to return  Follow-Up Instructions: Return if symptoms worsen or fail to improve.   Orders:  No orders of the defined types were placed in this encounter.  No orders of the defined types were placed in this encounter.     Procedures: No procedures performed   Clinical Data: No additional findings.   Subjective: Chief Complaint  Patient presents with  . Right Hand - Pain  Patient presents today for his right hand injury. He was in [place] and fell on 04/23/2019. He went to an urgent care and had to get stitches in his right ring finger. He was told that he may have some ligament damage in his wrist. Each day he notices more improvement. He was also told that he fractured his ring finger. He brought a copy of those x-rays with him today. He is taking tylenol as needed for pain. He is right  hand dominant.  I reviewed the films on the disc that he provided.  There is a comminuted minimally displaced fracture of the distal phalanx the ring finger.  There are degenerative changes about the radiocarpal joint and within the carpus of his right wrist.  There is widening of the scapholunate joint which may or may not be recent  HPI  Review of Systems   Objective: Vital Signs: Ht _0  (1.803 m)   Wt (!) 334 lb (151.5 kg)   BMI 46.58 kg/m   Physical Exam Constitutional:      Appearance: He is well-developed.  Eyes:     Pupils: Pupils are equal, round, and reactive to light.  Pulmonary:     Effort: Pulmonary effort is normal.  Skin:    General: Skin is warm and dry.  Neurological:     Mental Status: He is alert and oriented to person, place, and time.  Psychiatric:        Behavior: Behavior normal.     Ortho Exam laceration to the ring finger is on the volar surface and clean and intact.  I remove the stitches.  There is no deformity but still having some swelling about the distal tip of the finger with normal sensation.  Flexed as her tendons are intact.  There is some tenderness and some swelling in the dorsum of his right wrist in the area of the scapholunate joint.  There is little bit of swelling which may be hematoma as that he is on chronic blood thinner.  Excellent range of motion with minimal discomfort.  Specialty Comments:  No specialty comments available.  Imaging: No results found.   PMFS History: Patient Active Problem List   Diagnosis Date Noted  . Laceration of right ring finger 05/05/2019  . Left knee pain 06/14/2018  . Pain in right ankle and joints of right foot 12/18/2017  . Bilateral edema of lower extremity 10/30/2015  . Onychomycosis 10/30/2015  . Sepsis ([place]) 08/31/2015  . History of pulmonary embolism   . History of DVT (deep vein thrombosis)   . Pyrexia   . Cellulitis 03/18/2015  . Chronic anticoagulation 03/17/2015  . Morbid  obesity with BMI of 45.0-49.9, adult ([place]) 03/17/2015  . S/P IVC filter 03/17/2015  . Genetic testing 01/12/2015  . Postoperative wound infection 12/09/2013  . Postop check 11/24/2013  . Ulcer of lower limb, unspecified 08/24/2013  . Inguinal lymphadenopathy 08/16/2013  . Encounter for therapeutic drug monitoring 06/09/2013  . Cellulitis of left lower extremity 04/04/2013  . Osteoarthritis of left knee 11/02/2012  . Cellulitis of right lower extremity 04/30/2012  . 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 Status post splenectomy 04/29/2012  . Incisional hernia 05/27/2011  . Splenic marginal zone b-cell lymphoma ([place]) 04/16/2010  . Morbid obesity ([place]) 04/19/2008  . Obstructive sleep apnea 04/19/2008  . VARICOSE VEINS, LOWER EXTREMITIES 04/19/2008  . PULMONARY EMBOLISM, HX OF 04/19/2008   Past Medical History:  Diagnosis Date  . Anorectal fistula   . Arthritis   . Blood transfusion without reported diagnosis   . Cancer (HCC)    lymphoma hx  . Cataract   . Cellulitis 04/04/2013  . DVT (deep venous thrombosis) ([place])    2011 right leg  . History of lymphoma   . History of pulmonary embolus (PE)   . Incisional hernia   . Morbid obesity ([place])   . OSA on CPAP    cpap  10 yrs  . Varicose veins of lower extremities    pe    Family History  Problem Relation Age of Onset  . Heart disease Father   . Varicose Veins Father   . Heart attack Father        dx. 56s  . Heart disease Mother 28  . Varicose Veins Mother   . Pancreatic cancer Sister 53  . Cancer Maternal Grandmother        either stomach or pancreatic primary   . Pancreatic cancer Maternal Grandfather        dx. 55s  . Lung cancer Maternal Aunt        secondhand smoke exposure  . Heart attack Paternal Uncle   . Rectal cancer Maternal Aunt        dx. 44s  . Cancer Maternal Aunt        unknown type  . Diabetes Paternal Uncle   . Cancer Cousin        unknown type  . Breast cancer Cousin        dx. 92s     Past Surgical History:  Procedure Laterality Date  . EYE SURGERY Left 03   cat, detached ret    . FRACTURE SURGERY    . HERNIA REPAIR    . INGUINAL  LYMPH NODE BIOPSY [name] 11/15/2013   Procedure: RIGHT INGUINAL LYMPH NODE BIOPSY;  Surgeon: Gwenyth Ober, MD;  Location: [facility];  Service: General;  Laterality: [name];  . ivc filter    . PROSTATE SURGERY     (patient denies)  . SPLENECTOMY    . TONSILLECTOMY     age 76  . TOTAL KNEE ARTHROPLASTY Left 11/02/2012   Procedure: TOTAL KNEE ARTHROPLASTY with revision tibia;  Surgeon: Garald Balding, MD;  Location: [facility];  Service: Orthopedics;  Laterality: Left;   Social History   Occupational History  . Not on file  Tobacco Use  . Smoking status: Former Smoker    Packs/day: 2.00    Years: 20.00    Pack years: 40.00    Types: Cigarettes    Quit date: 05/05/1984    Years since quitting: 35.0  . Smokeless tobacco: Never Used  Substance and Sexual Activity  . Alcohol use: Yes    Alcohol/week: 0.0 standard drinks    Comment: maybe 1 beer/month  . Drug use: No  . Sexual activity: Yes    Birth control/protection: None

## 2019-05-18 DIAGNOSIS — L0390 Cellulitis, unspecified: Secondary | ICD-10-CM | POA: Diagnosis not present

## 2019-05-18 DIAGNOSIS — B351 Tinea unguium: Secondary | ICD-10-CM | POA: Diagnosis not present

## 2019-05-18 DIAGNOSIS — I878 Other specified disorders of veins: Secondary | ICD-10-CM | POA: Diagnosis not present

## 2019-05-18 DIAGNOSIS — R600 Localized edema: Secondary | ICD-10-CM | POA: Diagnosis not present

## 2019-05-18 DIAGNOSIS — Z9081 Acquired absence of spleen: Secondary | ICD-10-CM | POA: Diagnosis not present

## 2019-06-02 ENCOUNTER — Ambulatory Visit: Payer: Medicare Other

## 2019-06-10 ENCOUNTER — Ambulatory Visit: Payer: Medicare Other

## 2019-06-27 ENCOUNTER — Ambulatory Visit: Payer: Medicare Other

## 2019-06-28 ENCOUNTER — Ambulatory Visit: Payer: Medicare Other

## 2019-07-19 DIAGNOSIS — Z0000 Encounter for general adult medical examination without abnormal findings: Secondary | ICD-10-CM | POA: Diagnosis not present

## 2019-07-19 DIAGNOSIS — Z79899 Other long term (current) drug therapy: Secondary | ICD-10-CM | POA: Diagnosis not present

## 2019-07-19 DIAGNOSIS — I82409 Acute embolism and thrombosis of unspecified deep veins of unspecified lower extremity: Secondary | ICD-10-CM | POA: Diagnosis not present

## 2019-07-19 DIAGNOSIS — Z1389 Encounter for screening for other disorder: Secondary | ICD-10-CM | POA: Diagnosis not present

## 2019-07-19 DIAGNOSIS — G4733 Obstructive sleep apnea (adult) (pediatric): Secondary | ICD-10-CM | POA: Diagnosis not present

## 2019-07-19 DIAGNOSIS — Z6841 Body Mass Index (BMI) 40.0 and over, adult: Secondary | ICD-10-CM | POA: Diagnosis not present

## 2019-07-19 DIAGNOSIS — Z86010 Personal history of colonic polyps: Secondary | ICD-10-CM | POA: Diagnosis not present

## 2019-07-19 DIAGNOSIS — Z8579 Personal history of other malignant neoplasms of lymphoid, hematopoietic and related tissues: Secondary | ICD-10-CM | POA: Diagnosis not present

## 2019-07-19 DIAGNOSIS — L03119 Cellulitis of unspecified part of limb: Secondary | ICD-10-CM | POA: Diagnosis not present

## 2019-07-19 DIAGNOSIS — Z125 Encounter for screening for malignant neoplasm of prostate: Secondary | ICD-10-CM | POA: Diagnosis not present

## 2019-07-19 DIAGNOSIS — E559 Vitamin D deficiency, unspecified: Secondary | ICD-10-CM | POA: Diagnosis not present

## 2019-07-19 DIAGNOSIS — G250 Essential tremor: Secondary | ICD-10-CM | POA: Diagnosis not present

## 2019-07-19 DIAGNOSIS — G629 Polyneuropathy, unspecified: Secondary | ICD-10-CM | POA: Diagnosis not present

## 2019-07-19 DIAGNOSIS — N529 Male erectile dysfunction, unspecified: Secondary | ICD-10-CM | POA: Diagnosis not present

## 2019-07-19 DIAGNOSIS — F418 Other specified anxiety disorders: Secondary | ICD-10-CM | POA: Diagnosis not present

## 2019-07-20 DIAGNOSIS — I878 Other specified disorders of veins: Secondary | ICD-10-CM | POA: Diagnosis not present

## 2019-07-20 DIAGNOSIS — Z9081 Acquired absence of spleen: Secondary | ICD-10-CM | POA: Diagnosis not present

## 2019-07-20 DIAGNOSIS — R600 Localized edema: Secondary | ICD-10-CM | POA: Diagnosis not present

## 2019-07-20 DIAGNOSIS — L0390 Cellulitis, unspecified: Secondary | ICD-10-CM | POA: Diagnosis not present

## 2019-08-10 DIAGNOSIS — R3129 Other microscopic hematuria: Secondary | ICD-10-CM | POA: Diagnosis not present

## 2019-09-14 DIAGNOSIS — Z86010 Personal history of colonic polyps: Secondary | ICD-10-CM | POA: Diagnosis not present

## 2019-10-13 DIAGNOSIS — Z1159 Encounter for screening for other viral diseases: Secondary | ICD-10-CM | POA: Diagnosis not present

## 2019-10-18 DIAGNOSIS — Z86010 Personal history of colonic polyps: Secondary | ICD-10-CM | POA: Diagnosis not present

## 2019-10-18 DIAGNOSIS — K5730 Diverticulosis of large intestine without perforation or abscess without bleeding: Secondary | ICD-10-CM | POA: Diagnosis not present

## 2019-10-18 DIAGNOSIS — K644 Residual hemorrhoidal skin tags: Secondary | ICD-10-CM | POA: Diagnosis not present

## 2019-10-18 DIAGNOSIS — K635 Polyp of colon: Secondary | ICD-10-CM | POA: Diagnosis not present

## 2019-10-18 DIAGNOSIS — K641 Second degree hemorrhoids: Secondary | ICD-10-CM | POA: Diagnosis not present

## 2019-10-21 DIAGNOSIS — K635 Polyp of colon: Secondary | ICD-10-CM | POA: Diagnosis not present

## 2019-11-23 DIAGNOSIS — R600 Localized edema: Secondary | ICD-10-CM | POA: Diagnosis not present

## 2019-11-23 DIAGNOSIS — L0390 Cellulitis, unspecified: Secondary | ICD-10-CM | POA: Diagnosis not present

## 2019-11-23 DIAGNOSIS — Z9081 Acquired absence of spleen: Secondary | ICD-10-CM | POA: Diagnosis not present

## 2019-12-05 DIAGNOSIS — H524 Presbyopia: Secondary | ICD-10-CM | POA: Diagnosis not present

## 2019-12-05 DIAGNOSIS — H40013 Open angle with borderline findings, low risk, bilateral: Secondary | ICD-10-CM | POA: Diagnosis not present

## 2019-12-05 DIAGNOSIS — H31002 Unspecified chorioretinal scars, left eye: Secondary | ICD-10-CM | POA: Diagnosis not present

## 2019-12-05 DIAGNOSIS — H5213 Myopia, bilateral: Secondary | ICD-10-CM | POA: Diagnosis not present

## 2019-12-19 ENCOUNTER — Other Ambulatory Visit: Payer: Self-pay

## 2019-12-19 ENCOUNTER — Ambulatory Visit (INDEPENDENT_AMBULATORY_CARE_PROVIDER_SITE_OTHER): Payer: Medicare Other | Admitting: Podiatry

## 2019-12-19 DIAGNOSIS — M79675 Pain in left toe(s): Secondary | ICD-10-CM | POA: Diagnosis not present

## 2019-12-19 DIAGNOSIS — M79674 Pain in right toe(s): Secondary | ICD-10-CM | POA: Diagnosis not present

## 2019-12-19 DIAGNOSIS — L989 Disorder of the skin and subcutaneous tissue, unspecified: Secondary | ICD-10-CM | POA: Diagnosis not present

## 2019-12-19 DIAGNOSIS — G629 Polyneuropathy, unspecified: Secondary | ICD-10-CM | POA: Diagnosis not present

## 2019-12-19 DIAGNOSIS — B351 Tinea unguium: Secondary | ICD-10-CM | POA: Diagnosis not present

## 2019-12-19 MED ORDER — GABAPENTIN 100 MG PO CAPS: 100.0000 mg | ORAL_CAPSULE | Freq: Three times a day (TID) | ORAL | 3 refills | Status: DC
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
## 2019-12-20 DIAGNOSIS — Z23 Encounter for immunization: Secondary | ICD-10-CM | POA: Diagnosis not present

## 2020-01-18 ENCOUNTER — Encounter: Payer: Self-pay | Admitting: Orthopaedic Surgery

## 2020-01-18 ENCOUNTER — Ambulatory Visit (INDEPENDENT_AMBULATORY_CARE_PROVIDER_SITE_OTHER): Payer: Medicare Other | Admitting: Orthopaedic Surgery

## 2020-01-18 ENCOUNTER — Other Ambulatory Visit: Payer: Self-pay

## 2020-01-18 VITALS — Ht 71.0 in | Wt 333.0 lb

## 2020-01-18 DIAGNOSIS — M25571 Pain in right ankle and joints of right foot: Secondary | ICD-10-CM | POA: Diagnosis not present

## 2020-01-18 MED ORDER — BUPIVACAINE HCL 0.5 % IJ SOLN
1.0000 mL | INTRAMUSCULAR | Status: AC | PRN
  Administered 2020-01-18: 1 mL via INTRA_ARTICULAR

## 2020-01-18 MED ORDER — LIDOCAINE HCL 1 % IJ SOLN
2.0000 mL | INTRAMUSCULAR | Status: AC | PRN
  Administered 2020-01-18: 2 mL

## 2020-01-18 MED ORDER — METHYLPREDNISOLONE ACETATE 40 MG/ML IJ SUSP
40.0000 mg | INTRAMUSCULAR | Status: AC | PRN
  Administered 2020-01-18: 40 mg via INTRA_ARTICULAR

## 2020-01-18 NOTE — Progress Notes (Signed)
Office Visit Note   Patient: Marc Mills           Date of Birth: 1946-03-05           MRN: [id number] Visit Date: 01/18/2020              Requested by: Josetta Huddle, MD [address] [place],  [place] [address] PCP: Josetta Huddle, MD   Assessment & Plan: Visit Diagnoses:  1. Pain in right ankle and joints of right foot     Plan: Recurrent symptoms of traumatic osteoarthritis right ankle.  Will inject with cortisone as it has helped in the past  Follow-Up Instructions: Return if symptoms worsen or fail to improve.   Orders:  Orders Placed This Encounter  Procedures  . Medium Joint Inj   No orders of the defined types were placed in this encounter.     Procedures: Medium Joint Inj on 01/18/2020 2:58 PM Details: 27 G needle, anterolateral approach Medications: 2 mL lidocaine 1 %; 1 mL bupivacaine 0.5 %; 40 mg methylPREDNISolone acetate 40 MG/ML      Clinical Data: No additional findings.   Subjective: Chief Complaint  Patient presents with  . Right Ankle - Pain  Patient presents today for chronic recurrent right ankle pain. He had an injection last on 06/14/2018 and wants to get another one today. He has pain that radiates from his ankle, into his foot. He states that it does not matter if he is weightbearing or not. He has swelling throughout his right leg. He takes Tylenol as needed.  Prior films demonstrates diffuse osteoarthritis in the right ankle from prior trauma.  He has had open reduction internal fixation of both distal tibia and fibula  HPI  Review of Systems   Objective: Vital Signs: Ht _0  (1.803 m)   Wt (!) 333 lb (151 kg)   BMI 46.44 kg/m   Physical Exam Constitutional:      Appearance: He is well-developed.  Eyes:     Pupils: Pupils are equal, round, and reactive to light.  Pulmonary:     Effort: Pulmonary effort is normal.  Skin:    General: Skin is warm and dry.  Neurological:     Mental Status: He is alert  and oriented to person, place, and time.  Psychiatric:        Behavior: Behavior normal.     Ortho Exam awake alert and oriented x3.  Comfortable sitting.  Large legs.  Does have some nonpitting edema both ankles with venous stasis changes.  Some limitation of motion of his right ankle in flexion extension with some mild tenderness mostly anterolaterally. Specialty Comments:  No specialty comments available.  Imaging: No results found.   PMFS History: Patient Active Problem List   Diagnosis Date Noted  . Laceration of right ring finger 05/05/2019  . Left knee pain 06/14/2018  . Pain in right ankle and joints of right foot 12/18/2017  . Bilateral edema of lower extremity 10/30/2015  . Onychomycosis 10/30/2015  . Sepsis ([place]) 08/31/2015  . History of pulmonary embolism   . History of DVT (deep vein thrombosis)   . Pyrexia   . Cellulitis 03/18/2015  . Chronic anticoagulation 03/17/2015  . Morbid obesity with BMI of 45.0-49.9, adult ([place]) 03/17/2015  . S/P IVC filter 03/17/2015  . Genetic testing 01/12/2015  . Postoperative wound infection 12/09/2013  . Postop check 11/24/2013  . Ulcer of lower limb, unspecified 08/24/2013  . Inguinal lymphadenopathy 08/16/2013  .  Encounter for therapeutic drug monitoring 06/09/2013  . Cellulitis of left lower extremity 04/04/2013  . Osteoarthritis of left knee 11/02/2012  . Cellulitis of right lower extremity 04/30/2012  . 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 Status post splenectomy 04/29/2012  . Incisional hernia 05/27/2011  . Splenic marginal zone b-cell lymphoma ([place]) 04/16/2010  . Morbid obesity ([facility] [facility]) 04/19/2008  . Obstructive sleep apnea 04/19/2008  . VARICOSE VEINS, LOWER EXTREMITIES 04/19/2008  . PULMONARY EMBOLISM, HX OF 04/19/2008   Past Medical History:  Diagnosis Date  . Anorectal fistula   . Arthritis   . Blood transfusion without reported diagnosis   . Cancer (HCC)    lymphoma hx  . Cataract   . Cellulitis  04/04/2013  . DVT (deep venous thrombosis) ([place])    2011 right leg  . History of lymphoma   . History of pulmonary embolus (PE)   . Incisional hernia   . Morbid obesity ([place])   . OSA on CPAP    cpap  10 yrs  . Varicose veins of lower extremities    pe    Family History  Problem Relation Age of Onset  . Heart disease Father   . Varicose Veins Father   . Heart attack Father        dx. 67s  . Heart disease Mother 4  . Varicose Veins Mother   . Pancreatic cancer Sister 82  . Cancer Maternal Grandmother        either stomach or pancreatic primary   . Pancreatic cancer Maternal Grandfather        dx. 32s  . Lung cancer Maternal Aunt        secondhand smoke exposure  . Heart attack Paternal Uncle   . Rectal cancer Maternal Aunt        dx. 23s  . Cancer Maternal Aunt        unknown type  . Diabetes Paternal Uncle   . Cancer Cousin        unknown type  . Breast cancer Cousin        dx. 104s    Past Surgical History:  Procedure Laterality Date  . EYE SURGERY Left 03   cat, detached ret    . FRACTURE SURGERY    . HERNIA REPAIR    . INGUINAL LYMPH NODE BIOPSY [name] 11/15/2013   Procedure: RIGHT INGUINAL LYMPH NODE BIOPSY;  Surgeon: Gwenyth Ober, MD;  Location: [facility];  Service: General;  Laterality: [name];  . ivc filter    . PROSTATE SURGERY     (patient denies)  . SPLENECTOMY    . TONSILLECTOMY     age 4  . TOTAL KNEE ARTHROPLASTY Left 11/02/2012   Procedure: TOTAL KNEE ARTHROPLASTY with revision tibia;  Surgeon: Garald Balding, MD;  Location: [facility];  Service: Orthopedics;  Laterality: Left;   Social History   Occupational History  . Not on file  Tobacco Use  . Smoking status: Former Smoker    Packs/day: 2.00    Years: 20.00    Pack years: 40.00    Types: Cigarettes    Quit date: 05/05/1984    Years since quitting: 35.7  . Smokeless tobacco: Never Used  Vaping Use  . Vaping Use: Never used  Substance and Sexual Activity  . Alcohol use: Yes    Alcohol/week: 0.0  standard drinks    Comment: maybe 1 beer/month  . Drug use: No  . Sexual activity: Yes    Birth control/protection:  None

## 2020-01-23 DIAGNOSIS — T1512XA Foreign body in conjunctival sac, left eye, initial encounter: Secondary | ICD-10-CM | POA: Diagnosis not present

## 2020-02-13 DIAGNOSIS — Z23 Encounter for immunization: Secondary | ICD-10-CM | POA: Diagnosis not present
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
## 2020-03-13 ENCOUNTER — Ambulatory Visit (INDEPENDENT_AMBULATORY_CARE_PROVIDER_SITE_OTHER): Payer: Medicare Other | Admitting: Internal Medicine

## 2020-03-13 ENCOUNTER — Other Ambulatory Visit: Payer: Self-pay

## 2020-03-13 ENCOUNTER — Encounter: Payer: Self-pay | Admitting: Internal Medicine

## 2020-03-13 VITALS — BP 128/80 | HR 81 | Temp 97.9°F | Ht 71.0 in | Wt 338.4 lb

## 2020-03-13 DIAGNOSIS — Z6841 Body Mass Index (BMI) 40.0 and over, adult: Secondary | ICD-10-CM

## 2020-03-13 DIAGNOSIS — G4733 Obstructive sleep apnea (adult) (pediatric): Secondary | ICD-10-CM

## 2020-03-13 NOTE — Assessment & Plan Note (Signed)
In spite of regular swimming, he remains morbidly obese. Teaches cooking class.

## 2020-03-13 NOTE — Assessment & Plan Note (Signed)
Benefits from CPAP with continued good compliance and control Plan replace old machine auto 4-10

## 2020-03-13 NOTE — Patient Instructions (Signed)
Order- DME [facility]- please replace old CPAP machine- auto 4-10, mask of choice, humidifier, supplies, AirView/ card  Please cal if we can help

## 2020-03-26 ENCOUNTER — Other Ambulatory Visit: Payer: Self-pay

## 2020-03-26 ENCOUNTER — Ambulatory Visit (INDEPENDENT_AMBULATORY_CARE_PROVIDER_SITE_OTHER): Payer: Medicare Other | Admitting: Podiatry

## 2020-03-26 DIAGNOSIS — B351 Tinea unguium: Secondary | ICD-10-CM | POA: Diagnosis not present

## 2020-03-26 DIAGNOSIS — M7751 Other enthesopathy of right foot: Secondary | ICD-10-CM

## 2020-03-26 DIAGNOSIS — M79674 Pain in right toe(s): Secondary | ICD-10-CM

## 2020-03-26 DIAGNOSIS — M79675 Pain in left toe(s): Secondary | ICD-10-CM | POA: Diagnosis not present

## 2020-03-26 DIAGNOSIS — M19071 Primary osteoarthritis, right ankle and foot: Secondary | ICD-10-CM

## 2020-03-27 ENCOUNTER — Ambulatory Visit (INDEPENDENT_AMBULATORY_CARE_PROVIDER_SITE_OTHER): Payer: Medicare Other

## 2020-03-27 ENCOUNTER — Ambulatory Visit (INDEPENDENT_AMBULATORY_CARE_PROVIDER_SITE_OTHER): Payer: Medicare Other | Admitting: Orthopaedic Surgery

## 2020-03-27 ENCOUNTER — Encounter: Payer: Self-pay | Admitting: Orthopaedic Surgery

## 2020-03-27 DIAGNOSIS — M25551 Pain in right hip: Secondary | ICD-10-CM

## 2020-03-27 DIAGNOSIS — M25552 Pain in left hip: Secondary | ICD-10-CM | POA: Insufficient documentation

## 2020-03-27 MED ORDER — LIDOCAINE HCL 1 % IJ SOLN
2.0000 mL | INTRAMUSCULAR | Status: AC | PRN
  Administered 2020-03-27: 2 mL

## 2020-03-27 MED ORDER — BUPIVACAINE HCL 0.5 % IJ SOLN
2.0000 mL | INTRAMUSCULAR | Status: AC | PRN
  Administered 2020-03-27: 2 mL via INTRA_ARTICULAR

## 2020-03-27 MED ORDER — METHYLPREDNISOLONE ACETATE 40 MG/ML IJ SUSP
80.0000 mg | INTRAMUSCULAR | Status: AC | PRN
  Administered 2020-03-27: 80 mg via INTRA_ARTICULAR

## 2020-03-27 NOTE — Progress Notes (Signed)
 Office Visit Note   Patient: Marc Mills           Date of Birth: 04/03/1946           MRN: [id number] Visit Date: 03/27/2020              Requested by: Gates, Robert, MD [address] [place],  [place] [address] PCP: Gates, Robert, MD   Assessment & Plan: Visit Diagnoses:  1. Pain in right hip     Plan: [name] been experiencing lateral right hip pain for several weeks after standing for long period of time.  No specific injury or trauma.  He is not experience any groin or anterior thigh pain.  Also not having any specific back discomfort.  No related numbness or tingling.  X-rays demonstrate some degenerative change of the hip.  From a diagnostic and therapeutic standpoint I will inject the greater trochanter i.e. area of pain and monitor response  Follow-Up Instructions: Return if symptoms worsen or fail to improve.   Orders:  Orders Placed This Encounter  Procedures  . XR HIP UNILAT W OR W/O PELVIS 2-3 VIEWS RIGHT   No orders of the defined types were placed in this encounter.     Procedures: Large Joint Inj: R greater trochanter on 03/27/2020 10:34 AM Indications: pain and diagnostic evaluation Details: 25 G 1.5 in needle, lateral approach  Arthrogram: No  Medications: 2 mL lidocaine 1 %; 2 mL bupivacaine 0.5 %; 80 mg methylPREDNISolone acetate 40 MG/ML Procedure, treatment alternatives, risks and benefits explained, specific risks discussed. Consent was given by the patient. Immediately prior to procedure a time out was called to verify the correct patient, procedure, equipment, support staff and site/side marked as required. Patient was prepped and draped in the usual sterile fashion.       Clinical Data: No additional findings.   Subjective: Chief Complaint  Patient presents with  . Right Hip - Pain  Several week history of lateral right hip pain after standing for long period of time.  Pain is fairly well localized in the area of the  greater trochanter with burning.  No referred pain along the lateral thigh.  No groin or anterior thigh pain.  No numbness or tingling or back pain  HPI  Review of Systems   Objective: Vital Signs: There were no vitals taken for this visit.  Physical Exam Constitutional:      Appearance: He is well-developed.  Eyes:     Pupils: Pupils are equal, round, and reactive to light.  Pulmonary:     Effort: Pulmonary effort is normal.  Skin:    General: Skin is warm and dry.  Neurological:     Mental Status: He is alert and oriented to person, place, and time.  Psychiatric:        Behavior: Behavior normal.     Ortho Exam awake alert and oriented x3.  Comfortable sitting.  No limp.  Does have specific pain over the tip of the greater trochanter of his right hip.  No particular pain with internal or external rotation of the right hip.  May be slight loss of motion of the right compared to the left hip on extremes of internal and external rotation.  No percussible tenderness of lumbar spine  Specialty Comments:  No specialty comments available.  Imaging: XR HIP UNILAT W OR W/O PELVIS 2-3 VIEWS RIGHT  Result Date: 03/27/2020 AP the pelvis and lateral of the right hip were obtained.    There are some degenerative changes in the right hip with subchondral sclerosis in the acetabulum and slight narrowing of the superior joint space.  There is also ectopic calcification along the lateral acetabulum.  No acute changes.  Prior ORIF of femur fracture with retained hardware.  No obvious problem.  No abnormality over the greater trochanter with the patient is symptomatic there is some calcification of the capsule.  Some detail obliterated by bowel gas    PMFS History: Patient Active Problem List   Diagnosis Date Noted  . Pain in right hip 03/27/2020  . Laceration of right ring finger 05/05/2019  . Left knee pain 06/14/2018  . Pain in right ankle and joints of right foot 12/18/2017  . Bilateral  edema of lower extremity 10/30/2015  . Onychomycosis 10/30/2015  . Sepsis ([facility]) 08/31/2015  . History of pulmonary embolism   . History of DVT (deep vein thrombosis)   . Pyrexia   . Cellulitis 03/18/2015  . Chronic anticoagulation 03/17/2015  . Morbid obesity with BMI of 45.0-49.9, adult ([facility]) 03/17/2015  . S/P IVC filter 03/17/2015  . Genetic testing 01/12/2015  . Postoperative wound infection 12/09/2013  . Postop check 11/24/2013  . Ulcer of lower limb, unspecified 08/24/2013  . Inguinal lymphadenopathy 08/16/2013  . Encounter for therapeutic drug monitoring 06/09/2013  . Cellulitis of left lower extremity 04/04/2013  . Osteoarthritis of left knee 11/02/2012  . Cellulitis of right lower extremity 04/30/2012  . 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 Status post splenectomy 04/29/2012  . Incisional hernia 05/27/2011  . 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  . Morbid obesity ([facility]) 04/19/2008  . Obstructive sleep apnea 04/19/2008  . VARICOSE VEINS, LOWER EXTREMITIES 04/19/2008  . PULMONARY EMBOLISM, HX OF 04/19/2008   Past Medical History:  Diagnosis Date  . Anorectal fistula   . Arthritis   . Blood transfusion without reported diagnosis   . Cancer (HCC)    lymphoma hx  . Cataract   . Cellulitis 04/04/2013  . DVT (deep venous thrombosis) ([facility])    2011 right leg  . History of lymphoma   . History of pulmonary embolus (PE)   . Incisional hernia   . Morbid obesity ([facility])   . OSA on CPAP    cpap  10 yrs  . Varicose veins of lower extremities    pe    Family History  Problem Relation Age of Onset  . Heart disease Father   . Varicose Veins Father   . Heart attack Father        dx. 70s  . Heart disease Mother 80  . Varicose Veins Mother   . Pancreatic cancer Sister 78  . Cancer Maternal Grandmother        either stomach or pancreatic primary   . Pancreatic cancer Maternal Grandfather        dx. 50s  . Lung cancer Maternal Aunt         secondhand smoke exposure  . Heart attack Paternal Uncle   . Rectal cancer Maternal Aunt        dx. 80s  . Cancer Maternal Aunt        unknown type  . Diabetes Paternal Uncle   . Cancer Cousin        unknown type  . Breast cancer Cousin        dx. 50s    Past Surgical History:  Procedure Laterality Date  . EYE SURGERY Left 03   cat, detached ret    .   FRACTURE SURGERY    . HERNIA REPAIR    . INGUINAL LYMPH NODE BIOPSY [name] 11/15/2013   Procedure: RIGHT INGUINAL LYMPH NODE BIOPSY;  Surgeon: Gwenyth Ober, MD;  Location: [facility];  Service: General;  Laterality: [name];  . ivc filter    . PROSTATE SURGERY     (patient denies)  . SPLENECTOMY    . TONSILLECTOMY     age 29  . TOTAL KNEE ARTHROPLASTY Left 11/02/2012   Procedure: TOTAL KNEE ARTHROPLASTY with revision tibia;  Surgeon: Garald Balding, MD;  Location: [facility];  Service: Orthopedics;  Laterality: Left;   Social History   Occupational History  . Not on file  Tobacco Use  . Smoking status: Former Smoker    Packs/day: 2.00    Years: 20.00    Pack years: 40.00    Types: Cigarettes    Quit date: 05/05/1984    Years since quitting: 35.9  . Smokeless tobacco: Never Used  Vaping Use  . Vaping Use: Never used  Substance and Sexual Activity  . Alcohol use: Yes    Alcohol/week: 0.0 standard drinks    Comment: maybe 1 beer/month  . Drug use: No  . Sexual activity: Yes    Birth control/protection: None     Garald Balding, MD   Note - This record has been created using [employer].  Chart creation errors have been sought, but may not always  have been located. Such creation errors do not reflect on  the standard of medical care.
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
## 2020-07-02 ENCOUNTER — Ambulatory Visit (INDEPENDENT_AMBULATORY_CARE_PROVIDER_SITE_OTHER): Payer: Medicare Other | Admitting: Podiatry

## 2020-07-02 ENCOUNTER — Other Ambulatory Visit: Payer: Self-pay

## 2020-07-02 DIAGNOSIS — M79674 Pain in right toe(s): Secondary | ICD-10-CM

## 2020-07-02 DIAGNOSIS — M79675 Pain in left toe(s): Secondary | ICD-10-CM | POA: Diagnosis not present

## 2020-07-02 DIAGNOSIS — M7751 Other enthesopathy of right foot: Secondary | ICD-10-CM

## 2020-07-02 DIAGNOSIS — B351 Tinea unguium: Secondary | ICD-10-CM | POA: Diagnosis not present

## 2020-07-02 DIAGNOSIS — M19071 Primary osteoarthritis, right ankle and foot: Secondary | ICD-10-CM

## 2020-07-02 DIAGNOSIS — L989 Disorder of the skin and subcutaneous tissue, unspecified: Secondary | ICD-10-CM

## 2020-07-02 DIAGNOSIS — G629 Polyneuropathy, unspecified: Secondary | ICD-10-CM
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
## 2020-07-04 DIAGNOSIS — R600 Localized edema: Secondary | ICD-10-CM | POA: Diagnosis not present

## 2020-07-04 DIAGNOSIS — Z23 Encounter for immunization: Secondary | ICD-10-CM | POA: Diagnosis not present

## 2020-07-04 DIAGNOSIS — L0390 Cellulitis, unspecified: Secondary | ICD-10-CM | POA: Diagnosis not present

## 2020-07-04 DIAGNOSIS — Z9081 Acquired absence of spleen: Secondary | ICD-10-CM | POA: Diagnosis not present

## 2020-07-09 ENCOUNTER — Other Ambulatory Visit: Payer: Self-pay

## 2020-07-09 ENCOUNTER — Ambulatory Visit (INDEPENDENT_AMBULATORY_CARE_PROVIDER_SITE_OTHER): Payer: Medicare Other | Admitting: Podiatry

## 2020-07-09 DIAGNOSIS — M19071 Primary osteoarthritis, right ankle and foot: Secondary | ICD-10-CM

## 2020-07-09 DIAGNOSIS — M7751 Other enthesopathy of right foot: Secondary | ICD-10-CM

## 2020-07-09 MED ORDER — BETAMETHASONE SOD PHOS & ACET 6 (3-3) MG/ML IJ SUSP
3.0000 mg | Freq: Once | INTRAMUSCULAR | Status: AC
  Administered 2020-07-09: 3 mg via INTRA_ARTICULAR

## 2020-07-09 NOTE — Progress Notes (Signed)
   Subjective:  75 y.o. male presenting today after recently being seen on 06/24/2020.  Patient has a history of chronic right ankle pain.  Last visit a few weeks ago he declined any steroidal injection.  He states that his ankle is feeling good.  He states that as soon as he left he began to notice an increase of pain.  He would like to have a shot today.  He presents for further treatment evaluation   Past Medical History:  Diagnosis Date  . Anorectal fistula   . Arthritis   . Blood transfusion without reported diagnosis   . Cancer (HCC)    lymphoma hx  . Cataract   . Cellulitis 04/04/2013  . DVT (deep venous thrombosis) ([place])    2011 right leg  . History of lymphoma   . History of pulmonary embolus (PE)   . Incisional hernia   . Morbid obesity ([place])   . OSA on CPAP    cpap  10 yrs  . Varicose veins of lower extremities    pe     Objective / Physical Exam:  General:  The patient is alert and oriented x3 in no acute distress. Dermatology:  Skin is warm, dry and supple bilateral lower extremities. Negative for open lesions or macerations. Vascular:  Palpable pedal pulses bilaterally. No edema or erythema noted. Capillary refill within normal limits. Neurological:  Epicritic and protective threshold grossly intact bilaterally.  Musculoskeletal Exam:  Pain on palpation to the anterior lateral medial aspects of the patient's right ankle. Mild edema noted. Range of motion within normal limits to all pedal and ankle joints bilateral. Muscle strength 5/5 in all groups bilateral.   Assessment: 1.  Chronic DJD/capsulitis right ankle  Plan of Care:  1. Patient was evaluated.  2. Injection of 0.5 mL Celestone Soluspan injected in the patient's right ankle. 3.  Continue CBD oil right ankle which he states gives him some relief Four.  Return to clinic on a scheduled appointment for routine foot care  *Lives every other week in [place], [place] where he watches his granddaughter.  His  son is an ICU nurse in [employer] [place] [facility]  Dr. Edrick Kins, [place]                                        [place], [place] [address]                Office [phone]  Fax [phone]

## 2020-08-06 DIAGNOSIS — N529 Male erectile dysfunction, unspecified: Secondary | ICD-10-CM | POA: Diagnosis not present

## 2020-08-06 DIAGNOSIS — G4733 Obstructive sleep apnea (adult) (pediatric): Secondary | ICD-10-CM | POA: Diagnosis not present

## 2020-08-06 DIAGNOSIS — R3129 Other microscopic hematuria: Secondary | ICD-10-CM | POA: Diagnosis not present

## 2020-08-06 DIAGNOSIS — G629 Polyneuropathy, unspecified: Secondary | ICD-10-CM | POA: Diagnosis not present

## 2020-08-06 DIAGNOSIS — Z0000 Encounter for general adult medical examination without abnormal findings: Secondary | ICD-10-CM | POA: Diagnosis not present

## 2020-08-06 DIAGNOSIS — E559 Vitamin D deficiency, unspecified: Secondary | ICD-10-CM | POA: Diagnosis not present

## 2020-08-06 DIAGNOSIS — G250 Essential tremor: Secondary | ICD-10-CM | POA: Diagnosis not present

## 2020-08-06 DIAGNOSIS — Z6841 Body Mass Index (BMI) 40.0 and over, adult: Secondary | ICD-10-CM | POA: Diagnosis not present

## 2020-08-06 DIAGNOSIS — F419 Anxiety disorder, unspecified: Secondary | ICD-10-CM | POA: Diagnosis not present

## 2020-08-06 DIAGNOSIS — Z86010 Personal history of colonic polyps: Secondary | ICD-10-CM | POA: Diagnosis not present

## 2020-08-06 DIAGNOSIS — I82409 Acute embolism and thrombosis of unspecified deep veins of unspecified lower extremity: Secondary | ICD-10-CM | POA: Diagnosis not present

## 2020-08-06 DIAGNOSIS — F418 Other specified anxiety disorders: Secondary | ICD-10-CM | POA: Diagnosis not present

## 2020-08-06 DIAGNOSIS — Z8579 Personal history of other malignant neoplasms of lymphoid, hematopoietic and related tissues: Secondary | ICD-10-CM | POA: Diagnosis not present

## 2020-08-06 DIAGNOSIS — E538 Deficiency of other specified B group vitamins: Secondary | ICD-10-CM | POA: Diagnosis not present

## 2020-08-08 DIAGNOSIS — Z23 Encounter for immunization: Secondary | ICD-10-CM | POA: Diagnosis not present

## 2020-09-06 DIAGNOSIS — Z8579 Personal history of other malignant neoplasms of lymphoid, hematopoietic and related tissues: Secondary | ICD-10-CM | POA: Diagnosis not present

## 2020-09-17 ENCOUNTER — Emergency Department (HOSPITAL_BASED_OUTPATIENT_CLINIC_OR_DEPARTMENT_OTHER): Payer: Medicare Other

## 2020-09-17 ENCOUNTER — Encounter (HOSPITAL_BASED_OUTPATIENT_CLINIC_OR_DEPARTMENT_OTHER): Payer: Self-pay

## 2020-09-17 ENCOUNTER — Emergency Department (HOSPITAL_BASED_OUTPATIENT_CLINIC_OR_DEPARTMENT_OTHER)
Admission: EM | Admit: 2020-09-17 | Discharge: 2020-09-17 | Disposition: A | Discharge status: Home / Self Care | Payer: Medicare Other | Attending: Emergency Medicine | Admitting: Emergency Medicine

## 2020-09-17 ENCOUNTER — Other Ambulatory Visit: Payer: Self-pay

## 2020-09-17 DIAGNOSIS — S61313A Laceration without foreign body of left middle finger with damage to nail, initial encounter: Secondary | ICD-10-CM | POA: Insufficient documentation

## 2020-09-17 DIAGNOSIS — Z23 Encounter for immunization: Secondary | ICD-10-CM | POA: Insufficient documentation

## 2020-09-17 DIAGNOSIS — Y92009 Unspecified place in unspecified non-institutional (private) residence as the place of occurrence of the external cause: Secondary | ICD-10-CM | POA: Insufficient documentation

## 2020-09-17 DIAGNOSIS — W293XXA Contact with powered garden and outdoor hand tools and machinery, initial encounter: Secondary | ICD-10-CM | POA: Diagnosis not present

## 2020-09-17 DIAGNOSIS — Z8579 Personal history of other malignant neoplasms of lymphoid, hematopoietic and related tissues: Secondary | ICD-10-CM | POA: Insufficient documentation

## 2020-09-17 DIAGNOSIS — S6992XA Unspecified injury of left wrist, hand and finger(s), initial encounter: Secondary | ICD-10-CM | POA: Diagnosis present

## 2020-09-17 DIAGNOSIS — S62633A Displaced fracture of distal phalanx of left middle finger, initial encounter for closed fracture: Secondary | ICD-10-CM | POA: Diagnosis not present

## 2020-09-17 DIAGNOSIS — Z7901 Long term (current) use of anticoagulants: Secondary | ICD-10-CM | POA: Diagnosis not present

## 2020-09-17 DIAGNOSIS — S61319A Laceration without foreign body of unspecified finger with damage to nail, initial encounter: Secondary | ICD-10-CM

## 2020-09-17 DIAGNOSIS — Z87891 Personal history of nicotine dependence: Secondary | ICD-10-CM | POA: Insufficient documentation

## 2020-09-17 DIAGNOSIS — Z96652 Presence of left artificial knee joint: Secondary | ICD-10-CM | POA: Diagnosis not present

## 2020-09-17 DIAGNOSIS — S62633B Displaced fracture of distal phalanx of left middle finger, initial encounter for open fracture: Secondary | ICD-10-CM

## 2020-09-17 MED ORDER — TETANUS-DIPHTH-ACELL PERTUSSIS 5-2.5-18.5 LF-MCG/0.5 IM SUSY
0.5000 mL | PREFILLED_SYRINGE | Freq: Once | INTRAMUSCULAR | Status: AC
  Administered 2020-09-17: 0.5 mL via INTRAMUSCULAR
  Filled 2020-09-17: qty 0.5

## 2020-09-17 MED ORDER — LIDOCAINE HCL 2 % IJ SOLN
10.0000 mL | Freq: Once | INTRAMUSCULAR | Status: AC
  Administered 2020-09-17: 200 mg
  Filled 2020-09-17: qty 20

## 2020-09-17 NOTE — Discharge Instructions (Addendum)
Leave the bandage on until you see Dr. Lenon Curt.  Elevate your finger to help with the swelling and take Tylenol as needed for pain

## 2020-09-17 NOTE — ED Notes (Signed)
ED Provider at bedside. 

## 2020-09-17 NOTE — ED Notes (Signed)
Injury to distal middle finger cleaned to better characterize wound with EDP at bedside. Wound has avulsion of partial nail along with deep laceration that extends to the bone. Area irrigated and moist dressing re-applied.

## 2020-09-17 NOTE — ED Notes (Signed)
Pt ambulatory with steady gait to restroom 

## 2020-09-17 NOTE — ED Triage Notes (Signed)
Pt presents with an avulsion injury to L middle finger from a hedge trimmer. Pt placed hemostatic powder over wound at home. Bleeding controlled. Pressure bandage applied in triage.

## 2020-09-17 NOTE — ED Provider Notes (Signed)
[facility] EMERGENCY DEPT Provider Note   CSN: [id number] Arrival date & time: 09/17/20  1210     History Chief Complaint  Patient presents with  . Finger Injury    Marc Mills is a 75 y.o. male.  Patient is a 75 year old male with a history of lymphoma, DVT/PE on Xarelto who is presenting today after an injury to his left middle finger.  He was using his hedge tremors when it caught his finger and cut through the nail multiple times.  He denies any other injury.  He immediately put a quick clot solution on the finger.  He wrapped it up and he came here for further care.  He denies any sensation problems.  Tetanus shot was greater than 5 years ago.  The history is provided by the patient.       Past Medical History:  Diagnosis Date  . Anorectal fistula   . Arthritis   . Blood transfusion without reported diagnosis   . Cancer (HCC)    lymphoma hx  . Cataract   . Cellulitis 04/04/2013  . DVT (deep venous thrombosis) ([place])    2011 right leg  . History of lymphoma   . History of pulmonary embolus (PE)   . Incisional hernia   . Morbid obesity ([place])   . OSA on CPAP    cpap  10 yrs  . Varicose veins of lower extremities    pe    Patient Active Problem List   Diagnosis Date Noted  . Pain in right hip 03/27/2020  . Laceration of right ring finger 05/05/2019  . Left knee pain 06/14/2018  . Pain in right ankle and joints of right foot 12/18/2017  . Bilateral edema of lower extremity 10/30/2015  . Onychomycosis 10/30/2015  . Sepsis ([place]) 08/31/2015  . History of pulmonary embolism   . History of DVT (deep vein thrombosis)   . Pyrexia   . Cellulitis 03/18/2015  . Chronic anticoagulation 03/17/2015  . Morbid obesity with BMI of 45.0-49.9, adult ([place]) 03/17/2015  . S/P IVC filter 03/17/2015  . Genetic testing 01/12/2015  . Postoperative wound infection 12/09/2013  . Postop check 11/24/2013  . Ulcer of lower limb, unspecified 08/24/2013  .  Inguinal lymphadenopathy 08/16/2013  . Encounter for therapeutic drug monitoring 06/09/2013  . Cellulitis of left lower extremity 04/04/2013  . Osteoarthritis of left knee 11/02/2012  . Cellulitis of right lower extremity 04/30/2012  . 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 Status post splenectomy 04/29/2012  . Incisional hernia 05/27/2011  . Splenic marginal zone b-cell lymphoma ([place]) 04/16/2010  . Morbid obesity ([place]) 04/19/2008  . Obstructive sleep apnea 04/19/2008  . VARICOSE VEINS, LOWER EXTREMITIES 04/19/2008  . PULMONARY EMBOLISM, HX OF 04/19/2008    Past Surgical History:  Procedure Laterality Date  . EYE SURGERY Left 03   cat, detached ret    . FRACTURE SURGERY    . HERNIA REPAIR    . INGUINAL LYMPH NODE BIOPSY [name] 11/15/2013   Procedure: RIGHT INGUINAL LYMPH NODE BIOPSY;  Surgeon: Gwenyth Ober, MD;  Location: [facility];  Service: General;  Laterality: [name];  . ivc filter    . PROSTATE SURGERY     (patient denies)  . SPLENECTOMY    . TONSILLECTOMY     age 77  . TOTAL KNEE ARTHROPLASTY Left 11/02/2012   Procedure: TOTAL KNEE ARTHROPLASTY with revision tibia;  Surgeon: Garald Balding, MD;  Location: [facility];  Service: Orthopedics;  Laterality: Left;  Family History  Problem Relation Age of Onset  . Heart disease Father   . Varicose Veins Father   . Heart attack Father        dx. 66s  . Heart disease Mother 64  . Varicose Veins Mother   . Pancreatic cancer Sister 37  . Cancer Maternal Grandmother        either stomach or pancreatic primary   . Pancreatic cancer Maternal Grandfather        dx. 45s  . Lung cancer Maternal Aunt        secondhand smoke exposure  . Heart attack Paternal Uncle   . Rectal cancer Maternal Aunt        dx. 41s  . Cancer Maternal Aunt        unknown type  . Diabetes Paternal Uncle   . Cancer Cousin        unknown type  . Breast cancer Cousin        dx. 31s    Social History   Tobacco Use  . Smoking status: Former  Smoker    Packs/day: 2.00    Years: 20.00    Pack years: 40.00    Types: Cigarettes    Quit date: 05/05/1984    Years since quitting: 36.3  . Smokeless tobacco: Never Used  Vaping Use  . Vaping Use: Never used  Substance Use Topics  . Alcohol use: Yes    Alcohol/week: 0.0 standard drinks    Comment: maybe 1 beer/month  . Drug use: No    Home Medications Prior to Admission medications   Medication Sig Start Date End Date Taking? Authorizing Provider  acetaminophen (TYLENOL) 500 MG tablet Take 1,000 mg by mouth every 6 (six) hours as needed for mild pain.    [provider]  calcium carbonate (OS-CAL) 600 MG TABS Take 600 mg by mouth every morning.     [provider]  cholecalciferol (VITAMIN D) 1000 UNITS tablet Take 1,000 Units by mouth every morning.     [provider]  LORazepam (ATIVAN) 0.5 MG tablet TAKE 1 TO 2 TABLETS BY MOUTH AS NEEDED EVERY 12 HOURS 02/08/18   [provider]  Magnesium 400 MG CAPS Take 400 mg by mouth daily.     [provider]  Multiple Vitamin (THERAGRAN PO) Take 1 tablet by mouth daily.    [provider]  Nutritional Supplements (SALMON OIL) CAPS Take 1 capsule by mouth daily.    [provider]  rivaroxaban (XARELTO) 20 MG TABS tablet Take 1 tablet (20 mg total) by mouth daily. 06/04/16   Burnell Blanks, MD  VIAGRA 100 MG tablet TAKE 1 TABLET BY MOUTH EVERY DAY AS NEEDED FOR ERECTILE DYSFUNCTION . SPLIT TABLET IN HALF AS NEEDED 07/15/15   Harrison Mons, PA    Allergies    Hydrocodone, Hydrocodone-guaifenesin, Hydromorphone hcl, and Oxycodone  Review of Systems   Review of Systems  All other systems reviewed and are negative.   Physical Exam Updated Vital Signs BP (!) 163/92 (BP Location: Right Wrist)   Pulse 80   Temp 98.7 F (37.1 C) (Oral)   Resp 20   Ht '5\' 11"'  (1.803 m)   Wt (!) 152 kg   SpO2 95%   BMI 46.72 kg/m   Physical Exam Vitals and nursing note reviewed.   Constitutional:      General: He is not in acute distress.    Appearance: Normal appearance.  HENT:     Head:  Normocephalic.  Eyes:     Pupils: Pupils are equal, round, and reactive to light.  Cardiovascular:     Rate and Rhythm: Normal rate.     Pulses: Normal pulses.  Pulmonary:     Effort: Pulmonary effort is normal.  Musculoskeletal:        General: Tenderness and signs of injury present.       Hands:  Skin:    General: Skin is warm and dry.  Neurological:     Mental Status: He is alert and oriented to person, place, and time. Mental status is at baseline.  Psychiatric:        Mood and Affect: Mood normal.        Behavior: Behavior normal.      ED Results / Procedures / Treatments   Labs (all labs ordered are listed, but only abnormal results are displayed) Labs Reviewed - No data to display  EKG None  Radiology DG Finger Middle Left  Result Date: 09/17/2020 CLINICAL DATA:  Status post trauma. EXAM: LEFT MIDDLE FINGER 2+V COMPARISON:  None. FINDINGS: Surrounding, partially radiopaque gauze is noted with subsequently limited evaluation of the osseous and soft tissue fine detail. Acute, nondisplaced fracture deformity is seen involving the tuft of the distal phalanx of the third left finger. There is no evidence of dislocation. There is no evidence of arthropathy or other focal bone abnormality. An ill-defined soft tissue defect is seen involving the distal aspect of the third left finger. IMPRESSION: Acute fracture of the distal phalanx of the third left finger. Electronically Signed   By: Virgina Norfolk M.D.   On: 09/17/2020 15:44       Procedures Procedures   LACERATION REPAIR Performed by: [employer] Authorized by: Blanchie Dessert Consent: Verbal consent obtained. Risks and benefits: risks, benefits and alternatives were discussed Consent given by: patient Patient identity confirmed: provided demographic data Prepped and Draped in normal sterile  fashion Wound explored  Laceration Location: multiple lacs about 2cm  Laceration Length: 2cm x 3 separate lacs  No Foreign Bodies seen or palpated  Anesthesia: digital block infiltration  Local anesthetic: lidocaine 2% without epinephrine  Anesthetic total: 3 ml  Irrigation method: syringe Amount of cleaning: standard  Skin closure: 6.0 prolene  Number of sutures: 8  Technique: simple interrupted  Patient tolerance: Patient tolerated the procedure well with no immediate complications.  Medications Ordered in ED Medications  Tdap (BOOSTRIX) injection 0.5 mL (0.5 mLs Intramuscular Given 09/17/20 1425)    ED Course  I have reviewed the triage vital signs and the nursing notes.  Pertinent labs & imaging results that were available during my care of the patient were reviewed by me and considered in my medical decision making (see chart for details).    MDM Rules/Calculators/A&P                          Patient presenting with injury to his finger after being caught in the hedge tremors.  He has 3 deep lacerations and partial nail avulsion.  Tendons appear to be intact.  Sensation is intact.  Tetanus shot was updated.  X-ray shows acute fracture of the distal phalanx of the third left finger.  Will discuss with hand surgery.  5:51 PM Spoke with Dr. Lenon Curt and wound closed loosely.  Shredded pieces of nail were removed.  Patient takes Keflex daily so should be covered antibiotic wise.  Dressing was placed and he will follow-up with Dr.  Coley for further evaluation.  MDM Number of Diagnoses or Management Options   Amount and/or Complexity of Data Reviewed Tests in the radiology section of CPT: ordered and reviewed Discuss the patient with other providers: yes Independent visualization of images, tracings, or specimens: yes   Final Clinical Impression(s) / ED Diagnoses Final diagnoses:  Nailbed laceration, finger, initial encounter  Displaced fracture of distal phalanx  of left middle finger, initial encounter for open fracture    Rx / DC Orders ED Discharge Orders    None       Blanchie Dessert, MD 09/17/20 1755

## 2020-09-18 ENCOUNTER — Encounter: Payer: Self-pay | Admitting: Orthopaedic Surgery

## 2020-09-18 ENCOUNTER — Ambulatory Visit (INDEPENDENT_AMBULATORY_CARE_PROVIDER_SITE_OTHER): Payer: Medicare Other | Admitting: Orthopaedic Surgery

## 2020-09-18 DIAGNOSIS — S62663B Nondisplaced fracture of distal phalanx of left middle finger, initial encounter for open fracture: Secondary | ICD-10-CM

## 2020-09-18 DIAGNOSIS — M25571 Pain in right ankle and joints of right foot: Secondary | ICD-10-CM

## 2020-09-18 MED ORDER — LIDOCAINE HCL 1 % IJ SOLN
1.0000 mL | INTRAMUSCULAR | Status: AC | PRN
  Administered 2020-09-18: 1 mL

## 2020-09-18 MED ORDER — BUPIVACAINE HCL 0.5 % IJ SOLN
1.0000 mL | INTRAMUSCULAR | Status: AC | PRN
  Administered 2020-09-18: 1 mL via INTRA_ARTICULAR

## 2020-09-18 NOTE — Progress Notes (Signed)
Office Visit Note   Patient: Marc Mills           Date of Birth: 05/05/1946           MRN: [id number] Visit Date: 09/18/2020              Requested by: Josetta Huddle, MD [address] [place],  [place] [address] PCP: Josetta Huddle, MD   Assessment & Plan: Visit Diagnoses:  1. Pain in right ankle and joints of right foot   2. Nondisplaced fracture of distal phalanx of left middle finger, initial encounter for open fracture     Plan: Marc Mills sustained an open fracture of the tuft of the distal phalanx left long finger yesterday using a lawn machine.  [facility]  The wound was cleaned and sutured and then covered.  He has been on Keflex 500 mg twice a day.  I remove the dressing.  Wound looks fine skin looks relatively intact.  The nail is missing.  I reapplied Xeroform gauze and a sterile bulky dressing.  I like him to see Dr. Lorin Mercy in my absence next week to consider removing the stitches Long discussion regarding activity modification.  He will be able to swim for weeks.  In addition he has osteoarthritis of his right ankle.  I reinjected the ankle joint with betamethasone Xylocaine and Marcaine today  Follow-Up Instructions: Return in about 1 week (around 09/25/2020), or To see Dr. Lorin Mercy for reevaluation of fingertip and removal of stitches.   Orders:  No orders of the defined types were placed in this encounter.  No orders of the defined types were placed in this encounter.     Procedures: Medium Joint Inj on 09/18/2020 2:09 PM Details: 75 G needle, anteromedial approach Medications: 1 mL lidocaine 1 %; 1 mL bupivacaine 0.5 %  6 mg betamethasone injected with the above along the medial ankle joint      Clinical Data: No additional findings.   Subjective: Chief Complaint  Patient presents with  . Left Hand - Injury    DOI 09/17/2020  Injured left long finger using a lawn machine yesterday.  [facility] where the wound was cleaned and covered.  He has been on Keflex 500 mg twice a day.  No fever or chills.  I reviewed the films of his finger on the PACS system from the emergency room.  There is a tuft fracture but no displacement  Also has a history of osteoarthritis of his right ankle and wanted a cortisone injection today  HPI  Review of Systems   Objective: Vital Signs: There were no vitals taken for this visit.  Physical Exam Constitutional:      Appearance: He is well-developed.  Eyes:     Pupils: Pupils are equal, round, and reactive to light.  Pulmonary:     Effort: Pulmonary effort is normal.  Skin:    General: Skin is warm and dry.  Neurological:     Mental Status: He is alert and oriented to person, place, and time.  Psychiatric:        Behavior: Behavior normal.     Ortho Exam dressing removed from left long finger.  The wound is clean.  There is some maceration of the skin.  Skin looks like to be intact.  Nail is missing.  Minimal swelling and no drainage.  Reapplied a sterile dressing.  It seems to  have good sensation of the tip of his finger.  There is no deformity. Tenderness mostly along the medial aspect of right ankle at the medial malleolar talar junction.  Will inject this area Specialty Comments:  No specialty comments available.  Imaging: DG Finger Middle Left  Result Date: 09/17/2020 CLINICAL DATA:  Status post trauma. EXAM: LEFT MIDDLE FINGER 2+V COMPARISON:  None. FINDINGS: Surrounding, partially radiopaque gauze is noted with subsequently limited evaluation of the osseous and soft tissue fine detail. Acute, nondisplaced fracture deformity is seen involving the tuft of the distal phalanx of the third left finger. There is no evidence of dislocation. There is no evidence of arthropathy or other focal bone abnormality. An ill-defined soft tissue defect is seen involving the distal aspect of the third left finger. IMPRESSION: Acute  fracture of the distal phalanx of the third left finger. Electronically Signed   By: Virgina Norfolk M.D.   On: 09/17/2020 15:44     PMFS History: Patient Active Problem List   Diagnosis Date Noted  . Nondisplaced fracture of distal phalanx of left middle finger, initial encounter for open fracture 09/18/2020  . Pain in right hip 03/27/2020  . Laceration of right ring finger 05/05/2019  . Left knee pain 06/14/2018  . Pain in right ankle and joints of right foot 12/18/2017  . Bilateral edema of lower extremity 10/30/2015  . Onychomycosis 10/30/2015  . Sepsis ([place]) 08/31/2015  . History of pulmonary embolism   . History of DVT (deep vein thrombosis)   . Pyrexia   . Cellulitis 03/18/2015  . Chronic anticoagulation 03/17/2015  . Morbid obesity with BMI of 45.0-49.9, adult ([place]) 03/17/2015  . S/P IVC filter 03/17/2015  . Genetic testing 01/12/2015  . Postoperative wound infection 12/09/2013  . Postop check 11/24/2013  . Ulcer of lower limb, unspecified 08/24/2013  . Inguinal lymphadenopathy 08/16/2013  . Encounter for therapeutic drug monitoring 06/09/2013  . Cellulitis of left lower extremity 04/04/2013  . Osteoarthritis of left knee 11/02/2012  . Cellulitis of right lower extremity 04/30/2012  . 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 Status post splenectomy 04/29/2012  . Incisional hernia 05/27/2011  . Splenic marginal zone b-cell lymphoma ([place]) 04/16/2010  . Morbid obesity ([place]) 04/19/2008  . Obstructive sleep apnea 04/19/2008  . VARICOSE VEINS, LOWER EXTREMITIES 04/19/2008  . PULMONARY EMBOLISM, HX OF 04/19/2008   Past Medical History:  Diagnosis Date  . Anorectal fistula   . Arthritis   . Blood transfusion without reported diagnosis   . Cancer (HCC)    lymphoma hx  . Cataract   . Cellulitis 04/04/2013  . DVT (deep venous thrombosis) ([place])    2011 right leg  . History of lymphoma   . History of pulmonary embolus ([place])   . Incisional hernia   . Morbid  obesity ([place])   . OSA on CPAP    cpap  10 yrs  . Varicose veins of lower extremities    pe    Family History  Problem Relation Age of Onset  . Heart disease Father   . Varicose Veins Father   . Heart attack Father        dx. 70s  . Heart disease Mother 15  . Varicose Veins Mother   . Pancreatic cancer Sister 51  . Cancer Maternal Grandmother        either stomach or pancreatic primary   . Pancreatic cancer Maternal Grandfather        dx. 70s  . Lung cancer  Maternal Aunt        secondhand smoke exposure  . Heart attack Paternal Uncle   . Rectal cancer Maternal Aunt        dx. 31s  . Cancer Maternal Aunt        unknown type  . Diabetes Paternal Uncle   . Cancer Cousin        unknown type  . Breast cancer Cousin        dx. 61s    Past Surgical History:  Procedure Laterality Date  . EYE SURGERY Left 03   cat, detached ret    . FRACTURE SURGERY    . HERNIA REPAIR    . INGUINAL LYMPH NODE BIOPSY [name] 11/15/2013   Procedure: RIGHT INGUINAL LYMPH NODE BIOPSY;  Surgeon: Gwenyth Ober, MD;  Location: [facility];  Service: General;  Laterality: [name];  . ivc filter    . PROSTATE SURGERY     (patient denies)  . SPLENECTOMY    . TONSILLECTOMY     age 43  . TOTAL KNEE ARTHROPLASTY Left 11/02/2012   Procedure: TOTAL KNEE ARTHROPLASTY with revision tibia;  Surgeon: Garald Balding, MD;  Location: [facility];  Service: Orthopedics;  Laterality: Left;   Social History   Occupational History  . Not on file  Tobacco Use  . Smoking status: Former Smoker    Packs/day: 2.00    Years: 20.00    Pack years: 40.00    Types: Cigarettes    Quit date: 05/05/1984    Years since quitting: 36.3  . Smokeless tobacco: Never Used  Vaping Use  . Vaping Use: Never used  Substance and Sexual Activity  . Alcohol use: Yes    Alcohol/week: 0.0 standard drinks    Comment: maybe 1 beer/month  . Drug use: No  . Sexual activity: Yes    Birth control/protection: None     Garald Balding, MD   Note -  This record has been created using [employer].  Chart creation errors have been sought, but may not always  have been located. Such creation errors do not reflect on  the standard of medical care.

## 2020-09-20 ENCOUNTER — Encounter: Payer: Self-pay | Admitting: Orthopaedic Surgery

## 2020-09-20 ENCOUNTER — Other Ambulatory Visit: Payer: Self-pay

## 2020-09-20 ENCOUNTER — Ambulatory Visit (INDEPENDENT_AMBULATORY_CARE_PROVIDER_SITE_OTHER): Payer: Medicare Other | Admitting: Orthopaedic Surgery

## 2020-09-20 DIAGNOSIS — S62663B Nondisplaced fracture of distal phalanx of left middle finger, initial encounter for open fracture: Secondary | ICD-10-CM

## 2020-09-20 NOTE — Progress Notes (Signed)
Office Visit Note   Patient: Marc Mills           Date of Birth: 28-Nov-1945           MRN: [id number] Visit Date: 09/20/2020              Requested by: Josetta Huddle, MD [address] [place] [address] [place],  [place] [address] PCP: Josetta Huddle, MD   Assessment & Plan: Visit Diagnoses:  1. Nondisplaced fracture of distal phalanx of left middle finger, initial encounter for open fracture     Plan: [name] was seen 2 days ago for evaluation of the open fracture of the distal phalanx left long finger.  He had some drainage to the dressing and I changed it today.  Wounds look fine.  He is taking Keflex 500 mg twice a day.  There is no evidence of any infection.  We will plan to be seen next week for reevaluation  Follow-Up Instructions: Return in about 1 week (around 09/27/2020).   Orders:  No orders of the defined types were placed in this encounter.  No orders of the defined types were placed in this encounter.     Procedures: No procedures performed   Clinical Data: No additional findings.   Subjective: Chief Complaint  Patient presents with  . Left Hand - Follow-up  Denies any fever or chills.  Has had some drainage through the dressing and wanted to have it changed  HPI  Review of Systems   Objective: Vital Signs: There were no vitals taken for this visit.  Physical Exam  Ortho Exam dressing was removed from the left long finger.  Maceration has resolved.  The skin edges appear to be intact.  There is no drainage.  No deformity  Specialty Comments:  No specialty comments available.  Imaging: No results found.   PMFS History: Patient Active Problem List   Diagnosis Date Noted  . Nondisplaced fracture of distal phalanx of left middle finger, initial encounter for open fracture 09/18/2020  . Pain in right hip 03/27/2020  . Laceration of right ring finger 05/05/2019  . Left knee pain 06/14/2018  . Pain in right ankle and joints of right foot  12/18/2017  . Bilateral edema of lower extremity 10/30/2015  . Onychomycosis 10/30/2015  . Sepsis ([facility]) 08/31/2015  . History of pulmonary embolism   . History of DVT (deep vein thrombosis)   . Pyrexia   . Cellulitis 03/18/2015  . Chronic anticoagulation 03/17/2015  . Morbid obesity with BMI of 45.0-49.9, adult ([facility]) 03/17/2015  . S/P IVC filter 03/17/2015  . Genetic testing 01/12/2015  . Postoperative wound infection 12/09/2013  . Postop check 11/24/2013  . Ulcer of lower limb, unspecified 08/24/2013  . Inguinal lymphadenopathy 08/16/2013  . Encounter for therapeutic drug monitoring 06/09/2013  . Cellulitis of left lower extremity 04/04/2013  . Osteoarthritis of left knee 11/02/2012  . Cellulitis of right lower extremity 04/30/2012  . 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 Status post splenectomy 04/29/2012  . Incisional hernia 05/27/2011  . Splenic marginal zone b-cell lymphoma ([facility]) 04/16/2010  . Morbid obesity ([facility]) 04/19/2008  . Obstructive sleep apnea 04/19/2008  . VARICOSE VEINS, LOWER EXTREMITIES 04/19/2008  . PULMONARY EMBOLISM, HX OF 04/19/2008   Past Medical History:  Diagnosis Date  . Anorectal fistula   . Arthritis   . Blood transfusion without reported diagnosis   . Cancer (HCC)    lymphoma hx  . Cataract   . Cellulitis 04/04/2013  .  DVT (deep venous thrombosis) ([facility])    2011 right leg  . History of lymphoma   . History of pulmonary embolus (PE)   . Incisional hernia   . Morbid obesity ([place])   . OSA on CPAP    cpap  10 yrs  . Varicose veins of lower extremities    pe    Family History  Problem Relation Age of Onset  . Heart disease Father   . Varicose Veins Father   . Heart attack Father        dx. 44s  . Heart disease Mother 16  . Varicose Veins Mother   . Pancreatic cancer Sister 40  . Cancer Maternal Grandmother        either stomach or pancreatic primary   . Pancreatic cancer Maternal Grandfather        dx. 75s  . Lung cancer  Maternal Aunt        secondhand smoke exposure  . Heart attack Paternal Uncle   . Rectal cancer Maternal Aunt        dx. 30s  . Cancer Maternal Aunt        unknown type  . Diabetes Paternal Uncle   . Cancer Cousin        unknown type  . Breast cancer Cousin        dx. 17s    Past Surgical History:  Procedure Laterality Date  . EYE SURGERY Left 03   cat, detached ret    . FRACTURE SURGERY    . HERNIA REPAIR    . INGUINAL LYMPH NODE BIOPSY [name] 11/15/2013   Procedure: RIGHT INGUINAL LYMPH NODE BIOPSY;  Surgeon: Gwenyth Ober, MD;  Location: [facility];  Service: General;  Laterality: [name];  . ivc filter    . PROSTATE SURGERY     (patient denies)  . SPLENECTOMY    . TONSILLECTOMY     age 7  . TOTAL KNEE ARTHROPLASTY Left 11/02/2012   Procedure: TOTAL KNEE ARTHROPLASTY with revision tibia;  Surgeon: Garald Balding, MD;  Location: [facility];  Service: Orthopedics;  Laterality: Left;   Social History   Occupational History  . Not on file  Tobacco Use  . Smoking status: Former Smoker    Packs/day: 2.00    Years: 20.00    Pack years: 40.00    Types: Cigarettes    Quit date: 05/05/1984    Years since quitting: 36.4  . Smokeless tobacco: Never Used  Vaping Use  . Vaping Use: Never used  Substance and Sexual Activity  . Alcohol use: Yes    Alcohol/week: 0.0 standard drinks    Comment: maybe 1 beer/month  . Drug use: No  . Sexual activity: Yes    Birth control/protection: None     Garald Balding, MD   Note - This record has been created using [employer].  Chart creation errors have been sought, but may not always  have been located. Such creation errors do not reflect on  the standard of medical care.

## 2020-09-24 ENCOUNTER — Ambulatory Visit: Payer: Medicare Other | Admitting: Podiatry

## 2020-09-25 ENCOUNTER — Other Ambulatory Visit: Payer: Self-pay

## 2020-09-25 ENCOUNTER — Encounter: Payer: Self-pay | Admitting: Orthopaedic Surgery

## 2020-09-25 ENCOUNTER — Ambulatory Visit (INDEPENDENT_AMBULATORY_CARE_PROVIDER_SITE_OTHER): Payer: Medicare Other | Admitting: Orthopaedic Surgery

## 2020-09-25 VITALS — BP 142/88 | HR 59 | Ht 71.0 in | Wt 335.0 lb

## 2020-09-25 DIAGNOSIS — S62663B Nondisplaced fracture of distal phalanx of left middle finger, initial encounter for open fracture: Secondary | ICD-10-CM | POA: Diagnosis not present

## 2020-09-25 NOTE — Progress Notes (Signed)
Office Visit Note   Patient: Marc Mills           Date of Birth: 1945/06/29           MRN: [id number] Visit Date: 09/25/2020              Requested by: Josetta Huddle, MD [address] [place] [address] [place],  [place] [address] PCP: Josetta Huddle, MD   Assessment & Plan: Visit Diagnoses:  1. Nondisplaced fracture of distal phalanx of left middle finger, initial encounter for open fracture     Plan: 2 Band-Aids applied for dressing.  He can remove them since he wants to get back to swimming in a chlorinated pool.  After something he can reapply the Band-Aid and can return in 1 week for recheck and possible suture removal with me.  Following that visit he can see Dr. Durward Fortes again.  Follow-Up Instructions: No follow-ups on file.   Orders:  No orders of the defined types were placed in this encounter.  No orders of the defined types were placed in this encounter.     Procedures: No procedures performed   Clinical Data: No additional findings.   Subjective: Chief Complaint  Patient presents with  . Left Middle Finger - Follow-up, Fracture, Wound Check    DOI 09/17/2020    HPI follow-up left middle finger fingertip injury with fracture and sutures.  Fingertip has been covered.  He denies any pain he still taking his antibiotics.  There is mild swelling at the PIP no cellulitis.  Good flexion extension of the PIP and DIP joint.  Original injury was with a hedge tremor.  Review of Systems updated noncontributory.   Objective: Vital Signs: BP (!) 142/88   Pulse (!) 59   Ht '5\' 11"'  (1.803 m)   Wt (!) 335 lb (152 kg)   BMI 46.72 kg/m   Physical Exam Constitutional:      Appearance: He is well-developed.  HENT:     Head: Normocephalic and atraumatic.  Eyes:     Pupils: Pupils are equal, round, and reactive to light.  Neck:     Thyroid: No thyromegaly.     Trachea: No tracheal deviation.  Cardiovascular:     Rate and Rhythm: Normal rate.  Pulmonary:      Effort: Pulmonary effort is normal.     Breath sounds: No wheezing.  Abdominal:     General: Bowel sounds are normal.     Palpations: Abdomen is soft.  Skin:    General: Skin is warm and dry.     Capillary Refill: Capillary refill takes less than 2 seconds.  Neurological:     Mental Status: He is alert and oriented to person, place, and time.  Psychiatric:        Behavior: Behavior normal.        Thought Content: Thought content normal.        Judgment: Judgment normal.     Ortho Exam no cellulitis.  Prolene sutures are intact.  Mild swelling of the tip of the finger.  Specialty Comments:  No specialty comments available.  Imaging: No results found.   PMFS History: Patient Active Problem List   Diagnosis Date Noted  . Nondisplaced fracture of distal phalanx of left middle finger, initial encounter for open fracture 09/18/2020  . Pain in right hip 03/27/2020  . Laceration of right ring finger 05/05/2019  . Left knee pain 06/14/2018  . Pain in right ankle and joints of right  foot 12/18/2017  . Bilateral edema of lower extremity 10/30/2015  . Onychomycosis 10/30/2015  . Sepsis ([facility]) 08/31/2015  . History of pulmonary embolism   . History of DVT (deep vein thrombosis)   . Pyrexia   . Cellulitis 03/18/2015  . Chronic anticoagulation 03/17/2015  . Morbid obesity with BMI of 45.0-49.9, adult ([facility]) 03/17/2015  . S/P IVC filter 03/17/2015  . Genetic testing 01/12/2015  . Postoperative wound infection 12/09/2013  . Postop check 11/24/2013  . Ulcer of lower limb, unspecified 08/24/2013  . Inguinal lymphadenopathy 08/16/2013  . Encounter for therapeutic drug monitoring 06/09/2013  . Cellulitis of left lower extremity 04/04/2013  . Osteoarthritis of left knee 11/02/2012  . Cellulitis of right lower extremity 04/30/2012  . 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 Status post splenectomy 04/29/2012  . Incisional hernia 05/27/2011  . Splenic marginal zone b-cell  lymphoma ([facility]) 04/16/2010  . Morbid obesity ([facility]) 04/19/2008  . Obstructive sleep apnea 04/19/2008  . VARICOSE VEINS, LOWER EXTREMITIES 04/19/2008  . PULMONARY EMBOLISM, HX OF 04/19/2008   Past Medical History:  Diagnosis Date  . Anorectal fistula   . Arthritis   . Blood transfusion without reported diagnosis   . Cancer (HCC)    lymphoma hx  . Cataract   . Cellulitis 04/04/2013  . DVT (deep venous thrombosis) ([facility])    2011 right leg  . History of lymphoma   . History of pulmonary embolus (PE)   . Incisional hernia   . Morbid obesity ([facility])   . OSA on CPAP    cpap  10 yrs  . Varicose veins of lower extremities    pe    Family History  Problem Relation Age of Onset  . Heart disease Father   . Varicose Veins Father   . Heart attack Father        dx. 64s  . Heart disease Mother 60  . Varicose Veins Mother   . Pancreatic cancer Sister 39  . Cancer Maternal Grandmother        either stomach or pancreatic primary   . Pancreatic cancer Maternal Grandfather        dx. 74s  . Lung cancer Maternal Aunt        secondhand smoke exposure  . Heart attack Paternal Uncle   . Rectal cancer Maternal Aunt        dx. 35s  . Cancer Maternal Aunt        unknown type  . Diabetes Paternal Uncle   . Cancer Cousin        unknown type  . Breast cancer Cousin        dx. 27s    Past Surgical History:  Procedure Laterality Date  . EYE SURGERY Left 03   cat, detached ret    . FRACTURE SURGERY    . HERNIA REPAIR    . INGUINAL LYMPH NODE BIOPSY [name] 11/15/2013   Procedure: RIGHT INGUINAL LYMPH NODE BIOPSY;  Surgeon: Gwenyth Ober, MD;  Location: [facility];  Service: General;  Laterality: [name];  . ivc filter    . PROSTATE SURGERY     (patient denies)  . SPLENECTOMY    . TONSILLECTOMY     age 24  . TOTAL KNEE ARTHROPLASTY Left 11/02/2012   Procedure: TOTAL KNEE ARTHROPLASTY with revision tibia;  Surgeon: Garald Balding, MD;  Location: [facility];  Service: Orthopedics;  Laterality: Left;   Social  History   Occupational History  . Not on file  Tobacco Use  . Smoking status: Former Smoker    Packs/day: 2.00    Years: 20.00    Pack years: 40.00    Types: Cigarettes    Quit date: 05/05/1984    Years since quitting: 36.4  . Smokeless tobacco: Never Used  Vaping Use  . Vaping Use: Never used  Substance and Sexual Activity  . Alcohol use: Yes    Alcohol/week: 0.0 standard drinks    Comment: maybe 1 beer/month  . Drug use: No  . Sexual activity: Yes    Birth control/protection: None

## 2020-10-02 ENCOUNTER — Encounter: Payer: Self-pay | Admitting: Orthopaedic Surgery

## 2020-10-02 ENCOUNTER — Ambulatory Visit (INDEPENDENT_AMBULATORY_CARE_PROVIDER_SITE_OTHER): Payer: Medicare Other | Admitting: Orthopaedic Surgery

## 2020-10-02 ENCOUNTER — Other Ambulatory Visit: Payer: Self-pay

## 2020-10-02 VITALS — BP 135/84 | HR 72 | Ht 71.0 in | Wt 335.0 lb

## 2020-10-02 DIAGNOSIS — S62663B Nondisplaced fracture of distal phalanx of left middle finger, initial encounter for open fracture: Secondary | ICD-10-CM | POA: Diagnosis not present

## 2020-10-02 NOTE — Progress Notes (Signed)
Office Visit Note   Patient: Marc Mills           Date of Birth: 02/06/1946           MRN: [id number] Visit Date: 10/02/2020              Requested by: Josetta Huddle, MD [address] [place] [address] [place],  [place] [address] PCP: Josetta Huddle, MD   Assessment & Plan: Visit Diagnoses:  1. Nondisplaced fracture of distal phalanx of left middle finger, initial encounter for open fracture     Plan: Sutures removed from the fingertip Band-Aid applied.  He can follow-up as needed for his knee problems with Dr. Durward Fortes.  Follow-Up Instructions: Return if symptoms worsen or fail to improve.   Orders:  No orders of the defined types were placed in this encounter.  No orders of the defined types were placed in this encounter.     Procedures: No procedures performed   Clinical Data: No additional findings.   Subjective: Chief Complaint  Patient presents with  . Left Middle Finger - Wound Check    DOI 09/17/2020    HPI 75 year old male returns after injury to left middle finger close emergency room at [facility] ER in follow-up by Dr. Durward Fortes.  Today he returns in Dr. Rudene Anda absence and tip the finger looks good and sutures are removed.  Review of Systems updated unchanged.   Objective: Vital Signs: BP 135/84 (BP Location: Right Arm)   Pulse 72   Ht '5\' 11"'  (1.803 m)   Wt (!) 335 lb (152 kg)   BMI 46.72 kg/m   Physical Exam no cellulitis, good range of motion of the finger.  No drainage.  Blue Prolene sutures are intact.  Ortho Exam as above.  Specialty Comments:  No specialty comments available.  Imaging: No results found.   PMFS History: Patient Active Problem List   Diagnosis Date Noted  . Nondisplaced fracture of distal phalanx of left middle finger, initial encounter for open fracture 09/18/2020  . Pain in right hip 03/27/2020  . Laceration of right ring finger 05/05/2019  . Left knee pain 06/14/2018  . Pain in right ankle and joints  of right foot 12/18/2017  . Bilateral edema of lower extremity 10/30/2015  . Onychomycosis 10/30/2015  . Sepsis ([facility]) 08/31/2015  . History of pulmonary embolism   . History of DVT (deep vein thrombosis)   . Pyrexia   . Cellulitis 03/18/2015  . Chronic anticoagulation 03/17/2015  . Morbid obesity with BMI of 45.0-49.9, adult ([facility]) 03/17/2015  . S/P IVC filter 03/17/2015  . Genetic testing 01/12/2015  . Postoperative wound infection 12/09/2013  . Postop check 11/24/2013  . Ulcer of lower limb, unspecified 08/24/2013  . Inguinal lymphadenopathy 08/16/2013  . Encounter for therapeutic drug monitoring 06/09/2013  . Cellulitis of left lower extremity 04/04/2013  . Osteoarthritis of left knee 11/02/2012  . Cellulitis of right lower extremity 04/30/2012  . 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 Status post splenectomy 04/29/2012  . Incisional hernia 05/27/2011  . Splenic marginal zone b-cell lymphoma ([facility]) 04/16/2010  . Morbid obesity ([facility]) 04/19/2008  . Obstructive sleep apnea 04/19/2008  . VARICOSE VEINS, LOWER EXTREMITIES 04/19/2008  . PULMONARY EMBOLISM, HX OF 04/19/2008   Past Medical History:  Diagnosis Date  . Anorectal fistula   . Arthritis   . Blood transfusion without reported diagnosis   . Cancer (HCC)    lymphoma hx  . Cataract   . Cellulitis 04/04/2013  .  DVT (deep venous thrombosis) ([facility])    2011 right leg  . History of lymphoma   . History of pulmonary embolus (PE)   . Incisional hernia   . Morbid obesity ([facility])   . OSA on CPAP    cpap  10 yrs  . Varicose veins of lower extremities    pe    Family History  Problem Relation Age of Onset  . Heart disease Father   . Varicose Veins Father   . Heart attack Father        dx. 60s  . Heart disease Mother 53  . Varicose Veins Mother   . Pancreatic cancer Sister 38  . Cancer Maternal Grandmother        either stomach or pancreatic primary   . Pancreatic cancer Maternal Grandfather        dx. 66s  .  Lung cancer Maternal Aunt        secondhand smoke exposure  . Heart attack Paternal Uncle   . Rectal cancer Maternal Aunt        dx. 57s  . Cancer Maternal Aunt        unknown type  . Diabetes Paternal Uncle   . Cancer Cousin        unknown type  . Breast cancer Cousin        dx. 69s    Past Surgical History:  Procedure Laterality Date  . EYE SURGERY Left 03   cat, detached ret    . FRACTURE SURGERY    . HERNIA REPAIR    . INGUINAL LYMPH NODE BIOPSY [name] 11/15/2013   Procedure: RIGHT INGUINAL LYMPH NODE BIOPSY;  Surgeon: Gwenyth Ober, MD;  Location: [facility];  Service: General;  Laterality: [name];  . ivc filter    . PROSTATE SURGERY     (patient denies)  . SPLENECTOMY    . TONSILLECTOMY     age 42  . TOTAL KNEE ARTHROPLASTY Left 11/02/2012   Procedure: TOTAL KNEE ARTHROPLASTY with revision tibia;  Surgeon: Garald Balding, MD;  Location: [facility];  Service: Orthopedics;  Laterality: Left;   Social History   Occupational History  . Not on file  Tobacco Use  . Smoking status: Former Smoker    Packs/day: 2.00    Years: 20.00    Pack years: 40.00    Types: Cigarettes    Quit date: 05/05/1984    Years since quitting: 36.4  . Smokeless tobacco: Never Used  Vaping Use  . Vaping Use: Never used  Substance and Sexual Activity  . Alcohol use: Yes    Alcohol/week: 0.0 standard drinks    Comment: maybe 1 beer/month  . Drug use: No  . Sexual activity: Yes    Birth control/protection: None

## 2020-11-12 ENCOUNTER — Other Ambulatory Visit: Payer: Self-pay

## 2020-11-12 ENCOUNTER — Encounter: Payer: Self-pay | Admitting: Podiatry

## 2020-11-12 ENCOUNTER — Ambulatory Visit (INDEPENDENT_AMBULATORY_CARE_PROVIDER_SITE_OTHER): Payer: Medicare Other | Admitting: Podiatry

## 2020-11-12 DIAGNOSIS — B351 Tinea unguium: Secondary | ICD-10-CM

## 2020-11-12 DIAGNOSIS — M79675 Pain in left toe(s): Secondary | ICD-10-CM

## 2020-11-12 DIAGNOSIS — S90411A Abrasion, right great toe, initial encounter: Secondary | ICD-10-CM | POA: Diagnosis not present

## 2020-11-12 DIAGNOSIS — L84 Corns and callosities: Secondary | ICD-10-CM | POA: Diagnosis not present

## 2020-11-12 DIAGNOSIS — D689 Coagulation defect, unspecified: Secondary | ICD-10-CM

## 2020-11-12 DIAGNOSIS — M79674 Pain in right toe(s): Secondary | ICD-10-CM

## 2020-11-12 NOTE — Progress Notes (Signed)
Subjective:  Patient ID: Marc Mills, male    DOB: April 03, 1946,  MRN: [id number]  75 y.o. male presents with at risk foot care with h/o clotting disorder and painful thick toenails that are difficult to trim. Pain interferes with ambulation. Aggravating factors include wearing enclosed shoe gear. Pain is relieved with periodic professional debridement..    I pointed out patient's right great toe is bleeding and he states he wore his sneakers without socks at the pool and it rubbed small areas on both great toes.  He takes Eliquis for DVT prevention.  He also takes Keflex 500 mg twice daily for prophylaxis of recurrent cellulitis b/l LE.  PCP: Josetta Huddle, MD and last visit was: 2 months ago per patient.  Review of Systems: Negative except as noted in the HPI.   Allergies  Allergen Reactions   Hydrocodone Itching and Other (See Comments)    Severe dizziness, Gives me chills   Hydrocodone-Guaifenesin Other (See Comments)   Hydromorphone Hcl Other (See Comments)   Oxycodone Itching and Other (See Comments)    Severe dizziness, Gives me chills    Objective:  There were no vitals filed for this visit. Constitutional Patient is a pleasant 75 y.o. Caucasian male morbidly obese in NAD. AAO x 3.  Vascular Capillary refill time to digits immediate b/l. Palpable pedal pulses b/l LE. Pedal hair sparse. Lower extremity skin temperature gradient within normal limits. No pain with calf compression b/l. Nonpitting edema noted b/l lower extremities. Varicosities present b/l. Evidence of chronic venous insufficiency b/l lower extremities. No cyanosis or clubbing noted.  Neurologic Normal speech. Protective sensation intact 5/5 intact bilaterally with 10g monofilament b/l. Vibratory sensation intact b/l.  Dermatologic No interdigital macerations b/l lower extremities Toenails 1-5 b/l elongated, discolored, dystrophic, thickened, crumbly with subungual debris and tenderness to dorsal palpation.  Abrasion with light bleeding dorsal aspect of right hallux. No edema, no erythema, no flucutance, no purulence. Dorsal IPJ. Left hallux with dried blood blister overlying IPJ. No erythema, no edema, no drainage, no fluctuance. Hyperkeratotic lesion(s) L hallux and R hallux.  No erythema, no edema, no drainage, no fluctuance. Hyperpigmentation consistent with findings of chronic venous insufficiency is present b/l lower extremities  Orthopedic: Normal muscle strength 5/5 to all lower extremity muscle groups bilaterally. No pain crepitus or joint limitation noted with ROM b/l. Hammertoe(s) noted to the L hallux and R hallux.    Assessment:   1. Pain due to onychomycosis of toenails of both feet   2. Callus   3. Abrasion of skin of right great toe   4. Clotting disorder [facility])    Plan:  Patient was evaluated and treated and all questions answered.  Onychomycosis with pain -Nails palliatively debridement as below. -Educated on self-care  Procedure: Nail Debridement Rationale: Pain Type of Debridement: manual, sharp debridement. Instrumentation: Nail nipper, rotary burr. Number of Nails: 10  -Examined patient. -Abrasion right great toe cleansed with alcohol. Triple antibiotic ointment and band-aid applied. Triple antibiotic ointment applied to left hallux and covered with band-aid. He is to apply Neosporin to both great toes once daily until healed. Call office if condition does not resolve. -Patient to continue soft, supportive shoe gear daily. -Toenails 1-5 b/l were debrided in length and girth with sterile nail nippers and dremel without iatrogenic bleeding.  -Callus(es) L hallux and R hallux pared utilizing sterile scalpel blade without complication or incident. Total number debrided =2. -Patient to report any pedal injuries to medical professional immediately. -Patient/POA to call should  there be question/concern in the interim.  Return in about 3 months (around 02/12/2021).  Marzetta Board, DPM

## 2020-11-23 DIAGNOSIS — U071 COVID-19: Secondary | ICD-10-CM | POA: Diagnosis not present

## 2020-11-24 DIAGNOSIS — H6503 Acute serous otitis media, bilateral: Secondary | ICD-10-CM | POA: Diagnosis not present

## 2020-12-09 DIAGNOSIS — S61431A Puncture wound without foreign body of right hand, initial encounter: Secondary | ICD-10-CM | POA: Diagnosis not present

## 2020-12-09 DIAGNOSIS — W540XXA Bitten by dog, initial encounter: Secondary | ICD-10-CM | POA: Diagnosis not present

## 2020-12-09 DIAGNOSIS — L03113 Cellulitis of right upper limb: Secondary | ICD-10-CM | POA: Diagnosis not present

## 2020-12-10 DIAGNOSIS — H6992 Unspecified Eustachian tube disorder, left ear: Secondary | ICD-10-CM | POA: Insufficient documentation

## 2020-12-10 DIAGNOSIS — H9202 Otalgia, left ear: Secondary | ICD-10-CM | POA: Diagnosis not present

## 2020-12-10 DIAGNOSIS — H6982 Other specified disorders of Eustachian tube, left ear: Secondary | ICD-10-CM | POA: Insufficient documentation

## 2020-12-10 DIAGNOSIS — H40013 Open angle with borderline findings, low risk, bilateral: Secondary | ICD-10-CM | POA: Diagnosis not present

## 2020-12-10 DIAGNOSIS — Z961 Presence of intraocular lens: Secondary | ICD-10-CM | POA: Diagnosis not present

## 2020-12-10 DIAGNOSIS — H5213 Myopia, bilateral: Secondary | ICD-10-CM | POA: Diagnosis not present

## 2021-01-02 DIAGNOSIS — Z20822 Contact with and (suspected) exposure to covid-19: Secondary | ICD-10-CM | POA: Diagnosis not present

## 2021-01-02 DIAGNOSIS — H6503 Acute serous otitis media, bilateral: Secondary | ICD-10-CM | POA: Diagnosis not present

## 2021-01-02 DIAGNOSIS — U071 COVID-19: Secondary | ICD-10-CM | POA: Diagnosis not present

## 2021-01-02 DIAGNOSIS — Z03818 Encounter for observation for suspected exposure to other biological agents ruled out: Secondary | ICD-10-CM | POA: Diagnosis not present

## 2021-01-02 DIAGNOSIS — J029 Acute pharyngitis, unspecified: Secondary | ICD-10-CM | POA: Diagnosis not present

## 2021-01-09 DIAGNOSIS — I878 Other specified disorders of veins: Secondary | ICD-10-CM | POA: Diagnosis not present

## 2021-01-09 DIAGNOSIS — L0390 Cellulitis, unspecified: Secondary | ICD-10-CM | POA: Diagnosis not present

## 2021-01-09 DIAGNOSIS — Z9081 Acquired absence of spleen: Secondary | ICD-10-CM | POA: Diagnosis not present

## 2021-01-09 DIAGNOSIS — R600 Localized edema: Secondary | ICD-10-CM | POA: Diagnosis not present

## 2021-01-19 DIAGNOSIS — Z20822 Contact with and (suspected) exposure to covid-19: Secondary | ICD-10-CM | POA: Diagnosis not present

## 2021-01-19 DIAGNOSIS — Z23 Encounter for immunization: Secondary | ICD-10-CM | POA: Diagnosis not present

## 2021-02-11 ENCOUNTER — Ambulatory Visit (INDEPENDENT_AMBULATORY_CARE_PROVIDER_SITE_OTHER): Payer: Medicare Other | Admitting: Podiatry

## 2021-02-11 ENCOUNTER — Other Ambulatory Visit: Payer: Self-pay

## 2021-02-11 ENCOUNTER — Encounter: Payer: Self-pay | Admitting: Podiatry

## 2021-02-11 DIAGNOSIS — M79674 Pain in right toe(s): Secondary | ICD-10-CM | POA: Diagnosis not present

## 2021-02-11 DIAGNOSIS — D689 Coagulation defect, unspecified: Secondary | ICD-10-CM

## 2021-02-11 DIAGNOSIS — R234 Changes in skin texture: Secondary | ICD-10-CM | POA: Diagnosis not present

## 2021-02-11 DIAGNOSIS — M79675 Pain in left toe(s): Secondary | ICD-10-CM | POA: Diagnosis not present

## 2021-02-11 DIAGNOSIS — B351 Tinea unguium: Secondary | ICD-10-CM | POA: Diagnosis not present

## 2021-02-11 DIAGNOSIS — L84 Corns and callosities: Secondary | ICD-10-CM

## 2021-02-12 ENCOUNTER — Encounter: Payer: Self-pay | Admitting: Orthopaedic Surgery

## 2021-02-12 ENCOUNTER — Ambulatory Visit (INDEPENDENT_AMBULATORY_CARE_PROVIDER_SITE_OTHER): Payer: Medicare Other | Admitting: Orthopaedic Surgery

## 2021-02-12 VITALS — Ht 71.0 in | Wt 335.0 lb

## 2021-02-12 DIAGNOSIS — M25571 Pain in right ankle and joints of right foot: Secondary | ICD-10-CM

## 2021-02-12 MED ORDER — METHYLPREDNISOLONE ACETATE 40 MG/ML IJ SUSP
40.0000 mg | INTRAMUSCULAR | Status: AC | PRN
  Administered 2021-02-12: 40 mg via INTRA_ARTICULAR

## 2021-02-12 MED ORDER — LIDOCAINE HCL 1 % IJ SOLN
2.0000 mL | INTRAMUSCULAR | Status: AC | PRN
  Administered 2021-02-12: 2 mL

## 2021-02-12 NOTE — Progress Notes (Signed)
Office Visit Note   Patient: Marc Mills           Date of Birth: [birth year]           MRN: [id number] Visit Date: 02/12/2021              Requested by: Marc Huddle, MD [address] [place],  [place] [address] PCP: Marc Huddle, MD   Assessment & Plan: Visit Diagnoses:  1. Pain in right ankle and joints of right foot     Plan: Marc Mills has experienced recurrent symptoms of osteoarthritis of the right ankle.  I will plan on inject the ankle with Depo-Medrol.  Has had similar problems in the past with good results with injections.  X-rays demonstrate osteoarthritis that is probably posttraumatic  Follow-Up Instructions: Return if symptoms worsen or fail to improve.   Orders:  No orders of the defined types were placed in this encounter.  No orders of the defined types were placed in this encounter.     Procedures: Medium Joint Inj on 02/12/2021 4:33 PM Details: 25 G needle, anterolateral approach Medications: 2 mL lidocaine 1 %; 40 mg methylPREDNISolone acetate 40 MG/ML     Clinical Data: No additional findings.   Subjective: Chief Complaint  Patient presents with   Right Ankle - Pain  Patient presents today for follow up on his recurrent right ankle pain. He was injected with cortisone in May of this year. He states that the injection helps for about 3 months. He is taking tylenol as needed. He would like to get another cortisone injection today.  HPI  Review of Systems   Objective: Vital Signs: Ht 5' 11" (1.803 m)   Wt (!) 335 lb (152 kg)   BMI 46.72 kg/m   Physical Exam Constitutional:      Appearance: He is well-developed.  Pulmonary:     Effort: Pulmonary effort is normal.  Skin:    General: Skin is warm and dry.  Neurological:     Mental Status: He is alert and oriented to person, place, and time.  Psychiatric:        Behavior: Behavior normal.    Ortho Exam right ankle with multiple small varicosities.  Some pain along  the anterior medial anterolateral ankle joint.  Some decreased dorsiflexion and plantarflexion.  Specialty Comments:  No specialty comments available.  Imaging: No results found.   PMFS History: Patient Active Problem List   Diagnosis Date Noted   Eustachian tube dysfunction, left 12/10/2020   Otalgia, left 12/10/2020   Nondisplaced fracture of distal phalanx of left middle finger, initial encounter for open fracture 09/18/2020   Pain in right hip 03/27/2020   Laceration of right ring finger 05/05/2019   Laceration of flexor muscle, fascia and tendon of right ring finger at wrist and hand level, initial encounter 04/23/2019   Unspecified fracture of the lower end of right radius, initial encounter for closed fracture 04/23/2019   Fracture of unspecified phalanx of right ring finger, initial encounter for open fracture 04/23/2019   Left knee pain 06/14/2018   Pain in right ankle and joints of right foot 12/18/2017   Bilateral edema of lower extremity 10/30/2015   Onychomycosis 10/30/2015   Sepsis ([place]) 08/31/2015   History of pulmonary embolism    History of DVT (deep vein thrombosis)    Pyrexia    Cellulitis 03/18/2015   Chronic anticoagulation 03/17/2015   Morbid obesity with BMI of 45.0-49.9, adult ([place]) 03/17/2015  S/P IVC filter 03/17/2015   Genetic testing 01/12/2015   Postoperative wound infection 12/09/2013   Postop check 11/24/2013   Ulcer of lower limb, unspecified 08/24/2013   Inguinal lymphadenopathy 08/16/2013   Encounter for therapeutic drug monitoring 06/09/2013   Cellulitis of left lower extremity 04/04/2013   Osteoarthritis of left knee 11/02/2012   Cellulitis of right lower extremity 04/30/2012   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 04/29/2012   Status post splenectomy 04/29/2012   Incisional hernia 05/27/2011   Splenic marginal zone b-cell lymphoma ([place]) 04/16/2010   Morbid obesity ([place]) 04/19/2008   Obstructive sleep apnea 04/19/2008   VARICOSE VEINS,  LOWER EXTREMITIES 04/19/2008   PULMONARY EMBOLISM, HX OF 04/19/2008   Past Medical History:  Diagnosis Date   Anorectal fistula    Arthritis    Blood transfusion without reported diagnosis    Cancer ([facility])    lymphoma hx   Cataract    Cellulitis 04/04/2013   DVT (deep venous thrombosis) ([place])    2011 right leg   History of lymphoma    History of pulmonary embolus ([facility])    Incisional hernia    Morbid obesity ([facility])    OSA on CPAP    cpap  10 yrs   Varicose veins of lower extremities    pe    Family History  Problem Relation Age of Onset   Heart disease Father    Varicose Veins Father    Heart attack Father        dx. 54s   Heart disease Mother 83   Varicose Veins Mother    Pancreatic cancer Sister 46   Cancer Maternal Grandmother        either stomach or pancreatic primary    Pancreatic cancer Maternal Grandfather        dx. 40s   Lung cancer Maternal Aunt        secondhand smoke exposure   Heart attack Paternal Uncle    Rectal cancer Maternal Aunt        dx. 4s   Cancer Maternal Aunt        unknown type   Diabetes Paternal Uncle    Cancer Cousin        unknown type   Breast cancer Cousin        dx. 64s    Past Surgical History:  Procedure Laterality Date   EYE SURGERY Left 03   cat, detached ret     FRACTURE SURGERY     HERNIA REPAIR     INGUINAL LYMPH NODE BIOPSY [name] 11/15/2013   Procedure: RIGHT INGUINAL LYMPH NODE BIOPSY;  Surgeon: Gwenyth Ober, MD;  Location: [facility];  Service: General;  Laterality: [name];   ivc filter     PROSTATE SURGERY     (patient denies)   SPLENECTOMY     TONSILLECTOMY     age 20   TOTAL KNEE ARTHROPLASTY Left 11/02/2012   Procedure: TOTAL KNEE ARTHROPLASTY with revision tibia;  Surgeon: Garald Balding, MD;  Location: [facility];  Service: Orthopedics;  Laterality: Left;   Social History   Occupational History   Not on file  Tobacco Use   Smoking status: Former    Packs/day: 2.00    Years: 20.00    Pack years: 40.00    Types:  Cigarettes    Quit date: 05/05/1984    Years since quitting: 36.8   Smokeless tobacco: Never  Vaping Use   Vaping Use: Never used  Substance and Sexual Activity  Alcohol use: Yes    Alcohol/week: 0.0 standard drinks    Comment: maybe 1 beer/month   Drug use: No   Sexual activity: Yes    Birth control/protection: None

## 2021-02-15 NOTE — Progress Notes (Signed)
  Subjective:  Patient ID: Marc Mills, male    DOB: [birth year],  MRN: [id number]  75 y.o. male presents at risk foot care with h/o clotting disorder and callus(es) b/l feet and painful thick toenails that are difficult to trim. Painful toenails interfere with ambulation. Aggravating factors include wearing enclosed shoe gear. Pain is relieved with periodic professional debridement. Painful calluses are aggravated when weightbearing with and without shoegear. Pain is relieved with periodic professional debridement.  Patient states he has cracked skin on the bottom of his left great toe.   PCP is Josetta Huddle, MD , and last visit was 09/06/2020.  Allergies  Allergen Reactions   Hydrocodone Itching and Other (See Comments)    Severe dizziness, Gives me chills   Hydrocodone-Guaifenesin Other (See Comments)   Hydromorphone Hcl Other (See Comments)   Oxycodone Itching and Other (See Comments)    Severe dizziness, Gives me chills    Review of Systems: Negative except as noted in the HPI.   Objective:  Vascular Examination: Capillary refill time to digits immediate b/l lower extremities. Palpable DP pulse(s) b/l lower extremities Palpable PT pulse(s) b/l lower extremities Pedal hair sparse. Lower extremity skin temperature gradient within normal limits. No pain with calf compression b/l. Nonpitting edema noted b/l lower extremities. Varicosities present b/l. Evidence of chronic venous insufficiency b/l lower extremities.  Neurological Examination: Protective sensation intact 5/5 intact bilaterally with 10g monofilament b/l.  Dermatological Examination: Skin warm and supple b/l lower extremities. Transverse skin crack noted plantar IPJ of the left hallux and healing abrasion noted dorsal aspect left 2nd toe. No erythema, no edema, no drainage, no fluctuance. No interdigital macerations b/l lower extremities. Toenails 1-5 b/l elongated, discolored, dystrophic, thickened, crumbly with  subungual debris and tenderness to dorsal palpation. Skin changes consistent with venous stasis noted b/l lower extremities.  Musculoskeletal Examination: Normal muscle strength 5/5 to all lower extremity muscle groups bilaterally. Hallux malleus b/l.  Radiographs: None Assessment:   1. Pain due to onychomycosis of toenails of both feet   2. Callus   3. Cracked skin on feet   4. Clotting disorder ([place])    Plan:  -Examined patient. -Patient instructed to moisturize feet daily. He is to apply Neosporin to skin crack on left hallux and abrasion left 2nd toe once daily until healed. -Patient to continue soft, supportive shoe gear daily. -Toenails 1-5 b/l were debrided in length and girth with sterile nail nippers and dremel without iatrogenic bleeding.  -Patient to report any pedal injuries to medical professional immediately. -Patient/POA to call should there be question/concern in the interim.  Return in about 3 months (around 05/14/2021).  Marzetta Board, DPM

## 2021-02-17 DIAGNOSIS — Z23 Encounter for immunization: Secondary | ICD-10-CM | POA: Diagnosis not present
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
## 2021-04-21 ENCOUNTER — Encounter: Payer: Self-pay | Admitting: Internal Medicine

## 2021-04-22 ENCOUNTER — Other Ambulatory Visit: Payer: Self-pay

## 2021-04-22 ENCOUNTER — Ambulatory Visit (INDEPENDENT_AMBULATORY_CARE_PROVIDER_SITE_OTHER): Payer: Medicare Other | Admitting: Internal Medicine

## 2021-04-22 ENCOUNTER — Encounter: Payer: Self-pay | Admitting: Internal Medicine

## 2021-04-22 VITALS — BP 124/74 | HR 64 | Temp 97.8°F | Ht 71.0 in | Wt 340.6 lb

## 2021-04-22 DIAGNOSIS — G4733 Obstructive sleep apnea (adult) (pediatric): Secondary | ICD-10-CM

## 2021-04-22 NOTE — Patient Instructions (Signed)
Order- DME [facility]- please replace old CPAP machine continue auto 4-10, mask of choice, humidifier, supplies, AirView/ card  Please call if we can help

## 2021-04-23 DIAGNOSIS — Z6841 Body Mass Index (BMI) 40.0 and over, adult: Secondary | ICD-10-CM | POA: Diagnosis not present

## 2021-05-08 ENCOUNTER — Ambulatory Visit (INDEPENDENT_AMBULATORY_CARE_PROVIDER_SITE_OTHER): Payer: Medicare Other | Admitting: Orthopaedic Surgery

## 2021-05-08 ENCOUNTER — Other Ambulatory Visit: Payer: Self-pay

## 2021-05-08 ENCOUNTER — Encounter: Payer: Self-pay | Admitting: Orthopaedic Surgery

## 2021-05-08 DIAGNOSIS — M25571 Pain in right ankle and joints of right foot: Secondary | ICD-10-CM

## 2021-05-08 MED ORDER — LIDOCAINE HCL 1 % IJ SOLN
1.0000 mL | INTRAMUSCULAR | Status: AC | PRN
  Administered 2021-05-08: 1 mL

## 2021-05-08 MED ORDER — METHYLPREDNISOLONE ACETATE 40 MG/ML IJ SUSP
40.0000 mg | INTRAMUSCULAR | Status: AC | PRN
  Administered 2021-05-08: 40 mg via INTRA_ARTICULAR

## 2021-05-08 NOTE — Progress Notes (Signed)
Office Visit Note   Patient: Marc Mills           Date of Birth: 1945/10/28           MRN: [id number] Visit Date: 05/08/2021              Requested by: Josetta Huddle, MD [address] [place],  [place] [address] PCP: Josetta Huddle, MD   Assessment & Plan: Visit Diagnoses:  1. Pain in right ankle and joints of right foot     Plan: Posttraumatic osteoarthritis right ankle.  I have seen Marc Mills on a frequent basis for cortisone injections performed several times a year with relief of his symptoms.  We will reinject today.  Follow-Up Instructions: Return if symptoms worsen or fail to improve.   Orders:  No orders of the defined types were placed in this encounter.  No orders of the defined types were placed in this encounter.     Procedures: Medium Joint Inj: R ankle on 05/08/2021 11:18 AM Indications: pain Details: 25 G 1.5 in needle, anterolateral approach Medications: 1 mL lidocaine 1 %; 40 mg methylPREDNISolone acetate 40 MG/ML     Clinical Data: No additional findings.   Subjective: Chief Complaint  Patient presents with   Right Ankle - Pain  Patient presents today for his recurrent chronic right ankle pain. He received a cortisone injection in October of 2022.  Has had prior injury to his ankle with x-rays demonstrating osteoarthritis Continues to swim every day or every other day and has lost some weight  HPI  Review of Systems   Objective: Vital Signs: There were no vitals taken for this visit.  Physical Exam Constitutional:      Appearance: He is well-developed.  Pulmonary:     Effort: Pulmonary effort is normal.  Skin:    General: Skin is warm and dry.  Neurological:     Mental Status: He is alert and oriented to person, place, and time.  Psychiatric:        Behavior: Behavior normal.    Ortho Exam large ankles.  Skin intact.  Some limitation of dorsiflexion plantarflexion of left ankle but without crepitation.  Mostly  anterior lateral discomfort.  Good capillary refill to toes  Specialty Comments:  No specialty comments available.  Imaging: No results found.   PMFS History: Patient Active Problem List   Diagnosis Date Noted   Eustachian tube dysfunction, left 12/10/2020   Otalgia, left 12/10/2020   Nondisplaced fracture of distal phalanx of left middle finger, initial encounter for open fracture 09/18/2020   Pain in right hip 03/27/2020   Laceration of right ring finger 05/05/2019   Laceration of flexor muscle, fascia and tendon of right ring finger at wrist and hand level, initial encounter 04/23/2019   Unspecified fracture of the lower end of right radius, initial encounter for closed fracture 04/23/2019   Fracture of unspecified phalanx of right ring finger, initial encounter for open fracture 04/23/2019   Left knee pain 06/14/2018   Pain in right ankle and joints of right foot 12/18/2017   Bilateral edema of lower extremity 10/30/2015   Onychomycosis 10/30/2015   Sepsis ([place]) 08/31/2015   History of pulmonary embolism    History of DVT (deep vein thrombosis)    Pyrexia    Cellulitis 03/18/2015   Chronic anticoagulation 03/17/2015   Morbid obesity with BMI of 45.0-49.9, adult ([place]) 03/17/2015   S/P IVC filter 03/17/2015   Genetic testing 01/12/2015   Postoperative  wound infection 12/09/2013   Postop check 11/24/2013   Ulcer of lower limb, unspecified 08/24/2013   Inguinal lymphadenopathy 08/16/2013   Encounter for therapeutic drug monitoring 06/09/2013   Cellulitis of left lower extremity 04/04/2013   Osteoarthritis of left knee 11/02/2012   Cellulitis of right lower extremity 04/30/2012   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 04/29/2012   Status post splenectomy 04/29/2012   Incisional hernia 05/27/2011   Splenic marginal zone b-cell lymphoma ([place]) 04/16/2010   Morbid obesity ([place]) 04/19/2008   Obstructive sleep apnea 04/19/2008   VARICOSE VEINS, LOWER EXTREMITIES 04/19/2008    PULMONARY EMBOLISM, HX OF 04/19/2008   Past Medical History:  Diagnosis Date   Anorectal fistula    Arthritis    Blood transfusion without reported diagnosis    Cancer ([facility])    lymphoma hx   Cataract    Cellulitis 04/04/2013   DVT (deep venous thrombosis) ([place])    2011 right leg   History of lymphoma    History of pulmonary embolus ([facility])    Incisional hernia    Morbid obesity ([facility])    OSA on CPAP    cpap  10 yrs   Varicose veins of lower extremities    pe    Family History  Problem Relation Age of Onset   Heart disease Father    Varicose Veins Father    Heart attack Father        dx. 49s   Heart disease Mother 34   Varicose Veins Mother    Pancreatic cancer Sister 65   Cancer Maternal Grandmother        either stomach or pancreatic primary    Pancreatic cancer Maternal Grandfather        dx. 71s   Lung cancer Maternal Aunt        secondhand smoke exposure   Heart attack Paternal Uncle    Rectal cancer Maternal Aunt        dx. 79s   Cancer Maternal Aunt        unknown type   Diabetes Paternal Uncle    Cancer Cousin        unknown type   Breast cancer Cousin        dx. 72s    Past Surgical History:  Procedure Laterality Date   EYE SURGERY Left 03   cat, detached ret     FRACTURE SURGERY     HERNIA REPAIR     INGUINAL LYMPH NODE BIOPSY [name] 11/15/2013   Procedure: RIGHT INGUINAL LYMPH NODE BIOPSY;  Surgeon: Gwenyth Ober, MD;  Location: [facility];  Service: General;  Laterality: [name];   ivc filter     PROSTATE SURGERY     (patient denies)   SPLENECTOMY     TONSILLECTOMY     age 25   TOTAL KNEE ARTHROPLASTY Left 11/02/2012   Procedure: TOTAL KNEE ARTHROPLASTY with revision tibia;  Surgeon: Garald Balding, MD;  Location: [facility];  Service: Orthopedics;  Laterality: Left;   Social History   Occupational History   Not on file  Tobacco Use   Smoking status: Former    Packs/day: 2.00    Years: 20.00    Pack years: 40.00    Types: Cigarettes    Quit date:  05/05/1984    Years since quitting: 37.0   Smokeless tobacco: Never  Vaping Use   Vaping Use: Never used  Substance and Sexual Activity   Alcohol use: Yes    Alcohol/week: 0.0 standard drinks  Comment: maybe 1 beer/month   Drug use: No   Sexual activity: Yes    Birth control/protection: None

## 2021-05-14 NOTE — Assessment & Plan Note (Signed)
Benefits from CPAP with good compliance and control. Plan-we can continue auto 4-10 but again ordering replacement for old CPAP machine

## 2021-05-14 NOTE — Assessment & Plan Note (Signed)
He loves to cook and even though he swims regularly, he has not been able to get his weight down.

## 2021-05-21 DIAGNOSIS — Z6841 Body Mass Index (BMI) 40.0 and over, adult: Secondary | ICD-10-CM | POA: Diagnosis not present

## 2021-05-21 DIAGNOSIS — H10412 Chronic giant papillary conjunctivitis, left eye: Secondary | ICD-10-CM | POA: Diagnosis not present

## 2021-05-22 ENCOUNTER — Other Ambulatory Visit: Payer: Self-pay

## 2021-05-22 ENCOUNTER — Ambulatory Visit
Admission: RE | Admit: 2021-05-22 | Discharge: 2021-05-22 | Disposition: A | Discharge status: Home / Self Care | Payer: Medicare Other | Source: Ambulatory Visit | Attending: Internal Medicine | Admitting: Internal Medicine

## 2021-05-22 ENCOUNTER — Other Ambulatory Visit: Payer: Self-pay | Admitting: Internal Medicine

## 2021-05-22 DIAGNOSIS — M546 Pain in thoracic spine: Secondary | ICD-10-CM

## 2021-05-22 DIAGNOSIS — L03119 Cellulitis of unspecified part of limb: Secondary | ICD-10-CM | POA: Diagnosis not present

## 2021-05-22 DIAGNOSIS — L820 Inflamed seborrheic keratosis: Secondary | ICD-10-CM | POA: Diagnosis not present

## 2021-05-27 ENCOUNTER — Ambulatory Visit: Payer: Medicare Other | Admitting: Podiatry

## 2021-05-31 DIAGNOSIS — H1820 Unspecified corneal edema: Secondary | ICD-10-CM | POA: Diagnosis not present

## 2021-06-03 ENCOUNTER — Other Ambulatory Visit: Payer: Self-pay

## 2021-06-03 ENCOUNTER — Ambulatory Visit (INDEPENDENT_AMBULATORY_CARE_PROVIDER_SITE_OTHER): Payer: Medicare Other | Admitting: Podiatry

## 2021-06-03 DIAGNOSIS — D689 Coagulation defect, unspecified: Secondary | ICD-10-CM | POA: Diagnosis not present

## 2021-06-03 DIAGNOSIS — R251 Tremor, unspecified: Secondary | ICD-10-CM | POA: Insufficient documentation

## 2021-06-03 DIAGNOSIS — R234 Changes in skin texture: Secondary | ICD-10-CM | POA: Diagnosis not present

## 2021-06-03 DIAGNOSIS — H6120 Impacted cerumen, unspecified ear: Secondary | ICD-10-CM | POA: Insufficient documentation

## 2021-06-03 DIAGNOSIS — L820 Inflamed seborrheic keratosis: Secondary | ICD-10-CM | POA: Insufficient documentation

## 2021-06-03 DIAGNOSIS — B351 Tinea unguium: Secondary | ICD-10-CM | POA: Diagnosis not present

## 2021-06-03 DIAGNOSIS — E559 Vitamin D deficiency, unspecified: Secondary | ICD-10-CM | POA: Insufficient documentation

## 2021-06-03 DIAGNOSIS — I82409 Acute embolism and thrombosis of unspecified deep veins of unspecified lower extremity: Secondary | ICD-10-CM | POA: Insufficient documentation

## 2021-06-03 DIAGNOSIS — M79674 Pain in right toe(s): Secondary | ICD-10-CM | POA: Diagnosis not present

## 2021-06-03 DIAGNOSIS — K603 Anal fistula: Secondary | ICD-10-CM | POA: Insufficient documentation

## 2021-06-03 DIAGNOSIS — M79675 Pain in left toe(s): Secondary | ICD-10-CM

## 2021-06-03 DIAGNOSIS — Z860100 Personal history of colon polyps, unspecified: Secondary | ICD-10-CM | POA: Insufficient documentation

## 2021-06-03 DIAGNOSIS — L84 Corns and callosities: Secondary | ICD-10-CM

## 2021-06-03 DIAGNOSIS — F419 Anxiety disorder, unspecified: Secondary | ICD-10-CM | POA: Insufficient documentation

## 2021-06-03 DIAGNOSIS — N529 Male erectile dysfunction, unspecified: Secondary | ICD-10-CM | POA: Insufficient documentation

## 2021-06-03 DIAGNOSIS — Z8579 Personal history of other malignant neoplasms of lymphoid, hematopoietic and related tissues: Secondary | ICD-10-CM | POA: Insufficient documentation

## 2021-06-03 DIAGNOSIS — G250 Essential tremor: Secondary | ICD-10-CM | POA: Insufficient documentation

## 2021-06-03 DIAGNOSIS — K604 Rectal fistula: Secondary | ICD-10-CM | POA: Insufficient documentation

## 2021-06-03 DIAGNOSIS — G629 Polyneuropathy, unspecified: Secondary | ICD-10-CM | POA: Insufficient documentation

## 2021-06-05 DIAGNOSIS — H16102 Unspecified superficial keratitis, left eye: Secondary | ICD-10-CM | POA: Diagnosis not present

## 2021-06-05 DIAGNOSIS — H1820 Unspecified corneal edema: Secondary | ICD-10-CM | POA: Diagnosis not present

## 2021-06-09 ENCOUNTER — Encounter: Payer: Self-pay | Admitting: Podiatry

## 2021-06-09 NOTE — Progress Notes (Signed)
°  Subjective:  Patient ID: Marc Mills, male    DOB: 1945/11/20,  MRN: [id number]  Marc Mills presents to clinic today for at risk foot care with h/o clotting disorder and callus(es) left hallux and painful thick toenails that are difficult to trim. Painful toenails interfere with ambulation. Aggravating factors include wearing enclosed shoe gear. Pain is relieved with periodic professional debridement. Painful calluses are aggravated when weightbearing with and without shoegear. Pain is relieved with periodic professional debridement.  Patient has h/o fissuring of left hallux. He was instructed to apply Neosporin daily until resolved on last visit.  He also has h/o recurrent cellulitis of the lower extremities managed chronically with Keflex.  PCP is Marc Huddle, MD , and last visit was 09/06/2020.  Allergies  Allergen Reactions   Hydrocodone Itching and Other (See Comments)    Severe dizziness, Gives me chills   Hydrocodone-Guaifenesin Other (See Comments)    Other reaction(s): chills and makes him feel bad   Hydromorphone Hcl Other (See Comments)   Oxycodone Itching and Other (See Comments)    Severe dizziness, Gives me chills    Review of Systems: Negative except as noted in the HPI. Objective:   Constitutional Marc Mills is a pleasant 76 y.o. Caucasian male, morbidly obese in NAD. AAO x 3.   Vascular CFT <3 seconds b/l LE. Palpable DP pulse(s) b/l LE. Palpable PT pulse(s) b/l LE. No pain with calf compression b/l. Lower extremity skin temperature gradient within normal limits. Nonpitting edema noted BLE. Varicosities present b/l. Evidence of chronic venous insufficiency b/l LE. No cyanosis or clubbing noted b/l LE.  Neurologic Normal speech. Oriented to person, place, and time. Protective sensation intact 5/5 intact bilaterally with 10g monofilament b/l.  Dermatologic No interdigital macerations noted b/l LE. Toenails 1-5 bilaterally elongated, discolored,  dystrophic, thickened, and crumbly with subungual debris and tenderness to dorsal palpation. Fissured hyperkeratotic lesion(s) noted L hallux. Left Hallux: Medially, he has fissuring which measures 2.5 x 0.4 cm and plantarly he has fissuring which measures 1.0 x 0.2 cm. No erythema, no edema, no drainage, no fluctuance, no warmth/coolness, no ischemia.  Orthopedic: Muscle strength 5/5 to all lower extremity muscle groups bilaterally. Hallux hammertoe noted b/l LE. Patient ambulates independent of any assistive aids.   Radiographs: None  Last A1c: No flowsheet data found.   Assessment:   1. Pain due to onychomycosis of toenails of both feet   2. Callus   3. Fissure in skin of foot   4. Clotting disorder [facility])    Plan:  Patient was evaluated and treated and all questions answered. Consent given for treatment as described below: -Mycotic toenails 1-5 bilaterally were debrided in length and girth with sterile nail nippers and dremel without incident. -Fissured callus plantar aspect left hallux pared to level of healthy epithelialization. Triple antibiotic ointment applied. Patient instructed to apply Neosporin Ointment once daily to assist with dryness/cracking. He related understanding. -Patient/POA to call should there be question/concern in the interim.  Return in about 3 months (around 09/01/2021).  Marzetta Board, DPM

## 2021-06-27 ENCOUNTER — Ambulatory Visit (INDEPENDENT_AMBULATORY_CARE_PROVIDER_SITE_OTHER): Payer: Medicare Other | Admitting: Orthopaedic Surgery

## 2021-06-27 ENCOUNTER — Encounter: Payer: Self-pay | Admitting: Orthopaedic Surgery

## 2021-06-27 ENCOUNTER — Ambulatory Visit (INDEPENDENT_AMBULATORY_CARE_PROVIDER_SITE_OTHER): Payer: Medicare Other

## 2021-06-27 ENCOUNTER — Other Ambulatory Visit: Payer: Self-pay

## 2021-06-27 VITALS — Ht 71.0 in | Wt 330.0 lb

## 2021-06-27 DIAGNOSIS — M545 Low back pain, unspecified: Secondary | ICD-10-CM | POA: Diagnosis not present

## 2021-06-27 DIAGNOSIS — M5442 Lumbago with sciatica, left side: Secondary | ICD-10-CM

## 2021-06-27 DIAGNOSIS — M5441 Lumbago with sciatica, right side: Secondary | ICD-10-CM

## 2021-06-27 DIAGNOSIS — M542 Cervicalgia: Secondary | ICD-10-CM

## 2021-06-27 DIAGNOSIS — G8929 Other chronic pain: Secondary | ICD-10-CM

## 2021-06-27 MED ORDER — TRAMADOL HCL 50 MG PO TABS: 50.0000 mg | ORAL_TABLET | Freq: Two times a day (BID) | ORAL | 0 refills | Status: DC | PRN

## 2021-06-27 NOTE — Progress Notes (Signed)
Office Visit Note   Patient: Marc Mills           Date of Birth: 1945/06/10           MRN: [id number] Visit Date: 06/27/2021              Requested by: Josetta Huddle, MD [address] [place],  [place] [id number] PCP: Josetta Huddle, MD   Assessment & Plan: Visit Diagnoses:  1. Neck pain   2. Chronic right-sided low back pain, unspecified whether sciatica present   3. Acute bilateral low back pain with bilateral sciatica     Plan: Marc Mills comes to the office today with a rather cute onset of low back pain.  Pain started about 2 weeks ago and it may have coincided with his swimming with one of his grandchildren.  He has had some low back pain with some discomfort into both lower extremities.  He denies any bowel or bladder changes.  There is been no numbness or tingling.  He is also had some neck pain but not sure if it is referred or primary.  He does have some degenerative change in the cervical spine but in the lumbar spine there is an impaction of the L4 vertebral body and to the superior endplate of L5.  Basically the posterior inferior angle of the L4 is now impacted with narrowing of the disc space and an anterior listhesis of L4 on 5 this is probably somewhat acute but neurologically is intact.  We will order a stat MRI scan and tramadol for pain  Follow-Up Instructions: Return After MRI lumbar spine.   Orders:  Orders Placed This Encounter  Procedures   XR Cervical Spine 2 or 3 views   XR Lumbar Spine 2-3 Views   MR Lumbar Spine w/o contrast   No orders of the defined types were placed in this encounter.     Procedures: No procedures performed   Clinical Data: No additional findings.   Subjective: Chief Complaint  Patient presents with   Neck - Pain   Lower Back - Pain  Patient presents today for spine pain. He said that it started two weeks ago. No known injury. He has pain that starts in his neck and ends at his lower back. The pain is  constant. He does have some right leg pain and numbness. His arms are sore. He takes Tylenol, and states that swimming and heat helps. No prior spine surgery.  No related problems with bowel or bladder function.  No numbness or tingling  HPI  Review of Systems   Objective: Vital Signs: Ht '5\' 11"'  (1.803 m)    Wt (!) 330 lb (149.7 kg)    BMI 46.03 kg/m   Physical Exam Constitutional:      Appearance: He is well-developed.  Pulmonary:     Effort: Pulmonary effort is normal.  Skin:    General: Skin is warm and dry.  Neurological:     Mental Status: He is alert and oriented to person, place, and time.  Psychiatric:        Behavior: Behavior normal.    Ortho Exam awake alert and oriented x3.  Comfortable sitting.  Straight leg raise is negative bilaterally.  He had good reflexes and good motor strength.  No calf pain no pain with range of motion of either hip.  He did have some percussible tenderness lower lumbar spine but it was relatively mild.  Some limitation of motion cervical spine  but no referred pain to either upper or lower extremity with motion.  Good grip and release  Specialty Comments:  No specialty comments available.  Imaging: XR Cervical Spine 2 or 3 views  Result Date: 06/27/2021 Films of the cervical spine were obtained in 2 projections.  There is significant degenerative disc disease at C4-5, C5-6, C6-7 with narrowing of the disc spaces and anterior osteophytes.  No listhesis.  Proximal cervical spine appears to be intact.  No acute changes  XR Lumbar Spine 2-3 Views  Result Date: 06/27/2021 Films of the lumbar spine obtained in 2 projections.  There is degenerative disc disease at T12-L1 but a more acute problem at C4-5 where there is anterior listhesis of 4 on 5.  The posterior inferior angle of L4 has impacted into the body of L5.    PMFS History: Patient Active Problem List   Diagnosis Date Noted   Low back pain 06/27/2021   Acute embolism and thrombosis of  unspecified deep veins of unspecified lower extremity ([facility]) 06/03/2021   Anal fistula 06/03/2021   Anxiety 06/03/2021   Erectile dysfunction 06/03/2021   Essential tremor 06/03/2021   History of lymphoma 06/03/2021   Impacted cerumen 06/03/2021   Peripheral neuropathy 06/03/2021   Personal history of colonic polyps 06/03/2021   Rectal fistula 06/03/2021   Inflamed seborrheic keratosis 06/03/2021   Vitamin D deficiency 06/03/2021   Tremor 06/03/2021   Eustachian tube dysfunction, left 12/10/2020   Otalgia, left 12/10/2020   Nondisplaced fracture of distal phalanx of left middle finger, initial encounter for open fracture 09/18/2020   Pain in right hip 03/27/2020   Laceration of right ring finger 05/05/2019   Laceration of flexor muscle, fascia and tendon of right ring finger at wrist and hand level, initial encounter 04/23/2019   Unspecified fracture of the lower end of right radius, initial encounter for closed fracture 04/23/2019   Fracture of unspecified phalanx of right ring finger, initial encounter for open fracture 04/23/2019   Left knee pain 06/14/2018   Pain in right ankle and joints of right foot 12/18/2017   Bilateral edema of lower extremity 10/30/2015   Onychomycosis 10/30/2015   Sepsis ([place]) 08/31/2015   History of pulmonary embolism    History of DVT (deep vein thrombosis)    Pyrexia    Cellulitis 03/18/2015   Chronic anticoagulation 03/17/2015   Morbid obesity with BMI of 45.0-49.9, adult ([facility]) 03/17/2015   S/P IVC filter 03/17/2015   Genetic testing 01/12/2015   Postoperative wound infection 12/09/2013   Postop check 11/24/2013   Ulcer of lower limb, unspecified 08/24/2013   Inguinal lymphadenopathy 08/16/2013   Encounter for therapeutic drug monitoring 06/09/2013   Cellulitis of left lower extremity 04/04/2013   Osteoarthritis of left knee 11/02/2012   Cellulitis of right lower extremity 04/30/2012   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 04/29/2012   Status  post splenectomy 04/29/2012   Incisional hernia 05/27/2011   Splenic marginal zone b-cell lymphoma ([place]) 04/16/2010   Morbid obesity ([facility]) 04/19/2008   Obstructive sleep apnea 04/19/2008   VARICOSE VEINS, LOWER EXTREMITIES 04/19/2008   PULMONARY EMBOLISM, HX OF 04/19/2008   Past Medical History:  Diagnosis Date   Anorectal fistula    Arthritis    Blood transfusion without reported diagnosis    Cancer ([facility])    lymphoma hx   Cataract    Cellulitis 04/04/2013   DVT (deep venous thrombosis) ([facility])    2011 right leg   History of lymphoma    History of  pulmonary embolus (PE)    Incisional hernia    Morbid obesity ([facility])    OSA on CPAP    cpap  10 yrs   Varicose veins of lower extremities    pe    Family History  Problem Relation Age of Onset   Heart disease Father    Varicose Veins Father    Heart attack Father        dx. 48s   Heart disease Mother 70   Varicose Veins Mother    Pancreatic cancer Sister 47   Cancer Maternal Grandmother        either stomach or pancreatic primary    Pancreatic cancer Maternal Grandfather        dx. 55s   Lung cancer Maternal Aunt        secondhand smoke exposure   Heart attack Paternal Uncle    Rectal cancer Maternal Aunt        dx. 35s   Cancer Maternal Aunt        unknown type   Diabetes Paternal Uncle    Cancer Cousin        unknown type   Breast cancer Cousin        dx. 17s    Past Surgical History:  Procedure Laterality Date   EYE SURGERY Left 03   cat, detached ret     FRACTURE SURGERY     HERNIA REPAIR     INGUINAL LYMPH NODE BIOPSY [name] 11/15/2013   Procedure: RIGHT INGUINAL LYMPH NODE BIOPSY;  Surgeon: Gwenyth Ober, MD;  Location: [facility];  Service: General;  Laterality: [name];   ivc filter     PROSTATE SURGERY     (patient denies)   SPLENECTOMY     TONSILLECTOMY     age 4   TOTAL KNEE ARTHROPLASTY Left 11/02/2012   Procedure: TOTAL KNEE ARTHROPLASTY with revision tibia;  Surgeon: Garald Balding, MD;  Location: [facility];   Service: Orthopedics;  Laterality: Left;   Social History   Occupational History   Not on file  Tobacco Use   Smoking status: Former    Packs/day: 2.00    Years: 20.00    Pack years: 40.00    Types: Cigarettes    Quit date: 05/05/1984    Years since quitting: 37.1   Smokeless tobacco: Never  Vaping Use   Vaping Use: Never used  Substance and Sexual Activity   Alcohol use: Yes    Alcohol/week: 0.0 standard drinks    Comment: maybe 1 beer/month   Drug use: No   Sexual activity: Yes    Birth control/protection: None

## 2021-06-28 ENCOUNTER — Ambulatory Visit
Admission: RE | Admit: 2021-06-28 | Discharge: 2021-06-28 | Disposition: A | Discharge status: Home / Self Care | Payer: Medicare Other | Source: Ambulatory Visit | Attending: Orthopaedic Surgery | Admitting: Orthopaedic Surgery

## 2021-06-28 DIAGNOSIS — G8929 Other chronic pain: Secondary | ICD-10-CM

## 2021-06-28 DIAGNOSIS — M545 Low back pain, unspecified: Secondary | ICD-10-CM

## 2021-06-28 DIAGNOSIS — M4316 Spondylolisthesis, lumbar region: Secondary | ICD-10-CM | POA: Diagnosis not present

## 2021-07-01 ENCOUNTER — Other Ambulatory Visit: Payer: Self-pay | Admitting: Podiatry

## 2021-07-01 ENCOUNTER — Other Ambulatory Visit: Payer: Self-pay

## 2021-07-01 DIAGNOSIS — M545 Low back pain, unspecified: Secondary | ICD-10-CM

## 2021-07-01 DIAGNOSIS — G8929 Other chronic pain: Secondary | ICD-10-CM

## 2021-07-03 ENCOUNTER — Ambulatory Visit (INDEPENDENT_AMBULATORY_CARE_PROVIDER_SITE_OTHER): Payer: Medicare Other | Admitting: Podiatry

## 2021-07-03 ENCOUNTER — Other Ambulatory Visit: Payer: Self-pay

## 2021-07-03 DIAGNOSIS — L97522 Non-pressure chronic ulcer of other part of left foot with fat layer exposed: Secondary | ICD-10-CM

## 2021-07-08 ENCOUNTER — Telehealth: Payer: Self-pay | Admitting: *Deleted

## 2021-07-08 ENCOUNTER — Other Ambulatory Visit: Payer: Self-pay | Admitting: Internal Medicine

## 2021-07-08 NOTE — Telephone Encounter (Signed)
-----   Message from Lendon Collar, RT sent at 07/08/2021 12:18 PM EST ----- ?Can we check on the status of his injection at [facility]? Dr.Whitfield wanted him to have it last week because he has a vacation next week.  ? ?

## 2021-07-08 NOTE — Telephone Encounter (Signed)
[place] with [facility], she is waiting on clearance from Dr. Inda Merlin office for pt to have ESI done.  ?

## 2021-07-08 NOTE — Telephone Encounter (Signed)
I called Marc Mills with [facility] asking about the status of appt for ESI, I saw in Appt desk they had faxed and refaxed a clearance letter but didn't say where it was sent. Waiting on call back from [place] ?

## 2021-07-09 NOTE — Progress Notes (Signed)
?Subjective:  ?Patient ID: Marc Mills, male    DOB: 10-Aug-1945,  MRN: [id number] ? ?Chief Complaint  ?Patient presents with  ? Toe Pain  ?  Left hallux toe has a split in the skin   ? ? ?76 y.o. male presents for wound care.  Patient presents with complaint left hallux ulceration after undergoing splitting of the hallux toe.  Patient is known to Dr. Adah Perl.  He states been present for quite some time is progressive gotten worse.  He has not seen anyone else prior to seeing me.  He denies any other acute complaints.  He is not a diabetic.  He would like to discuss treatment options for this.  He states his pain is manageable. ? ?Review of Systems: Negative except as noted in the HPI. Denies N/V/F/Ch. ? ?Past Medical History:  ?Diagnosis Date  ? Anorectal fistula   ? Arthritis   ? Blood transfusion without reported diagnosis   ? Cancer [facility])   ? lymphoma hx  ? Cataract   ? Cellulitis 04/04/2013  ? DVT (deep venous thrombosis) ([place])   ? 2011 right leg  ? History of lymphoma   ? History of pulmonary embolus ([facility])   ? Incisional hernia   ? Morbid obesity ([place])   ? OSA on CPAP   ? cpap  10 yrs  ? Varicose veins of lower extremities   ? pe  ? ? ?Current Outpatient Medications:  ?  acetaminophen (TYLENOL) 500 MG tablet, Take 1,000 mg by mouth every 6 (six) hours as needed for mild pain., Disp: , Rfl:  ?  Acetylcysteine (NAC) 500 MG CAPS, 1 capsule, Disp: , Rfl:  ?  amoxicillin (AMOXIL) 500 MG capsule, SMARTSIG:4 Capsule(s) By Mouth, Disp: , Rfl:  ?  amoxicillin-clavulanate (AUGMENTIN) 875-125 MG tablet, SMARTSIG:1 Tablet(s) By Mouth Every 12 Hours, Disp: , Rfl:  ?  Ascorbic Acid (VITAMIN C) 500 MG CAPS, 1 tablet, Disp: , Rfl:  ?  B Complex Vitamins (VITAMIN B COMPLEX) TABS, 1 tablet, Disp: , Rfl:  ?  BINAXNOW COVID-19 AG HOME TEST KIT, TEST AS DIRECTED TODAY, Disp: , Rfl:  ?  calcium carbonate (OS-CAL) 600 MG TABS, Take 600 mg by mouth every morning. , Disp: , Rfl:  ?  cephALEXin (KEFLEX) 500 MG capsule, 1 capsule,  Disp: , Rfl:  ?  cetirizine (ZYRTEC) 5 MG tablet, Take 1 tablet by mouth daily., Disp: , Rfl:  ?  cholecalciferol (VITAMIN D) 1000 UNITS tablet, Take 1,000 Units by mouth every morning. , Disp: , Rfl:  ?  Cholecalciferol (VITAMIN D3 ULTRA STRENGTH) 125 MCG (5000 UT) capsule, 1 capsule, Disp: , Rfl:  ?  Coenzyme Q10 (COQ-10) 30 MG CAPS, 1 capsule with a meal, Disp: , Rfl:  ?  diclofenac Sodium (VOLTAREN) 1 % GEL, One gram, Disp: , Rfl:  ?  Ginger, Zingiber officinalis, (GINGER EXTRACT) 250 MG CAPS, 1 capsule, Disp: , Rfl:  ?  LORazepam (ATIVAN) 0.5 MG tablet, TAKE 1 TO 2 TABLETS BY MOUTH AS NEEDED EVERY 12 HOURS, Disp: , Rfl: 1 ?  LORazepam (ATIVAN) 0.5 MG tablet, 1-2 tablets as needed, Disp: , Rfl:  ?  Magnesium 400 MG CAPS, Take 400 mg by mouth daily. , Disp: , Rfl:  ?  Multiple Vitamin (MULTIVITAMIN ADULT) TABS, 1 tablet, Disp: , Rfl:  ?  Multiple Vitamins-Minerals (ONCOVITE) TABS, Take by mouth., Disp: , Rfl:  ?  neomycin-polymyxin b-dexamethasone (MAXITROL) 3.5-10000-0.1 SUSP, Place into the left eye., Disp: , Rfl:  ?  Nutritional Supplements (SALMON OIL) CAPS, Take 1 capsule by mouth daily., Disp: , Rfl:  ?  prednisoLONE acetate (PRED FORTE) 1 % ophthalmic suspension, Place into the left eye., Disp: , Rfl:  ?  Quercetin 50 MG TABS, 1 tablet, Disp: , Rfl:  ?  rivaroxaban (XARELTO) 20 MG TABS tablet, Take 1 tablet by mouth daily., Disp: , Rfl:  ?  SHINGRIX injection, , Disp: , Rfl:  ?  sildenafil (REVATIO) 20 MG tablet, 3-5 tablets, Disp: , Rfl:  ?  TETANUS-DIPHTHERIA TOXOIDS TD IM, Inject into the muscle., Disp: , Rfl:  ?  traMADol (ULTRAM) 50 MG tablet, Take 1 tablet (50 mg total) by mouth every 12 (twelve) hours as needed., Disp: 30 tablet, Rfl: 0 ?  traMADol HCl 100 MG TABS, , Disp: , Rfl:  ?  VIAGRA 100 MG tablet, TAKE 1 TABLET BY MOUTH EVERY DAY AS NEEDED FOR ERECTILE DYSFUNCTION . SPLIT TABLET IN HALF AS NEEDED, Disp: 10 tablet, Rfl: 0 ?  vitamin E 180 MG (400 UNITS) capsule, 1 capsule, Disp: , Rfl:  ?   Zinc 30 MG TABS, 1 tablet, Disp: , Rfl:  ? ?Social History  ? ?Tobacco Use  ?Smoking Status Former  ? Packs/day: 2.00  ? Years: 20.00  ? Pack years: 40.00  ? Types: Cigarettes  ? Quit date: 05/05/1984  ? Years since quitting: 37.2  ?Smokeless Tobacco Never  ? ? ?Allergies  ?Allergen Reactions  ? Hydrocodone Itching and Other (See Comments)  ?  Severe dizziness, Gives me chills  ? Hydrocodone-Guaifenesin Other (See Comments)  ?  Other reaction(s): chills and makes him feel bad  ? Hydromorphone Hcl Other (See Comments)  ? Oxycodone Itching and Other (See Comments)  ?  Severe dizziness, Gives me chills  ? ?Objective:  ?There were no vitals filed for this visit. ?There is no height or weight on file to calculate BMI. ?Constitutional Well developed. ?Well nourished.  ?Vascular Dorsalis pedis pulses palpable bilaterally. ?Posterior tibial pulses palpable bilaterally. ?Capillary refill normal to all digits.  ?No cyanosis or clubbing noted. ?Pedal hair growth normal.  ?Neurologic Normal speech. ?Oriented to person, place, and time. ?Protective sensation absent  ?Dermatologic Wound Location: Left hallux ulceration with fat layer exposed.  Does not probe down to deep tissue.  Signs of skin fissuring noted.  No malodor present.  No redness noted. ?Wound Base: Mixed Granular/Fibrotic ?Peri-wound: Calloused ?Exudate: Scant/small amount Serosanguinous exudate ?Wound Measurements: ?-See below  ?Orthopedic: No pain to palpation either foot.  ? ?Radiographs: None ?Assessment:  ? ?1. Skin ulcer of left great toe with fat layer exposed ([place])   ? ?Plan:  ?Patient was evaluated and treated and all questions answered. ? ?Ulcer left hallux fat layer exposed ?-Debridement as below. ?-Dressed with Betadine wet-to-dry, DSD. ?-Continue off-loading with surgical shoe. ? ?Procedure: Excisional Debridement of Wound ?Tool: Sharp chisel blade/tissue nipper ?Rationale: Removal of non-viable soft tissue from the wound to promote healing.  ?Anesthesia:  none ?Pre-Debridement Wound Measurements: 1 cm x 0.3 cm x 0.3 cm  ?Post-Debridement Wound Measurements: 1.2 cm x 0.4 cm x 0.3 cm  ?Type of Debridement: Sharp Excisional ?Tissue Removed: Non-viable soft tissue ?Blood loss: Minimal (<50cc) ?Depth of Debridement: subcutaneous tissue. ?Technique: Sharp excisional debridement to bleeding, viable wound base.  ?Wound Progress: This is my initial evaluation of continue monitor the progression of the wound. ?Site healing conversation 7 ?Dressing: Dry, sterile, compression dressing. ?Disposition: Patient tolerated procedure well. Patient to return in 1 week for follow-up. ? ?No follow-ups  on file. ? ?  ?

## 2021-07-09 NOTE — Telephone Encounter (Signed)
Called patient and left voicemail with this information. I encouraged him to call Dr.Gates office so that hopefully it will speed things up for him, since he is wanting this done before his vacation.  ?

## 2021-07-10 ENCOUNTER — Other Ambulatory Visit: Payer: Self-pay | Admitting: Internal Medicine

## 2021-07-11 ENCOUNTER — Ambulatory Visit
Admission: RE | Admit: 2021-07-11 | Discharge: 2021-07-11 | Disposition: A | Discharge status: Home / Self Care | Payer: Medicare Other | Source: Ambulatory Visit | Attending: Orthopaedic Surgery | Admitting: Orthopaedic Surgery

## 2021-07-11 DIAGNOSIS — G8929 Other chronic pain: Secondary | ICD-10-CM

## 2021-07-11 DIAGNOSIS — M4316 Spondylolisthesis, lumbar region: Secondary | ICD-10-CM | POA: Diagnosis not present

## 2021-07-11 DIAGNOSIS — R531 Weakness: Secondary | ICD-10-CM | POA: Diagnosis not present

## 2021-07-11 MED ORDER — IOPAMIDOL (ISOVUE-M 200) INJECTION 41%
1.0000 mL | Freq: Once | INTRAMUSCULAR | Status: AC
  Administered 2021-07-11: 1 mL via EPIDURAL

## 2021-07-11 MED ORDER — METHYLPREDNISOLONE ACETATE 40 MG/ML INJ SUSP (RADIOLOG
80.0000 mg | Freq: Once | INTRAMUSCULAR | Status: AC
  Administered 2021-07-11: 80 mg via EPIDURAL

## 2021-07-11 NOTE — Discharge Instructions (Signed)
Post Procedure Spinal Discharge Instruction Sheet ? ?You may resume a regular diet and any medications that you routinely take (including pain medications) unless otherwise noted by MD. ? ?No driving day of procedure. ? ?Light activity throughout the rest of the day.  Do not do any strenuous work, exercise, bending or lifting.  The day following the procedure, you can resume normal physical activity but you should refrain from exercising or physical therapy for at least three days thereafter. ? ?You may apply ice to the injection site, 20 minutes on, 20 minutes off, as needed. Do not apply ice directly to skin.  ? ? ?Common Side Effects: ? ?Headaches- take your usual medications as directed by your physician.  Increase your fluid intake.  Caffeinated beverages may be helpful.  Lie flat in bed until your headache resolves. ? ?Restlessness or inability to sleep- you may have trouble sleeping for the next few days.  Ask your referring physician if you need any medication for sleep. ? ?Facial flushing or redness- should subside within a few days. ? ?Increased pain- a temporary increase in pain a day or two following your procedure is not unusual.  Take your pain medication as prescribed by your referring physician. ? ?Leg cramps ? ?Please contact our office at [phone] for the following symptoms: ?Fever greater than 100 degrees. ?Headaches unresolved with medication after 2-3 days. ?Increased swelling, pain, or redness at injection site. ? ? ?Thank you for visiting [facility] today.   ? ?YOU MAY RESUME YOUR XARELTO IN 24 HOURS.  ?

## 2021-07-12 DIAGNOSIS — H401122 Primary open-angle glaucoma, left eye, moderate stage: Secondary | ICD-10-CM | POA: Diagnosis not present

## 2021-07-22 ENCOUNTER — Other Ambulatory Visit: Payer: Self-pay | Admitting: Internal Medicine

## 2021-07-22 ENCOUNTER — Other Ambulatory Visit: Payer: Self-pay

## 2021-07-22 DIAGNOSIS — G8929 Other chronic pain: Secondary | ICD-10-CM

## 2021-07-24 ENCOUNTER — Other Ambulatory Visit: Payer: Self-pay

## 2021-07-24 ENCOUNTER — Ambulatory Visit (INDEPENDENT_AMBULATORY_CARE_PROVIDER_SITE_OTHER): Payer: Medicare Other | Admitting: Podiatry

## 2021-07-24 DIAGNOSIS — L97522 Non-pressure chronic ulcer of other part of left foot with fat layer exposed: Secondary | ICD-10-CM | POA: Diagnosis not present

## 2021-07-24 DIAGNOSIS — I999 Unspecified disorder of circulatory system: Secondary | ICD-10-CM

## 2021-07-24 MED ORDER — SANTYL 250 UNIT/GM EX OINT: 1.0000 "application " | TOPICAL_OINTMENT | Freq: Every day | CUTANEOUS | 0 refills | Status: DC

## 2021-07-26 ENCOUNTER — Encounter: Payer: Self-pay | Admitting: Podiatry

## 2021-07-26 NOTE — Progress Notes (Signed)
?Subjective:  ?Patient ID: Marc Mills, male    DOB: 02/01/1946,  MRN: [id number] ? ?Chief Complaint  ?Patient presents with  ? Foot Ulcer  ?  Left hallux   ? ? ?76 y.o. male presents for wound care.  Patient presents for follow-up of left hallux ulceration.  Patient states that is getting and looking the same.  He is gone with this wound for greater than 4 weeks and has been about stagnant and has not retracted.  He has failed conservative treatment options.  I believe he will benefit from grafting application at this time.  We will work on the approval of grafting ? ?Review of Systems: Negative except as noted in the HPI. Denies N/V/F/Ch. ? ?Past Medical History:  ?Diagnosis Date  ? Anorectal fistula   ? Arthritis   ? Blood transfusion without reported diagnosis   ? Cancer [facility])   ? lymphoma hx  ? Cataract   ? Cellulitis 04/04/2013  ? DVT (deep venous thrombosis) ([place])   ? 2011 right leg  ? History of lymphoma   ? History of pulmonary embolus (PE)   ? Incisional hernia   ? Morbid obesity ([place])   ? OSA on CPAP   ? cpap  10 yrs  ? Varicose veins of lower extremities   ? pe  ? ? ?Current Outpatient Medications:  ?  collagenase (SANTYL) 250 UNIT/GM ointment, Apply 1 application. topically daily., Disp: 15 g, Rfl: 0 ?  acetaminophen (TYLENOL) 500 MG tablet, Take 1,000 mg by mouth every 6 (six) hours as needed for mild pain., Disp: , Rfl:  ?  Acetylcysteine (NAC) 500 MG CAPS, 1 capsule, Disp: , Rfl:  ?  amoxicillin (AMOXIL) 500 MG capsule, SMARTSIG:4 Capsule(s) By Mouth, Disp: , Rfl:  ?  amoxicillin-clavulanate (AUGMENTIN) 875-125 MG tablet, SMARTSIG:1 Tablet(s) By Mouth Every 12 Hours, Disp: , Rfl:  ?  Ascorbic Acid (VITAMIN C) 500 MG CAPS, 1 tablet, Disp: , Rfl:  ?  B Complex Vitamins (VITAMIN B COMPLEX) TABS, 1 tablet, Disp: , Rfl:  ?  BINAXNOW COVID-19 AG HOME TEST KIT, TEST AS DIRECTED TODAY, Disp: , Rfl:  ?  calcium carbonate (OS-CAL) 600 MG TABS, Take 600 mg by mouth every morning. , Disp: , Rfl:  ?   cephALEXin (KEFLEX) 500 MG capsule, 1 capsule, Disp: , Rfl:  ?  cetirizine (ZYRTEC) 5 MG tablet, Take 1 tablet by mouth daily., Disp: , Rfl:  ?  cholecalciferol (VITAMIN D) 1000 UNITS tablet, Take 1,000 Units by mouth every morning. , Disp: , Rfl:  ?  Cholecalciferol (VITAMIN D3 ULTRA STRENGTH) 125 MCG (5000 UT) capsule, 1 capsule, Disp: , Rfl:  ?  Coenzyme Q10 (COQ-10) 30 MG CAPS, 1 capsule with a meal, Disp: , Rfl:  ?  diclofenac Sodium (VOLTAREN) 1 % GEL, One gram, Disp: , Rfl:  ?  Ginger, Zingiber officinalis, (GINGER EXTRACT) 250 MG CAPS, 1 capsule, Disp: , Rfl:  ?  LORazepam (ATIVAN) 0.5 MG tablet, TAKE 1 TO 2 TABLETS BY MOUTH AS NEEDED EVERY 12 HOURS, Disp: , Rfl: 1 ?  LORazepam (ATIVAN) 0.5 MG tablet, 1-2 tablets as needed, Disp: , Rfl:  ?  Magnesium 400 MG CAPS, Take 400 mg by mouth daily. , Disp: , Rfl:  ?  Multiple Vitamin (MULTIVITAMIN ADULT) TABS, 1 tablet, Disp: , Rfl:  ?  Multiple Vitamins-Minerals (ONCOVITE) TABS, Take by mouth., Disp: , Rfl:  ?  neomycin-polymyxin b-dexamethasone (MAXITROL) 3.5-10000-0.1 SUSP, Place into the left eye., Disp: , Rfl:  ?  Nutritional Supplements (SALMON OIL) CAPS, Take 1 capsule by mouth daily., Disp: , Rfl:  ?  prednisoLONE acetate (PRED FORTE) 1 % ophthalmic suspension, Place into the left eye., Disp: , Rfl:  ?  Quercetin 50 MG TABS, 1 tablet, Disp: , Rfl:  ?  rivaroxaban (XARELTO) 20 MG TABS tablet, Take 1 tablet by mouth daily., Disp: , Rfl:  ?  SHINGRIX injection, , Disp: , Rfl:  ?  sildenafil (REVATIO) 20 MG tablet, 3-5 tablets, Disp: , Rfl:  ?  TETANUS-DIPHTHERIA TOXOIDS TD IM, Inject into the muscle., Disp: , Rfl:  ?  traMADol (ULTRAM) 50 MG tablet, Take 1 tablet (50 mg total) by mouth every 12 (twelve) hours as needed., Disp: 30 tablet, Rfl: 0 ?  traMADol HCl 100 MG TABS, , Disp: , Rfl:  ?  VIAGRA 100 MG tablet, TAKE 1 TABLET BY MOUTH EVERY DAY AS NEEDED FOR ERECTILE DYSFUNCTION . SPLIT TABLET IN HALF AS NEEDED, Disp: 10 tablet, Rfl: 0 ?  vitamin E 180 MG (400  UNITS) capsule, 1 capsule, Disp: , Rfl:  ?  Zinc 30 MG TABS, 1 tablet, Disp: , Rfl:  ? ?Social History  ? ?Tobacco Use  ?Smoking Status Former  ? Packs/day: 2.00  ? Years: 20.00  ? Pack years: 40.00  ? Types: Cigarettes  ? Quit date: 05/05/1984  ? Years since quitting: 37.2  ?Smokeless Tobacco Never  ? ? ?Allergies  ?Allergen Reactions  ? Hydrocodone Itching and Other (See Comments)  ?  Severe dizziness, Gives me chills  ? Hydrocodone-Guaifenesin Other (See Comments)  ?  Other reaction(s): chills and makes him feel bad  ? Hydromorphone Hcl Other (See Comments)  ? Oxycodone Itching and Other (See Comments)  ?  Severe dizziness, Gives me chills  ? ?Objective:  ?There were no vitals filed for this visit. ?There is no height or weight on file to calculate BMI. ?Constitutional Well developed. ?Well nourished.  ?Vascular Dorsalis pedis pulses faint delay palpable bilaterally. ?Posterior tibial pulses none palpable bilaterally. ?Capillary refill diminished to all digits.  ?No cyanosis or clubbing noted. ?Pedal hair growth normal.  ?Neurologic Normal speech. ?Oriented to person, place, and time. ?Protective sensation absent  ?Dermatologic Wound Location: Left hallux ulceration with fat layer exposed.  Does not probe down to deep tissue.  Signs of skin fissuring noted.  No malodor present.  No redness noted. ?Wound Base: Mixed Granular/Fibrotic ?Peri-wound: Calloused ?Exudate: Scant/small amount Serosanguinous exudate ?Wound Measurements: ?-See below  ?Orthopedic: No pain to palpation either foot.  ? ?Radiographs: None ?Assessment:  ? ?1. Skin ulcer of left great toe with fat layer exposed ([place])   ?2. Vascular abnormality   ? ? ?Plan:  ?Patient was evaluated and treated and all questions answered. ? ?Ulcer left hallux fat layer exposed~stagnant ?-Debridement as below. ?-Dressed with Betadine wet-to-dry, DSD. ?-Continue off-loading with surgical shoe. ?-Patient will benefit from graft application. ?-I will also order ABIs to  assess the vascular flow to both lower extremity in preparation for grafting ?-Patient does not have any history of smoking. ? ? ? ?Procedure: Excisional Debridement of Wound ?Tool: Sharp chisel blade/tissue nipper ?Rationale: Removal of non-viable soft tissue from the wound to promote healing.  ?Anesthesia: none ?Pre-Debridement Wound Measurements: 1 cm x 0.3 cm x 0.3 cm  ?Post-Debridement Wound Measurements: 1.2 cm x 0.4 cm x 0.3 cm  ?Type of Debridement: Sharp Excisional ?Tissue Removed: Non-viable soft tissue ?Blood loss: Minimal (<50cc) ?Depth of Debridement: subcutaneous tissue. ?Technique: Sharp excisional debridement to bleeding, viable  wound base.  ?Wound Progress: The wound appears to be stagnant and continues to do so. ?Site healing conversation 7 ?Dressing: Dry, sterile, compression dressing. ?Disposition: Patient tolerated procedure well. Patient to return in 1 week for follow-up. ? ?No follow-ups on file. ? ?  ?

## 2021-07-30 ENCOUNTER — Ambulatory Visit (HOSPITAL_COMMUNITY)
Admission: RE | Admit: 2021-07-30 | Discharge: 2021-07-30 | Disposition: A | Discharge status: Home / Self Care | Payer: Medicare Other | Source: Ambulatory Visit | Attending: Podiatry | Admitting: Podiatry

## 2021-07-30 ENCOUNTER — Other Ambulatory Visit: Payer: Self-pay

## 2021-07-30 DIAGNOSIS — I999 Unspecified disorder of circulatory system: Secondary | ICD-10-CM

## 2021-08-01 ENCOUNTER — Ambulatory Visit
Admission: RE | Admit: 2021-08-01 | Discharge: 2021-08-01 | Disposition: A | Discharge status: Home / Self Care | Payer: Medicare Other | Source: Ambulatory Visit | Attending: Orthopaedic Surgery | Admitting: Orthopaedic Surgery

## 2021-08-01 DIAGNOSIS — M545 Low back pain, unspecified: Secondary | ICD-10-CM

## 2021-08-01 DIAGNOSIS — Z0389 Encounter for observation for other suspected diseases and conditions ruled out: Secondary | ICD-10-CM | POA: Diagnosis not present

## 2021-08-01 DIAGNOSIS — H401122 Primary open-angle glaucoma, left eye, moderate stage: Secondary | ICD-10-CM | POA: Diagnosis not present

## 2021-08-01 MED ORDER — IOPAMIDOL (ISOVUE-M 200) INJECTION 41%
1.0000 mL | Freq: Once | INTRAMUSCULAR | Status: AC
  Administered 2021-08-01: 1 mL via EPIDURAL

## 2021-08-01 MED ORDER — METHYLPREDNISOLONE ACETATE 40 MG/ML INJ SUSP (RADIOLOG
80.0000 mg | Freq: Once | INTRAMUSCULAR | Status: AC
  Administered 2021-08-01: 80 mg via EPIDURAL

## 2021-08-01 NOTE — Discharge Instructions (Signed)

## 2021-08-08 DIAGNOSIS — M5451 Vertebrogenic low back pain: Secondary | ICD-10-CM | POA: Diagnosis not present

## 2021-08-19 DIAGNOSIS — Z9081 Acquired absence of spleen: Secondary | ICD-10-CM | POA: Diagnosis not present

## 2021-08-19 DIAGNOSIS — R600 Localized edema: Secondary | ICD-10-CM | POA: Diagnosis not present

## 2021-08-19 DIAGNOSIS — I878 Other specified disorders of veins: Secondary | ICD-10-CM | POA: Diagnosis not present

## 2021-08-19 DIAGNOSIS — L0390 Cellulitis, unspecified: Secondary | ICD-10-CM | POA: Diagnosis not present

## 2021-08-20 DIAGNOSIS — I10 Essential (primary) hypertension: Secondary | ICD-10-CM | POA: Diagnosis not present

## 2021-08-20 DIAGNOSIS — G4733 Obstructive sleep apnea (adult) (pediatric): Secondary | ICD-10-CM | POA: Diagnosis not present

## 2021-08-20 DIAGNOSIS — I82409 Acute embolism and thrombosis of unspecified deep veins of unspecified lower extremity: Secondary | ICD-10-CM | POA: Diagnosis not present

## 2021-08-20 DIAGNOSIS — Z8579 Personal history of other malignant neoplasms of lymphoid, hematopoietic and related tissues: Secondary | ICD-10-CM | POA: Diagnosis not present

## 2021-08-20 DIAGNOSIS — E559 Vitamin D deficiency, unspecified: Secondary | ICD-10-CM | POA: Diagnosis not present

## 2021-08-20 DIAGNOSIS — L03119 Cellulitis of unspecified part of limb: Secondary | ICD-10-CM | POA: Diagnosis not present

## 2021-08-20 DIAGNOSIS — Z6841 Body Mass Index (BMI) 40.0 and over, adult: Secondary | ICD-10-CM | POA: Diagnosis not present

## 2021-08-20 DIAGNOSIS — M545 Low back pain, unspecified: Secondary | ICD-10-CM | POA: Diagnosis not present

## 2021-08-20 DIAGNOSIS — G250 Essential tremor: Secondary | ICD-10-CM | POA: Diagnosis not present

## 2021-08-20 DIAGNOSIS — Z0000 Encounter for general adult medical examination without abnormal findings: Secondary | ICD-10-CM | POA: Diagnosis not present

## 2021-08-20 DIAGNOSIS — N529 Male erectile dysfunction, unspecified: Secondary | ICD-10-CM | POA: Diagnosis not present

## 2021-08-20 DIAGNOSIS — F419 Anxiety disorder, unspecified: Secondary | ICD-10-CM | POA: Diagnosis not present

## 2021-08-20 DIAGNOSIS — F418 Other specified anxiety disorders: Secondary | ICD-10-CM | POA: Diagnosis not present

## 2021-08-20 DIAGNOSIS — Z125 Encounter for screening for malignant neoplasm of prostate: Secondary | ICD-10-CM | POA: Diagnosis not present

## 2021-08-20 DIAGNOSIS — G629 Polyneuropathy, unspecified: Secondary | ICD-10-CM | POA: Diagnosis not present

## 2021-08-20 DIAGNOSIS — R3129 Other microscopic hematuria: Secondary | ICD-10-CM | POA: Diagnosis not present

## 2021-08-21 DIAGNOSIS — M545 Low back pain, unspecified: Secondary | ICD-10-CM | POA: Diagnosis not present

## 2021-08-22 ENCOUNTER — Telehealth: Payer: Self-pay | Admitting: Podiatry

## 2021-08-22 DIAGNOSIS — L97522 Non-pressure chronic ulcer of other part of left foot with fat layer exposed: Secondary | ICD-10-CM

## 2021-08-22 NOTE — Telephone Encounter (Signed)
Pt scheduled an appt on line thru my chart but scheduled as a new pt. He is not a new pt and states he needs a graft and will only be in [place] 4.26 or in the am on 4.27. You are full on Wednesday and in [place] on Thursday. What should we do with this pt? ?

## 2021-08-22 NOTE — Telephone Encounter (Signed)
Left message for pt that the appt time has changed to 1130 and to call to confirm... ?

## 2021-08-23 ENCOUNTER — Telehealth: Payer: Self-pay | Admitting: Podiatry

## 2021-08-23 NOTE — Telephone Encounter (Signed)
Patient would like a call back from Dr Posey Pronto  about his upcoming procedure.

## 2021-08-26 ENCOUNTER — Other Ambulatory Visit: Payer: Self-pay | Admitting: Podiatry

## 2021-08-26 MED ORDER — SANTYL 250 UNIT/GM EX OINT: 1.0000 "application " | TOPICAL_OINTMENT | Freq: Every day | CUTANEOUS | 0 refills | Status: DC

## 2021-08-28 ENCOUNTER — Inpatient Hospital Stay (HOSPITAL_COMMUNITY): Payer: Medicare Other

## 2021-08-28 ENCOUNTER — Emergency Department (HOSPITAL_COMMUNITY): Payer: Medicare Other

## 2021-08-28 ENCOUNTER — Ambulatory Visit (INDEPENDENT_AMBULATORY_CARE_PROVIDER_SITE_OTHER): Payer: Medicare Other | Admitting: Podiatry

## 2021-08-28 ENCOUNTER — Ambulatory Visit (INDEPENDENT_AMBULATORY_CARE_PROVIDER_SITE_OTHER): Payer: Medicare Other

## 2021-08-28 ENCOUNTER — Other Ambulatory Visit: Payer: Self-pay

## 2021-08-28 ENCOUNTER — Encounter (HOSPITAL_COMMUNITY): Payer: Self-pay

## 2021-08-28 ENCOUNTER — Inpatient Hospital Stay (HOSPITAL_COMMUNITY)
Admission: EM | Admit: 2021-08-28 | Discharge: 2021-08-30 | DRG: 603 | Disposition: A | Discharge status: Home / Self Care | Payer: Medicare Other | Attending: Family Medicine | Admitting: Family Medicine

## 2021-08-28 ENCOUNTER — Ambulatory Visit: Payer: Medicare Other | Admitting: Podiatry

## 2021-08-28 DIAGNOSIS — Z79899 Other long term (current) drug therapy: Secondary | ICD-10-CM

## 2021-08-28 DIAGNOSIS — Z96652 Presence of left artificial knee joint: Secondary | ICD-10-CM | POA: Diagnosis present

## 2021-08-28 DIAGNOSIS — Z791 Long term (current) use of non-steroidal anti-inflammatories (NSAID): Secondary | ICD-10-CM

## 2021-08-28 DIAGNOSIS — Z8249 Family history of ischemic heart disease and other diseases of the circulatory system: Secondary | ICD-10-CM

## 2021-08-28 DIAGNOSIS — Z95828 Presence of other vascular implants and grafts: Secondary | ICD-10-CM | POA: Diagnosis not present

## 2021-08-28 DIAGNOSIS — G4733 Obstructive sleep apnea (adult) (pediatric): Secondary | ICD-10-CM | POA: Diagnosis present

## 2021-08-28 DIAGNOSIS — Z86711 Personal history of pulmonary embolism: Secondary | ICD-10-CM | POA: Diagnosis not present

## 2021-08-28 DIAGNOSIS — L03032 Cellulitis of left toe: Secondary | ICD-10-CM | POA: Diagnosis present

## 2021-08-28 DIAGNOSIS — L089 Local infection of the skin and subcutaneous tissue, unspecified: Principal | ICD-10-CM

## 2021-08-28 DIAGNOSIS — M7989 Other specified soft tissue disorders: Secondary | ICD-10-CM | POA: Diagnosis not present

## 2021-08-28 DIAGNOSIS — Z9081 Acquired absence of spleen: Secondary | ICD-10-CM | POA: Diagnosis not present

## 2021-08-28 DIAGNOSIS — Z885 Allergy status to narcotic agent status: Secondary | ICD-10-CM | POA: Diagnosis not present

## 2021-08-28 DIAGNOSIS — Z86718 Personal history of other venous thrombosis and embolism: Secondary | ICD-10-CM | POA: Diagnosis not present

## 2021-08-28 DIAGNOSIS — Z8572 Personal history of non-Hodgkin lymphomas: Secondary | ICD-10-CM

## 2021-08-28 DIAGNOSIS — Z6841 Body Mass Index (BMI) 40.0 and over, adult: Secondary | ICD-10-CM | POA: Diagnosis not present

## 2021-08-28 DIAGNOSIS — I10 Essential (primary) hypertension: Secondary | ICD-10-CM | POA: Diagnosis present

## 2021-08-28 DIAGNOSIS — L97522 Non-pressure chronic ulcer of other part of left foot with fat layer exposed: Secondary | ICD-10-CM | POA: Diagnosis not present

## 2021-08-28 DIAGNOSIS — L03116 Cellulitis of left lower limb: Principal | ICD-10-CM | POA: Diagnosis present

## 2021-08-28 DIAGNOSIS — K219 Gastro-esophageal reflux disease without esophagitis: Secondary | ICD-10-CM | POA: Diagnosis present

## 2021-08-28 DIAGNOSIS — L97529 Non-pressure chronic ulcer of other part of left foot with unspecified severity: Secondary | ICD-10-CM | POA: Diagnosis not present

## 2021-08-28 DIAGNOSIS — L03031 Cellulitis of right toe: Secondary | ICD-10-CM | POA: Diagnosis not present

## 2021-08-28 DIAGNOSIS — Z01818 Encounter for other preprocedural examination: Secondary | ICD-10-CM | POA: Diagnosis not present

## 2021-08-28 DIAGNOSIS — R9431 Abnormal electrocardiogram [ECG] [EKG]: Secondary | ICD-10-CM | POA: Diagnosis not present

## 2021-08-28 DIAGNOSIS — Z87891 Personal history of nicotine dependence: Secondary | ICD-10-CM | POA: Diagnosis not present

## 2021-08-28 DIAGNOSIS — Z7901 Long term (current) use of anticoagulants: Secondary | ICD-10-CM

## 2021-08-28 DIAGNOSIS — L0390 Cellulitis, unspecified: Secondary | ICD-10-CM | POA: Diagnosis not present

## 2021-08-28 DIAGNOSIS — S91102A Unspecified open wound of left great toe without damage to nail, initial encounter: Secondary | ICD-10-CM | POA: Diagnosis not present

## 2021-08-28 LAB — CBC WITH DIFFERENTIAL/PLATELET
Abs Immature Granulocytes: 0.03 10*3/uL (ref 0.00–0.07)
Basophils Absolute: 0.1 10*3/uL (ref 0.0–0.1)
Basophils Relative: 1 %
Eosinophils Absolute: 0.2 10*3/uL (ref 0.0–0.5)
Eosinophils Relative: 2 %
HCT: 42.4 % (ref 39.0–52.0)
Hemoglobin: 13.7 g/dL (ref 13.0–17.0)
Immature Granulocytes: 0 %
Lymphocytes Relative: 11 %
Lymphs Abs: 0.9 10*3/uL (ref 0.7–4.0)
MCH: 31.1 pg (ref 26.0–34.0)
MCHC: 32.3 g/dL (ref 30.0–36.0)
MCV: 96.1 fL (ref 80.0–100.0)
Monocytes Absolute: 1.1 10*3/uL — ABNORMAL HIGH (ref 0.1–1.0)
Monocytes Relative: 14 %
Neutro Abs: 6 10*3/uL (ref 1.7–7.7)
Neutrophils Relative %: 72 %
Platelets: 323 10*3/uL (ref 150–400)
RBC: 4.41 MIL/uL (ref 4.22–5.81)
RDW: 14.9 % (ref 11.5–15.5)
WBC: 8.3 10*3/uL (ref 4.0–10.5)
nRBC: 0 % (ref 0.0–0.2)

## 2021-08-28 LAB — COMPREHENSIVE METABOLIC PANEL
ALT: 20 U/L (ref 0–44)
AST: 21 U/L (ref 15–41)
Albumin: 3.6 g/dL (ref 3.5–5.0)
Alkaline Phosphatase: 64 U/L (ref 38–126)
Anion gap: 7 (ref 5–15)
BUN: 14 mg/dL (ref 8–23)
CO2: 26 mmol/L (ref 22–32)
Calcium: 9.1 mg/dL (ref 8.9–10.3)
Chloride: 105 mmol/L (ref 98–111)
Creatinine, Ser: 0.89 mg/dL (ref 0.61–1.24)
GFR, Estimated: >60 mL/min (ref >60)
Glucose, Bld: 108 mg/dL — ABNORMAL HIGH (ref 70–99)
Potassium: 4.8 mmol/L (ref 3.5–5.1)
Sodium: 138 mmol/L (ref 135–145)
Total Bilirubin: 0.7 mg/dL (ref 0.3–1.2)
Total Protein: 6.9 g/dL (ref 6.5–8.1)

## 2021-08-28 LAB — LACTIC ACID, PLASMA: Lactic Acid, Venous: 1.1 mmol/L (ref 0.5–1.9)

## 2021-08-28 MED ORDER — FENTANYL CITRATE PF 50 MCG/ML IJ SOSY: 12.5000 ug | PREFILLED_SYRINGE | INTRAMUSCULAR | Status: DC | PRN

## 2021-08-28 MED ORDER — ACETAMINOPHEN 325 MG PO TABS
650.0000 mg | ORAL_TABLET | Freq: Four times a day (QID) | ORAL | Status: DC | PRN
  Administered 2021-08-29: 650 mg via ORAL
  Filled 2021-08-28: qty 2

## 2021-08-28 MED ORDER — ACETAMINOPHEN 650 MG RE SUPP: 650.0000 mg | Freq: Four times a day (QID) | RECTAL | Status: DC | PRN

## 2021-08-28 MED ORDER — GADOBUTROL 1 MMOL/ML IV SOLN
10.0000 mL | Freq: Once | INTRAVENOUS | Status: AC | PRN
  Administered 2021-08-28: 10 mL via INTRAVENOUS

## 2021-08-28 MED ORDER — VANCOMYCIN HCL 1750 MG/350ML IV SOLN
1750.0000 mg | Freq: Two times a day (BID) | INTRAVENOUS | Status: DC
Start: 2021-08-29 — End: 2021-08-29
  Administered 2021-08-29: 1750 mg via INTRAVENOUS
  Filled 2021-08-28: qty 350

## 2021-08-28 MED ORDER — SODIUM CHLORIDE 0.9 % IV SOLN: INTRAVENOUS | Status: DC

## 2021-08-28 MED ORDER — VANCOMYCIN HCL 10 G IV SOLR
2500.0000 mg | Freq: Once | INTRAVENOUS | Status: AC
  Administered 2021-08-28: 2500 mg via INTRAVENOUS
  Filled 2021-08-28: qty 2500

## 2021-08-28 MED ORDER — SODIUM CHLORIDE 0.9 % IV SOLN
2.0000 g | INTRAVENOUS | Status: DC
  Administered 2021-08-28: 2 g via INTRAVENOUS
  Filled 2021-08-28: qty 20

## 2021-08-28 MED ORDER — IOHEXOL 300 MG/ML  SOLN
75.0000 mL | Freq: Once | INTRAMUSCULAR | Status: AC | PRN
  Administered 2021-08-28: 75 mL via INTRAVENOUS

## 2021-08-28 MED ORDER — METRONIDAZOLE 500 MG/100ML IV SOLN
500.0000 mg | Freq: Two times a day (BID) | INTRAVENOUS | Status: DC
  Administered 2021-08-28 – 2021-08-29 (×2): 500 mg via INTRAVENOUS
  Filled 2021-08-28 (×2): qty 100

## 2021-08-28 MED ORDER — FAMOTIDINE 20 MG PO TABS
20.0000 mg | ORAL_TABLET | Freq: Every evening | ORAL | Status: DC
  Administered 2021-08-28 – 2021-08-29 (×2): 20 mg via ORAL
  Filled 2021-08-28 (×2): qty 1

## 2021-08-28 MED ORDER — LORATADINE 10 MG PO TABS
10.0000 mg | ORAL_TABLET | Freq: Every day | ORAL | Status: DC
Start: 2021-08-29 — End: 2021-08-30
  Filled 2021-08-28 (×2): qty 1

## 2021-08-28 MED ORDER — SODIUM CHLORIDE 0.9 % IV SOLN: 75.0000 mL/h | INTRAVENOUS | Status: DC

## 2021-08-28 NOTE — ED Provider Notes (Signed)
?[facility] ?Provider Note ? ? ?CSN: [id number] ?Arrival date & time: 08/28/21  1400 ? ?  ?History ? ?Chief Complaint  ?Patient presents with  ? Toe Pain  ? ? ?Marc Mills is a 76 y.o. male here for evaluation of left great toe wound.  Followed by Dr. Posey Pronto with right foot and ankle.  Has been on Keflex (chronic suppression) land doxycycline over the last 2 weeks.  Previous on Linezolid. Wound which has been present for 2 months has been progressively worsening.  Patient states initially started out as "cellulitis."  Seen by podiatry today, concern for wound which probes to bone.  They recommended admission for IV antibiotics, MRI, likely amputation.  Further note they recommend surgery on Friday, n.p.o. on Thursday.  No fever, emesis, chest pain, abdominal pain, numbness, weakness.  Has chronic lower extremity swelling.  No history of diabetes.  No peripheral neuropathy per patient.  No recent injury or trauma. ? ?Followed by ID at [facility] for recurrent cellulitis ? ?Chronically anticoagulated for recurrent PE on Xarelto.  Has IVC filter in place.  ? ?PCP-- Sadie Haber ? ?HPI ? ?  ? ?Home Medications ?Prior to Admission medications   ?Medication Sig Start Date End Date Taking? Authorizing Provider  ?acetaminophen (TYLENOL) 500 MG tablet Take 1,000 mg by mouth daily.   Yes [provider]  ?Acetylcysteine (NAC) 500 MG CAPS Take 500 mg by mouth daily.   Yes [provider]  ?Ascorbic Acid (VITAMIN C) 500 MG CAPS Take 500 mg by mouth daily.   Yes [provider]  ?B Complex Vitamins (VITAMIN B COMPLEX) TABS Take 1 tablet by mouth daily.   Yes [provider]  ?celecoxib (CELEBREX) 200 MG capsule Take 200 mg by mouth every other day. 08/08/21  Yes [provider]  ?cephALEXin (KEFLEX) 500 MG capsule Take 500 mg by mouth 3 (three) times daily. contineuos   Yes [provider]  ?cetirizine (ZYRTEC) 5 MG tablet Take 1 tablet by mouth  daily as needed for allergies. 11/24/20 11/24/21 Yes [provider]  ?Cholecalciferol (VITAMIN D3 ULTRA STRENGTH) 125 MCG (5000 UT) capsule Take 5,000 Units by mouth daily.   Yes [provider]  ?Coenzyme Q10 (COQ-10) 30 MG CAPS Take 1 capsule by mouth daily.   Yes [provider]  ?cyclobenzaprine (FLEXERIL) 10 MG tablet Take 10 mg by mouth 3 (three) times daily as needed for muscle spasms.   Yes [provider]  ?diclofenac Sodium (VOLTAREN) 1 % GEL 2 g daily as needed (pain). 05/22/21  Yes [provider]  ?doxycycline (VIBRAMYCIN) 100 MG capsule Take 100 mg by mouth 2 (two) times daily. 07/04/21  Yes [provider]  ?famotidine (PEPCID) 20 MG tablet Take 20 mg by mouth every evening. 08/08/21  Yes [provider]  ?Ginger, Zingiber officinalis, (GINGER EXTRACT) 250 MG CAPS Take 1 capsule by mouth daily.   Yes [provider]  ?Multiple Vitamin (MULTIVITAMIN ADULT) TABS Take 1 tablet by mouth daily.   Yes [provider]  ?Multiple Vitamins-Minerals (ONCOVITE) TABS Take 1 tablet by mouth daily.   Yes [provider]  ?Nutritional Supplements (SALMON OIL) CAPS Take 1 capsule by mouth daily.   Yes [provider]  ?Quercetin 50 MG TABS Take 50 mg by mouth daily.   Yes [provider]  ?rivaroxaban (XARELTO) 20 MG TABS tablet Take 20 mg by mouth daily. 08/06/20  Yes [provider]  ?sildenafil (REVATIO) 20 MG  tablet Take 20 mg by mouth daily as needed (erectile dysfunction).   Yes [provider]  ?valsartan (DIOVAN) 80 MG tablet Take 80 mg by mouth every evening. 08/20/21  Yes [provider]  ?vitamin E 180 MG (400 UNITS) capsule Take 400 Units by mouth daily.   Yes [provider]  ?Zinc 30 MG TABS Take 30 mg by mouth daily.   Yes [provider]  ?amoxicillin-clavulanate (AUGMENTIN) 875-125 MG tablet SMARTSIG:1 Tablet(s) By Mouth Every 12 Hours ?Patient not taking:  Reported on 08/28/2021 12/09/20   [provider]  ?Bartolo Darter COVID-19 AG HOME TEST KIT TEST AS DIRECTED TODAY 01/19/21   [provider]  ?collagenase (SANTYL) 250 UNIT/GM ointment Apply 1 application. topically daily. ?Patient not taking: Reported on 08/28/2021 07/24/21   Felipa Furnace, DPM  ?collagenase (SANTYL) 250 UNIT/GM ointment Apply 1 application. topically daily. ?Patient not taking: Reported on 08/28/2021 08/26/21   Felipa Furnace, DPM  ?[facility] injection  05/28/20   [provider]  ?TETANUS-DIPHTHERIA TOXOIDS TD IM Inject into the muscle.    [provider]  ?traMADol (ULTRAM) 50 MG tablet Take 1 tablet (50 mg total) by mouth every 12 (twelve) hours as needed. ?Patient not taking: Reported on 08/28/2021 06/27/21   Garald Balding, MD  ?traMADol HCl 100 MG TABS  09/17/20   [provider]  ?VIAGRA 100 MG tablet TAKE 1 TABLET BY MOUTH EVERY DAY AS NEEDED FOR ERECTILE DYSFUNCTION . SPLIT TABLET IN HALF AS NEEDED ?Patient not taking: Reported on 08/28/2021 07/15/15   Harrison Mons, [place]  ?   ? ?Allergies    ?Hydrocodone, Hydrocodone-guaifenesin, Hydromorphone hcl, and Oxycodone   ? ?Review of Systems   ?Review of Systems  ?Constitutional: Negative.   ?HENT: Negative.    ?Respiratory: Negative.    ?Cardiovascular: Negative.   ?Gastrointestinal: Negative.   ?Genitourinary: Negative.   ?Musculoskeletal:   ?     Left great toe  ?Skin:  Positive for wound.  ?Neurological: Negative.   ?All other systems reviewed and are negative. ? ?Physical Exam ?Updated Vital Signs ?BP (!) 161/111   Pulse 72   Temp 98.1 ?F (36.7 ?C) (Oral)   Resp 16   Ht '5\' 11"'  (1.803 m)   Wt (!) 149.7 kg   SpO2 100%   BMI 46.03 kg/m?  ?Physical Exam ?Vitals and nursing note reviewed.  ?Constitutional:   ?   General: He is not in acute distress. ?   Appearance: He is well-developed. He is not ill-appearing, toxic-appearing or diaphoretic.  ?HENT:  ?   Head: Normocephalic and atraumatic.  ?   Nose: Nose  normal.  ?   Mouth/Throat:  ?   Mouth: Mucous membranes are moist.  ?Eyes:  ?   Pupils: Pupils are equal, round, and reactive to light.  ?Cardiovascular:  ?   Rate and Rhythm: Normal rate and regular rhythm.  ?   Pulses:     ?     Radial pulses are 2+ on the right side and 2+ on the left side.  ?     Dorsalis pedis pulses are 1+ on the right side and 1+ on the left side.  ?   Heart sounds: Normal heart sounds.  ?Pulmonary:  ?   Effort: Pulmonary effort is normal. No respiratory distress.  ?   Breath sounds: Normal breath sounds.  ?Abdominal:  ?   General: Bowel sounds are normal. There is no distension.  ?   Palpations: Abdomen is  soft.  ?Musculoskeletal:     ?   General: Normal range of motion.  ?   Cervical back: Normal range of motion and neck supple.  ?     Feet: ? ?   Comments: No boney tenderness, full range of motion  ?Feet:  ?   Comments: Chronic venous stasis skin changes bilateral lower extremities, bil LE edema ?Skin: ?   General: Skin is warm and dry.  ?   Capillary Refill: Capillary refill takes less than 2 seconds.  ?   Comments: Left great toe wound, see picture in chart.  Probes to bone, surrounding skin breakdown, mild erythema  ?Neurological:  ?   General: No focal deficit present.  ?   Mental Status: He is alert and oriented to person, place, and time.  ?   Sensory: Sensation is intact.  ?   Motor: Motor function is intact.  ?   Gait: Gait is intact.  ?   Comments: Ambulatory ?Intact sensation ?Equal strength  ? ? ?ED Results / Procedures / Treatments   ?Labs ?(all labs ordered are listed, but only abnormal results are displayed) ?Labs Reviewed  ?COMPREHENSIVE METABOLIC PANEL - Abnormal; Notable for the following components:  ?    Result Value  ? Glucose, Bld 108 (*)   ? All other components within normal limits  ?CBC WITH DIFFERENTIAL/PLATELET - Abnormal; Notable for the following components:  ? Monocytes Absolute 1.1 (*)   ? All other components within normal limits  ?CULTURE, BLOOD (ROUTINE X  2)  ?CULTURE, BLOOD (ROUTINE X 2)  ?LACTIC ACID, PLASMA  ? ? ?EKG ?None ? ?Radiology ?CT TIBIA FIBULA LEFT W CONTRAST ? ?Result Date: 08/28/2021 ?CLINICAL DATA:  Foot swelling, nondiabetic, osteomyelitis suspected

## 2021-08-28 NOTE — Assessment & Plan Note (Signed)
Hold diovan for tonight ? ?

## 2021-08-28 NOTE — Assessment & Plan Note (Signed)
Hold xarelto for now given need for opeartive intervention pt sp ivc filter but if needs to be held for prolonged time can do bridge with haparin ?

## 2021-08-28 NOTE — ED Provider Triage Note (Signed)
Emergency Medicine Provider Triage Evaluation Note ? ?Marc Mills , a 76 y.o. male  was evaluated in triage.  Pt complains of wound to left great toe.  Wound has been there for the last 2 months that has been getting progressively worse.  Patient reports that he was on antibiotics recently for cellulitis. ? ?Denies any fever, chills, numbness, weakness. ? ?Review of Systems  ?Positive: Wound to left great toe ?Negative: See above ? ?Physical Exam  ?BP (!) 152/91 (BP Location: Right Arm)   Pulse 72   Temp 98.1 ?F (36.7 ?C) (Oral)   Resp 16   SpO2 97%  ?Gen:   Awake, no distress   ?Resp:  Normal effort  ?MSK:   Moves extremities without difficulty  ?Other:   ? ?Medical Decision Making  ?Medically screening exam initiated at 2:15 PM.  Appropriate orders placed.  Marc Mills was informed that the remainder of the evaluation will be completed by another provider, this initial triage assessment does not replace that evaluation, and the importance of remaining in the ED until their evaluation is complete. ? ?Per chart review patient was seen by podiatrist Dr. Posey Pronto earlier today.  Dr. Serita Grit note states "Clinically the wound has regressed at this point I discussed with the patient will benefit from admission into the hospital for IV antibiotics and an MRI evaluation.  Patient will need a surgical amputation I will plan on doing this on Friday afternoon ?-N.p.o. after midnight on Thursday.  -Weightbearing as tolerated with surgical shoe" ?  ?Loni Beckwith, PA-C ?08/28/21 1417 ? ?

## 2021-08-28 NOTE — Subjective & Objective (Signed)
Pt with left hallux ulceration tht has progressed probbing down to the bone with some redness ?He was was following by wound care already on antibiotics coninued to have redness ?On Keflex linelozid, ?Tried on Doxycline ?Npo post midnight CT not showing nec fascitis ? Dr. Posey Pronto with podiatry wrote in his note ?-Patient will need a surgical amputation I will plan on doing this on Friday afternoon ?-N.p.o. after midnight on Thursday. ?-Weightbearing as tolerated with surgical shoe ?-ABIs PVRs were reviewed: Patient has adequate flow to the left lower extremity ?-Patient is a high risk of losing the toe the foot versus the leg. ? ? ?

## 2021-08-28 NOTE — Progress Notes (Signed)
?Subjective:  ?Patient ID: Marc Mills, male    DOB: 12/28/1945,  MRN: [id number] ? ?Chief Complaint  ?Patient presents with  ? Wound Check  ?  Left hallux  ulcer   ? ? ?76 y.o. male presents for wound care.  Patient presents with follow-up of left hallux ulceration that has regressed and is probing down to bone there is some redness associate with it.  He states is looking a little bit better.  He would like to get further evaluated.  He denies any other acute complaints. ? ?Review of Systems: Negative except as noted in the HPI. Denies N/V/F/Ch. ? ?Past Medical History:  ?Diagnosis Date  ? Anorectal fistula   ? Arthritis   ? Blood transfusion without reported diagnosis   ? Cancer [facility])   ? lymphoma hx  ? Cataract   ? Cellulitis 04/04/2013  ? DVT (deep venous thrombosis) ([place])   ? 2011 right leg  ? History of lymphoma   ? History of pulmonary embolus (PE)   ? Incisional hernia   ? Morbid obesity ([place])   ? OSA on CPAP   ? cpap  10 yrs  ? Varicose veins of lower extremities   ? pe  ? ? ?Current Outpatient Medications:  ?  acetaminophen (TYLENOL) 500 MG tablet, Take 1,000 mg by mouth every 6 (six) hours as needed for mild pain., Disp: , Rfl:  ?  Acetylcysteine (NAC) 500 MG CAPS, 1 capsule, Disp: , Rfl:  ?  amoxicillin (AMOXIL) 500 MG capsule, SMARTSIG:4 Capsule(s) By Mouth, Disp: , Rfl:  ?  amoxicillin-clavulanate (AUGMENTIN) 875-125 MG tablet, SMARTSIG:1 Tablet(s) By Mouth Every 12 Hours, Disp: , Rfl:  ?  Ascorbic Acid (VITAMIN C) 500 MG CAPS, 1 tablet, Disp: , Rfl:  ?  B Complex Vitamins (VITAMIN B COMPLEX) TABS, 1 tablet, Disp: , Rfl:  ?  BINAXNOW COVID-19 AG HOME TEST KIT, TEST AS DIRECTED TODAY, Disp: , Rfl:  ?  calcium carbonate (OS-CAL) 600 MG TABS, Take 600 mg by mouth every morning. , Disp: , Rfl:  ?  cephALEXin (KEFLEX) 500 MG capsule, 1 capsule, Disp: , Rfl:  ?  cetirizine (ZYRTEC) 5 MG tablet, Take 1 tablet by mouth daily., Disp: , Rfl:  ?  cholecalciferol (VITAMIN D) 1000 UNITS tablet, Take 1,000  Units by mouth every morning. , Disp: , Rfl:  ?  Cholecalciferol (VITAMIN D3 ULTRA STRENGTH) 125 MCG (5000 UT) capsule, 1 capsule, Disp: , Rfl:  ?  Coenzyme Q10 (COQ-10) 30 MG CAPS, 1 capsule with a meal, Disp: , Rfl:  ?  collagenase (SANTYL) 250 UNIT/GM ointment, Apply 1 application. topically daily., Disp: 15 g, Rfl: 0 ?  collagenase (SANTYL) 250 UNIT/GM ointment, Apply 1 application. topically daily., Disp: 15 g, Rfl: 0 ?  diclofenac Sodium (VOLTAREN) 1 % GEL, One gram, Disp: , Rfl:  ?  Ginger, Zingiber officinalis, (GINGER EXTRACT) 250 MG CAPS, 1 capsule, Disp: , Rfl:  ?  LORazepam (ATIVAN) 0.5 MG tablet, TAKE 1 TO 2 TABLETS BY MOUTH AS NEEDED EVERY 12 HOURS, Disp: , Rfl: 1 ?  LORazepam (ATIVAN) 0.5 MG tablet, 1-2 tablets as needed, Disp: , Rfl:  ?  Magnesium 400 MG CAPS, Take 400 mg by mouth daily. , Disp: , Rfl:  ?  Multiple Vitamin (MULTIVITAMIN ADULT) TABS, 1 tablet, Disp: , Rfl:  ?  Multiple Vitamins-Minerals (ONCOVITE) TABS, Take by mouth., Disp: , Rfl:  ?  neomycin-polymyxin b-dexamethasone (MAXITROL) 3.5-10000-0.1 SUSP, Place into the left eye., Disp: , Rfl:  ?  Nutritional Supplements (SALMON OIL) CAPS, Take 1 capsule by mouth daily., Disp: , Rfl:  ?  prednisoLONE acetate (PRED FORTE) 1 % ophthalmic suspension, Place into the left eye., Disp: , Rfl:  ?  Quercetin 50 MG TABS, 1 tablet, Disp: , Rfl:  ?  rivaroxaban (XARELTO) 20 MG TABS tablet, Take 1 tablet by mouth daily., Disp: , Rfl:  ?  SHINGRIX injection, , Disp: , Rfl:  ?  sildenafil (REVATIO) 20 MG tablet, 3-5 tablets, Disp: , Rfl:  ?  TETANUS-DIPHTHERIA TOXOIDS TD IM, Inject into the muscle., Disp: , Rfl:  ?  traMADol (ULTRAM) 50 MG tablet, Take 1 tablet (50 mg total) by mouth every 12 (twelve) hours as needed., Disp: 30 tablet, Rfl: 0 ?  traMADol HCl 100 MG TABS, , Disp: , Rfl:  ?  VIAGRA 100 MG tablet, TAKE 1 TABLET BY MOUTH EVERY DAY AS NEEDED FOR ERECTILE DYSFUNCTION . SPLIT TABLET IN HALF AS NEEDED, Disp: 10 tablet, Rfl: 0 ?  vitamin E 180 MG  (400 UNITS) capsule, 1 capsule, Disp: , Rfl:  ?  Zinc 30 MG TABS, 1 tablet, Disp: , Rfl:  ? ?Social History  ? ?Tobacco Use  ?Smoking Status Former  ? Packs/day: 2.00  ? Years: 20.00  ? Pack years: 40.00  ? Types: Cigarettes  ? Quit date: 05/05/1984  ? Years since quitting: 37.3  ?Smokeless Tobacco Never  ? ? ?Allergies  ?Allergen Reactions  ? Hydrocodone Itching and Other (See Comments)  ?  Severe dizziness, Gives me chills  ? Hydrocodone-Guaifenesin Other (See Comments)  ?  Other reaction(s): chills and makes him feel bad  ? Hydromorphone Hcl Other (See Comments)  ? Oxycodone Itching and Other (See Comments)  ?  Severe dizziness, Gives me chills  ? ?Objective:  ?There were no vitals filed for this visit. ?There is no height or weight on file to calculate BMI. ?Constitutional Well developed. ?Well nourished.  ?Vascular Dorsalis pedis pulses faint delay palpable bilaterally. ?Posterior tibial pulses none palpable bilaterally. ?Capillary refill diminished to all digits.  ?No cyanosis or clubbing noted. ?Pedal hair growth normal.  ?Neurologic Normal speech. ?Oriented to person, place, and time. ?Protective sensation absent  ?Dermatologic Wound Location: Left hallux ulceration now probing down to bone.  Does not probe down to deep tissue.  Signs of skin fissuring noted.  No malodor present.  Erythema noted circumferential around the toe ?Wound Base: Mixed Granular/Fibrotic ?Peri-wound: Calloused ?Exudate: Scant/small amount Serosanguinous exudate ?Wound Measurements: ?-See below  ?Orthopedic: No pain to palpation either foot.  ? ?Radiographs: Small pathologic fracture noted to the left hallux.  This could be consistent with osteomyelitis.  No gross abnormality noted. ? ? ? ?Assessment:  ? ?1. Skin ulcer of left great toe with fat layer exposed ([place])   ? ? ?Plan:  ?Patient was evaluated and treated and all questions answered. ? ?Ulcer left hallux fat layer exposed~stagnant ?-Clinically the wound has regressed at this  point I discussed with the patient will benefit from admission into the hospital for IV antibiotics and an MRI evaluation. ?-Patient will need a surgical amputation I will plan on doing this on Friday afternoon ?-N.p.o. after midnight on Thursday. ?-Weightbearing as tolerated with surgical shoe ?-ABIs PVRs were reviewed: Patient has adequate flow to the left lower extremity ?-Patient is a high risk of losing the toe the foot versus the leg. ? ?No follow-ups on file. ? ?  ?

## 2021-08-28 NOTE — H&P (Signed)
? ? ?Marc Mills FBP:[id number] DOB: 03-28-1946 DOA: 08/28/2021 ? ?  ?  ?PCP: Josetta Huddle, MD   ?Outpatient Specialists  ?  ?Podiatry Dr. Posey Pronto ?Patient arrived to ER on 08/28/21 at 1400 ?Referred by Attending Toy Baker, MD ? ? ?Patient coming from:   ? home Lives With family ?  ? ?Chief Complaint:   ?Chief Complaint  ?Patient presents with  ? Toe Pain  ? ? ?HPI: ?LIO Marc Mills is a 76 y.o. male with medical history significant of chornic left toe wound, hx of PE/DVT sp IVC filter, GERD, HTN   ?  ? ?Presented with   left great toe infection persistent despite p.o. antibiotics at home ?Pt with left hallux ulceration tht has progressed probbing down to the bone with some redness ?He was was following by wound care already on antibiotics coninued to have redness ?On Keflex linelozid, ?Tried on Doxycline ?Npo post midnight CT not showing nec fascitis ? Dr. Posey Pronto with podiatry wrote in his note ?-Patient will need a surgical amputation I will plan on doing this on Friday afternoon ?-N.p.o. after midnight on Thursday. ?-Weightbearing as tolerated with surgical shoe ?-ABIs PVRs were reviewed: Patient has adequate flow to the left lower extremity ?-Patient is a high risk of losing the toe the foot versus the leg. ? ? ?  in house  PCR testing  Pending ? ?No results found for: [id number] ?  ?Regarding pertinent Chronic problems:   ?  ? ? HTN on [place] ?  ? ?  Morbid obesity-   ?BMI Readings from Last 1 Encounters:  ?08/28/21 46.03 kg/m?  ? ?   OSA -on nocturnal CPAP,  ?  ? ? Hx of DVT/PE on - anticoagulation with Xarelto  ?  ? ?While in ER: ?  ?Noted to have plain imaging worrisome for free air CT of the leg did not confirm this , ER discussed at length with Dr. Posey Pronto  ? ? ? ?Ordered ?  ?Left foot  IMPRESSION: ?Superficial ulceration, subcutaneous gas, and extensive soft tissue ?swelling of the left great toe in keeping with superficial ?infection. No subjacent osseous erosion. ?  ?Diffuse soft tissue  swelling of the left mid and hindfoot with ?subcutaneous gas noted within the left hindfoot, not well assessed ?on this examination. Correlation for aggressive infection/fasciitis ?is recommended. ?  ?CXR -  NON acute ? ?CT foot/tibia - No acute bony abnormality. No evidence of osteomyelitis from the ?left knee through the left foot. ?  ?Soft tissue ulceration noted in the left great toe. Soft tissue ?swelling in the left great toe and left foot/ankle. ? ?Following Medications were ordered in ER: ?Medications  ?0.9 %  sodium chloride infusion ( Intravenous New Bag/Given 08/28/21 1618)  ?cefTRIAXone (ROCEPHIN) 2 g in sodium chloride 0.9 % 100 mL IVPB (0 g Intravenous Stopped 08/28/21 1718)  ?metroNIDAZOLE (FLAGYL) IVPB 500 mg (500 mg Intravenous New Bag/Given 08/28/21 1829)  ?vancomycin (VANCOREADY) IVPB 1750 mg/350 mL (has no administration in time range)  ?vancomycin (VANCOCIN) 2,500 mg in sodium chloride 0.9 % 500 mL IVPB (2,500 mg Intravenous New Bag/Given 08/28/21 1830)  ?iohexol (OMNIPAQUE) 300 MG/ML solution 75 mL (75 mLs Intravenous Contrast Given 08/28/21 1818)  ?gadobutrol (GADAVIST) 1 MMOL/ML injection 10 mL (10 mLs Intravenous Contrast Given 08/28/21 2111)  ?  ?_______________________________________________________ ?ER Provider Called:  Podiatry    Dr.Patel ?They Recommend admit to medicine   ?Will see in AM  ?  ?ED Triage Vitals  ?Enc Vitals Group  ?  BP 08/28/21 1411 (!) 152/91  ?   Pulse Rate 08/28/21 1411 72  ?   Resp 08/28/21 1411 16  ?   Temp 08/28/21 1411 98.1 ?F (36.7 ?C)  ?   Temp Source 08/28/21 1411 Oral  ?   SpO2 08/28/21 1411 97 %  ?   Weight 08/28/21 1414 (!) 330 lb (149.7 kg)  ?   Height 08/28/21 1414 '5\' 11"'$  (1.803 m)  ?   Head Circumference --   ?   Peak Flow --   ?   Pain Score 08/28/21 1414 2  ?   Pain Loc --   ?   Pain Edu? --   ?   Excl. in [place]? --   ?OIBB(04)@    ? _________________________________________ ?Significant initial  Findings: ?Abnormal Labs Reviewed  ?COMPREHENSIVE METABOLIC  PANEL - Abnormal; Notable for the following components:  ?    Result Value  ? Glucose, Bld 108 (*)   ? All other components within normal limits  ?CBC WITH DIFFERENTIAL/PLATELET - Abnormal; Notable for the following components:  ? Monocytes Absolute 1.1 (*)   ? All other components within normal limits  ? ?  ?___________  ?ECG: Ordered ?Personally reviewed by me showing: ?HR : 69 ?Rhythm: Sinus rhythm with 1st degree A-V block ?Left axis deviation ?Right bundle branch block ?Abnormal ECG ?QTC 411 ?  ? ?The recent clinical data is shown below. ?Vitals:  ? 08/28/21 1411 08/28/21 1414 08/28/21 1835  ?BP: (!) 152/91  (!) 161/111  ?Pulse: 72  72  ?Resp: 16  16  ?Temp: 98.1 ?F (36.7 ?C)    ?TempSrc: Oral    ?SpO2: 97%  100%  ?Weight:  (!) 149.7 kg   ?Height:  '5\' 11"'$  (1.803 m)   ? ?  ?WBC ? ?   ?Component Value Date/Time  ? WBC 8.3 08/28/2021 1428  ? LYMPHSABS 0.9 08/28/2021 1428  ? LYMPHSABS 1.0 09/12/2016 0954  ? MONOABS 1.1 (H) 08/28/2021 1428  ? MONOABS 0.9 09/12/2016 0954  ? EOSABS 0.2 08/28/2021 1428  ? EOSABS 0.4 09/12/2016 0954  ? BASOSABS 0.1 08/28/2021 1428  ? BASOSABS 0.1 09/12/2016 0954  ?  ? ?Lactic Acid, Venous ?   ?Component Value Date/Time  ? LATICACIDVEN 1.1 08/28/2021 1428  ?  ?Results for orders placed or performed during the hospital encounter of 08/31/15  ?Culture, blood (Routine X 2)     Status: None  ? Collection Time: 08/31/15  4:25 PM  ? Specimen: Right Antecubital; Blood  ?Result Value Ref Range Status  ? Specimen Description RIGHT ANTECUBITAL  Final  ? Special Requests BOTTLES DRAWN AEROBIC AND ANAEROBIC 5CC  Final  ? Culture   Final  ?  NO GROWTH 5 DAYS ?Performed at [facility] ?  ? Report Status 09/05/2015 FINAL  Final  ?Culture, blood (Routine X 2)     Status: None  ? Collection Time: 08/31/15  5:01 PM  ? Specimen: BLOOD  ?Result Value Ref Range Status  ? Specimen Description BLOOD BLOOD LEFT FOREARM  Final  ? Special Requests BOTTLES DRAWN AEROBIC AND ANAEROBIC 5 ML  Final  ? Culture    Final  ?  NO GROWTH 5 DAYS ?Performed at [facility] ?  ? Report Status 09/05/2015 FINAL  Final  ?Urine culture     Status: Abnormal  ? Collection Time: 08/31/15  6:27 PM  ? Specimen: Urine, Clean Catch  ?Result Value Ref Range Status  ? Specimen Description URINE, CLEAN CATCH  Final  ? Special Requests NONE  Final  ? Culture (A)  Final  ?  20,000 COLONIES/mL DIPHTHEROIDS(CORYNEBACTERIUM SPECIES) ?Standardized susceptibility testing for this organism is not available. ?Performed at [facility] ?  ? Report Status 09/02/2015 FINAL  Final  ?MRSA PCR Screening     Status: None  ? Collection Time: 08/31/15 10:20 PM  ? Specimen: Nasal Mucosa; Nasopharyngeal  ?Result Value Ref Range Status  ? MRSA by PCR NEGATIVE NEGATIVE Final  ?  Comment:        ?The GeneXpert MRSA Assay (FDA ?approved for NASAL specimens ?only), is one component of a ?comprehensive MRSA colonization ?surveillance program. It is not ?intended to diagnose MRSA ?infection nor to guide or ?monitor treatment for ?MRSA infections. ?  ? ? ? ?_______________________________________________ ?Hospitalist was called for admission for   Toe infection ?Cellulitis of left lower extremity   ?Chronic anticoagulation ?  ?   ?The following Work up has been ordered so far: ? ?Orders Placed This Encounter  ?Procedures  ? Blood culture (routine x 2)  ? MR FOOT LEFT W WO CONTRAST  ? DG Foot Complete Left  ? CT FOOT LEFT W CONTRAST  ? CT TIBIA FIBULA LEFT W CONTRAST  ? Comprehensive metabolic panel  ? CBC with Differential  ? Diet NPO time specified  ? Initiate Carrier Fluid Protocol  ? vancomycin per pharmacy consult  ? Consult to hospitalist  ? ED EKG  ? Admit to Inpatient (patient's expected length of stay will be greater than 2 midnights or inpatient only procedure)  ?  ? ?OTHER Significant initial  Findings: ? ?labs showing: ? ?  ?Recent Labs  ?Lab 08/28/21 ?1428  ?NA 138  ?K 4.8  ?CO2 26  ?GLUCOSE 108*  ?BUN 14  ?CREATININE 0.89  ?CALCIUM 9.1  ? ? ?Cr    stable,   ?Lab Results  ?Component Value Date  ? CREATININE 0.89 08/28/2021  ? CREATININE 0.8 09/12/2016  ? CREATININE 0.81 06/03/2016  ? ? ?Recent Labs  ?Lab 08/28/21 ?1428  ?AST 21  ?ALT 20  ?ALKPHOS

## 2021-08-28 NOTE — Assessment & Plan Note (Addendum)
-  admit per? cellulitis protocol will  ?     continue current antibiotic choice rocephin, vanc, metronidazole ?     plain films showed:  evidence of air  no evidence of osteomyelitis   no     foreign   Objects CT did not show any gas in the tissues ?     Will obtain MRSA screening,  ?   obtain blood cultures  ?    further antibiotic adjustment pending above results ?MRi negative for osteo defer to Podiatry re further treatment options ?

## 2021-08-28 NOTE — ED Triage Notes (Signed)
Reports has infection in great toe of left foot that has a hole all the way to the bone. Reports redness swelling and Dr Posey Pronto sent him here for a admission and MRI for osteomyletisi ?

## 2021-08-28 NOTE — Assessment & Plan Note (Signed)
chronic follow up as an out pt ?

## 2021-08-28 NOTE — Progress Notes (Signed)
Pharmacy Antibiotic Note ? ?Marc Mills is a 76 y.o. male admitted on 08/28/2021 with  concern for left foot osteomyelitis .  Pharmacy has been consulted for vancomycin dosing.  ? ?WBC wnl, SCr wnl ? ?Plan: ?-Vancomycin 2500 mg IV load followed by Vancomycin 1750 mg IV Q 12 hrs. Goal AUC 400-550. ?Expected AUC: 507 ?SCr used: 0.89 ?-Ceftriaxone & flagyl per MD ?-Monitor CBC, renal fx, cultures and clinical progress ?-Vanc levels as indicated  ? ?Height: '5\' 11"'$  (180.3 cm) ?Weight: (!) 149.7 kg (330 lb) ?IBW/kg (Calculated) : 75.3 ? ?Temp (24hrs), Avg:98.1 ?F (36.7 ?C), Min:98.1 ?F (36.7 ?C), Max:98.1 ?F (36.7 ?C) ? ?Recent Labs  ?Lab 08/28/21 ?1428  ?WBC 8.3  ?CREATININE 0.89  ?LATICACIDVEN 1.1  ?  ?Estimated Creatinine Clearance: 106.6 mL/min (by C-G formula based on SCr of 0.89 mg/dL).   ? ?Allergies  ?Allergen Reactions  ? Hydrocodone Itching and Other (See Comments)  ?  Severe dizziness, Gives me chills  ? Hydrocodone-Guaifenesin Other (See Comments)  ?  Other reaction(s): chills and makes him feel bad  ? Hydromorphone Hcl Other (See Comments)  ? Oxycodone Itching and Other (See Comments)  ?  Severe dizziness, Gives me chills  ? ? ?Antimicrobials this admission: ?Ceftriaxone 4/26 >>  ?Vancomycin 4/26 >>  ?Metronidazole 4/26 >>  ? ?Dose adjustments this admission: ? ? ?Microbiology results: ?4/26 BCx:  ? ? ?Thank you for allowing pharmacy to be a part of this patient?s care. ? ?Albertina Parr, PharmD., BCCCP ?Clinical Pharmacist ?Please refer to [facility] for unit-specific pharmacist  ? ?

## 2021-08-29 DIAGNOSIS — I10 Essential (primary) hypertension: Secondary | ICD-10-CM | POA: Diagnosis not present

## 2021-08-29 DIAGNOSIS — L03116 Cellulitis of left lower limb: Secondary | ICD-10-CM | POA: Diagnosis not present

## 2021-08-29 DIAGNOSIS — Z86711 Personal history of pulmonary embolism: Secondary | ICD-10-CM | POA: Diagnosis not present

## 2021-08-29 DIAGNOSIS — Z86718 Personal history of other venous thrombosis and embolism: Secondary | ICD-10-CM | POA: Diagnosis not present

## 2021-08-29 LAB — CBC
HCT: 41.6 % (ref 39.0–52.0)
Hemoglobin: 13.9 g/dL (ref 13.0–17.0)
MCH: 31.7 pg (ref 26.0–34.0)
MCHC: 33.4 g/dL (ref 30.0–36.0)
MCV: 95 fL (ref 80.0–100.0)
Platelets: 304 10*3/uL (ref 150–400)
RBC: 4.38 MIL/uL (ref 4.22–5.81)
RDW: 15 % (ref 11.5–15.5)
WBC: 8.3 10*3/uL (ref 4.0–10.5)
nRBC: 0 % (ref 0.0–0.2)

## 2021-08-29 LAB — HEMOGLOBIN A1C
Hgb A1c MFr Bld: 5.6 % (ref 4.8–5.6)
Mean Plasma Glucose: 114.02 mg/dL

## 2021-08-29 LAB — PREALBUMIN: Prealbumin: 21.4 mg/dL (ref 18–38)

## 2021-08-29 LAB — COMPREHENSIVE METABOLIC PANEL
ALT: 17 U/L (ref 0–44)
AST: 19 U/L (ref 15–41)
Albumin: 3.3 g/dL — ABNORMAL LOW (ref 3.5–5.0)
Alkaline Phosphatase: 66 U/L (ref 38–126)
Anion gap: 6 (ref 5–15)
BUN: 12 mg/dL (ref 8–23)
CO2: 27 mmol/L (ref 22–32)
Calcium: 8.9 mg/dL (ref 8.9–10.3)
Chloride: 108 mmol/L (ref 98–111)
Creatinine, Ser: 0.61 mg/dL (ref 0.61–1.24)
GFR, Estimated: >60 mL/min (ref >60)
Glucose, Bld: 103 mg/dL — ABNORMAL HIGH (ref 70–99)
Potassium: 4.2 mmol/L (ref 3.5–5.1)
Sodium: 141 mmol/L (ref 135–145)
Total Bilirubin: 0.4 mg/dL (ref 0.3–1.2)
Total Protein: 6.5 g/dL (ref 6.5–8.1)

## 2021-08-29 LAB — SEDIMENTATION RATE: Sed Rate: 20 mm/hr — ABNORMAL HIGH (ref 0–16)

## 2021-08-29 LAB — C-REACTIVE PROTEIN: CRP: 1.3 mg/dL — ABNORMAL HIGH (ref <1.0)

## 2021-08-29 LAB — MRSA NEXT GEN BY PCR, NASAL: MRSA by PCR Next Gen: NOT DETECTED

## 2021-08-29 MED ORDER — VANCOMYCIN HCL IN DEXTROSE 1-5 GM/200ML-% IV SOLN
1000.0000 mg | Freq: Three times a day (TID) | INTRAVENOUS | Status: DC
  Filled 2021-08-29: qty 200

## 2021-08-29 MED ORDER — IRBESARTAN 150 MG PO TABS
75.0000 mg | ORAL_TABLET | Freq: Every day | ORAL | Status: DC
  Administered 2021-08-29 – 2021-08-30 (×2): 75 mg via ORAL
  Filled 2021-08-29 (×2): qty 1

## 2021-08-29 MED ORDER — CEPHALEXIN 500 MG PO CAPS
500.0000 mg | ORAL_CAPSULE | Freq: Three times a day (TID) | ORAL | Status: DC
  Administered 2021-08-29 – 2021-08-30 (×2): 500 mg via ORAL
  Filled 2021-08-29 (×2): qty 1

## 2021-08-29 MED ORDER — DOXYCYCLINE HYCLATE 100 MG PO TABS
100.0000 mg | ORAL_TABLET | Freq: Two times a day (BID) | ORAL | Status: DC
Start: 2021-08-29 — End: 2021-08-30
  Administered 2021-08-29 – 2021-08-30 (×2): 100 mg via ORAL
  Filled 2021-08-29 (×2): qty 1

## 2021-08-29 MED ORDER — RIVAROXABAN 20 MG PO TABS
20.0000 mg | ORAL_TABLET | Freq: Every day | ORAL | Status: DC
  Administered 2021-08-29: 20 mg via ORAL
  Filled 2021-08-29: qty 1

## 2021-08-29 NOTE — ED Notes (Signed)
Report given to Anna-PM ?

## 2021-08-29 NOTE — Assessment & Plan Note (Signed)
continue home CPAP QHS ?

## 2021-08-29 NOTE — Plan of Care (Signed)

## 2021-08-29 NOTE — Assessment & Plan Note (Signed)
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 ?

## 2021-08-29 NOTE — Progress Notes (Signed)
PT Cancellation Note ? ?Patient Details ?Name: Marc Mills ?MRN: [id number] ?DOB: October 07, 1945 ? ? ?Cancelled Treatment:    Reason Eval/Treat Not Completed: Other (comment).  Pt declined PT and talked with him about wearing the cast shoe to protect L foot as requested by MD.  Check tomorrow in case pt is having issues with mobility.   ? ? ?Ramond Dial ?08/29/2021, 12:19 PM ? ?Mee Hives, PT PhD ?Acute Rehab Dept. Number: [facility] [phone] and [place] [phone] ? ?

## 2021-08-29 NOTE — Consult Note (Signed)
?  [place] for Infectious Disease  ? ? ? ? ? ?Reason for Consult: toe ulcer   ?Referring Physician: Dr. Bonner Puna ? ?Active Problems: ?  Morbid obesity ([place]) ?  Obstructive sleep apnea ?  S/P splenectomy ?  Left leg cellulitis ?  History of pulmonary embolism ?  History of DVT (deep vein thrombosis) ?  Essential hypertension ? ? ? famotidine  20 mg Oral QPM  ? loratadine  10 mg Oral Daily  ? ? ?Recommendations: ?Doxycycline + cephalexin for 7 days ?Will stop current IV antibiotics ?Follow up with ID at [place] ?Other plan per podiatry ? ?Assessment: ?He has an ongoing ulcer but no signs of osteomyelitis other than probing to bone.  ESR and CRP benign and at this time, no indication for prolonged antibiotics, depending on progress of healing.  There may be some edema/cellulitis that is masked by the callus with the soft tissue edema so I recommend doxycycline and cephalexin for a week until his follow up next week with Dr. Idelle Crouch, depending on the plan per podiatry.  He will need a new prescription for doxycycline.   ? ?Antibiotics: ?Vancomycin, ceftriaxone, metronidazole ? ?HPI: Marc Mills is a 76 y.o. male with a history of a left toe wound that started about 2 months ago was sent in by his podiatrist Dr. Posey Pronto for evaluation of his toe with concern for osteomyelitis.  Dr. Posey Pronto noted in clinic that it probed to bone and discussed with the patient his concerns and possible need for amputation.  MRI was done and no concerns for osteomyelitis of the bone and some soft tissue edema that may be infection.  No pus, no celllulitis.  He reports the wound started due to rubbing from his shoe.  He has a history of recurrent cellulitis and followed by Dr. Idelle Crouch at [facility] who he saw 10 days ago and was on doxycycline and cephalexin.   ? ? ?Review of Systems: ? Constitutional: negative for fevers and chills ?All other systems reviewed and are negative   ? ?Past Medical History:  ?Diagnosis Date  ? Anorectal  fistula   ? Arthritis   ? Blood transfusion without reported diagnosis   ? Cancer [facility])   ? lymphoma hx  ? Cataract   ? Cellulitis 04/04/2013  ? DVT (deep venous thrombosis) ([place])   ? 2011 right leg  ? History of lymphoma   ? History of pulmonary embolus (PE)   ? Incisional hernia   ? Morbid obesity ([place])   ? OSA on CPAP   ? cpap  10 yrs  ? Varicose veins of lower extremities   ? pe  ? ? ?Social History  ? ?Tobacco Use  ? Smoking status: Former  ?  Packs/day: 2.00  ?  Years: 20.00  ?  Pack years: 40.00  ?  Types: Cigarettes  ?  Quit date: 05/05/1984  ?  Years since quitting: 37.3  ? Smokeless tobacco: Never  ?Vaping Use  ? Vaping Use: Never used  ?Substance Use Topics  ? Alcohol use: Yes  ?  Alcohol/week: 0.0 standard drinks  ?  Comment: maybe 1 beer/month  ? Drug use: No  ? ? ?Family History  ?Problem Relation Age of Onset  ? Heart disease Father   ? Varicose Veins Father   ? Heart attack Father   ?     dx. 33s  ? Heart disease Mother 65  ? Varicose Veins Mother   ? Pancreatic cancer Sister 52  ?  Cancer Maternal Grandmother   ?     either stomach or pancreatic primary   ? Pancreatic cancer Maternal Grandfather   ?     dx. 10s  ? Lung cancer Maternal Aunt   ?     secondhand smoke exposure  ? Heart attack Paternal Uncle   ? Rectal cancer Maternal Aunt   ?     dx. 36s  ? Cancer Maternal Aunt   ?     unknown type  ? Diabetes Paternal Uncle   ? Cancer Cousin   ?     unknown type  ? Breast cancer Cousin   ?     dx. 69s  ? ? ?Allergies  ?Allergen Reactions  ? Hydrocodone Itching and Other (See Comments)  ?  Severe dizziness, Gives me chills  ? Hydrocodone-Guaifenesin Other (See Comments)  ?  Other reaction(s): chills and makes him feel bad  ? Hydromorphone Hcl Other (See Comments)  ? Oxycodone Itching and Other (See Comments)  ?  Severe dizziness, Gives me chills  ? ? ?Physical Exam: ?Constitutional: in no apparent distress  ?Vitals:  ? 08/29/21 1440 08/29/21 1446  ?BP: (!) 143/72 (!) 158/87  ?Pulse: 60 70  ?Resp: 20 16   ?Temp: 98 ?F (36.7 ?C) 98.2 ?F (36.8 ?C)  ?SpO2: 100% 100%  ? ?EYES: anicteric ?Respiratory: normal respiratory effort ?Musculoskeletal: foot wrapped ?Skin: no rash ? ?Lab Results  ?Component Value Date  ? WBC 8.3 08/29/2021  ? HGB 13.9 08/29/2021  ? HCT 41.6 08/29/2021  ? MCV 95.0 08/29/2021  ? PLT 304 08/29/2021  ?  ?Lab Results  ?Component Value Date  ? CREATININE 0.61 08/29/2021  ? BUN 12 08/29/2021  ? NA 141 08/29/2021  ? K 4.2 08/29/2021  ? CL 108 08/29/2021  ? CO2 27 08/29/2021  ?  ?Lab Results  ?Component Value Date  ? ALT 17 08/29/2021  ? AST 19 08/29/2021  ? ALKPHOS 66 08/29/2021  ?  ? ?Microbiology: ?Recent Results (from the past 240 hour(s))  ?Blood culture (routine x 2)     Status: None (Preliminary result)  ? Collection Time: 08/28/21  3:30 PM  ? Specimen: BLOOD  ?Result Value Ref Range Status  ? Specimen Description BLOOD RIGHT ANTECUBITAL  Final  ? Special Requests   Final  ?  BOTTLES DRAWN AEROBIC AND ANAEROBIC Blood Culture adequate volume  ? Culture   Final  ?  NO GROWTH < 24 HOURS ?Performed at [facility], [place] [address], [place], [place] [address] ?  ? Report Status PENDING  Incomplete  ? ? ?Thayer Headings, MD ?[facility] for Infectious Disease ?[place] ?[email or web address] ?08/29/2021, 4:00 PM  ?

## 2021-08-29 NOTE — Progress Notes (Signed)
?Progress Note ? ?Patient: Marc Mills EKC:[id number] DOB: [birth year]  ?DOA: 08/28/2021  DOS: 08/29/2021  ?  ?Brief hospital course: ?HASSELL PATRAS is a 76 y.o. male with a history of DVT/PE s/p IVC filter on DOAC, morbid obesity (BMI 46), HTN, OSA, lymphoma s/p curative splenectomy, and left toe wound from rubbing on shoe that has remained as an ulcer for about 2 months despite wound care by podiatry and antibiotics per ID at [facility]. He was sent to the ED from podiatry clinic due to concern for osteomyelitis as the wound probed to bone. Fortunately, MRI does not reveal evidence of osteomyelitis. The patient is afebrile with normal white count, CRP of 1.3 and ESR of 20. Podiatry has advised against amputation at this time and ID recommends 7 days of antibiotic therapy. ? ?Assessment and Plan: ?Left great toe cellulitis: Despite probing to bone, no further evidence of osteomyelitis. Discussed case with Dr. Posey Pronto who will reevaluate the patient in the morning, but advises against surgery at this time.  ?- ID consulted, appreciate their recommendation.  ?- Follow up blood cultures, though labs have been reassuring thus far.  ? ?History of DVT s/p IVC filter remotely:  ?- Continue DOAC with no plans for surgery ? ?HTN:  ?- Restart formulary ARB ? ?OSA:  ?- Continue CPAP qHS ? ?Morbid obesity: Body mass index is 46.03 kg/m?.  ? ?History of lymphoma s/p splenectomy: Noted ? ?Subjective: Elated to be avoiding surgery. No pain in the to, diminished sensation in fact which is chronic. No fevers or chills.  ? ?Objective: ?Vitals:  ? 08/29/21 0600 08/29/21 0932 08/29/21 1440 08/29/21 1446  ?BP: 132/80 (!) 142/78 (!) 143/72 (!) 158/87  ?Pulse: 70 72 60 70  ?Resp: '14 20 20 16  ' ?Temp:   98 ?F (36.7 ?C) 98.2 ?F (36.8 ?C)  ?TempSrc:    Oral  ?SpO2: 98% 100% 100% 100%  ?Weight:      ?Height:      ? ?Gen: Nontoxic 76 y.o. male in no distress ?Pulm: Nonlabored breathing room air. Clear ?CV: Regular rate and rhythm. No murmur,  rub, or gallop. No JVD, no significant dependent edema. ?GI: Abdomen soft, non-tender, non-distended, with normoactive bowel sounds.  ?Ext: Warm, no deformities ?Skin: Left great toe with medial deep ulceration with fat layer exposed and granulation tissue. No definite purulence or odor.  ?Neuro: Alert and oriented. Diminished distal LE sensation bilaterally, no acute focal neurological deficits. ?Psych: Judgement and insight appear fair. Mood euthymic & affect congruent. Behavior is appropriate.   ? ?Data Personally reviewed: ? ?CBC: ?Recent Labs  ?Lab 08/28/21 ?1428 08/29/21 ?0530  ?WBC 8.3 8.3  ?NEUTROABS 6.0  --   ?HGB 13.7 13.9  ?HCT 42.4 41.6  ?MCV 96.1 95.0  ?PLT 323 304  ? ?Basic Metabolic Panel: ?Recent Labs  ?Lab 08/28/21 ?1428 08/29/21 ?0530  ?NA 138 141  ?K 4.8 4.2  ?CL 105 108  ?CO2 26 27  ?GLUCOSE 108* 103*  ?BUN 14 12  ?CREATININE 0.89 0.61  ?CALCIUM 9.1 8.9  ? ?GFR: ?Estimated Creatinine Clearance: 118.6 mL/min (by C-G formula based on SCr of 0.61 mg/dL). ?Liver Function Tests: ?Recent Labs  ?Lab 08/28/21 ?1428 08/29/21 ?0530  ?AST 21 19  ?ALT 20 17  ?ALKPHOS 64 66  ?BILITOT 0.7 0.4  ?PROT 6.9 6.5  ?ALBUMIN 3.6 3.3*  ? ?No results for input(s): LIPASE, AMYLASE in the last 168 hours. ?No results for input(s): AMMONIA in the last 168 hours. ?Coagulation Profile: ?No results  for input(s): INR, PROTIME in the last 168 hours. ?Cardiac Enzymes: ?No results for input(s): CKTOTAL, CKMB, CKMBINDEX, TROPONINI in the last 168 hours. ?BNP (last 3 results) ?No results for input(s): PROBNP in the last 8760 hours. ?HbA1C: ?Recent Labs  ?  08/29/21 ?0530  ?HGBA1C 5.6  ? ?CBG: ?No results for input(s): GLUCAP in the last 168 hours. ?Lipid Profile: ?No results for input(s): CHOL, HDL, LDLCALC, TRIG, CHOLHDL, LDLDIRECT in the last 72 hours. ?Thyroid Function Tests: ?No results for input(s): TSH, T4TOTAL, FREET4, T3FREE, THYROIDAB in the last 72 hours. ?Anemia Panel: ?No results for input(s): VITAMINB12, FOLATE,  FERRITIN, TIBC, IRON, RETICCTPCT in the last 72 hours. ?Urine analysis: ?   ?Component Value Date/Time  ? [place] YELLOW 08/31/2015 1827  ? APPEARANCEUR CLEAR 08/31/2015 1827  ? LABSPEC 1.015 08/31/2015 1827  ? PHURINE 7.0 08/31/2015 1827  ? [place] NEGATIVE 08/31/2015 1827  ? HGBUR SMALL (A) 08/31/2015 1827  ? [place] NEGATIVE 08/31/2015 1827  ? BILIRUBINUR negative 03/28/2015 0942  ? BILIRUBINUR neg 04/04/2014 1256  ? [place] NEGATIVE 08/31/2015 1827  ? [place] NEGATIVE 08/31/2015 1827  ? UROBILINOGEN 0.2 03/28/2015 0942  ? UROBILINOGEN 0.2 10/27/2012 1157  ? NITRITE NEGATIVE 08/31/2015 1827  ? LEUKOCYTESUR NEGATIVE 08/31/2015 1827  ? ?Recent Results (from the past 240 hour(s))  ?Blood culture (routine x 2)     Status: None (Preliminary result)  ? Collection Time: 08/28/21  3:30 PM  ? Specimen: BLOOD  ?Result Value Ref Range Status  ? Specimen Description BLOOD RIGHT ANTECUBITAL  Final  ? Special Requests   Final  ?  BOTTLES DRAWN AEROBIC AND ANAEROBIC Blood Culture adequate volume  ? Culture   Final  ?  NO GROWTH < 24 HOURS ?Performed at [facility], [place] [address], [place], [place] [address] ?  ? Report Status PENDING  Incomplete  ?   ?DG Chest 1 View ? ?Result Date: 08/28/2021 ?CLINICAL DATA:  Preop EXAM: CHEST  1 VIEW COMPARISON:  08/31/2015 FINDINGS: Heart and mediastinal contours are within normal limits. No focal opacities or effusions. No acute bony abnormality. Aortic atherosclerosis. IMPRESSION: No active disease. Electronically Signed   By: Rolm Baptise M.D.   On: 08/28/2021 22:09  ? ?CT TIBIA FIBULA LEFT W CONTRAST ? ?Result Date: 08/28/2021 ?CLINICAL DATA:  Foot swelling, nondiabetic, osteomyelitis suspected infection, possible gas on xray, Ortho rec CT for further assessment; Soft tissue infection suspected, lower leg, xray done EXAM: CT OF THE LOWER LEFT EXTREMITY WITH CONTRAST TECHNIQUE: Multidetector CT imaging of the lower left extremity was performed according to the standard  protocol following intravenous contrast administration. RADIATION DOSE REDUCTION: This exam was performed according to the departmental dose-optimization program which includes automated exposure control, adjustment of the mA and/or kV according to patient size and/or use of iterative reconstruction technique. CONTRAST:  21m OMNIPAQUE IOHEXOL 300 MG/ML  SOLN COMPARISON:  None. FINDINGS: Bones/Joint/Cartilage Imaging was performed from the left knee through the left foot. Left tib fib: Changes of left knee replacement. No hardware complicating feature in the tibial component. Femoral component not well visualized on this study. No fracture, subluxation or dislocation. No bone destruction to suggest osteomyelitis. Left ankle/foot: No acute fracture, subluxation or dislocation. No bone destruction to suggest osteomyelitis. Osteoarthritic changes noted in the midfoot and hindfoot. Ligaments Suboptimally assessed by CT. Muscles and Tendons Grossly unremarkable Soft tissues Left tib fib: Soft tissue swelling in the distal calf. Varicose veins noted in the left calf. Left ankle/foot: Soft tissue defect noted in  the great toe with soft tissue swelling of the great toe. Soft tissue swelling diffusely in the foot, particularly along the dorsum of the foot. No soft tissue gas as suggested on plain film. IMPRESSION: No acute bony abnormality. No evidence of osteomyelitis from the left knee through the left foot. Soft tissue ulceration noted in the left great toe. Soft tissue swelling in the left great toe and left foot/ankle. Electronically Signed   By: Rolm Baptise M.D.   On: 08/28/2021 19:24  ? ?CT FOOT LEFT W CONTRAST ? ?Result Date: 08/28/2021 ?CLINICAL DATA:  Foot swelling, nondiabetic, osteomyelitis suspected infection, possible gas on xray, Ortho rec CT for further assessment; Soft tissue infection suspected, lower leg, xray done EXAM: CT OF THE LOWER LEFT EXTREMITY WITH CONTRAST TECHNIQUE: Multidetector CT imaging of  the lower left extremity was performed according to the standard protocol following intravenous contrast administration. RADIATION DOSE REDUCTION: This exam was performed according to the departmental dose-opti

## 2021-08-29 NOTE — Progress Notes (Signed)
Patient received from ED via stretcher. Patient able to ambulate into room. Admission assessment completed with help of Charge RN for skin check.  ?Wound to left great toe covered with nonadherent gauze and foam.  ?

## 2021-08-29 NOTE — Progress Notes (Signed)
Pt has home cpap unit and stated he can placed himself on cpap when ready for bed. ?

## 2021-08-30 DIAGNOSIS — L03116 Cellulitis of left lower limb: Secondary | ICD-10-CM | POA: Diagnosis not present

## 2021-08-30 DIAGNOSIS — I10 Essential (primary) hypertension: Secondary | ICD-10-CM | POA: Diagnosis not present

## 2021-08-30 DIAGNOSIS — Z86718 Personal history of other venous thrombosis and embolism: Secondary | ICD-10-CM | POA: Diagnosis not present

## 2021-08-30 DIAGNOSIS — Z86711 Personal history of pulmonary embolism: Secondary | ICD-10-CM | POA: Diagnosis not present

## 2021-08-30 MED ORDER — DOXYCYCLINE HYCLATE 100 MG PO CAPS: 100.0000 mg | ORAL_CAPSULE | Freq: Two times a day (BID) | ORAL | 0 refills | Status: DC

## 2021-08-30 NOTE — Evaluation (Signed)
Occupational Therapy Evaluation and Discharge ?Patient Details ?Name: Marc Mills ?MRN: [id number] ?DOB: 1945/06/22 ?Today's Date: 08/30/2021 ? ? ?History of Present Illness Pt admitted with non healing L great toe wound. Negative for osteomyelitis. PMH: PE/DVT s/p ICD, HTN, lymphoma, GERD, obesity, cellulitis.  ? ?Clinical Impression ?  ?Pt is completely independent. Encouraged pt to visually monitor his L great toe with mirror or use of cell phone camera for any changes. Pt is aware podiatrist recommends use of post op shoe. No therapy needs.  ?   ? ?Recommendations for follow up therapy are one component of a multi-disciplinary discharge planning process, led by the attending physician.  Recommendations may be updated based on patient status, additional functional criteria and insurance authorization.  ? ?Follow Up Recommendations ? No OT follow up  ?  ?Assistance Recommended at Discharge None  ?Patient can return home with the following   ? ?  ?Functional Status Assessment ?    ?Equipment Recommendations ? None recommended by OT  ?  ?Recommendations for Other Services   ? ? ?  ?Precautions / Restrictions Precautions ?Precautions: None ?Required Braces or Orthoses: Other Brace ?Other Brace: post op shoe ?Restrictions ?Weight Bearing Restrictions: No ?Other Position/Activity Restrictions: WBAT in post op shoe  ? ?  ? ?Mobility Bed Mobility ?  ?  ?  ?  ?  ?  ?  ?  ?  ? ?Transfers ?Overall transfer level: Independent ?Equipment used: None ?  ?  ?  ?  ?  ?  ?  ?  ?  ? ?  ?Balance   ?  ?  ?  ?  ?  ?  ?  ?  ?  ?  ?  ?  ?  ?  ?  ?  ?  ?  ?   ? ?ADL either performed or assessed with clinical judgement  ? ?ADL Overall ADL's : Independent ?  ?  ?  ?  ?  ?  ?  ?  ?  ?  ?  ?  ?  ?  ?  ?  ?  ?  ?  ?   ? ? ? ?Vision Baseline Vision/History: 1 Wears glasses ?Ability to See in Adequate Light: 0 Adequate ?   ?   ?Perception   ?  ?Praxis   ?  ? ?Pertinent Vitals/Pain Pain Assessment ?Pain Assessment: No/denies pain   ? ? ? ?Hand Dominance   ?  ?Extremity/Trunk Assessment Upper Extremity Assessment ?Upper Extremity Assessment: Overall WFL for tasks assessed ?  ?Lower Extremity Assessment ?Lower Extremity Assessment: Overall WFL for tasks assessed ?  ?Cervical / Trunk Assessment ?Cervical / Trunk Assessment: Normal ?  ?Communication Communication ?Communication: No difficulties ?  ?Cognition Arousal/Alertness: Awake/alert ?Behavior During Therapy: [facility] for tasks assessed/performed ?Overall Cognitive Status: Within Functional Limits for tasks assessed ?  ?  ?  ?  ?  ?  ?  ?  ?  ?  ?  ?  ?  ?  ?  ?  ?  ?  ?  ?General Comments    ? ?  ?Exercises   ?  ?Shoulder Instructions    ? ? ?Home Living Family/patient expects to be discharged to:: Private residence ?Living Arrangements: Spouse/significant other ?  ?  ?  ?  ?  ?  ?  ?  ?  ?  ?  ?  ?  ?  ?  ?  ?  ? ?  ?Prior Functioning/Environment Prior  Level of Function : Independent/Modified Independent ?  ?  ?  ?  ?  ?  ?  ?  ?  ? ?  ?  ?OT Problem List:   ?  ?   ?OT Treatment/Interventions:    ?  ?OT Goals(Current goals can be found in the care plan section)    ?OT Frequency:   ?  ? ?Co-evaluation   ?  ?  ?  ?  ? ?  ?AM-PAC OT "6 Clicks" Daily Activity     ?Outcome Measure Help from another person eating meals?: None ?Help from another person taking care of personal grooming?: None ?Help from another person toileting, which includes using toliet, bedpan, or urinal?: None ?Help from another person bathing (including washing, rinsing, drying)?: None ?Help from another person to put on and taking off regular upper body clothing?: None ?Help from another person to put on and taking off regular lower body clothing?: None ?6 Click Score: 24 ?  ?End of Session   ? ?Activity Tolerance: Patient tolerated treatment well ?Patient left: in chair;with call bell/phone within reach ? ?OT Visit Diagnosis: Other abnormalities of gait and mobility (R26.89)  ?              ?Time: 1610-9604 ?OT Time Calculation  (min): 15 min ?Charges:  OT General Charges ?$OT Visit: 1 Visit ?OT Evaluation ?$OT Eval Low Complexity: 1 Low ? ?Marc Mills, OTR/L ?Acute Rehabilitation Services ?Pager: [phone] ?Office: [phone]  ? ?Marc Mills ?08/30/2021, 8:57 AM ?

## 2021-08-30 NOTE — Progress Notes (Signed)
Pt discharged home in stable condition. Discharge instructions given. Pt verbalized understanding. Script sent to pharmacy of choice. No immediate questions or concerns at this time.  ?

## 2021-08-30 NOTE — Consult Note (Signed)
?Subjective:  ?Patient ID: Marc Mills, male    DOB: [birth year],  MRN: [id number] ? ?Chief Complaint  ?Patient presents with  ? Toe Pain  ? ? ?A 76 y.o. male edical history significant of chornic left toe wound, hx of PE/DVT sp IVC filter, GERD, HTN presents with left hallux wound with fat layer exposed.  He states he is doing well.  He is here to discuss the MRI as well.  He denies any other acute complaints.  No nausea fever chills vomiting the toe feels fine. ? ?Review of Systems: Negative except as noted in the HPI. Denies N/V/F/Ch. ? ?Past Medical History:  ?Diagnosis Date  ? Anorectal fistula   ? Arthritis   ? Blood transfusion without reported diagnosis   ? Cancer [facility])   ? lymphoma hx  ? Cataract   ? Cellulitis 04/04/2013  ? DVT (deep venous thrombosis) ([place])   ? 2011 right leg  ? History of lymphoma   ? History of pulmonary embolus (PE)   ? Incisional hernia   ? Morbid obesity ([place])   ? OSA on CPAP   ? cpap  10 yrs  ? Varicose veins of lower extremities   ? pe  ? ? ?Current Facility-Administered Medications:  ?  acetaminophen (TYLENOL) tablet 650 mg, 650 mg, Oral, Q6H PRN, 650 mg at 08/29/21 0932 **OR** acetaminophen (TYLENOL) suppository 650 mg, 650 mg, Rectal, Q6H PRN, Doutova, Anastassia, MD ?  cephALEXin (KEFLEX) capsule 500 mg, 500 mg, Oral, TID, Patrecia Pour, MD, 500 mg at 08/29/21 2118 ?  doxycycline (VIBRA-TABS) tablet 100 mg, 100 mg, Oral, Q12H, Vance Gather B, MD, 100 mg at 08/29/21 2118 ?  famotidine (PEPCID) tablet 20 mg, 20 mg, Oral, QPM, Doutova, Anastassia, MD, 20 mg at 08/29/21 1823 ?  fentaNYL (SUBLIMAZE) injection 12.5-50 mcg, 12.5-50 mcg, Intravenous, Q2H PRN, Doutova, Anastassia, MD ?  irbesartan (AVAPRO) tablet 75 mg, 75 mg, Oral, Daily, Vance Gather B, MD, 75 mg at 08/29/21 1823 ?  loratadine (CLARITIN) tablet 10 mg, 10 mg, Oral, Daily, Doutova, Anastassia, MD ?  rivaroxaban (XARELTO) tablet 20 mg, 20 mg, Oral, Q supper, Patrecia Pour, MD, 20 mg at 08/29/21 1823 ? ?Social History   ? ?Tobacco Use  ?Smoking Status Former  ? Packs/day: 2.00  ? Years: 20.00  ? Pack years: 40.00  ? Types: Cigarettes  ? Quit date: 05/05/1984  ? Years since quitting: 37.3  ?Smokeless Tobacco Never  ? ? ?Allergies  ?Allergen Reactions  ? Hydrocodone Itching and Other (See Comments)  ?  Severe dizziness, Gives me chills  ? Hydrocodone-Guaifenesin Other (See Comments)  ?  Other reaction(s): chills and makes him feel bad  ? Hydromorphone Hcl Other (See Comments)  ? Oxycodone Itching and Other (See Comments)  ?  Severe dizziness, Gives me chills  ? ?Objective:  ? ?Vitals:  ? 08/29/21 1954 08/30/21 0350  ?BP: 140/75 131/75  ?Pulse: 71 64  ?Resp: 18 20  ?Temp: (!) 97.5 ?F (36.4 ?C) 98.3 ?F (36.8 ?C)  ?SpO2: 100% 96%  ? ?Body mass index is 46.03 kg/m?Marland Kitchen ?Constitutional Well developed. ?Well nourished.  ?Vascular Dorsalis pedis pulses faint delay palpable bilaterally. ?Posterior tibial pulses none palpable bilaterally. ?Capillary refill diminished to all digits.  ?No cyanosis or clubbing noted. ?Pedal hair growth normal.  ?Neurologic Normal speech. ?Oriented to person, place, and time. ?Protective sensation absent  ?Dermatologic Wound Location: Left hallux ulceration now probing down to bone.  Does not probe down to deep tissue.  Signs  of skin fissuring noted.  No malodor present.  Erythema noted circumferential around the toe ?Wound Base: Mixed Granular/Fibrotic ?Peri-wound: Calloused ?Exudate: Scant/small amount Serosanguinous exudate ?Wound Measurements: ?-See below  ?Orthopedic: No pain to palpation either foot.  ? ?Radiographs: IMPRESSION: ?1. Soft tissue wound along the plantar aspect of the first IP joint. ?No evidence of osteomyelitis of the left forefoot. ?2. Soft tissue edema along the dorsal aspect of the foot which may ?be reactive versus secondary to cellulitis. No drainable fluid ?collection to suggest an abscess. ?  ? ? ? ? ?Assessment:  ? ?1. Toe infection   ?2. Chronic anticoagulation   ?3. Cellulitis of left  lower extremity   ? ? ?Plan:  ?Patient was evaluated and treated and all questions answered. ? ?Ulcer left hallux fat layer exposed without underlying osteomyelitis/abscess ?-Clinically at this time the wound appears to be very stable.  There is still some edema present.  No purulent drainage noted. ?-Given the findings of MRI without osteomyelitis at this time I believe patient will benefit from local wound care and oral antibiotics. ?-We will hold off on any surgical plans. ?-Patient is okay to be discharged from my standpoint.  I will see him in clinic next Friday to start the graft application process. ?-Weightbearing as tolerated in a surgical shoe. ?-Betadine wet-to-dry dressing change daily until follow-up. ?-He will follow-up outpatient infectious disease for antibiotics. ? ?No follow-ups on file. ? ?  ?

## 2021-08-30 NOTE — Discharge Summary (Signed)
?Physician Discharge Summary ?  ?Patient: Marc Mills MRN: [id number] DOB: 07/25/1945  ?Admit date:     08/28/2021  ?Discharge date: 08/30/21  ?Discharge Physician: Patrecia Pour  ? ?PCP: Josetta Huddle, MD  ? ?Recommendations at discharge:  ?Continue keflex and doxycycline for 7 days per ID recommendations. Follow up with [facility] and with Dr. Posey Pronto, podiatry next week.  ? ?Discharge Diagnoses: ?Active Problems: ?  Left leg cellulitis ?  Essential hypertension ?  History of DVT (deep vein thrombosis) ?  History of pulmonary embolism ?  Morbid obesity ([place]) ?  Obstructive sleep apnea ?  S/P splenectomy ? ?Hospital Course: ?[name] is a 76 y.o. male with a history of DVT/PE s/p IVC filter on DOAC, morbid obesity (BMI 46), HTN, OSA, lymphoma s/p curative splenectomy, and left toe wound from rubbing on shoe that has remained as an ulcer for about 2 months despite wound care by podiatry and antibiotics per ID at [facility]. He was sent to the ED from podiatry clinic due to concern for osteomyelitis as the wound probed to bone. Fortunately, MRI does not reveal evidence of osteomyelitis. The patient is afebrile with normal white count, CRP of 1.3 and ESR of 20. Podiatry has advised against amputation at this time and ID recommends 7 days of antibiotic therapy. ? ?Assessment and Plan: ?Left great toe cellulitis: Despite probing to bone, no further evidence of osteomyelitis.  ?- Discussed case with Dr. Posey Pronto who advises against surgery at this time, will arrange follow up in clinic to begin graft next week.  ?- ID consulted, appreciate their recommendation. Continue keflex + doxycycline x7 days. ?- Follow up blood cultures (NG2D at time of discharge) though labs have been reassuring thus far.  ?  ?History of DVT s/p IVC filter remotely:  ?- Continue DOAC with no plans for surgery ?  ?HTN:  ?- Continue home med ?  ?OSA:  ?- Continue CPAP qHS ?  ?Morbid obesity: Body mass index is 46.03 kg/m?.  ?  ?History of  lymphoma s/p splenectomy: Noted ? ?Consultants: ID, Podiatry ?Procedures performed: None  ?Disposition: Home ?Diet recommendation:  ?Cardiac diet ?DISCHARGE MEDICATION: ?Allergies as of 08/30/2021   ? ?   Reactions  ? Hydrocodone Itching, Other (See Comments)  ? Severe dizziness, Gives me chills  ? Hydrocodone-guaifenesin Other (See Comments)  ? Other reaction(s): chills and makes him feel bad  ? Hydromorphone Hcl Other (See Comments)  ? Oxycodone Itching, Other (See Comments)  ? Severe dizziness, Gives me chills  ? ?  ? ?  ?Medication List  ?  ? ?STOP taking these medications   ? ?amoxicillin-clavulanate 875-125 MG tablet ?Commonly known as: AUGMENTIN ?  ?Shingrix injection ?Generic drug: Zoster Vaccine Adjuvanted ?  ?TETANUS-DIPHTHERIA TOXOIDS TD IM ?  ?traMADol 50 MG tablet ?Commonly known as: ULTRAM ?  ?traMADol HCl 100 MG Tabs ?  ?Viagra 100 MG tablet ?Generic drug: sildenafil ?  ? ?  ? ?TAKE these medications   ? ?acetaminophen 500 MG tablet ?Commonly known as: TYLENOL ?Take 1,000 mg by mouth daily. ?  ?BinaxNOW COVID-19 Ag Home Test Kit ?Generic drug: COVID-19 At Home Antigen Test ?TEST AS DIRECTED TODAY ?  ?celecoxib 200 MG capsule ?Commonly known as: CELEBREX ?Take 200 mg by mouth every other day. ?  ?cephALEXin 500 MG capsule ?Commonly known as: KEFLEX ?Take 500 mg by mouth 3 (three) times daily. contineuos ?  ?cetirizine 5 MG tablet ?Commonly known as: ZYRTEC ?Take 1 tablet by mouth  daily as needed for allergies. ?  ?CoQ-10 30 MG Caps ?Take 1 capsule by mouth daily. ?  ?cyclobenzaprine 10 MG tablet ?Commonly known as: FLEXERIL ?Take 10 mg by mouth 3 (three) times daily as needed for muscle spasms. ?  ?diclofenac Sodium 1 % Gel ?Commonly known as: VOLTAREN ?2 g daily as needed (pain). ?  ?doxycycline 100 MG capsule ?Commonly known as: VIBRAMYCIN ?Take 1 capsule (100 mg total) by mouth 2 (two) times daily. ?  ?famotidine 20 MG tablet ?Commonly known as: PEPCID ?Take 20 mg by mouth every evening. ?  ?Ginger  Extract 250 MG Caps ?Take 1 capsule by mouth daily. ?  ?Multivitamin Adult Tabs ?Take 1 tablet by mouth daily. ?  ?NAC 500 MG Caps ?Generic drug: Acetylcysteine ?Take 500 mg by mouth daily. ?  ?Oncovite Tabs ?Take 1 tablet by mouth daily. ?  ?Quercetin 50 MG Tabs ?Take 50 mg by mouth daily. ?  ?rivaroxaban 20 MG Tabs tablet ?Commonly known as: XARELTO ?Take 20 mg by mouth daily. ?  ?[place]mon Oil Caps ?Take 1 capsule by mouth daily. ?  ?Santyl 250 UNIT/GM ointment ?Generic drug: collagenase ?Apply 1 application. topically daily. ?  ?Santyl 250 UNIT/GM ointment ?Generic drug: collagenase ?Apply 1 application. topically daily. ?  ?sildenafil 20 MG tablet ?Commonly known as: REVATIO ?Take 20 mg by mouth daily as needed (erectile dysfunction). ?  ?valsartan 80 MG tablet ?Commonly known as: DIOVAN ?Take 80 mg by mouth every evening. ?  ?Vitamin B Complex Tabs ?Take 1 tablet by mouth daily. ?  ?Vitamin C 500 MG Caps ?Take 500 mg by mouth daily. ?  ?Vitamin D3 Ultra Strength 125 MCG (5000 UT) capsule ?Generic drug: Cholecalciferol ?Take 5,000 Units by mouth daily. ?  ?vitamin E 180 MG (400 UNITS) capsule ?Take 400 Units by mouth daily. ?  ?Zinc 30 MG Tabs ?Take 30 mg by mouth daily. ?  ? ?  ? ? Follow-up Information   ? ? Josetta Huddle, MD Follow up.   ?Specialty: Internal Medicine ?Contact information: ?[address] ?[address] ?[place] [address] ?[phone] ? ? ?  ?  ? ? Felipa Furnace, DPM. Schedule an appointment as soon as possible for a visit in 1 week(s).   ?Specialty: Podiatry ?Contact information: ?[address] [place] [address] ?[phone] ? ? ?  ?  ? ?  ?  ? ?  ? ?Discharge Exam: ?Filed Weights  ? 08/28/21 1414  ?Weight: (!) 149.7 kg  ?BP 133/72 (BP Location: Right Arm)   Pulse 68   Temp 98.6 ?F (37 ?C) (Oral)   Resp 17   Ht _0  (1.803 m)   Wt (!) 149.7 kg   SpO2 96%   BMI 46.03 kg/m?  ?No distress, well-appearing ? ? ?Condition at discharge: good ? ?The results of significant  diagnostics from this hospitalization (including imaging, microbiology, ancillary and laboratory) are listed below for reference.  ? ?Imaging Studies: ?DG Chest 1 View ? ?Result Date: 08/28/2021 ?CLINICAL DATA:  Preop EXAM: CHEST  1 VIEW COMPARISON:  08/31/2015 FINDINGS: Heart and mediastinal contours are within normal limits. No focal opacities or effusions. No acute bony abnormality. Aortic atherosclerosis. IMPRESSION: No active disease. Electronically Signed   By: Rolm Baptise M.D.   On: 08/28/2021 22:09  ? ?CT TIBIA FIBULA LEFT W CONTRAST ? ?Result Date: 08/28/2021 ?CLINICAL DATA:  Foot swelling, nondiabetic, osteomyelitis suspected infection, possible gas on xray, Ortho rec CT for further assessment; Soft tissue infection suspected, lower  leg, xray done EXAM: CT OF THE LOWER LEFT EXTREMITY WITH CONTRAST TECHNIQUE: Multidetector CT imaging of the lower left extremity was performed according to the standard protocol following intravenous contrast administration. RADIATION DOSE REDUCTION: This exam was performed according to the departmental dose-optimization program which includes automated exposure control, adjustment of the mA and/or kV according to patient size and/or use of iterative reconstruction technique. CONTRAST:  58m OMNIPAQUE IOHEXOL 300 MG/ML  SOLN COMPARISON:  None. FINDINGS: Bones/Joint/Cartilage Imaging was performed from the left knee through the left foot. Left tib fib: Changes of left knee replacement. No hardware complicating feature in the tibial component. Femoral component not well visualized on this study. No fracture, subluxation or dislocation. No bone destruction to suggest osteomyelitis. Left ankle/foot: No acute fracture, subluxation or dislocation. No bone destruction to suggest osteomyelitis. Osteoarthritic changes noted in the midfoot and hindfoot. Ligaments Suboptimally assessed by CT. Muscles and Tendons Grossly unremarkable Soft tissues Left tib fib: Soft tissue swelling in the  distal calf. Varicose veins noted in the left calf. Left ankle/foot: Soft tissue defect noted in the great toe with soft tissue swelling of the great toe. Soft tissue swelling diffusely in the foot, particularly along the d

## 2021-09-02 ENCOUNTER — Ambulatory Visit: Payer: Medicare Other | Admitting: Podiatry

## 2021-09-02 LAB — CULTURE, BLOOD (ROUTINE X 2)
Culture: NO GROWTH
Special Requests: ADEQUATE

## 2021-09-04 ENCOUNTER — Telehealth: Payer: Self-pay | Admitting: Podiatry

## 2021-09-04 DIAGNOSIS — R600 Localized edema: Secondary | ICD-10-CM | POA: Diagnosis not present

## 2021-09-04 DIAGNOSIS — L0390 Cellulitis, unspecified: Secondary | ICD-10-CM | POA: Diagnosis not present

## 2021-09-04 DIAGNOSIS — I878 Other specified disorders of veins: Secondary | ICD-10-CM | POA: Diagnosis not present

## 2021-09-04 DIAGNOSIS — H401122 Primary open-angle glaucoma, left eye, moderate stage: Secondary | ICD-10-CM | POA: Diagnosis not present

## 2021-09-04 DIAGNOSIS — L97521 Non-pressure chronic ulcer of other part of left foot limited to breakdown of skin: Secondary | ICD-10-CM | POA: Diagnosis not present

## 2021-09-04 NOTE — Telephone Encounter (Signed)
Pt called in and stated that he has decided not to come in and have the procedure done w/ Dr. Posey Pronto on Friday. He wants to get a 2nd opinion. He will call back and set-up appt if necessary after he sees the other provider. ?

## 2021-09-05 DIAGNOSIS — G603 Idiopathic progressive neuropathy: Secondary | ICD-10-CM | POA: Diagnosis not present

## 2021-09-05 DIAGNOSIS — L97526 Non-pressure chronic ulcer of other part of left foot with bone involvement without evidence of necrosis: Secondary | ICD-10-CM | POA: Diagnosis not present

## 2021-09-05 DIAGNOSIS — L97529 Non-pressure chronic ulcer of other part of left foot with unspecified severity: Secondary | ICD-10-CM | POA: Diagnosis not present

## 2021-09-09 DIAGNOSIS — L97529 Non-pressure chronic ulcer of other part of left foot with unspecified severity: Secondary | ICD-10-CM | POA: Diagnosis not present

## 2021-09-13 DIAGNOSIS — L97526 Non-pressure chronic ulcer of other part of left foot with bone involvement without evidence of necrosis: Secondary | ICD-10-CM | POA: Diagnosis not present

## 2021-09-13 DIAGNOSIS — G603 Idiopathic progressive neuropathy: Secondary | ICD-10-CM | POA: Diagnosis not present

## 2021-09-17 ENCOUNTER — Ambulatory Visit (INDEPENDENT_AMBULATORY_CARE_PROVIDER_SITE_OTHER): Payer: Medicare Other

## 2021-09-17 ENCOUNTER — Ambulatory Visit (INDEPENDENT_AMBULATORY_CARE_PROVIDER_SITE_OTHER): Payer: Medicare Other | Admitting: Physician Assistant

## 2021-09-17 ENCOUNTER — Encounter: Payer: Self-pay | Admitting: Physician Assistant

## 2021-09-17 DIAGNOSIS — Z9889 Other specified postprocedural states: Secondary | ICD-10-CM

## 2021-09-17 NOTE — Progress Notes (Signed)
? ?Office Visit Note ?  ?Patient: Marc Mills           ?Date of Birth: [birth year]           ?MRN: [id number] ?Visit Date: 09/17/2021 ?             ?Requested by: Marc Huddle, MD ?[address] ?[address] ?[place],  [place] [address] ?PCP: Marc Huddle, MD ? ?Chief Complaint  ?Patient presents with  ? Left Knee - Follow-up  ? ? ? ? ?HPI: ?Marc Mills is a pleasant 76 year old gentleman who is 8 years status post left total knee arthroplasty with Dr. Durward Mills.  He was in the gym a couple days ago and sustained a fall.  He has been wearing a postoperative shoe for a knee infection that is being treated by podiatrist and he thinks that threw him off balance.  He was able to get up and ambulate.  He admits is feeling a little bit better today.  He is using his cane.  He is on blood thinner ? ?Assessment & Plan: ?Visit Diagnoses:  ?Left knee contusion ? ?Plan: We will treat this symptomatically with ice and Tylenol as he cannot take anti-inflammatories.  Should continue to use cane until he feels stable.  We will follow-up if no improvement in the next week. ? ?Follow-Up Instructions: No follow-ups on file.  ? ?Ortho Exam ? ?Patient is alert, oriented, no adenopathy, well-dressed, normal affect, normal respiratory effort. ?Left knee no real warmth redness or obvious deformity well-healed surgical scar he has moderate ecchymosis over the medial posterior knee.  He is able to sustain a straight leg raise.  He is Globally tender over the knee.  No effusion ? ?Imaging: ?XR Knee 1-2 Views Left ? ?Result Date: 09/17/2021 ?Radiographs of his left knee were reviewed today.  He is status post left knee replacement.  Components are all in good position without any migration.  No evidence of any periprosthetic fracture or loosening  ?No images are attached to the encounter. ? ?Labs: ?Lab Results  ?Component Value Date  ? HGBA1C 5.6 08/29/2021  ? HGBA1C 5.8 (H) 09/01/2015  ? HGBA1C 5.8 (H) 03/19/2015  ? ESRSEDRATE 20 (H)  08/29/2021  ? ESRSEDRATE 1 04/08/2010  ? CRP 1.3 (H) 08/29/2021  ? LABURIC 4.8 12/03/2012  ? LABURIC 5.4 09/16/2012  ? LABURIC 6.1 09/03/2012  ? REPTSTATUS 09/02/2021 FINAL 08/28/2021  ? GRAMSTAIN No WBC Seen 07/27/2013  ? GRAMSTAIN No Squamous Epithelial Cells Seen 07/27/2013  ? GRAMSTAIN Few GRAM POSITIVE COCCI IN PAIRS 07/27/2013  ? CULT  08/28/2021  ?  NO GROWTH 5 DAYS ?Performed at [facility], [place] [address], [place], [place] [address] ?  ? LABORGA NO GROWTH 03/28/2015  ? ? ? ?Lab Results  ?Component Value Date  ? ALBUMIN 3.3 (L) 08/29/2021  ? ALBUMIN 3.6 08/28/2021  ? ALBUMIN 4.0 09/12/2016  ? PREALBUMIN 21.4 08/29/2021  ? ? ?Lab Results  ?Component Value Date  ? MG 1.8 09/01/2015  ? ?No results found for: VD25OH ? ?Lab Results  ?Component Value Date  ? PREALBUMIN 21.4 08/29/2021  ? ? ?  Latest Ref Rng & Units 08/29/2021  ?  5:30 AM 08/28/2021  ?  2:28 PM 09/12/2016  ?  9:54 AM  ?CBC EXTENDED  ?WBC 4.0 - 10.5 K/uL 8.3   8.3   6.0    ?RBC 4.22 - 5.81 MIL/uL 4.38   4.41   4.73    ?Hemoglobin 13.0 - 17.0  g/dL 13.9   13.7   14.1    ?HCT 39.0 - 52.0 % 41.6   42.4   43.4    ?Platelets 150 - 400 K/uL 304   323   307    ?NEUT# 1.7 - 7.7 K/uL  6.0   3.6    ?Lymph# 0.7 - 4.0 K/uL  0.9   1.0    ? ? ? ?There is no height or weight on file to calculate BMI. ? ?Orders:  ?Orders Placed This Encounter  ?Procedures  ? XR Knee 1-2 Views Left  ? ?No orders of the defined types were placed in this encounter. ? ? ? Procedures: ?No procedures performed ? ?Clinical Data: ?No additional findings. ? ?ROS: ? ?All other systems negative, except as noted in the HPI. ?Review of Systems ? ?Objective: ?Vital Signs: There were no vitals taken for this visit. ? ?Specialty Comments:  ?No specialty comments available. ? ?PMFS History: ?Patient Active Problem List  ? Diagnosis Date Noted  ? Essential hypertension 08/28/2021  ? Low back pain 06/27/2021  ? Acute embolism and thrombosis of unspecified deep veins of unspecified lower extremity  ([place]) 06/03/2021  ? Anal fistula 06/03/2021  ? Anxiety 06/03/2021  ? Erectile dysfunction 06/03/2021  ? Essential tremor 06/03/2021  ? History of lymphoma 06/03/2021  ? Impacted cerumen 06/03/2021  ? Peripheral neuropathy 06/03/2021  ? Personal history of colonic polyps 06/03/2021  ? Rectal fistula 06/03/2021  ? Inflamed seborrheic keratosis 06/03/2021  ? Vitamin D deficiency 06/03/2021  ? Tremor 06/03/2021  ? Eustachian tube dysfunction, left 12/10/2020  ? Otalgia, left 12/10/2020  ? Nondisplaced fracture of distal phalanx of left middle finger, initial encounter for open fracture 09/18/2020  ? Pain in right hip 03/27/2020  ? Laceration of right ring finger 05/05/2019  ? Laceration of flexor muscle, fascia and tendon of right ring finger at wrist and hand level, initial encounter 04/23/2019  ? Unspecified fracture of the lower end of right radius, initial encounter for closed fracture 04/23/2019  ? Fracture of unspecified phalanx of right ring finger, initial encounter for open fracture 04/23/2019  ? Left knee pain 06/14/2018  ? Pain in right ankle and joints of right foot 12/18/2017  ? Bilateral edema of lower extremity 10/30/2015  ? Onychomycosis 10/30/2015  ? Sepsis ([place]) 08/31/2015  ? History of pulmonary embolism   ? History of DVT (deep vein thrombosis)   ? Pyrexia   ? Cellulitis 03/18/2015  ? Chronic anticoagulation 03/17/2015  ? Morbid obesity with BMI of 45.0-49.9, adult ([place]) 03/17/2015  ? S/P IVC filter 03/17/2015  ? Genetic testing 01/12/2015  ? Postoperative wound infection 12/09/2013  ? Postop check 11/24/2013  ? Ulcer of lower limb, unspecified 08/24/2013  ? Inguinal lymphadenopathy 08/16/2013  ? Encounter for therapeutic drug monitoring 06/09/2013  ? Left leg cellulitis 04/04/2013  ? Osteoarthritis of left knee 11/02/2012  ? Cellulitis of right lower extremity 04/30/2012  ? 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 04/29/2012  ? S/P splenectomy 04/29/2012  ? Incisional hernia 05/27/2011  ? Splenic marginal  zone b-cell lymphoma ([place]) 04/16/2010  ? Morbid obesity ([place]) 04/19/2008  ? Obstructive sleep apnea 04/19/2008  ? VARICOSE VEINS, LOWER EXTREMITIES 04/19/2008  ? PULMONARY EMBOLISM, HX OF 04/19/2008  ? ?Past Medical History:  ?Diagnosis Date  ? Anorectal fistula   ? Arthritis   ? Blood transfusion without reported diagnosis   ? Cancer [facility])   ? lymphoma hx  ? Cataract   ? Cellulitis 04/04/2013  ? DVT (deep  venous thrombosis) ([place])   ? 2011 right leg  ? History of lymphoma   ? History of pulmonary embolus ([place])   ? Incisional hernia   ? Morbid obesity ([place])   ? OSA on CPAP   ? cpap  10 yrs  ? Varicose veins of lower extremities   ? pe  ?  ?Family History  ?Problem Relation Age of Onset  ? Heart disease Father   ? Varicose Veins Father   ? Heart attack Father   ?     dx. 90s  ? Heart disease Mother 37  ? Varicose Veins Mother   ? Pancreatic cancer Sister 37  ? Cancer Maternal Grandmother   ?     either stomach or pancreatic primary   ? Pancreatic cancer Maternal Grandfather   ?     dx. 35s  ? Lung cancer Maternal Aunt   ?     secondhand smoke exposure  ? Heart attack Paternal Uncle   ? Rectal cancer Maternal Aunt   ?     dx. 77s  ? Cancer Maternal Aunt   ?     unknown type  ? Diabetes Paternal Uncle   ? Cancer Cousin   ?     unknown type  ? Breast cancer Cousin   ?     dx. 48s  ?  ?Past Surgical History:  ?Procedure Laterality Date  ? EYE SURGERY Left 03  ? cat, detached ret    ? FRACTURE SURGERY    ? HERNIA REPAIR    ? INGUINAL LYMPH NODE BIOPSY [name] 11/15/2013  ? Procedure: RIGHT INGUINAL LYMPH NODE BIOPSY;  Surgeon: Gwenyth Ober, MD;  Location: [facility];  Service: General;  Laterality: [name];  ? ivc filter    ? PROSTATE SURGERY    ? (patient denies)  ? SPLENECTOMY    ? TONSILLECTOMY    ? age 2  ? TOTAL KNEE ARTHROPLASTY Left 11/02/2012  ? Procedure: TOTAL KNEE ARTHROPLASTY with revision tibia;  Surgeon: Garald Balding, MD;  Location: [facility];  Service: Orthopedics;  Laterality: Left;  ? ?Social History  ? ?Occupational  History  ? Not on file  ?Tobacco Use  ? Smoking status: Former  ?  Packs/day: 2.00  ?  Years: 20.00  ?  Pack years: 40.00  ?  Types: Cigarettes  ?  Quit date: 05/05/1984  ?  Years since quitting: 37.3  ?

## 2021-09-23 DIAGNOSIS — L97526 Non-pressure chronic ulcer of other part of left foot with bone involvement without evidence of necrosis: Secondary | ICD-10-CM | POA: Diagnosis not present

## 2021-09-23 DIAGNOSIS — I872 Venous insufficiency (chronic) (peripheral): Secondary | ICD-10-CM | POA: Diagnosis not present

## 2021-09-23 DIAGNOSIS — G603 Idiopathic progressive neuropathy: Secondary | ICD-10-CM | POA: Diagnosis not present

## 2021-10-03 ENCOUNTER — Other Ambulatory Visit: Payer: Self-pay | Admitting: Internal Medicine

## 2021-10-03 ENCOUNTER — Other Ambulatory Visit: Payer: Self-pay

## 2021-10-03 DIAGNOSIS — M545 Low back pain, unspecified: Secondary | ICD-10-CM

## 2021-10-04 ENCOUNTER — Telehealth: Payer: Self-pay | Admitting: Orthopaedic Surgery

## 2021-10-04 NOTE — Telephone Encounter (Signed)
Pt called requesting a referral be sent for back injection at [facility]. Please call pt about this matter at [phone].

## 2021-10-04 NOTE — Telephone Encounter (Signed)
Order has been placed and patient notified.

## 2021-10-09 DIAGNOSIS — G603 Idiopathic progressive neuropathy: Secondary | ICD-10-CM | POA: Diagnosis not present

## 2021-10-09 DIAGNOSIS — L97526 Non-pressure chronic ulcer of other part of left foot with bone involvement without evidence of necrosis: Secondary | ICD-10-CM | POA: Diagnosis not present

## 2021-10-14 ENCOUNTER — Ambulatory Visit
Admission: RE | Admit: 2021-10-14 | Discharge: 2021-10-14 | Disposition: A | Discharge status: Home / Self Care | Payer: Medicare Other | Source: Ambulatory Visit | Attending: Orthopaedic Surgery | Admitting: Orthopaedic Surgery

## 2021-10-14 DIAGNOSIS — M545 Low back pain, unspecified: Secondary | ICD-10-CM

## 2021-10-14 DIAGNOSIS — M47817 Spondylosis without myelopathy or radiculopathy, lumbosacral region: Secondary | ICD-10-CM | POA: Diagnosis not present

## 2021-10-14 MED ORDER — IOPAMIDOL (ISOVUE-M 200) INJECTION 41%
1.0000 mL | Freq: Once | INTRAMUSCULAR | Status: AC
  Administered 2021-10-14: 1 mL via EPIDURAL

## 2021-10-14 MED ORDER — METHYLPREDNISOLONE ACETATE 40 MG/ML INJ SUSP (RADIOLOG
80.0000 mg | Freq: Once | INTRAMUSCULAR | Status: AC
  Administered 2021-10-14: 80 mg via EPIDURAL

## 2021-10-14 NOTE — Discharge Instructions (Signed)

## 2021-10-23 DIAGNOSIS — I10 Essential (primary) hypertension: Secondary | ICD-10-CM | POA: Diagnosis not present

## 2021-10-23 DIAGNOSIS — G629 Polyneuropathy, unspecified: Secondary | ICD-10-CM | POA: Diagnosis not present

## 2021-10-23 DIAGNOSIS — L97526 Non-pressure chronic ulcer of other part of left foot with bone involvement without evidence of necrosis: Secondary | ICD-10-CM | POA: Diagnosis not present

## 2021-10-23 DIAGNOSIS — G603 Idiopathic progressive neuropathy: Secondary | ICD-10-CM | POA: Diagnosis not present

## 2021-10-29 DIAGNOSIS — R030 Elevated blood-pressure reading, without diagnosis of hypertension: Secondary | ICD-10-CM | POA: Diagnosis not present

## 2021-10-29 DIAGNOSIS — Z6841 Body Mass Index (BMI) 40.0 and over, adult: Secondary | ICD-10-CM | POA: Diagnosis not present

## 2021-10-30 ENCOUNTER — Encounter: Payer: Self-pay | Admitting: Internal Medicine

## 2021-11-06 DIAGNOSIS — G603 Idiopathic progressive neuropathy: Secondary | ICD-10-CM | POA: Diagnosis not present

## 2021-11-06 DIAGNOSIS — L97526 Non-pressure chronic ulcer of other part of left foot with bone involvement without evidence of necrosis: Secondary | ICD-10-CM | POA: Diagnosis not present

## 2021-11-20 DIAGNOSIS — G603 Idiopathic progressive neuropathy: Secondary | ICD-10-CM | POA: Diagnosis not present

## 2021-11-20 DIAGNOSIS — L97526 Non-pressure chronic ulcer of other part of left foot with bone involvement without evidence of necrosis: Secondary | ICD-10-CM | POA: Diagnosis not present
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
## 2021-11-21 ENCOUNTER — Encounter: Payer: Self-pay | Admitting: Internal Medicine

## 2021-11-21 ENCOUNTER — Ambulatory Visit (INDEPENDENT_AMBULATORY_CARE_PROVIDER_SITE_OTHER): Payer: Medicare Other | Admitting: Internal Medicine

## 2021-11-21 VITALS — BP 140/90 | HR 70 | Ht 71.0 in | Wt 339.6 lb

## 2021-11-21 DIAGNOSIS — Z6841 Body Mass Index (BMI) 40.0 and over, adult: Secondary | ICD-10-CM | POA: Diagnosis not present

## 2021-11-21 DIAGNOSIS — G4733 Obstructive sleep apnea (adult) (pediatric): Secondary | ICD-10-CM

## 2021-11-21 NOTE — Assessment & Plan Note (Signed)
This is a long-term problem.  Weight control is somewhat better when he can swim regularly.

## 2021-11-21 NOTE — Assessment & Plan Note (Signed)
Benefits from CPAP with continued good sleep, good compliance and control. Plan-reordering replacement machine with auto range 4-12

## 2021-11-21 NOTE — Patient Instructions (Signed)
Order- DME [facility]- please replace old CPAP machine auto 4-12, mask of choice, humidifier, supplies, airView/ card  Good luck with your foot !

## 2021-12-11 DIAGNOSIS — L0390 Cellulitis, unspecified: Secondary | ICD-10-CM | POA: Diagnosis not present

## 2021-12-11 DIAGNOSIS — Z9081 Acquired absence of spleen: Secondary | ICD-10-CM | POA: Diagnosis not present

## 2021-12-11 DIAGNOSIS — L97521 Non-pressure chronic ulcer of other part of left foot limited to breakdown of skin: Secondary | ICD-10-CM | POA: Diagnosis not present

## 2021-12-11 DIAGNOSIS — G603 Idiopathic progressive neuropathy: Secondary | ICD-10-CM | POA: Diagnosis not present

## 2021-12-16 ENCOUNTER — Telehealth: Payer: Self-pay | Admitting: Internal Medicine

## 2021-12-16 DIAGNOSIS — M86672 Other chronic osteomyelitis, left ankle and foot: Secondary | ICD-10-CM | POA: Diagnosis not present

## 2021-12-16 DIAGNOSIS — L97526 Non-pressure chronic ulcer of other part of left foot with bone involvement without evidence of necrosis: Secondary | ICD-10-CM | POA: Diagnosis not present

## 2021-12-17 NOTE — Telephone Encounter (Signed)
Called Dawn and [facility] and left voicemail for her that Dr Annamaria Boots did confirm cpap setting are to be auto 4-12. The order was placed 11/21/21 Nothing further needed

## 2021-12-17 NOTE — Telephone Encounter (Signed)
That was in my notes. Could you please let me know what [facility] has for settings in their records? thanks

## 2021-12-17 NOTE — Telephone Encounter (Signed)
[facility] is calling and wanting to confirm what settings are correct for this patient. The order that was laced on 11/21/2021 was Auto cpap 4-12.   Sir can you confirm these are the correct settings or not?  Please advise thank you

## 2021-12-19 DIAGNOSIS — Z9081 Acquired absence of spleen: Secondary | ICD-10-CM | POA: Diagnosis not present

## 2021-12-19 DIAGNOSIS — Z86718 Personal history of other venous thrombosis and embolism: Secondary | ICD-10-CM | POA: Diagnosis not present

## 2021-12-19 DIAGNOSIS — Z7901 Long term (current) use of anticoagulants: Secondary | ICD-10-CM | POA: Diagnosis not present

## 2021-12-19 DIAGNOSIS — Z885 Allergy status to narcotic agent status: Secondary | ICD-10-CM | POA: Diagnosis not present

## 2021-12-19 DIAGNOSIS — Z7985 Long-term (current) use of injectable non-insulin antidiabetic drugs: Secondary | ICD-10-CM | POA: Diagnosis not present

## 2021-12-19 DIAGNOSIS — M86672 Other chronic osteomyelitis, left ankle and foot: Secondary | ICD-10-CM | POA: Diagnosis not present

## 2021-12-19 DIAGNOSIS — Z86711 Personal history of pulmonary embolism: Secondary | ICD-10-CM | POA: Diagnosis not present

## 2021-12-19 DIAGNOSIS — G4730 Sleep apnea, unspecified: Secondary | ICD-10-CM | POA: Diagnosis not present

## 2021-12-19 DIAGNOSIS — Z6841 Body Mass Index (BMI) 40.0 and over, adult: Secondary | ICD-10-CM | POA: Diagnosis not present

## 2021-12-19 DIAGNOSIS — L98498 Non-pressure chronic ulcer of skin of other sites with other specified severity: Secondary | ICD-10-CM | POA: Diagnosis not present

## 2021-12-19 DIAGNOSIS — Z96652 Presence of left artificial knee joint: Secondary | ICD-10-CM | POA: Diagnosis not present

## 2021-12-19 DIAGNOSIS — Z79899 Other long term (current) drug therapy: Secondary | ICD-10-CM | POA: Diagnosis not present

## 2021-12-19 DIAGNOSIS — E11621 Type 2 diabetes mellitus with foot ulcer: Secondary | ICD-10-CM | POA: Diagnosis not present

## 2021-12-19 DIAGNOSIS — G4733 Obstructive sleep apnea (adult) (pediatric): Secondary | ICD-10-CM | POA: Diagnosis not present

## 2021-12-19 DIAGNOSIS — G603 Idiopathic progressive neuropathy: Secondary | ICD-10-CM | POA: Diagnosis not present

## 2021-12-19 DIAGNOSIS — I10 Essential (primary) hypertension: Secondary | ICD-10-CM | POA: Diagnosis not present

## 2021-12-19 DIAGNOSIS — M868X7 Other osteomyelitis, ankle and foot: Secondary | ICD-10-CM | POA: Diagnosis not present

## 2021-12-19 DIAGNOSIS — Z87891 Personal history of nicotine dependence: Secondary | ICD-10-CM | POA: Diagnosis not present

## 2021-12-19 DIAGNOSIS — K219 Gastro-esophageal reflux disease without esophagitis: Secondary | ICD-10-CM | POA: Diagnosis not present

## 2021-12-19 DIAGNOSIS — L97524 Non-pressure chronic ulcer of other part of left foot with necrosis of bone: Secondary | ICD-10-CM | POA: Diagnosis not present

## 2021-12-19 DIAGNOSIS — Z8572 Personal history of non-Hodgkin lymphomas: Secondary | ICD-10-CM | POA: Diagnosis not present

## 2021-12-20 DIAGNOSIS — Z8572 Personal history of non-Hodgkin lymphomas: Secondary | ICD-10-CM | POA: Diagnosis not present

## 2021-12-20 DIAGNOSIS — I10 Essential (primary) hypertension: Secondary | ICD-10-CM | POA: Diagnosis not present

## 2021-12-20 DIAGNOSIS — M86672 Other chronic osteomyelitis, left ankle and foot: Secondary | ICD-10-CM | POA: Diagnosis not present

## 2021-12-20 DIAGNOSIS — Z9081 Acquired absence of spleen: Secondary | ICD-10-CM | POA: Diagnosis not present

## 2021-12-20 DIAGNOSIS — E11621 Type 2 diabetes mellitus with foot ulcer: Secondary | ICD-10-CM | POA: Diagnosis not present

## 2021-12-20 DIAGNOSIS — G4733 Obstructive sleep apnea (adult) (pediatric): Secondary | ICD-10-CM | POA: Diagnosis not present

## 2021-12-20 DIAGNOSIS — L97524 Non-pressure chronic ulcer of other part of left foot with necrosis of bone: Secondary | ICD-10-CM | POA: Diagnosis not present

## 2021-12-20 DIAGNOSIS — G603 Idiopathic progressive neuropathy: Secondary | ICD-10-CM | POA: Diagnosis not present

## 2021-12-24 DIAGNOSIS — E11621 Type 2 diabetes mellitus with foot ulcer: Secondary | ICD-10-CM | POA: Diagnosis not present

## 2021-12-24 DIAGNOSIS — L97524 Non-pressure chronic ulcer of other part of left foot with necrosis of bone: Secondary | ICD-10-CM | POA: Diagnosis not present

## 2021-12-24 DIAGNOSIS — M86672 Other chronic osteomyelitis, left ankle and foot: Secondary | ICD-10-CM | POA: Diagnosis not present

## 2021-12-24 DIAGNOSIS — I10 Essential (primary) hypertension: Secondary | ICD-10-CM | POA: Diagnosis not present

## 2021-12-26 DIAGNOSIS — I872 Venous insufficiency (chronic) (peripheral): Secondary | ICD-10-CM | POA: Diagnosis not present

## 2021-12-26 DIAGNOSIS — M86672 Other chronic osteomyelitis, left ankle and foot: Secondary | ICD-10-CM | POA: Diagnosis not present

## 2021-12-26 DIAGNOSIS — G603 Idiopathic progressive neuropathy: Secondary | ICD-10-CM | POA: Diagnosis not present

## 2021-12-30 ENCOUNTER — Telehealth: Payer: Self-pay | Admitting: Internal Medicine

## 2021-12-30 DIAGNOSIS — G4733 Obstructive sleep apnea (adult) (pediatric): Secondary | ICD-10-CM

## 2022-01-01 DIAGNOSIS — S98112A Complete traumatic amputation of left great toe, initial encounter: Secondary | ICD-10-CM | POA: Diagnosis not present

## 2022-01-01 DIAGNOSIS — G603 Idiopathic progressive neuropathy: Secondary | ICD-10-CM | POA: Diagnosis not present

## 2022-01-01 DIAGNOSIS — M86672 Other chronic osteomyelitis, left ankle and foot: Secondary | ICD-10-CM | POA: Diagnosis not present

## 2022-01-01 NOTE — Telephone Encounter (Signed)
Order has been placed to [facility] for new settings. Nothing further needed.

## 2022-01-07 DIAGNOSIS — L03032 Cellulitis of left toe: Secondary | ICD-10-CM | POA: Diagnosis not present

## 2022-01-07 DIAGNOSIS — S90425A Blister (nonthermal), left lesser toe(s), initial encounter: Secondary | ICD-10-CM | POA: Diagnosis not present

## 2022-01-07 DIAGNOSIS — M79672 Pain in left foot: Secondary | ICD-10-CM | POA: Diagnosis not present

## 2022-01-10 DIAGNOSIS — Z23 Encounter for immunization: Secondary | ICD-10-CM | POA: Diagnosis not present

## 2022-01-14 DIAGNOSIS — M79672 Pain in left foot: Secondary | ICD-10-CM | POA: Diagnosis not present

## 2022-01-20 DIAGNOSIS — H401122 Primary open-angle glaucoma, left eye, moderate stage: Secondary | ICD-10-CM | POA: Diagnosis not present

## 2022-01-22 DIAGNOSIS — Z86718 Personal history of other venous thrombosis and embolism: Secondary | ICD-10-CM | POA: Diagnosis not present

## 2022-01-22 DIAGNOSIS — M79672 Pain in left foot: Secondary | ICD-10-CM | POA: Diagnosis not present

## 2022-01-22 DIAGNOSIS — S98112A Complete traumatic amputation of left great toe, initial encounter: Secondary | ICD-10-CM | POA: Diagnosis not present

## 2022-01-22 DIAGNOSIS — M79671 Pain in right foot: Secondary | ICD-10-CM | POA: Diagnosis not present

## 2022-01-22 DIAGNOSIS — M86672 Other chronic osteomyelitis, left ankle and foot: Secondary | ICD-10-CM | POA: Diagnosis not present

## 2022-01-22 DIAGNOSIS — M79674 Pain in right toe(s): Secondary | ICD-10-CM | POA: Diagnosis not present

## 2022-01-24 ENCOUNTER — Other Ambulatory Visit: Payer: Self-pay

## 2022-01-24 ENCOUNTER — Other Ambulatory Visit: Payer: Self-pay | Admitting: Physician Assistant

## 2022-01-24 DIAGNOSIS — M545 Low back pain, unspecified: Secondary | ICD-10-CM

## 2022-01-27 DIAGNOSIS — Z23 Encounter for immunization: Secondary | ICD-10-CM | POA: Diagnosis not present

## 2022-01-28 ENCOUNTER — Ambulatory Visit
Admission: RE | Admit: 2022-01-28 | Discharge: 2022-01-28 | Disposition: A | Discharge status: Home / Self Care | Payer: Medicare Other | Source: Ambulatory Visit | Attending: Orthopaedic Surgery | Admitting: Orthopaedic Surgery

## 2022-01-28 DIAGNOSIS — G8929 Other chronic pain: Secondary | ICD-10-CM

## 2022-01-28 DIAGNOSIS — M47817 Spondylosis without myelopathy or radiculopathy, lumbosacral region: Secondary | ICD-10-CM | POA: Diagnosis not present

## 2022-01-28 MED ORDER — IOPAMIDOL (ISOVUE-M 200) INJECTION 41%
1.0000 mL | Freq: Once | INTRAMUSCULAR | Status: AC
  Administered 2022-01-28: 1 mL via EPIDURAL

## 2022-01-28 MED ORDER — METHYLPREDNISOLONE ACETATE 40 MG/ML INJ SUSP (RADIOLOG
80.0000 mg | Freq: Once | INTRAMUSCULAR | Status: AC
  Administered 2022-01-28: 80 mg via EPIDURAL

## 2022-01-28 NOTE — Discharge Instructions (Signed)

## 2022-02-04 DIAGNOSIS — M79673 Pain in unspecified foot: Secondary | ICD-10-CM | POA: Diagnosis not present

## 2022-02-04 DIAGNOSIS — Z6841 Body Mass Index (BMI) 40.0 and over, adult: Secondary | ICD-10-CM | POA: Diagnosis not present

## 2022-02-18 ENCOUNTER — Encounter: Payer: Self-pay | Admitting: Orthopaedic Surgery

## 2022-02-18 ENCOUNTER — Ambulatory Visit (INDEPENDENT_AMBULATORY_CARE_PROVIDER_SITE_OTHER): Payer: Medicare Other | Admitting: Orthopaedic Surgery

## 2022-02-18 DIAGNOSIS — M79674 Pain in right toe(s): Secondary | ICD-10-CM

## 2022-02-18 DIAGNOSIS — M19171 Post-traumatic osteoarthritis, right ankle and foot: Secondary | ICD-10-CM | POA: Diagnosis not present

## 2022-02-18 MED ORDER — METHYLPREDNISOLONE ACETATE 80 MG/ML IJ SUSP
10.0000 mg | INTRAMUSCULAR | Status: AC | PRN
  Administered 2022-02-18: 10 mg via INTRA_ARTICULAR

## 2022-02-18 MED ORDER — LIDOCAINE HCL 1 % IJ SOLN
0.5000 mL | INTRAMUSCULAR | Status: AC | PRN
  Administered 2022-02-18: .5 mL

## 2022-02-18 MED ORDER — LIDOCAINE HCL 1 % IJ SOLN
2.0000 mL | INTRAMUSCULAR | Status: AC | PRN
  Administered 2022-02-18: 2 mL

## 2022-02-18 MED ORDER — METHYLPREDNISOLONE ACETATE 40 MG/ML IJ SUSP
40.0000 mg | INTRAMUSCULAR | Status: AC | PRN
  Administered 2022-02-18: 40 mg via INTRA_ARTICULAR

## 2022-02-18 NOTE — Progress Notes (Signed)
Office Visit Note   Patient: Marc Mills           Date of Birth: 10/02/1945           MRN: [id number] Visit Date: 02/18/2022              Requested by: Marc Huddle, MD [address] [place],  [place] [address] PCP: Marc Huddle, MD   Assessment & Plan: Visit Diagnoses:  1. Post-traumatic osteoarthritis, right ankle and foot   2. Toe pain, right     Plan: Marc Mills has an established diagnosis of posttraumatic osteoarthritis of the right ankle.  On occasion I have injected this with cortisone with good relief.  Marc Mills has had an exacerbation of her pain and will reinjected today.  Also having some chronic pain in the area of the metatarsal phalangeal joint of the little toe without history of injury or trauma.  Will inject that area as well  Follow-Up Instructions: Return if symptoms worsen or fail to improve.   Orders:  Orders Placed This Encounter  Procedures   Medium Joint Inj   Small Joint Inj   No orders of the defined types were placed in this encounter.     Procedures: Medium Joint Inj on 02/18/2022 4:43 PM Details: 25 G 1.5 in needle, dorsal approach Medications: 2 mL lidocaine 1 %; 40 mg methylPREDNISolone acetate 40 MG/ML  Injected right ankle dorsally and laterally without problem   Small Joint Inj on 02/18/2022 4:45 PM Details: 25 G needle, dorsal approach  Spinal Needle: No  Medications: 0.5 mL lidocaine 1 %; 10 mg methylPREDNISolone acetate 80 MG/ML  Injected metatarsal phalangeal joint little toe      Clinical Data: No additional findings.   Subjective: No chief complaint on file. Has experienced an exacerbation of right ankle pain.  Has a prior diagnosis of posttraumatic osteoarthritis.  Has done well with cortisone in the past.  Marc Mills would like another injection today.  Also has been experiencing some pain in the area of the right little toe near the metatarsal phalangeal joint.  There have been no skin changes and Marc Mills denies  any injury or trauma.  HPI  Review of Systems   Objective: Vital Signs: There were no vitals taken for this visit.  Physical Exam Constitutional:      Appearance: Marc Mills is well-developed.  Pulmonary:     Effort: Pulmonary effort is normal.  Skin:    General: Skin is warm and dry.  Neurological:     Mental Status: Marc Mills is alert and oriented to person, place, and time.  Psychiatric:        Behavior: Behavior normal.     Ortho Exam right ankle with some chronic hypertrophic and edema changes.  Limited dorsiflexion plantarflexion based on prior films consistent with posttraumatic osteoarthritis.  Most tender anteriorly and laterally.  Good capillary refill to toes.  Normal sensation.  Very minimal tenderness in the area of the little toe metatarsal phalangeal joint but no toe swelling or skin changes.  No plantar spurs.  Specialty Comments:  No specialty comments available.  Imaging: No results found.   PMFS History: Patient Active Problem List   Diagnosis Date Noted   Post-traumatic osteoarthritis, right ankle and foot 02/18/2022   Toe pain, right 02/18/2022   Essential hypertension 08/28/2021   Low back pain 06/27/2021   Acute embolism and thrombosis of unspecified deep veins of unspecified lower extremity ([place]) 06/03/2021   Anal fistula 06/03/2021  Anxiety 06/03/2021   Erectile dysfunction 06/03/2021   Essential tremor 06/03/2021   History of lymphoma 06/03/2021   Impacted cerumen 06/03/2021   Peripheral neuropathy 06/03/2021   Personal history of colonic polyps 06/03/2021   Rectal fistula 06/03/2021   Inflamed seborrheic keratosis 06/03/2021   Vitamin D deficiency 06/03/2021   Tremor 06/03/2021   Eustachian tube dysfunction, left 12/10/2020   Otalgia, left 12/10/2020   Nondisplaced fracture of distal phalanx of left middle finger, initial encounter for open fracture 09/18/2020   Pain in right hip 03/27/2020   Laceration of right ring finger 05/05/2019    Laceration of flexor muscle, fascia and tendon of right ring finger at wrist and hand level, initial encounter 04/23/2019   Unspecified fracture of the lower end of right radius, initial encounter for closed fracture 04/23/2019   Fracture of unspecified phalanx of right ring finger, initial encounter for open fracture 04/23/2019   Left knee pain 06/14/2018   Pain in right ankle and joints of right foot 12/18/2017   Bilateral edema of lower extremity 10/30/2015   Onychomycosis 10/30/2015   Sepsis ([place]) 08/31/2015   History of pulmonary embolism    History of DVT (deep vein thrombosis)    Pyrexia    Cellulitis 03/18/2015   Chronic anticoagulation 03/17/2015   Morbid obesity with BMI of 45.0-49.9, adult ([place]) 03/17/2015   S/P IVC filter 03/17/2015   Genetic testing 01/12/2015   Postoperative wound infection 12/09/2013   Postop check 11/24/2013   Ulcer of lower limb, unspecified 08/24/2013   Inguinal lymphadenopathy 08/16/2013   Encounter for therapeutic drug monitoring 06/09/2013   Left leg cellulitis 04/04/2013   Osteoarthritis of left knee 11/02/2012   Cellulitis of right lower extremity 04/30/2012   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 04/29/2012   S/P splenectomy 04/29/2012   Incisional hernia 05/27/2011   Splenic marginal zone b-cell lymphoma ([place]) 04/16/2010   Morbid obesity ([place]) 04/19/2008   Obstructive sleep apnea 04/19/2008   VARICOSE VEINS, LOWER EXTREMITIES 04/19/2008   PULMONARY EMBOLISM, HX OF 04/19/2008   Past Medical History:  Diagnosis Date   Anorectal fistula    Arthritis    Blood transfusion without reported diagnosis    Cancer ([place])    lymphoma hx   Cataract    Cellulitis 04/04/2013   DVT (deep venous thrombosis) ([place])    2011 right leg   History of lymphoma    History of pulmonary embolus (PE)    Incisional hernia    Morbid obesity (HCC)    OSA on CPAP    cpap  10 yrs   Varicose veins of lower extremities    pe    Family History  Problem Relation  Age of Onset   Heart disease Father    Varicose Veins Father    Heart attack Father        dx. 68s   Heart disease Mother 21   Varicose Veins Mother    Pancreatic cancer Sister 76   Cancer Maternal Grandmother        either stomach or pancreatic primary    Pancreatic cancer Maternal Grandfather        dx. 8s   Lung cancer Maternal Aunt        secondhand smoke exposure   Heart attack Paternal Uncle    Rectal cancer Maternal Aunt        dx. 52s   Cancer Maternal Aunt        unknown type   Diabetes Paternal Uncle  Cancer Cousin        unknown type   Breast cancer Cousin        dx. 73s    Past Surgical History:  Procedure Laterality Date   EYE SURGERY Left 03   cat, detached ret     FRACTURE SURGERY     HERNIA REPAIR     INGUINAL LYMPH NODE BIOPSY [name] 11/15/2013   Procedure: RIGHT INGUINAL LYMPH NODE BIOPSY;  Surgeon: Gwenyth Ober, MD;  Location: [facility];  Service: General;  Laterality: [name];   ivc filter     PROSTATE SURGERY     (patient denies)   SPLENECTOMY     TONSILLECTOMY     age 61   TOTAL KNEE ARTHROPLASTY Left 11/02/2012   Procedure: TOTAL KNEE ARTHROPLASTY with revision tibia;  Surgeon: Garald Balding, MD;  Location: [facility];  Service: Orthopedics;  Laterality: Left;   Social History   Occupational History   Not on file  Tobacco Use   Smoking status: Former    Packs/day: 2.00    Years: 20.00    Total pack years: 40.00    Types: Cigarettes    Quit date: 05/05/1984    Years since quitting: 37.8   Smokeless tobacco: Never  Vaping Use   Vaping Use: Never used  Substance and Sexual Activity   Alcohol use: Yes    Alcohol/week: 0.0 standard drinks of alcohol    Comment: maybe 1 beer/month   Drug use: No   Sexual activity: Yes    Birth control/protection: None     Garald Balding, MD   Note - This record has been created using Editor, commissioning.  Chart creation errors have been sought, but may not always  have been located. Such creation errors do not  reflect on  the standard of medical care.

## 2022-03-10 DIAGNOSIS — B351 Tinea unguium: Secondary | ICD-10-CM | POA: Diagnosis not present

## 2022-03-10 DIAGNOSIS — Z89412 Acquired absence of left great toe: Secondary | ICD-10-CM | POA: Diagnosis not present

## 2022-03-10 DIAGNOSIS — G603 Idiopathic progressive neuropathy: Secondary | ICD-10-CM | POA: Diagnosis not present

## 2022-03-10 DIAGNOSIS — M25572 Pain in left ankle and joints of left foot: Secondary | ICD-10-CM | POA: Diagnosis not present

## 2022-04-08 DIAGNOSIS — S98112A Complete traumatic amputation of left great toe, initial encounter: Secondary | ICD-10-CM | POA: Diagnosis not present

## 2022-04-08 DIAGNOSIS — M19072 Primary osteoarthritis, left ankle and foot: Secondary | ICD-10-CM | POA: Diagnosis not present

## 2022-04-08 DIAGNOSIS — G603 Idiopathic progressive neuropathy: Secondary | ICD-10-CM | POA: Diagnosis not present

## 2022-04-08 DIAGNOSIS — M79672 Pain in left foot: Secondary | ICD-10-CM | POA: Diagnosis not present

## 2022-04-22 ENCOUNTER — Ambulatory Visit: Payer: Medicare Other | Admitting: Internal Medicine

## 2022-05-08 ENCOUNTER — Encounter: Payer: Self-pay | Admitting: Orthopaedic Surgery

## 2022-05-08 ENCOUNTER — Ambulatory Visit (INDEPENDENT_AMBULATORY_CARE_PROVIDER_SITE_OTHER): Payer: Medicare Other | Admitting: Orthopaedic Surgery

## 2022-05-08 ENCOUNTER — Encounter: Payer: Self-pay | Admitting: Oncology

## 2022-05-08 DIAGNOSIS — M25571 Pain in right ankle and joints of right foot: Secondary | ICD-10-CM | POA: Diagnosis not present

## 2022-05-08 DIAGNOSIS — M19171 Post-traumatic osteoarthritis, right ankle and foot: Secondary | ICD-10-CM | POA: Diagnosis not present

## 2022-05-08 MED ORDER — METHYLPREDNISOLONE ACETATE 40 MG/ML IJ SUSP
20.0000 mg | INTRAMUSCULAR | Status: AC | PRN
  Administered 2022-05-08: 20 mg via INTRA_ARTICULAR

## 2022-05-08 MED ORDER — LIDOCAINE HCL 1 % IJ SOLN
2.0000 mL | INTRAMUSCULAR | Status: AC | PRN
  Administered 2022-05-08: 2 mL

## 2022-05-08 NOTE — Progress Notes (Signed)
Office Visit Note   Patient: Marc Mills           Date of Birth: [birth year]           MRN: [id number] Visit Date: 05/08/2022              Requested by: Josetta Huddle, MD [address] [place],   [address] PCP: Josetta Huddle, MD   Assessment & Plan: Visit Diagnoses:  1. Post-traumatic osteoarthritis, right ankle and foot   2. Pain in right ankle and joints of right foot     Plan: Marc Mills is seen today for evaluation of several problems.  He has arthritis in the left midfoot and does have discomfort dorsally particularly when he is on his feet for length of time.  He has had a recent excision of his great toe for infection and has comfortable shoes with good inserts but still having some trouble.  I am referring him to Dr. Sharol Given for further evaluation.  He also has posttraumatic osteoarthritis of his right ankle and on occasion I have injected the ankle with good relief.  I am going to reinject his ankle today.  He continues to lose weight and is lost over a period of time probably 35 or more pounds he remains active with swimming  Follow-Up Instructions: Return Refer to Dr. Sharol Given for left foot pain.   Orders:  Orders Placed This Encounter  Procedures   Ambulatory referral to Orthopedic Surgery   No orders of the defined types were placed in this encounter.     Procedures: Medium Joint Inj on 05/08/2022 3:19 PM Details: 25 G needle, anterolateral approach Medications: 2 mL lidocaine 1 %; 20 mg methylPREDNISolone acetate 40 MG/ML      Clinical Data: No additional findings.   Subjective: Chief Complaint  Patient presents with   Right Ankle - Pain  Patient presents today for chronic right ankle pain. He received an injection on 02/18/2022. He wants to get his right ankle injected again today.  In addition he has had some trouble with his left foot.  Has had a recent excision of the great toe for osteomyelitis and is had exacerbation of the pain in  the midfoot where he is evidence of osteoarthritis by old films.  He wears good comfortable inserts and shoes will has difficulty when is on his feet.  He seems to do fine when he does not have shoes  HPI  Review of Systems   Objective: Vital Signs: There were no vitals taken for this visit.  Physical Exam Constitutional:      Appearance: He is well-developed.  Eyes:     Pupils: Pupils are equal, round, and reactive to light.  Pulmonary:     Effort: Pulmonary effort is normal.  Skin:    General: Skin is warm and dry.  Neurological:     Mental Status: He is alert and oriented to person, place, and time.  Psychiatric:        Behavior: Behavior normal.     Ortho Exam awake alert and oriented x 3.  Comfortable sitting.  Limited range of motion of right ankle with some discomfort anterior medially and anterior laterally.  +1 pulses good capillary refill to toes.  Multiple varicosities about the foot  Left foot has surgical absence of the great toe.  There are some osteophytes in the midfoot dorsally with some tenderness.  Good capillary refill to the lateral 4 toes +1 pulses.  He does  pronate with weightbearing.  No ankle pain or evidence of instability.  Specialty Comments:  No specialty comments available.  Imaging: No results found.   PMFS History: Patient Active Problem List   Diagnosis Date Noted   Post-traumatic osteoarthritis, right ankle and foot 02/18/2022   Toe pain, right 02/18/2022   Essential hypertension 08/28/2021   Low back pain 06/27/2021   Acute embolism and thrombosis of unspecified deep veins of unspecified lower extremity ([place]) 06/03/2021   Anal fistula 06/03/2021   Anxiety 06/03/2021   Erectile dysfunction 06/03/2021   Essential tremor 06/03/2021   History of lymphoma 06/03/2021   Impacted cerumen 06/03/2021   Peripheral neuropathy 06/03/2021   Personal history of colonic polyps 06/03/2021   Rectal fistula 06/03/2021   Inflamed seborrheic  keratosis 06/03/2021   Vitamin D deficiency 06/03/2021   Tremor 06/03/2021   Eustachian tube dysfunction, left 12/10/2020   Otalgia, left 12/10/2020   Nondisplaced fracture of distal phalanx of left middle finger, initial encounter for open fracture 09/18/2020   Pain in right hip 03/27/2020   Laceration of right ring finger 05/05/2019   Laceration of flexor muscle, fascia and tendon of right ring finger at wrist and hand level, initial encounter 04/23/2019   Unspecified fracture of the lower end of right radius, initial encounter for closed fracture 04/23/2019   Fracture of unspecified phalanx of right ring finger, initial encounter for open fracture 04/23/2019   Left knee pain 06/14/2018   Pain in left ankle and joints of left foot 12/18/2017   Bilateral edema of lower extremity 10/30/2015   Onychomycosis 10/30/2015   Sepsis ([place]) 08/31/2015   History of pulmonary embolism    History of DVT (deep vein thrombosis)    Pyrexia    Cellulitis 03/18/2015   Chronic anticoagulation 03/17/2015   Morbid obesity with BMI of 45.0-49.9, adult ([place]) 03/17/2015   S/P IVC filter 03/17/2015   Genetic testing 01/12/2015   Postoperative wound infection 12/09/2013   Postop check 11/24/2013   Ulcer of lower limb, unspecified 08/24/2013   Inguinal lymphadenopathy 08/16/2013   Encounter for therapeutic drug monitoring 06/09/2013   Left leg cellulitis 04/04/2013   Osteoarthritis of left knee 11/02/2012   Cellulitis of right lower extremity 04/30/2012   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 04/29/2012   S/P splenectomy 04/29/2012   Incisional hernia 05/27/2011   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/2010   Morbid obesity ([place]) 04/19/2008   Obstructive sleep apnea 04/19/2008   VARICOSE VEINS, LOWER EXTREMITIES 04/19/2008   PULMONARY EMBOLISM, HX OF 04/19/2008   Past Medical History:  Diagnosis Date   Anorectal fistula    Arthritis    Blood transfusion without reported diagnosis     Cancer ([facility])    lymphoma hx   Cataract    Cellulitis 04/04/2013   DVT (deep venous thrombosis) ([place])    2011 right leg   History of lymphoma    History of pulmonary embolus ([facility])    Incisional hernia    Morbid obesity (HCC)    OSA on CPAP    cpap  10 yrs   Varicose veins of lower extremities    pe    Family History  Problem Relation Age of Onset   Heart disease Father    Varicose Veins Father    Heart attack Father        dx. 17s   Heart disease Mother 31   Varicose Veins Mother    Pancreatic cancer Sister 73   Cancer Maternal Grandmother  either stomach or pancreatic primary    Pancreatic cancer Maternal Grandfather        dx. 13s   Lung cancer Maternal Aunt        secondhand smoke exposure   Heart attack Paternal Uncle    Rectal cancer Maternal Aunt        dx. 69s   Cancer Maternal Aunt        unknown type   Diabetes Paternal Uncle    Cancer Cousin        unknown type   Breast cancer Cousin        dx. 49s    Past Surgical History:  Procedure Laterality Date   EYE SURGERY Left 03   cat, detached ret     FRACTURE SURGERY     HERNIA REPAIR     INGUINAL LYMPH NODE BIOPSY [name] 11/15/2013   Procedure: RIGHT INGUINAL LYMPH NODE BIOPSY;  Surgeon: Gwenyth Ober, MD;  Location: [facility];  Service: General;  Laterality: [name];   ivc filter     PROSTATE SURGERY     (patient denies)   SPLENECTOMY     TONSILLECTOMY     age 46   TOTAL KNEE ARTHROPLASTY Left 11/02/2012   Procedure: TOTAL KNEE ARTHROPLASTY with revision tibia;  Surgeon: Garald Balding, MD;  Location: [facility];  Service: Orthopedics;  Laterality: Left;   Social History   Occupational History   Not on file  Tobacco Use   Smoking status: Former    Packs/day: 2.00    Years: 20.00    Total pack years: 40.00    Types: Cigarettes    Quit date: 05/05/1984    Years since quitting: 38.0   Smokeless tobacco: Never  Vaping Use   Vaping Use: Never used  Substance and Sexual Activity   Alcohol use: Yes     Alcohol/week: 0.0 standard drinks of alcohol    Comment: maybe 1 beer/month   Drug use: No   Sexual activity: Yes    Birth control/protection: None

## 2022-05-13 ENCOUNTER — Telehealth: Payer: Self-pay | Admitting: Physician Assistant

## 2022-05-13 ENCOUNTER — Other Ambulatory Visit: Payer: Self-pay

## 2022-05-13 DIAGNOSIS — M545 Low back pain, unspecified: Secondary | ICD-10-CM

## 2022-05-13 NOTE — Telephone Encounter (Signed)
Pt called requesting a call back from PA Persons. He is asking for injection in his back. Please call pt at [phone]

## 2022-05-13 NOTE — Telephone Encounter (Signed)
Referral placed.

## 2022-05-14 ENCOUNTER — Encounter: Payer: Self-pay | Admitting: Internal Medicine

## 2022-05-21 ENCOUNTER — Ambulatory Visit
Admission: RE | Admit: 2022-05-21 | Discharge: 2022-05-21 | Disposition: A | Discharge status: Home / Self Care | Payer: Medicare Other | Source: Ambulatory Visit | Attending: Physician Assistant | Admitting: Physician Assistant

## 2022-05-21 DIAGNOSIS — M79672 Pain in left foot: Secondary | ICD-10-CM | POA: Diagnosis not present

## 2022-05-21 DIAGNOSIS — M545 Low back pain, unspecified: Secondary | ICD-10-CM

## 2022-05-21 DIAGNOSIS — Z6841 Body Mass Index (BMI) 40.0 and over, adult: Secondary | ICD-10-CM | POA: Diagnosis not present

## 2022-05-21 DIAGNOSIS — M47817 Spondylosis without myelopathy or radiculopathy, lumbosacral region: Secondary | ICD-10-CM | POA: Diagnosis not present

## 2022-05-21 DIAGNOSIS — E669 Obesity, unspecified: Secondary | ICD-10-CM | POA: Diagnosis not present

## 2022-05-21 MED ORDER — METHYLPREDNISOLONE ACETATE 40 MG/ML INJ SUSP (RADIOLOG
80.0000 mg | Freq: Once | INTRAMUSCULAR | Status: AC
  Administered 2022-05-21: 80 mg via EPIDURAL

## 2022-05-21 MED ORDER — IOPAMIDOL (ISOVUE-M 200) INJECTION 41%
1.0000 mL | Freq: Once | INTRAMUSCULAR | Status: AC
  Administered 2022-05-21: 1 mL via EPIDURAL

## 2022-05-21 NOTE — Discharge Instructions (Signed)

## 2022-05-26 ENCOUNTER — Encounter: Payer: Self-pay | Admitting: Orthopedic Surgery

## 2022-05-26 ENCOUNTER — Ambulatory Visit (INDEPENDENT_AMBULATORY_CARE_PROVIDER_SITE_OTHER): Payer: Medicare Other | Admitting: Orthopedic Surgery

## 2022-05-26 DIAGNOSIS — M79672 Pain in left foot: Secondary | ICD-10-CM

## 2022-05-26 DIAGNOSIS — M6702 Short Achilles tendon (acquired), left ankle: Secondary | ICD-10-CM | POA: Diagnosis not present

## 2022-06-03 ENCOUNTER — Encounter: Payer: Self-pay | Admitting: Orthopedic Surgery

## 2022-06-03 NOTE — Progress Notes (Signed)
Office Visit Note   Patient: Marc Mills           Date of Birth: 02-Mar-1946           MRN: [id number] Visit Date: 05/26/2022              Requested by: Garald Balding, MD No address on file PCP: Josetta Huddle, MD  Chief Complaint  Patient presents with   Left Foot - Pain      HPI: Patient is a 77 year old gentleman who is seen for initial evaluation referral from Dr. Durward Fortes for left foot pain across the midfoot.  Patient is status post great toe amputation.  Also complains of clawing of the lesser toes.  Assessment & Plan: Visit Diagnoses:  1. Pain in left foot   2. Achilles tendon contracture, left     Plan: Recommended Achilles stretching to further offload the midfoot continue with his carbon plate and SAS shoes.  Follow-Up Instructions: No follow-ups on file.   Ortho Exam  Patient is alert, oriented, no adenopathy, well-dressed, normal affect, normal respiratory effort. Examination patient does have varicose veins he states he normally wears compression socks.  He is status post a great toe amputation that is well-healed.  He has clawing of the left second toe no calluses or ulcers.  Patient has pain across the midfoot.  Review of the radiographs shows osteoarthritis across the midfoot  Patient does have Achilles tightness with dorsiflexion only to neutral with his knee extended.  There is decreased subtalar motion.  Radiographs show a congruent joint.  Patient has a palpable dorsalis pedis and posterior tibial pulse.  Imaging: No results found. No images are attached to the encounter.  Labs: Lab Results  Component Value Date   HGBA1C 5.6 08/29/2021   HGBA1C 5.8 (H) 09/01/2015   HGBA1C 5.8 (H) 03/19/2015   ESRSEDRATE 20 (H) 08/29/2021   ESRSEDRATE 1 04/08/2010   CRP 1.3 (H) 08/29/2021   LABURIC 4.8 12/03/2012   LABURIC 5.4 09/16/2012   LABURIC 6.1 09/03/2012   REPTSTATUS 09/02/2021 FINAL 08/28/2021   GRAMSTAIN No WBC Seen 07/27/2013    GRAMSTAIN No Squamous Epithelial Cells Seen 07/27/2013   GRAMSTAIN Few GRAM POSITIVE COCCI IN PAIRS 07/27/2013   CULT  08/28/2021    NO GROWTH 5 DAYS Performed at [facility], [place] [address], [place], [place] [address]    LABORGA NO GROWTH 03/28/2015     Lab Results  Component Value Date   ALBUMIN 3.3 (L) 08/29/2021   ALBUMIN 3.6 08/28/2021   ALBUMIN 4.0 09/12/2016   PREALBUMIN 21.4 08/29/2021    Lab Results  Component Value Date   MG 1.8 09/01/2015   No results found for: "VD25OH"  Lab Results  Component Value Date   PREALBUMIN 21.4 08/29/2021      Latest Ref Rng & Units 08/29/2021    5:30 AM 08/28/2021    2:28 PM 09/12/2016    9:54 AM  CBC EXTENDED  WBC 4.0 - 10.5 K/uL 8.3  8.3  6.0   RBC 4.22 - 5.81 MIL/uL 4.38  4.41  4.73   Hemoglobin 13.0 - 17.0 g/dL 13.9  13.7  14.1   HCT 39.0 - 52.0 % 41.6  42.4  43.4   Platelets 150 - 400 K/uL 304  323  307   NEUT# 1.7 - 7.7 K/uL  6.0  3.6   Lymph# 0.7 - 4.0 K/uL  0.9  1.0      There is no height  or weight on file to calculate BMI.  Orders:  No orders of the defined types were placed in this encounter.  No orders of the defined types were placed in this encounter.    Procedures: No procedures performed  Clinical Data: No additional findings.  ROS:  All other systems negative, except as noted in the HPI. Review of Systems  Objective: Vital Signs: There were no vitals taken for this visit.  Specialty Comments:  No specialty comments available.  PMFS History: Patient Active Problem List   Diagnosis Date Noted   Post-traumatic osteoarthritis, right ankle and foot 02/18/2022   Toe pain, right 02/18/2022   Essential hypertension 08/28/2021   Low back pain 06/27/2021   Acute embolism and thrombosis of unspecified deep veins of unspecified lower extremity ([place]) 06/03/2021   Anal fistula 06/03/2021   Anxiety 06/03/2021   Erectile dysfunction 06/03/2021   Essential tremor 06/03/2021   History of  lymphoma 06/03/2021   Impacted cerumen 06/03/2021   Peripheral neuropathy 06/03/2021   Personal history of colonic polyps 06/03/2021   Rectal fistula 06/03/2021   Inflamed seborrheic keratosis 06/03/2021   Vitamin D deficiency 06/03/2021   Tremor 06/03/2021   Eustachian tube dysfunction, left 12/10/2020   Otalgia, left 12/10/2020   Nondisplaced fracture of distal phalanx of left middle finger, initial encounter for open fracture 09/18/2020   Pain in right hip 03/27/2020   Laceration of right ring finger 05/05/2019   Laceration of flexor muscle, fascia and tendon of right ring finger at wrist and hand level, initial encounter 04/23/2019   Unspecified fracture of the lower end of right radius, initial encounter for closed fracture 04/23/2019   Fracture of unspecified phalanx of right ring finger, initial encounter for open fracture 04/23/2019   Left knee pain 06/14/2018   Pain in left ankle and joints of left foot 12/18/2017   Bilateral edema of lower extremity 10/30/2015   Onychomycosis 10/30/2015   Sepsis ([place]) 08/31/2015   History of pulmonary embolism    History of DVT (deep vein thrombosis)    Pyrexia    Cellulitis 03/18/2015   Chronic anticoagulation 03/17/2015   Morbid obesity with BMI of 45.0-49.9, adult ([place]) 03/17/2015   S/P IVC filter 03/17/2015   Genetic testing 01/12/2015   Postoperative wound infection 12/09/2013   Postop check 11/24/2013   Ulcer of lower limb, unspecified 08/24/2013   Inguinal lymphadenopathy 08/16/2013   Encounter for therapeutic drug monitoring 06/09/2013   Left leg cellulitis 04/04/2013   Osteoarthritis of left knee 11/02/2012   Cellulitis of right lower extremity 04/30/2012   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 04/29/2012   S/P splenectomy 04/29/2012   Incisional hernia 05/27/2011   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   Morbid obesity ([place]) 04/19/2008   Obstructive sleep apnea 04/19/2008   VARICOSE VEINS, LOWER EXTREMITIES  04/19/2008   PULMONARY EMBOLISM, HX OF 04/19/2008   Past Medical History:  Diagnosis Date   Anorectal fistula    Arthritis    Blood transfusion without reported diagnosis    Cancer ([place])    lymphoma hx   Cataract    Cellulitis 04/04/2013   DVT (deep venous thrombosis) ([place])    2011 right leg   History of lymphoma    History of pulmonary embolus ([facility])    Incisional hernia    Morbid obesity (HCC)    OSA on CPAP    cpap  10 yrs   Varicose veins of lower extremities    pe    Family History  Problem Relation Age of Onset   Heart disease Father    Varicose Veins Father    Heart attack Father        dx. 27s   Heart disease Mother 68   Varicose Veins Mother    Pancreatic cancer Sister 8   Cancer Maternal Grandmother        either stomach or pancreatic primary    Pancreatic cancer Maternal Grandfather        dx. 3s   Lung cancer Maternal Aunt        secondhand smoke exposure   Heart attack Paternal Uncle    Rectal cancer Maternal Aunt        dx. 49s   Cancer Maternal Aunt        unknown type   Diabetes Paternal Uncle    Cancer Cousin        unknown type   Breast cancer Cousin        dx. 27s    Past Surgical History:  Procedure Laterality Date   EYE SURGERY Left 03   cat, detached ret     FRACTURE SURGERY     HERNIA REPAIR     INGUINAL LYMPH NODE BIOPSY [name] 11/15/2013   Procedure: RIGHT INGUINAL LYMPH NODE BIOPSY;  Surgeon: Gwenyth Ober, MD;  Location: [facility];  Service: General;  Laterality: [name];   ivc filter     PROSTATE SURGERY     (patient denies)   SPLENECTOMY     TONSILLECTOMY     age 31   TOTAL KNEE ARTHROPLASTY Left 11/02/2012   Procedure: TOTAL KNEE ARTHROPLASTY with revision tibia;  Surgeon: Garald Balding, MD;  Location: [facility];  Service: Orthopedics;  Laterality: Left;   Social History   Occupational History   Not on file  Tobacco Use   Smoking status: Former    Packs/day: 2.00    Years: 20.00    Total pack years: 40.00    Types: Cigarettes     Quit date: 05/05/1984    Years since quitting: 38.1   Smokeless tobacco: Never  Vaping Use   Vaping Use: Never used  Substance and Sexual Activity   Alcohol use: Yes    Alcohol/week: 0.0 standard drinks of alcohol    Comment: maybe 1 beer/month   Drug use: No   Sexual activity: Yes    Birth control/protection: None

## 2022-06-06 ENCOUNTER — Encounter: Payer: Self-pay | Admitting: Physician Assistant

## 2022-06-06 ENCOUNTER — Ambulatory Visit (INDEPENDENT_AMBULATORY_CARE_PROVIDER_SITE_OTHER): Payer: Medicare Other | Admitting: Physician Assistant

## 2022-06-06 ENCOUNTER — Ambulatory Visit (INDEPENDENT_AMBULATORY_CARE_PROVIDER_SITE_OTHER): Payer: Medicare Other

## 2022-06-06 DIAGNOSIS — G8929 Other chronic pain: Secondary | ICD-10-CM

## 2022-06-06 DIAGNOSIS — M25561 Pain in right knee: Secondary | ICD-10-CM

## 2022-06-06 MED ORDER — LIDOCAINE HCL 1 % IJ SOLN
2.0000 mL | INTRAMUSCULAR | Status: AC | PRN
  Administered 2022-06-06: 2 mL

## 2022-06-06 MED ORDER — METHYLPREDNISOLONE ACETATE 40 MG/ML IJ SUSP
80.0000 mg | INTRAMUSCULAR | Status: AC | PRN
  Administered 2022-06-06: 80 mg via INTRA_ARTICULAR

## 2022-06-06 MED ORDER — BUPIVACAINE HCL 0.25 % IJ SOLN
2.0000 mL | INTRAMUSCULAR | Status: AC | PRN
  Administered 2022-06-06: 2 mL via INTRA_ARTICULAR

## 2022-06-06 NOTE — Progress Notes (Signed)
Office Visit Note   Patient: Marc Mills           Date of Birth: 1945/12/02           MRN: [id number] Visit Date: 06/06/2022              Requested by: Josetta Huddle, MD [address] [place],  [place] [address] PCP: Josetta Huddle, MD  Chief Complaint  Patient presents with  . Right Knee - Pain      HPI: Marc Mills is a pleasant 77 year old gentleman who is a longtime patient of Dr. Durward Fortes.  He is status post left knee replacement.  He also has arthritis in his right knee and periodically gets an injection for this.  He denies any injury but is had some ongoing right knee pain.  His wife is due to undergo knee replacement in a month or 2 with Dr. Ninfa Linden so he does not want to pursue any significant treatment on his right knee at this time.  Wondering if an injection might help  Assessment & Plan: Visit Diagnoses:  1. Chronic pain of right knee     Plan: Will go forward with an injection today.  He does occasionally have some pain in his left knee replacement.  Do not see any acute abnormalities on the AP view that was taken for his right.  Of course if he had increasing problems he can discuss this with Dr. Ninfa Linden.  Could consider a bone scan.  However I see no signs of any concerns clinically at this time  Follow-Up Instructions: No follow-ups on file.   Ortho Exam  Patient is alert, oriented, no adenopathy, well-dressed, normal affect, normal respiratory effort. Examination of his right knee no effusion no erythema compartments are soft nontender he is neurovascular intact no redness or evidence of her infective process  Imaging: No results found. No images are attached to the encounter.  Labs: Lab Results  Component Value Date   HGBA1C 5.6 08/29/2021   HGBA1C 5.8 (H) 09/01/2015   HGBA1C 5.8 (H) 03/19/2015   ESRSEDRATE 20 (H) 08/29/2021   ESRSEDRATE 1 04/08/2010   CRP 1.3 (H) 08/29/2021   LABURIC 4.8 12/03/2012   LABURIC 5.4  09/16/2012   LABURIC 6.1 09/03/2012   REPTSTATUS 09/02/2021 FINAL 08/28/2021   GRAMSTAIN No WBC Seen 07/27/2013   GRAMSTAIN No Squamous Epithelial Cells Seen 07/27/2013   GRAMSTAIN Few GRAM POSITIVE COCCI IN PAIRS 07/27/2013   CULT  08/28/2021    NO GROWTH 5 DAYS Performed at [facility], [place] [address] [place], [place] [address]    LABORGA NO GROWTH 03/28/2015     Lab Results  Component Value Date   ALBUMIN 3.3 (L) 08/29/2021   ALBUMIN 3.6 08/28/2021   ALBUMIN 4.0 09/12/2016   PREALBUMIN 21.4 08/29/2021    Lab Results  Component Value Date   MG 1.8 09/01/2015   No results found for: "VD25OH"  Lab Results  Component Value Date   PREALBUMIN 21.4 08/29/2021      Latest Ref Rng & Units 08/29/2021    5:30 AM 08/28/2021    2:28 PM 09/12/2016    9:54 AM  CBC EXTENDED  WBC 4.0 - 10.5 K/uL 8.3  8.3  6.0   RBC 4.22 - 5.81 MIL/uL 4.38  4.41  4.73   Hemoglobin 13.0 - 17.0 g/dL 13.9  13.7  14.1   HCT 39.0 - 52.0 % 41.6  42.4  43.4   Platelets 150 -  400 K/uL 304  323  307   NEUT# 1.7 - 7.7 K/uL  6.0  3.6   Lymph# 0.7 - 4.0 K/uL  0.9  1.0      There is no height or weight on file to calculate BMI.  Orders:  Orders Placed This Encounter  Procedures  . XR Knee 1-2 Views Right   No orders of the defined types were placed in this encounter.    Procedures: Large Joint Inj: R knee on 06/06/2022 9:48 AM Indications: pain and diagnostic evaluation Details: 25 G 1.5 in needle, anteromedial approach  Arthrogram: No  Medications: 80 mg methylPREDNISolone acetate 40 MG/ML; 2 mL lidocaine 1 %; 2 mL bupivacaine 0.25 % Outcome: tolerated well, no immediate complications Procedure, treatment alternatives, risks and benefits explained, specific risks discussed. Consent was given by the patient.    Clinical Data: No additional findings.  ROS:  All other systems negative, except as noted in the HPI. Review of Systems  Objective: Vital Signs: There were no vitals taken  for this visit.  Specialty Comments:  No specialty comments available.  PMFS History: Patient Active Problem List   Diagnosis Date Noted  . Post-traumatic osteoarthritis, right ankle and foot 02/18/2022  . Toe pain, right 02/18/2022  . Essential hypertension 08/28/2021  . Low back pain 06/27/2021  . Acute embolism and thrombosis of unspecified deep veins of unspecified lower extremity ([place]) 06/03/2021  . Anal fistula 06/03/2021  . Anxiety 06/03/2021  . Erectile dysfunction 06/03/2021  . Essential tremor 06/03/2021  . History of lymphoma 06/03/2021  . Impacted cerumen 06/03/2021  . Peripheral neuropathy 06/03/2021  . Personal history of colonic polyps 06/03/2021  . Rectal fistula 06/03/2021  . Inflamed seborrheic keratosis 06/03/2021  . Vitamin D deficiency 06/03/2021  . Tremor 06/03/2021  . Eustachian tube dysfunction, left 12/10/2020  . Otalgia, left 12/10/2020  . Nondisplaced fracture of distal phalanx of left middle finger, initial encounter for open fracture 09/18/2020  . Pain in right hip 03/27/2020  . Laceration of right ring finger 05/05/2019  . Laceration of flexor muscle, fascia and tendon of right ring finger at wrist and hand level, initial encounter 04/23/2019  . Unspecified fracture of the lower end of right radius, initial encounter for closed fracture 04/23/2019  . Fracture of unspecified phalanx of right ring finger, initial encounter for open fracture 04/23/2019  . Left knee pain 06/14/2018  . Pain in left ankle and joints of left foot 12/18/2017  . Bilateral edema of lower extremity 10/30/2015  . Onychomycosis 10/30/2015  . Sepsis ([place]) 08/31/2015  . History of pulmonary embolism   . History of DVT (deep vein thrombosis)   . Pyrexia   . Cellulitis 03/18/2015  . Chronic anticoagulation 03/17/2015  . Morbid obesity with BMI of 45.0-49.9, adult ([place]) 03/17/2015  . S/P IVC filter 03/17/2015  . Genetic testing 01/12/2015  . Postoperative wound infection  12/09/2013  . Postop check 11/24/2013  . Ulcer of lower limb, unspecified 08/24/2013  . Inguinal lymphadenopathy 08/16/2013  . Encounter for therapeutic drug monitoring 06/09/2013  . Left leg cellulitis 04/04/2013  . Osteoarthritis of left knee 11/02/2012  . Cellulitis of right lower extremity 04/30/2012  . 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 S/P splenectomy 04/29/2012  . Incisional hernia 05/27/2011  . Splenic marginal zone b-cell lymphoma ([place]) 04/16/2010  . Morbid obesity ([place]) 04/19/2008  . Obstructive sleep apnea 04/19/2008  . VARICOSE VEINS, LOWER EXTREMITIES 04/19/2008  . PULMONARY EMBOLISM, HX OF 04/19/2008   Past  Medical History:  Diagnosis Date  . Anorectal fistula   . Arthritis   . Blood transfusion without reported diagnosis   . Cancer (HCC)    lymphoma hx  . Cataract   . Cellulitis 04/04/2013  . DVT (deep venous thrombosis) ([place])    2011 right leg  . History of lymphoma   . History of pulmonary embolus (PE)   . Incisional hernia   . Morbid obesity ([place])   . OSA on CPAP    cpap  10 yrs  . Varicose veins of lower extremities    pe    Family History  Problem Relation Age of Onset  . Heart disease Father   . Varicose Veins Father   . Heart attack Father        dx. 45s  . Heart disease Mother 66  . Varicose Veins Mother   . Pancreatic cancer Sister 60  . Cancer Maternal Grandmother        either stomach or pancreatic primary   . Pancreatic cancer Maternal Grandfather        dx. 50s  . Lung cancer Maternal Aunt        secondhand smoke exposure  . Heart attack Paternal Uncle   . Rectal cancer Maternal Aunt        dx. 48s  . Cancer Maternal Aunt        unknown type  . Diabetes Paternal Uncle   . Cancer Cousin        unknown type  . Breast cancer Cousin        dx. 63s    Past Surgical History:  Procedure Laterality Date  . EYE SURGERY Left 03   cat, detached ret    . FRACTURE SURGERY    . HERNIA REPAIR    . INGUINAL LYMPH NODE  BIOPSY [name] 11/15/2013   Procedure: RIGHT INGUINAL LYMPH NODE BIOPSY;  Surgeon: Gwenyth Ober, MD;  Location: [facility];  Service: General;  Laterality: [name];  . ivc filter    . PROSTATE SURGERY     (patient denies)  . SPLENECTOMY    . TONSILLECTOMY     age 84  . TOTAL KNEE ARTHROPLASTY Left 11/02/2012   Procedure: TOTAL KNEE ARTHROPLASTY with revision tibia;  Surgeon: Garald Balding, MD;  Location: [facility];  Service: Orthopedics;  Laterality: Left;   Social History   Occupational History  . Not on file  Tobacco Use  . Smoking status: Former    Packs/day: 2.00    Years: 20.00    Total pack years: 40.00    Types: Cigarettes    Quit date: 05/05/1984    Years since quitting: 38.1  . Smokeless tobacco: Never  Vaping Use  . Vaping Use: Never used  Substance and Sexual Activity  . Alcohol use: Yes    Alcohol/week: 0.0 standard drinks of alcohol    Comment: maybe 1 beer/month  . Drug use: No  . Sexual activity: Yes    Birth control/protection: None

## 2022-07-08 DIAGNOSIS — M79672 Pain in left foot: Secondary | ICD-10-CM | POA: Diagnosis not present

## 2022-07-08 DIAGNOSIS — Z1330 Encounter for screening examination for mental health and behavioral disorders, unspecified: Secondary | ICD-10-CM | POA: Diagnosis not present

## 2022-07-08 DIAGNOSIS — G603 Idiopathic progressive neuropathy: Secondary | ICD-10-CM | POA: Diagnosis not present

## 2022-07-08 DIAGNOSIS — L97512 Non-pressure chronic ulcer of other part of right foot with fat layer exposed: Secondary | ICD-10-CM | POA: Diagnosis not present

## 2022-07-08 DIAGNOSIS — M19072 Primary osteoarthritis, left ankle and foot: Secondary | ICD-10-CM | POA: Diagnosis not present

## 2022-07-08 DIAGNOSIS — S98112A Complete traumatic amputation of left great toe, initial encounter: Secondary | ICD-10-CM | POA: Diagnosis not present

## 2022-07-09 DIAGNOSIS — Z9081 Acquired absence of spleen: Secondary | ICD-10-CM | POA: Diagnosis not present

## 2022-07-09 DIAGNOSIS — Z89412 Acquired absence of left great toe: Secondary | ICD-10-CM | POA: Diagnosis not present

## 2022-07-09 DIAGNOSIS — R600 Localized edema: Secondary | ICD-10-CM | POA: Diagnosis not present

## 2022-07-09 DIAGNOSIS — L0390 Cellulitis, unspecified: Secondary | ICD-10-CM | POA: Diagnosis not present

## 2022-08-04 DIAGNOSIS — H401121 Primary open-angle glaucoma, left eye, mild stage: Secondary | ICD-10-CM | POA: Diagnosis not present

## 2022-08-08 ENCOUNTER — Telehealth: Payer: Self-pay | Admitting: Physician Assistant

## 2022-08-08 NOTE — Telephone Encounter (Signed)
Patient called. He would like a referral for an injection sent to [facility]. His call back number is [phone]

## 2022-08-12 DIAGNOSIS — G64 Other disorders of peripheral nervous system: Secondary | ICD-10-CM | POA: Diagnosis not present

## 2022-08-12 DIAGNOSIS — E559 Vitamin D deficiency, unspecified: Secondary | ICD-10-CM | POA: Diagnosis not present

## 2022-08-12 DIAGNOSIS — L0390 Cellulitis, unspecified: Secondary | ICD-10-CM | POA: Diagnosis not present

## 2022-08-12 DIAGNOSIS — E785 Hyperlipidemia, unspecified: Secondary | ICD-10-CM | POA: Diagnosis not present

## 2022-08-12 DIAGNOSIS — Z125 Encounter for screening for malignant neoplasm of prostate: Secondary | ICD-10-CM | POA: Diagnosis not present

## 2022-08-13 ENCOUNTER — Telehealth: Payer: Self-pay | Admitting: Physician Assistant

## 2022-08-13 DIAGNOSIS — G8929 Other chronic pain: Secondary | ICD-10-CM

## 2022-08-13 NOTE — Telephone Encounter (Signed)
Order placed in chart. Called and advised pt

## 2022-08-13 NOTE — Telephone Encounter (Signed)
Patient called. He would like a spinal injection.

## 2022-08-13 NOTE — Telephone Encounter (Signed)
Ok to order 

## 2022-08-15 ENCOUNTER — Inpatient Hospital Stay
Admission: RE | Admit: 2022-08-15 | Discharge: 2022-08-15 | Disposition: A | Discharge status: Home / Self Care | Payer: Medicare Other | Source: Ambulatory Visit | Attending: Physician Assistant | Admitting: Physician Assistant

## 2022-08-15 NOTE — Discharge Instructions (Signed)

## 2022-08-20 DIAGNOSIS — M79672 Pain in left foot: Secondary | ICD-10-CM | POA: Diagnosis not present

## 2022-08-20 DIAGNOSIS — R5383 Other fatigue: Secondary | ICD-10-CM | POA: Diagnosis not present

## 2022-08-20 DIAGNOSIS — Z6841 Body Mass Index (BMI) 40.0 and over, adult: Secondary | ICD-10-CM | POA: Diagnosis not present

## 2022-08-20 DIAGNOSIS — G64 Other disorders of peripheral nervous system: Secondary | ICD-10-CM | POA: Diagnosis not present

## 2022-08-20 DIAGNOSIS — M1990 Unspecified osteoarthritis, unspecified site: Secondary | ICD-10-CM | POA: Diagnosis not present

## 2022-08-20 DIAGNOSIS — Z0001 Encounter for general adult medical examination with abnormal findings: Secondary | ICD-10-CM | POA: Diagnosis not present

## 2022-08-20 DIAGNOSIS — R7309 Other abnormal glucose: Secondary | ICD-10-CM | POA: Diagnosis not present

## 2022-08-20 DIAGNOSIS — R600 Localized edema: Secondary | ICD-10-CM | POA: Diagnosis not present

## 2022-08-22 ENCOUNTER — Ambulatory Visit
Admission: RE | Admit: 2022-08-22 | Discharge: 2022-08-22 | Disposition: A | Discharge status: Home / Self Care | Payer: Medicare Other | Source: Ambulatory Visit | Attending: Physician Assistant | Admitting: Physician Assistant

## 2022-08-22 DIAGNOSIS — G8929 Other chronic pain: Secondary | ICD-10-CM

## 2022-08-22 DIAGNOSIS — M47817 Spondylosis without myelopathy or radiculopathy, lumbosacral region: Secondary | ICD-10-CM | POA: Diagnosis not present

## 2022-08-22 MED ORDER — METHYLPREDNISOLONE ACETATE 40 MG/ML INJ SUSP (RADIOLOG
80.0000 mg | Freq: Once | INTRAMUSCULAR | Status: AC
  Administered 2022-08-22: 80 mg via EPIDURAL

## 2022-08-22 MED ORDER — IOPAMIDOL (ISOVUE-M 200) INJECTION 41%
1.0000 mL | Freq: Once | INTRAMUSCULAR | Status: AC
  Administered 2022-08-22: 1 mL via EPIDURAL

## 2022-08-22 NOTE — Discharge Instructions (Signed)

## 2022-09-05 ENCOUNTER — Telehealth: Payer: Self-pay | Admitting: Physician Assistant

## 2022-09-05 DIAGNOSIS — M545 Low back pain, unspecified: Secondary | ICD-10-CM

## 2022-09-05 NOTE — Telephone Encounter (Signed)
I talked to the pt. He said that [facility] said he could have another inj. Ill send the referral in

## 2022-09-05 NOTE — Telephone Encounter (Signed)
Pt called requesting PA Persons sent in referral at [facility] for spinal injections. Please call pt at [phone].

## 2022-09-10 DIAGNOSIS — M545 Low back pain, unspecified: Secondary | ICD-10-CM | POA: Diagnosis not present

## 2022-09-10 DIAGNOSIS — R262 Difficulty in walking, not elsewhere classified: Secondary | ICD-10-CM | POA: Diagnosis not present

## 2022-09-10 DIAGNOSIS — M6281 Muscle weakness (generalized): Secondary | ICD-10-CM | POA: Diagnosis not present

## 2022-09-11 ENCOUNTER — Encounter: Payer: Self-pay | Admitting: Oncology

## 2022-09-11 ENCOUNTER — Ambulatory Visit
Admission: RE | Admit: 2022-09-11 | Discharge: 2022-09-11 | Disposition: A | Discharge status: Home / Self Care | Payer: Medicare Other | Source: Ambulatory Visit | Attending: Physician Assistant | Admitting: Physician Assistant

## 2022-09-11 DIAGNOSIS — M545 Low back pain, unspecified: Secondary | ICD-10-CM | POA: Diagnosis not present

## 2022-09-11 DIAGNOSIS — M25551 Pain in right hip: Secondary | ICD-10-CM | POA: Diagnosis not present

## 2022-09-11 DIAGNOSIS — M47817 Spondylosis without myelopathy or radiculopathy, lumbosacral region: Secondary | ICD-10-CM | POA: Diagnosis not present

## 2022-09-11 MED ORDER — METHYLPREDNISOLONE ACETATE 40 MG/ML INJ SUSP (RADIOLOG
80.0000 mg | Freq: Once | INTRAMUSCULAR | Status: AC
  Administered 2022-09-11: 80 mg via EPIDURAL

## 2022-09-11 MED ORDER — IOPAMIDOL (ISOVUE-M 200) INJECTION 41%
1.0000 mL | Freq: Once | INTRAMUSCULAR | Status: AC
  Administered 2022-09-11: 1 mL via EPIDURAL

## 2022-09-11 NOTE — Discharge Instructions (Signed)

## 2022-10-06 DIAGNOSIS — M6281 Muscle weakness (generalized): Secondary | ICD-10-CM | POA: Diagnosis not present

## 2022-10-06 DIAGNOSIS — R262 Difficulty in walking, not elsewhere classified: Secondary | ICD-10-CM | POA: Diagnosis not present

## 2022-10-06 DIAGNOSIS — M545 Low back pain, unspecified: Secondary | ICD-10-CM | POA: Diagnosis not present

## 2022-10-14 DIAGNOSIS — I1A0 Resistant hypertension: Secondary | ICD-10-CM | POA: Diagnosis not present

## 2022-10-14 DIAGNOSIS — J029 Acute pharyngitis, unspecified: Secondary | ICD-10-CM | POA: Diagnosis not present

## 2022-10-27 DIAGNOSIS — R262 Difficulty in walking, not elsewhere classified: Secondary | ICD-10-CM | POA: Diagnosis not present

## 2022-10-27 DIAGNOSIS — M545 Low back pain, unspecified: Secondary | ICD-10-CM | POA: Diagnosis not present

## 2022-10-27 DIAGNOSIS — M6281 Muscle weakness (generalized): Secondary | ICD-10-CM | POA: Diagnosis not present

## 2022-10-28 ENCOUNTER — Encounter: Payer: Self-pay | Admitting: Oncology

## 2022-10-30 ENCOUNTER — Other Ambulatory Visit (INDEPENDENT_AMBULATORY_CARE_PROVIDER_SITE_OTHER): Payer: Medicare Other

## 2022-10-30 ENCOUNTER — Ambulatory Visit (INDEPENDENT_AMBULATORY_CARE_PROVIDER_SITE_OTHER): Payer: Medicare Other | Admitting: Physician Assistant

## 2022-10-30 ENCOUNTER — Encounter: Payer: Self-pay | Admitting: Physician Assistant

## 2022-10-30 DIAGNOSIS — G8929 Other chronic pain: Secondary | ICD-10-CM | POA: Diagnosis not present

## 2022-10-30 DIAGNOSIS — M545 Low back pain, unspecified: Secondary | ICD-10-CM | POA: Diagnosis not present

## 2022-10-30 DIAGNOSIS — M25571 Pain in right ankle and joints of right foot: Secondary | ICD-10-CM

## 2022-10-30 MED ORDER — METHYLPREDNISOLONE ACETATE 40 MG/ML IJ SUSP
40.0000 mg | INTRAMUSCULAR | Status: AC | PRN
Start: 2022-10-30 — End: 2022-10-30
  Administered 2022-10-30: 40 mg via INTRA_ARTICULAR

## 2022-10-30 MED ORDER — LIDOCAINE HCL 1 % IJ SOLN
2.0000 mL | INTRAMUSCULAR | Status: AC | PRN
Start: 2022-10-30 — End: 2022-10-30
  Administered 2022-10-30: 2 mL

## 2022-10-30 NOTE — Progress Notes (Signed)
Office Visit Note   Patient: Marc Mills           Date of Birth: 1945-06-14           MRN: [id number] Visit Date: 10/30/2022              Requested by: Marc Mills, [place], PA [address] [place],  [place] [address] PCP: Marc Devon, MD  Chief Complaint  Patient presents with  . Right Ankle - Pain      HPI: Marc Mills is a pleasant 77 year old gentleman who is a patient of Dr. Hoy Register.  He has a history of traumatic arthritis to in his right ankle.  He periodically comes in for an injection.  Last injection was over 5 months ago.  No new injury.  He rates the pain as moderate he is also requesting referral so he can go back to [facility] imaging for another epidural steroid injection into his lumbar spine  Assessment & Plan: Visit Diagnoses:  1. Pain in right ankle and joints of right foot     Plan: Patient was injected without difficulty.  May follow-up with me as needed will make referral to [facility] imaging  Follow-Up Instructions: No follow-ups on file.   Ortho Exam  Patient is alert, oriented, no adenopathy, well-dressed, normal affect, normal respiratory effort. Examination of his right ankle he has no cellulitis no erythema he has very little range of motion secondary to the arthritis he is compartments are soft nontender he does have active dorsiflexion though has minimal motion comes to about neutral.  Imaging: XR Ankle Complete Right  Result Date: 10/30/2022 Radiographs of his right ankle were obtained today.  He has chronic deformity of the fibula.  He has hardware in place in the tibia and medial malleolus.  Advanced degenerative changes of the tibial talar joint.  Progressed slightly from previous  No images are attached to the encounter.  Labs: Lab Results  Component Value Date   HGBA1C 5.6 08/29/2021   HGBA1C 5.8 (H) 09/01/2015   HGBA1C 5.8 (H) 03/19/2015   ESRSEDRATE 20 (H) 08/29/2021   ESRSEDRATE 1 04/08/2010   CRP 1.3 (H) 08/29/2021    LABURIC 4.8 12/03/2012   LABURIC 5.4 09/16/2012   LABURIC 6.1 09/03/2012   REPTSTATUS 09/02/2021 FINAL 08/28/2021   GRAMSTAIN No WBC Seen 07/27/2013   GRAMSTAIN No Squamous Epithelial Cells Seen 07/27/2013   GRAMSTAIN Few GRAM POSITIVE COCCI IN PAIRS 07/27/2013   CULT  08/28/2021    NO GROWTH 5 DAYS Performed at [facility], [address], [place], [place] [address]    LABORGA NO GROWTH 03/28/2015     Lab Results  Component Value Date   ALBUMIN 3.3 (L) 08/29/2021   ALBUMIN 3.6 08/28/2021   ALBUMIN 4.0 09/12/2016   PREALBUMIN 21.4 08/29/2021    Lab Results  Component Value Date   MG 1.8 09/01/2015   No results found for: "VD25OH"  Lab Results  Component Value Date   PREALBUMIN 21.4 08/29/2021      Latest Ref Rng & Units 08/29/2021    5:30 AM 08/28/2021    2:28 PM 09/12/2016    9:54 AM  CBC EXTENDED  WBC 4.0 - 10.5 K/uL 8.3  8.3  6.0   RBC 4.22 - 5.81 MIL/uL 4.38  4.41  4.73   Hemoglobin 13.0 - 17.0 g/dL 29.5  28.4  13.2   HCT 39.0 - 52.0 % 41.6  42.4  43.4   Platelets 150 - 400 K/uL 304  323  307   NEUT# 1.7 - 7.7 K/uL  6.0  3.6   Lymph# 0.7 - 4.0 K/uL  0.9  1.0      There is no height or weight on file to calculate BMI.  Orders:  Orders Placed This Encounter  Procedures  . XR Ankle Complete Right   No orders of the defined types were placed in this encounter.    Procedures: Medium Joint Inj: R ankle on 10/30/2022 9:08 AM Indications: pain and diagnostic evaluation Details: 25 G 1.5 in needle, medial approach Medications: 2 mL lidocaine 1 %; 40 mg methylPREDNISolone acetate 40 MG/ML Outcome: tolerated well, no immediate complications Procedure, treatment alternatives, risks and benefits explained, specific risks discussed. Consent was given by the patient.    Clinical Data: No additional findings.  ROS:  All other systems negative, except as noted in the HPI. Review of Systems  Objective: Vital Signs: There were no vitals taken for  this visit.  Specialty Comments:  No specialty comments available.  PMFS History: Patient Active Problem List   Diagnosis Date Noted  . Post-traumatic osteoarthritis, right ankle and foot 02/18/2022  . Toe pain, right 02/18/2022  . Essential hypertension 08/28/2021  . Low back pain 06/27/2021  . Acute embolism and thrombosis of unspecified deep veins of unspecified lower extremity (HCC) 06/03/2021  . Anal fistula 06/03/2021  . Anxiety 06/03/2021  . Erectile dysfunction 06/03/2021  . Essential tremor 06/03/2021  . History of lymphoma 06/03/2021  . Impacted cerumen 06/03/2021  . Peripheral neuropathy 06/03/2021  . Personal history of colonic polyps 06/03/2021  . Rectal fistula 06/03/2021  . Inflamed seborrheic keratosis 06/03/2021  . Vitamin D deficiency 06/03/2021  . Tremor 06/03/2021  . Eustachian tube dysfunction, left 12/10/2020  . Otalgia, left 12/10/2020  . Nondisplaced fracture of distal phalanx of left middle finger, initial encounter for open fracture 09/18/2020  . Pain in right hip 03/27/2020  . Laceration of right ring finger 05/05/2019  . Laceration of flexor muscle, fascia and tendon of right ring finger at wrist and hand level, initial encounter 04/23/2019  . Unspecified fracture of the lower end of right radius, initial encounter for closed fracture 04/23/2019  . Fracture of unspecified phalanx of right ring finger, initial encounter for open fracture 04/23/2019  . Left knee pain 06/14/2018  . Pain in left ankle and joints of left foot 12/18/2017  . Bilateral edema of lower extremity 10/30/2015  . Onychomycosis 10/30/2015  . Sepsis (HCC) 08/31/2015  . History of pulmonary embolism   . History of DVT (deep vein thrombosis)   . Pyrexia   . Cellulitis 03/18/2015  . Chronic anticoagulation 03/17/2015  . Morbid obesity with BMI of 45.0-49.9, adult (HCC) 03/17/2015  . S/P IVC filter 03/17/2015  . Genetic testing 01/12/2015  . Postoperative wound infection  12/09/2013  . Postop check 11/24/2013  . Ulcer of lower limb, unspecified 08/24/2013  . Inguinal lymphadenopathy 08/16/2013  . Encounter for therapeutic drug monitoring 06/09/2013  . Left leg cellulitis 04/04/2013  . Osteoarthritis of left knee 11/02/2012  . Cellulitis of right lower extremity 04/30/2012  . 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 S/P splenectomy 04/29/2012  . Incisional hernia 05/27/2011  . 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  . Morbid obesity (HCC) 04/19/2008  . Obstructive sleep apnea 04/19/2008  . VARICOSE VEINS, LOWER EXTREMITIES 04/19/2008  . PULMONARY EMBOLISM, HX OF 04/19/2008   Past Medical History:  Diagnosis Date  . Anorectal fistula   . Arthritis   .  Blood transfusion without reported diagnosis   . Cancer (HCC)    lymphoma hx  . Cataract   . Cellulitis 04/04/2013  . DVT (deep venous thrombosis) (HCC)    2011 right leg  . History of lymphoma   . History of pulmonary embolus (PE)   . Incisional hernia   . Morbid obesity (HCC)   . OSA on CPAP    cpap  10 yrs  . Varicose veins of lower extremities    pe    Family History  Problem Relation Age of Onset  . Heart disease Father   . Varicose Veins Father   . Heart attack Father        dx. 9s  . Heart disease Mother 55  . Varicose Veins Mother   . Pancreatic cancer Sister 37  . Cancer Maternal Grandmother        either stomach or pancreatic primary   . Pancreatic cancer Maternal Grandfather        dx. 68s  . Lung cancer Maternal Aunt        secondhand smoke exposure  . Heart attack Paternal Uncle   . Rectal cancer Maternal Aunt        dx. 47s  . Cancer Maternal Aunt        unknown type  . Diabetes Paternal Uncle   . Cancer Cousin        unknown type  . Breast cancer Cousin        dx. 58s    Past Surgical History:  Procedure Laterality Date  . EYE SURGERY Left 03   cat, detached ret    . FRACTURE SURGERY    . HERNIA REPAIR    . INGUINAL LYMPH NODE  BIOPSY [name] 11/15/2013   Procedure: RIGHT INGUINAL LYMPH NODE BIOPSY;  Surgeon: Cherylynn Ridges, MD;  Location: [facility] OR;  Service: General;  Laterality: [name];  . ivc filter    . PROSTATE SURGERY     (patient denies)  . SPLENECTOMY    . TONSILLECTOMY     age 52  . TOTAL KNEE ARTHROPLASTY Left 11/02/2012   Procedure: TOTAL KNEE ARTHROPLASTY with revision tibia;  Surgeon: Valeria Batman, MD;  Location: [facility] OR;  Service: Orthopedics;  Laterality: Left;   Social History   Occupational History  . Not on file  Tobacco Use  . Smoking status: Former    Packs/day: 2.00    Years: 20.00    Additional pack years: 0.00    Total pack years: 40.00    Types: Cigarettes    Quit date: 05/05/1984    Years since quitting: 38.5  . Smokeless tobacco: Never  Vaping Use  . Vaping Use: Never used  Substance and Sexual Activity  . Alcohol use: Yes    Alcohol/week: 0.0 standard drinks of alcohol    Comment: maybe 1 beer/month  . Drug use: No  . Sexual activity: Yes    Birth control/protection: None

## 2022-11-05 DIAGNOSIS — G603 Idiopathic progressive neuropathy: Secondary | ICD-10-CM | POA: Diagnosis not present

## 2022-11-05 DIAGNOSIS — R234 Changes in skin texture: Secondary | ICD-10-CM | POA: Diagnosis not present

## 2022-11-05 DIAGNOSIS — S98112A Complete traumatic amputation of left great toe, initial encounter: Secondary | ICD-10-CM | POA: Diagnosis not present

## 2022-11-11 DIAGNOSIS — M6281 Muscle weakness (generalized): Secondary | ICD-10-CM | POA: Diagnosis not present

## 2022-11-11 DIAGNOSIS — M545 Low back pain, unspecified: Secondary | ICD-10-CM | POA: Diagnosis not present

## 2022-11-11 DIAGNOSIS — R262 Difficulty in walking, not elsewhere classified: Secondary | ICD-10-CM | POA: Diagnosis not present

## 2022-11-19 DIAGNOSIS — Z89412 Acquired absence of left great toe: Secondary | ICD-10-CM | POA: Diagnosis not present

## 2022-11-19 DIAGNOSIS — L0390 Cellulitis, unspecified: Secondary | ICD-10-CM | POA: Diagnosis not present

## 2022-11-19 DIAGNOSIS — R600 Localized edema: Secondary | ICD-10-CM | POA: Diagnosis not present

## 2022-11-19 DIAGNOSIS — Z9081 Acquired absence of spleen: Secondary | ICD-10-CM | POA: Diagnosis not present

## 2022-11-22 DIAGNOSIS — M545 Low back pain, unspecified: Secondary | ICD-10-CM | POA: Diagnosis not present

## 2022-11-22 DIAGNOSIS — R262 Difficulty in walking, not elsewhere classified: Secondary | ICD-10-CM | POA: Diagnosis not present

## 2022-11-22 DIAGNOSIS — M6281 Muscle weakness (generalized): Secondary | ICD-10-CM | POA: Diagnosis not present

## 2022-12-03 DIAGNOSIS — G603 Idiopathic progressive neuropathy: Secondary | ICD-10-CM | POA: Diagnosis not present

## 2022-12-03 DIAGNOSIS — S98112A Complete traumatic amputation of left great toe, initial encounter: Secondary | ICD-10-CM | POA: Diagnosis not present

## 2022-12-03 DIAGNOSIS — R234 Changes in skin texture: Secondary | ICD-10-CM | POA: Diagnosis not present

## 2022-12-12 DIAGNOSIS — M6281 Muscle weakness (generalized): Secondary | ICD-10-CM | POA: Diagnosis not present

## 2022-12-12 DIAGNOSIS — M545 Low back pain, unspecified: Secondary | ICD-10-CM | POA: Diagnosis not present

## 2022-12-12 DIAGNOSIS — R262 Difficulty in walking, not elsewhere classified: Secondary | ICD-10-CM | POA: Diagnosis not present

## 2022-12-17 DIAGNOSIS — R262 Difficulty in walking, not elsewhere classified: Secondary | ICD-10-CM | POA: Diagnosis not present

## 2022-12-17 DIAGNOSIS — M6281 Muscle weakness (generalized): Secondary | ICD-10-CM | POA: Diagnosis not present

## 2022-12-17 DIAGNOSIS — M545 Low back pain, unspecified: Secondary | ICD-10-CM | POA: Diagnosis not present

## 2022-12-25 DIAGNOSIS — G603 Idiopathic progressive neuropathy: Secondary | ICD-10-CM | POA: Diagnosis not present

## 2022-12-25 DIAGNOSIS — M19071 Primary osteoarthritis, right ankle and foot: Secondary | ICD-10-CM | POA: Diagnosis not present

## 2022-12-25 DIAGNOSIS — R234 Changes in skin texture: Secondary | ICD-10-CM | POA: Diagnosis not present

## 2022-12-30 DIAGNOSIS — M6281 Muscle weakness (generalized): Secondary | ICD-10-CM | POA: Diagnosis not present

## 2022-12-30 DIAGNOSIS — M545 Low back pain, unspecified: Secondary | ICD-10-CM | POA: Diagnosis not present

## 2022-12-30 DIAGNOSIS — R262 Difficulty in walking, not elsewhere classified: Secondary | ICD-10-CM | POA: Diagnosis not present

## 2023-01-06 DIAGNOSIS — M545 Low back pain, unspecified: Secondary | ICD-10-CM | POA: Diagnosis not present

## 2023-01-06 DIAGNOSIS — M6281 Muscle weakness (generalized): Secondary | ICD-10-CM | POA: Diagnosis not present

## 2023-01-06 DIAGNOSIS — R262 Difficulty in walking, not elsewhere classified: Secondary | ICD-10-CM | POA: Diagnosis not present

## 2023-01-14 DIAGNOSIS — M545 Low back pain, unspecified: Secondary | ICD-10-CM | POA: Diagnosis not present

## 2023-01-14 DIAGNOSIS — R262 Difficulty in walking, not elsewhere classified: Secondary | ICD-10-CM | POA: Diagnosis not present

## 2023-01-14 DIAGNOSIS — M6281 Muscle weakness (generalized): Secondary | ICD-10-CM | POA: Diagnosis not present

## 2023-01-19 DIAGNOSIS — Z23 Encounter for immunization: Secondary | ICD-10-CM | POA: Diagnosis not present

## 2023-02-02 DIAGNOSIS — H401121 Primary open-angle glaucoma, left eye, mild stage: Secondary | ICD-10-CM | POA: Diagnosis not present

## 2023-02-02 DIAGNOSIS — H31002 Unspecified chorioretinal scars, left eye: Secondary | ICD-10-CM | POA: Diagnosis not present

## 2023-02-02 DIAGNOSIS — H5213 Myopia, bilateral: Secondary | ICD-10-CM | POA: Diagnosis not present

## 2023-02-02 DIAGNOSIS — Z961 Presence of intraocular lens: Secondary | ICD-10-CM | POA: Diagnosis not present

## 2023-02-02 DIAGNOSIS — H35371 Puckering of macula, right eye: Secondary | ICD-10-CM | POA: Diagnosis not present

## 2023-02-04 ENCOUNTER — Other Ambulatory Visit: Payer: Self-pay

## 2023-02-04 ENCOUNTER — Encounter: Payer: Self-pay | Admitting: Physician Assistant

## 2023-02-04 ENCOUNTER — Ambulatory Visit (INDEPENDENT_AMBULATORY_CARE_PROVIDER_SITE_OTHER): Payer: Medicare Other | Admitting: Physician Assistant

## 2023-02-04 ENCOUNTER — Ambulatory Visit (INDEPENDENT_AMBULATORY_CARE_PROVIDER_SITE_OTHER): Payer: Medicare Other | Admitting: Sports Medicine

## 2023-02-04 DIAGNOSIS — M25571 Pain in right ankle and joints of right foot: Secondary | ICD-10-CM

## 2023-02-04 DIAGNOSIS — M19071 Primary osteoarthritis, right ankle and foot: Secondary | ICD-10-CM | POA: Insufficient documentation

## 2023-02-04 MED ORDER — METHYLPREDNISOLONE ACETATE 40 MG/ML IJ SUSP
40.0000 mg | INTRAMUSCULAR | Status: AC | PRN
Start: 2023-02-04 — End: 2023-02-04
  Administered 2023-02-04: 40 mg via INTRA_ARTICULAR

## 2023-02-04 MED ORDER — BUPIVACAINE HCL 0.25 % IJ SOLN
2.0000 mL | INTRAMUSCULAR | Status: AC | PRN
Start: 2023-02-04 — End: 2023-02-04
  Administered 2023-02-04: 2 mL via INTRA_ARTICULAR

## 2023-02-04 MED ORDER — LIDOCAINE HCL 1 % IJ SOLN
1.0000 mL | INTRAMUSCULAR | Status: AC | PRN
Start: 2023-02-04 — End: 2023-02-04
  Administered 2023-02-04: 1 mL

## 2023-02-04 NOTE — Progress Notes (Signed)
Procedure Note  Patient: Marc Mills             Date of Birth: [birth year]           MRN: [id number]             Visit Date: 02/04/2023  Procedures: Visit Diagnoses:  1. Arthritis of right ankle   2. Pain in right ankle and joints of right foot    Medium Joint Inj: R ankle on 02/04/2023 9:51 AM Indications: pain Details: 22 G 1.5 in needle, ultrasound-guided anterior approach Medications: 1 mL lidocaine 1 %; 2 mL bupivacaine 0.25 %; 40 mg methylPREDNISolone acetate 40 MG/ML Outcome: tolerated well, no immediate complications  *Technically successful ultrasound-guided right ankle injection *In-plane approach Procedure, treatment alternatives, risks and benefits explained, specific risks discussed. Consent was given by the patient. Immediately prior to procedure a time out was called to verify the correct patient, procedure, equipment, support staff and site/side marked as required. Patient was prepped and draped in the usual sterile fashion.     - I evaluated the patient about 5 minutes post-injection and he had improvement in pain and range of motion following anesthetic portion - follow-up with Mary-Anne Persons as indicated; I am happy to see him as needed  Madelyn Brunner, DO Primary Care Sports Medicine Physician  [facility] - Orthopedics  This note was dictated using Dragon naturally speaking software and may contain errors in syntax, spelling, or content which have not been identified prior to signing this note.

## 2023-02-04 NOTE — Progress Notes (Signed)
Office Visit Note   Patient: Marc Mills           Date of Birth: February 28, 1946           MRN: [id number] Visit Date: 02/04/2023              Requested by: Andi Devon, MD [address] [place],  [place] [address] PCP: Andi Devon, MD  No chief complaint on file.     HPI: Marc Mills is a pleasant 77 year old gentleman who is a longtime patient of Dr. Cleophas Dunker.  He has a history of traumatic arthritis in his right ankle.  He has been treating this conservatively with intra-articular injections.  No new injury..  Assessment & Plan: Visit Diagnoses: Osteoarthritis right ankle  Plan: He does have significant joint space narrowing.  Has chronic swelling.  From what he relates a podiatrist tried to do an injection a couple months ago but was not successful.  Given the progressive nature of his arthritis I discussed with him that I believe that his best option at this time would be to have ultrasound-guided injections we will refer him today to Dr. Shon Baton  Follow-Up Instructions: No follow-ups on file.   Ortho Exam  Patient is alert, oriented, no adenopathy, well-dressed, normal affect, normal respiratory effort. Right ankle mild soft tissue's moderate soft tissue swelling but no cellulitis neurovascular intact has very limited motion.  Tender over the ankle joint line.  Compartments are soft and compressible  Imaging: No results found. No images are attached to the encounter.  Labs: Lab Results  Component Value Date   HGBA1C 5.6 08/29/2021   HGBA1C 5.8 (H) 09/01/2015   HGBA1C 5.8 (H) 03/19/2015   ESRSEDRATE 20 (H) 08/29/2021   ESRSEDRATE 1 04/08/2010   CRP 1.3 (H) 08/29/2021   LABURIC 4.8 12/03/2012   LABURIC 5.4 09/16/2012   LABURIC 6.1 09/03/2012   REPTSTATUS 09/02/2021 FINAL 08/28/2021   GRAMSTAIN No WBC Seen 07/27/2013   GRAMSTAIN No Squamous Epithelial Cells Seen 07/27/2013   GRAMSTAIN Few GRAM POSITIVE COCCI IN PAIRS 07/27/2013   CULT   08/28/2021    NO GROWTH 5 DAYS Performed at [facility], [address] [place], [place] [address]    LABORGA NO GROWTH 03/28/2015     Lab Results  Component Value Date   ALBUMIN 3.3 (L) 08/29/2021   ALBUMIN 3.6 08/28/2021   ALBUMIN 4.0 09/12/2016   PREALBUMIN 21.4 08/29/2021    Lab Results  Component Value Date   MG 1.8 09/01/2015   No results found for: "VD25OH"  Lab Results  Component Value Date   PREALBUMIN 21.4 08/29/2021      Latest Ref Rng & Units 08/29/2021    5:30 AM 08/28/2021    2:28 PM 09/12/2016    9:54 AM  CBC EXTENDED  WBC 4.0 - 10.5 K/uL 8.3  8.3  6.0   RBC 4.22 - 5.81 MIL/uL 4.38  4.41  4.73   Hemoglobin 13.0 - 17.0 g/dL 10.2  72.5  36.6   HCT 39.0 - 52.0 % 41.6  42.4  43.4   Platelets 150 - 400 K/uL 304  323  307   NEUT# 1.7 - 7.7 K/uL  6.0  3.6   Lymph# 0.7 - 4.0 K/uL  0.9  1.0      There is no height or weight on file to calculate BMI.  Orders:  No orders of the defined types were placed in this encounter.  No orders of the defined  types were placed in this encounter.    Procedures: No procedures performed  Clinical Data: No additional findings.  ROS:  All other systems negative, except as noted in the HPI. Review of Systems  Objective: Vital Signs: There were no vitals taken for this visit.  Specialty Comments:  No specialty comments available.  PMFS History: Patient Active Problem List   Diagnosis Date Noted   Post-traumatic osteoarthritis, right ankle and foot 02/18/2022   Toe pain, right 02/18/2022   Essential hypertension 08/28/2021   Low back pain 06/27/2021   Acute embolism and thrombosis of unspecified deep veins of unspecified lower extremity (HCC) 06/03/2021   Anal fistula 06/03/2021   Anxiety 06/03/2021   Erectile dysfunction 06/03/2021   Essential tremor 06/03/2021   History of lymphoma 06/03/2021   Impacted cerumen 06/03/2021   Peripheral neuropathy 06/03/2021   History of colonic polyps 06/03/2021    Rectal fistula 06/03/2021   Inflamed seborrheic keratosis 06/03/2021   Vitamin D deficiency 06/03/2021   Tremor 06/03/2021   Eustachian tube dysfunction, left 12/10/2020   Otalgia, left 12/10/2020   Nondisplaced fracture of distal phalanx of left middle finger, initial encounter for open fracture 09/18/2020   Pain in right hip 03/27/2020   Laceration of right ring finger 05/05/2019   Laceration of flexor muscle, fascia and tendon of right ring finger at wrist and hand level, initial encounter 04/23/2019   Unspecified fracture of the lower end of right radius, initial encounter for closed fracture 04/23/2019   Fracture of unspecified phalanx of right ring finger, initial encounter for open fracture 04/23/2019   Left knee pain 06/14/2018   Pain in left ankle and joints of left foot 12/18/2017   Bilateral edema of lower extremity 10/30/2015   Onychomycosis 10/30/2015   Sepsis (HCC) 08/31/2015   History of pulmonary embolism    History of DVT (deep vein thrombosis)    Pyrexia    Cellulitis 03/18/2015   Chronic anticoagulation 03/17/2015   Morbid obesity with BMI of 45.0-49.9, adult (HCC) 03/17/2015   S/P IVC filter 03/17/2015   Genetic testing 01/12/2015   Postoperative wound infection 12/09/2013   Postop check 11/24/2013   Ulcer of lower limb (HCC) 08/24/2013   Inguinal lymphadenopathy 08/16/2013   Encounter for therapeutic drug monitoring 06/09/2013   Left leg cellulitis 04/04/2013   Osteoarthritis of left knee 11/02/2012   Cellulitis of right lower extremity 04/30/2012   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 04/29/2012   S/P splenectomy 04/29/2012   Incisional hernia 05/27/2011   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   Morbid obesity (HCC) 04/19/2008   Obstructive sleep apnea 04/19/2008   VARICOSE VEINS, LOWER EXTREMITIES 04/19/2008   PULMONARY EMBOLISM, HX OF 04/19/2008   Past Medical History:  Diagnosis Date   Anorectal fistula    Arthritis    Blood  transfusion without reported diagnosis    Cancer (HCC)    lymphoma hx   Cataract    Cellulitis 04/04/2013   DVT (deep venous thrombosis) (HCC)    2011 right leg   History of lymphoma    History of pulmonary embolus (PE)    Incisional hernia    Morbid obesity (HCC)    OSA on CPAP    cpap  10 yrs   Varicose veins of lower extremities    pe    Family History  Problem Relation Age of Onset   Heart disease Father    Varicose Veins Father    Heart attack Father  dx. 61s   Heart disease Mother 36   Varicose Veins Mother    Pancreatic cancer Sister 68   Cancer Maternal Grandmother        either stomach or pancreatic primary    Pancreatic cancer Maternal Grandfather        dx. 50s   Lung cancer Maternal Aunt        secondhand smoke exposure   Heart attack Paternal Uncle    Rectal cancer Maternal Aunt        dx. 80s   Cancer Maternal Aunt        unknown type   Diabetes Paternal Uncle    Cancer Cousin        unknown type   Breast cancer Cousin        dx. 36s    Past Surgical History:  Procedure Laterality Date   EYE SURGERY Left 03   cat, detached ret     FRACTURE SURGERY     HERNIA REPAIR     INGUINAL LYMPH NODE BIOPSY [name] 11/15/2013   Procedure: RIGHT INGUINAL LYMPH NODE BIOPSY;  Surgeon: Cherylynn Ridges, MD;  Location: [facility] OR;  Service: General;  Laterality: [name];   ivc filter     PROSTATE SURGERY     (patient denies)   SPLENECTOMY     TONSILLECTOMY     age 21   TOTAL KNEE ARTHROPLASTY Left 11/02/2012   Procedure: TOTAL KNEE ARTHROPLASTY with revision tibia;  Surgeon: Valeria Batman, MD;  Location: [facility] OR;  Service: Orthopedics;  Laterality: Left;   Social History   Occupational History   Not on file  Tobacco Use   Smoking status: Former    Current packs/day: 0.00    Average packs/day: 2.0 packs/day for 20.0 years (40.0 ttl pk-yrs)    Types: Cigarettes    Start date: 05/05/1964    Quit date: 05/05/1984    Years since quitting: 38.7   Smokeless tobacco: Never   Vaping Use   Vaping status: Never Used  Substance and Sexual Activity   Alcohol use: Yes    Alcohol/week: 0.0 standard drinks of alcohol    Comment: maybe 1 beer/month   Drug use: No   Sexual activity: Yes    Birth control/protection: None

## 2023-02-11 DIAGNOSIS — M6281 Muscle weakness (generalized): Secondary | ICD-10-CM | POA: Diagnosis not present

## 2023-02-11 DIAGNOSIS — R262 Difficulty in walking, not elsewhere classified: Secondary | ICD-10-CM | POA: Diagnosis not present

## 2023-02-11 DIAGNOSIS — M545 Low back pain, unspecified: Secondary | ICD-10-CM | POA: Diagnosis not present

## 2023-02-12 DIAGNOSIS — Z23 Encounter for immunization: Secondary | ICD-10-CM | POA: Diagnosis not present

## 2023-02-16 DIAGNOSIS — M19072 Primary osteoarthritis, left ankle and foot: Secondary | ICD-10-CM | POA: Diagnosis not present

## 2023-02-16 DIAGNOSIS — M79672 Pain in left foot: Secondary | ICD-10-CM | POA: Diagnosis not present

## 2023-02-16 DIAGNOSIS — I890 Lymphedema, not elsewhere classified: Secondary | ICD-10-CM | POA: Diagnosis not present

## 2023-02-16 DIAGNOSIS — Z6841 Body Mass Index (BMI) 40.0 and over, adult: Secondary | ICD-10-CM | POA: Diagnosis not present

## 2023-03-23 ENCOUNTER — Encounter: Payer: Self-pay | Admitting: Sports Medicine

## 2023-03-25 DIAGNOSIS — R600 Localized edema: Secondary | ICD-10-CM | POA: Diagnosis not present

## 2023-03-25 DIAGNOSIS — Z89412 Acquired absence of left great toe: Secondary | ICD-10-CM | POA: Diagnosis not present

## 2023-03-25 DIAGNOSIS — M6281 Muscle weakness (generalized): Secondary | ICD-10-CM | POA: Diagnosis not present

## 2023-03-25 DIAGNOSIS — Z23 Encounter for immunization: Secondary | ICD-10-CM | POA: Diagnosis not present

## 2023-03-25 DIAGNOSIS — M545 Low back pain, unspecified: Secondary | ICD-10-CM | POA: Diagnosis not present

## 2023-03-25 DIAGNOSIS — Z9081 Acquired absence of spleen: Secondary | ICD-10-CM | POA: Diagnosis not present

## 2023-03-25 DIAGNOSIS — R262 Difficulty in walking, not elsewhere classified: Secondary | ICD-10-CM | POA: Diagnosis not present

## 2023-03-25 DIAGNOSIS — L0390 Cellulitis, unspecified: Secondary | ICD-10-CM | POA: Diagnosis not present

## 2023-03-30 ENCOUNTER — Other Ambulatory Visit (INDEPENDENT_AMBULATORY_CARE_PROVIDER_SITE_OTHER): Payer: Self-pay

## 2023-03-30 ENCOUNTER — Encounter: Payer: Self-pay | Admitting: Sports Medicine

## 2023-03-30 ENCOUNTER — Ambulatory Visit (INDEPENDENT_AMBULATORY_CARE_PROVIDER_SITE_OTHER): Payer: Medicare Other | Admitting: Sports Medicine

## 2023-03-30 DIAGNOSIS — M7581 Other shoulder lesions, right shoulder: Secondary | ICD-10-CM

## 2023-03-30 DIAGNOSIS — M25511 Pain in right shoulder: Secondary | ICD-10-CM

## 2023-03-30 DIAGNOSIS — G8929 Other chronic pain: Secondary | ICD-10-CM

## 2023-03-30 MED ORDER — LIDOCAINE HCL 1 % IJ SOLN
2.0000 mL | INTRAMUSCULAR | Status: AC | PRN
Start: 2023-03-30 — End: 2023-03-30
  Administered 2023-03-30: 2 mL

## 2023-03-30 MED ORDER — METHYLPREDNISOLONE ACETATE 40 MG/ML IJ SUSP
40.0000 mg | INTRAMUSCULAR | Status: AC | PRN
Start: 2023-03-30 — End: 2023-03-30
  Administered 2023-03-30: 40 mg via INTRA_ARTICULAR

## 2023-03-30 MED ORDER — BUPIVACAINE HCL 0.25 % IJ SOLN
2.0000 mL | INTRAMUSCULAR | Status: AC | PRN
Start: 2023-03-30 — End: 2023-03-30
  Administered 2023-03-30: 2 mL via INTRA_ARTICULAR

## 2023-03-30 NOTE — Progress Notes (Signed)
Marc Mills - 77 y.o. male MRN [id number]  Date of birth: 1946/02/04  Office Visit Note: Visit Date: 03/30/2023 PCP: Andi Devon, MD Referred by: Andi Devon, MD  Subjective: Chief Complaint  Patient presents with   Right Shoulder - Pain   HPI: Marc Mills is a pleasant 77 y.o. male who presents today for evaluation of right shoulder pain.  Marc Mills has been experiencing right shoulder pain that starts at the top of the shoulder and will radiate into the deltoid.  This is worse with overhead activity and certain reaching motions.  He does swim 6 days a week as well as doing laps in the pool, he has had 2 alter how he swims with less overhead activity secondary to the pain.  He does take Tylenol without much relief.  He does do formalized physical therapy with Ellamae Sia, currently working on balance therapy.  Pertinent ROS were reviewed with the patient and found to be negative unless otherwise specified above in HPI.   Assessment & Plan: Visit Diagnoses:  1. Rotator cuff tendinitis, right   2. Right shoulder pain, unspecified chronicity    Plan: Impression is rotator cuff tendinitis likely from his swimming and repetitive overactivity in the pool.  X-ray is reassuring against any significant arthritic change or any bony abnormality.  He is heading down to [place] to spend time with family, through shared decision making we did proceed with subacromial joint injection to help calm down his pain and inflammation, patient tolerated well.  He will have 48 hours of modified rest and activity.  He may perform some rotator cuff rehab with Ellamae Sia at PT going forward if still having any issues.  He may use Tylenol, ice or heat for postinjection pain.  Follow-up with me as needed.  Follow-up: Return if symptoms worsen or fail to improve.   Meds & Orders: No orders of the defined types were placed in this encounter.   Orders Placed This Encounter   Procedures   Large Joint Inj   XR Shoulder Right     Procedures: Large Joint Inj: R subacromial bursa on 03/30/2023 2:39 PM Indications: pain Details: 22 G 1.5 in needle, posterior approach Medications: 2 mL lidocaine 1 %; 2 mL bupivacaine 0.25 %; 40 mg methylPREDNISolone acetate 40 MG/ML Outcome: tolerated well, no immediate complications  Subacromial Joint Injection, Right Shoulder After discussion on risks/benefits/indications, informed verbal consent was obtained. A timeout was then performed. Patient was seated on table in exam room. The patient's shoulder was prepped with betadine and alcohol swabs and utilizing posterior approach a 22G, 1.5" needle was directed anteriorly and laterally into the patient's subacromial space was injected with 2:2:1 mixture of lidocaine:bupivicaine:depomedrol with appreciation of free-flowing of the injectate into the bursal space. Patient tolerated the procedure well without immediate complications.   Procedure, treatment alternatives, risks and benefits explained, specific risks discussed. Consent was given by the patient. Immediately prior to procedure a time out was called to verify the correct patient, procedure, equipment, support staff and site/side marked as required. Patient was prepped and draped in the usual sterile fashion.          Clinical History: No specialty comments available.  He reports that he quit smoking about 38 years ago. His smoking use included cigarettes. He started smoking about 58 years ago. He has a 40 pack-year smoking history. He has never used smokeless tobacco. No results for input(s): "HGBA1C", "LABURIC" in the last 8760 hours.  Objective:  Physical Exam  Gen: Well-appearing, in no acute distress; non-toxic CV: Well-perfused. Warm.  Resp: Breathing unlabored on room air; no wheezing. Psych: Fluid speech in conversation; appropriate affect; normal thought process Neuro: Sensation intact throughout. No gross  coordination deficits.   Ortho Exam - Right shoulder: Very mild pain over the [facility] joint, positive TTP at Codman's point.  There is some pain with empty can testing and resisted external rotation.  Mildly positive Hawkins impingement testing.  There is 5/5 strength about the rotator cuff.  Imaging: XR Shoulder Right  Result Date: 03/30/2023 3 views of the right shoulder including AP, Grashey, axial view were ordered and reviewed by myself.  X-rays show mild AC joint arthritic change.  The humeral head is well located with minimal glenohumeral joint arthritis.  No acute fracture or otherwise bony abnormality noted.   Past Medical/Family/Surgical/Social History: Medications & Allergies reviewed per EMR, new medications updated. Patient Active Problem List   Diagnosis Date Noted   Arthritis of right ankle 02/04/2023   Post-traumatic osteoarthritis, right ankle and foot 02/18/2022   Toe pain, right 02/18/2022   Essential hypertension 08/28/2021   Low back pain 06/27/2021   Acute embolism and thrombosis of unspecified deep veins of unspecified lower extremity (HCC) 06/03/2021   Anal fistula 06/03/2021   Anxiety 06/03/2021   Erectile dysfunction 06/03/2021   Essential tremor 06/03/2021   History of lymphoma 06/03/2021   Impacted cerumen 06/03/2021   Peripheral neuropathy 06/03/2021   History of colonic polyps 06/03/2021   Rectal fistula 06/03/2021   Inflamed seborrheic keratosis 06/03/2021   Vitamin D deficiency 06/03/2021   Tremor 06/03/2021   Eustachian tube dysfunction, left 12/10/2020   Otalgia, left 12/10/2020   Nondisplaced fracture of distal phalanx of left middle finger, initial encounter for open fracture 09/18/2020   Pain in right hip 03/27/2020   Laceration of right ring finger 05/05/2019   Laceration of flexor muscle, fascia and tendon of right ring finger at wrist and hand level, initial encounter 04/23/2019   Unspecified fracture of the lower end of right radius, initial  encounter for closed fracture 04/23/2019   Fracture of unspecified phalanx of right ring finger, initial encounter for open fracture 04/23/2019   Left knee pain 06/14/2018   Pain in left ankle and joints of left foot 12/18/2017   Bilateral edema of lower extremity 10/30/2015   Onychomycosis 10/30/2015   Sepsis (HCC) 08/31/2015   History of pulmonary embolism    History of DVT (deep vein thrombosis)    Pyrexia    Cellulitis 03/18/2015   Chronic anticoagulation 03/17/2015   Morbid obesity with BMI of 45.0-49.9, adult (HCC) 03/17/2015   S/P IVC filter 03/17/2015   Genetic testing 01/12/2015   Postoperative wound infection 12/09/2013   Postop check 11/24/2013   Ulcer of lower limb (HCC) 08/24/2013   Inguinal lymphadenopathy 08/16/2013   Encounter for therapeutic drug monitoring 06/09/2013   Left leg cellulitis 04/04/2013   Osteoarthritis of left knee 11/02/2012   Cellulitis of right lower extremity 04/30/2012   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 04/29/2012   S/P splenectomy 04/29/2012   Incisional hernia 05/27/2011   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   Morbid obesity (HCC) 04/19/2008   Obstructive sleep apnea 04/19/2008   VARICOSE VEINS, LOWER EXTREMITIES 04/19/2008   PULMONARY EMBOLISM, HX OF 04/19/2008   Past Medical History:  Diagnosis Date   Anorectal fistula    Arthritis    Blood transfusion without reported diagnosis    Cancer (HCC)  lymphoma hx   Cataract    Cellulitis 04/04/2013   DVT (deep venous thrombosis) (HCC)    2011 right leg   History of lymphoma    History of pulmonary embolus (PE)    Incisional hernia    Morbid obesity (HCC)    OSA on CPAP    cpap  10 yrs   Varicose veins of lower extremities    pe   Family History  Problem Relation Age of Onset   Heart disease Father    Varicose Veins Father    Heart attack Father        dx. 22s   Heart disease Mother 54   Varicose Veins Mother    Pancreatic cancer Sister 49   Cancer  Maternal Grandmother        either stomach or pancreatic primary    Pancreatic cancer Maternal Grandfather        dx. 50s   Lung cancer Maternal Aunt        secondhand smoke exposure   Heart attack Paternal Uncle    Rectal cancer Maternal Aunt        dx. 80s   Cancer Maternal Aunt        unknown type   Diabetes Paternal Uncle    Cancer Cousin        unknown type   Breast cancer Cousin        dx. 12s   Past Surgical History:  Procedure Laterality Date   EYE SURGERY Left 03   cat, detached ret     FRACTURE SURGERY     HERNIA REPAIR     INGUINAL LYMPH NODE BIOPSY [name] 11/15/2013   Procedure: RIGHT INGUINAL LYMPH NODE BIOPSY;  Surgeon: Cherylynn Ridges, MD;  Location: [facility] OR;  Service: General;  Laterality: [name];   ivc filter     PROSTATE SURGERY     (patient denies)   SPLENECTOMY     TONSILLECTOMY     age 36   TOTAL KNEE ARTHROPLASTY Left 11/02/2012   Procedure: TOTAL KNEE ARTHROPLASTY with revision tibia;  Surgeon: Valeria Batman, MD;  Location[facility] OR;  Service: Orthopedics;  Laterality: Left;   Social History   Occupational History   Not on file  Tobacco Use   Smoking status: Former    Current packs/day: 0.00    Average packs/day: 2.0 packs/day for 20.0 years (40.0 ttl pk-yrs)    Types: Cigarettes    Start date: 05/05/1964    Quit date: 05/05/1984    Years since quitting: 38.9   Smokeless tobacco: Never  Vaping Use   Vaping status: Never Used  Substance and Sexual Activity   Alcohol use: Yes    Alcohol/week: 0.0 standard drinks of alcohol    Comment: maybe 1 beer/month   Drug use: No   Sexual activity: Yes    Birth control/protection: None

## 2023-03-30 NOTE — Progress Notes (Signed)
Patient says that he swims 6 days a week, and in the last 5-6 days he has had pain in his right shoulder. He says that pain feels sharp, and at rest mostly feels sore. He says that he has noticed that if he changes the way he swims (to where he has less overhead) it is less painful. He says that he has taken Tylenol with minimal relief.

## 2023-03-31 DIAGNOSIS — M545 Low back pain, unspecified: Secondary | ICD-10-CM | POA: Diagnosis not present

## 2023-03-31 DIAGNOSIS — M6281 Muscle weakness (generalized): Secondary | ICD-10-CM | POA: Diagnosis not present

## 2023-03-31 DIAGNOSIS — R262 Difficulty in walking, not elsewhere classified: Secondary | ICD-10-CM | POA: Diagnosis not present

## 2023-04-06 DIAGNOSIS — M205X2 Other deformities of toe(s) (acquired), left foot: Secondary | ICD-10-CM | POA: Diagnosis not present

## 2023-04-06 DIAGNOSIS — L84 Corns and callosities: Secondary | ICD-10-CM | POA: Diagnosis not present

## 2023-04-06 DIAGNOSIS — G603 Idiopathic progressive neuropathy: Secondary | ICD-10-CM | POA: Diagnosis not present

## 2023-04-06 DIAGNOSIS — S98112A Complete traumatic amputation of left great toe, initial encounter: Secondary | ICD-10-CM | POA: Diagnosis not present

## 2023-04-08 DIAGNOSIS — M6281 Muscle weakness (generalized): Secondary | ICD-10-CM | POA: Diagnosis not present

## 2023-04-08 DIAGNOSIS — R262 Difficulty in walking, not elsewhere classified: Secondary | ICD-10-CM | POA: Diagnosis not present

## 2023-04-08 DIAGNOSIS — M545 Low back pain, unspecified: Secondary | ICD-10-CM | POA: Diagnosis not present

## 2023-04-14 DIAGNOSIS — R600 Localized edema: Secondary | ICD-10-CM | POA: Diagnosis not present

## 2023-04-14 DIAGNOSIS — I83891 Varicose veins of right lower extremities with other complications: Secondary | ICD-10-CM | POA: Diagnosis not present

## 2023-04-14 DIAGNOSIS — I87391 Chronic venous hypertension (idiopathic) with other complications of right lower extremity: Secondary | ICD-10-CM | POA: Diagnosis not present

## 2023-04-14 DIAGNOSIS — R252 Cramp and spasm: Secondary | ICD-10-CM | POA: Diagnosis not present

## 2023-04-14 DIAGNOSIS — I8311 Varicose veins of right lower extremity with inflammation: Secondary | ICD-10-CM | POA: Diagnosis not present

## 2023-04-22 DIAGNOSIS — M6281 Muscle weakness (generalized): Secondary | ICD-10-CM | POA: Diagnosis not present

## 2023-04-22 DIAGNOSIS — R262 Difficulty in walking, not elsewhere classified: Secondary | ICD-10-CM | POA: Diagnosis not present

## 2023-04-22 DIAGNOSIS — M545 Low back pain, unspecified: Secondary | ICD-10-CM | POA: Diagnosis not present

## 2023-05-04 ENCOUNTER — Other Ambulatory Visit: Payer: Self-pay

## 2023-05-04 ENCOUNTER — Encounter: Payer: Self-pay | Admitting: Sports Medicine

## 2023-05-04 ENCOUNTER — Ambulatory Visit: Payer: Medicare Other | Admitting: Sports Medicine

## 2023-05-04 DIAGNOSIS — M25511 Pain in right shoulder: Secondary | ICD-10-CM | POA: Diagnosis not present

## 2023-05-04 MED ORDER — METHYLPREDNISOLONE ACETATE 40 MG/ML IJ SUSP
40.0000 mg | INTRAMUSCULAR | Status: AC | PRN
Start: 2023-05-04 — End: 2023-05-04
  Administered 2023-05-04: 40 mg via INTRA_ARTICULAR

## 2023-05-04 MED ORDER — LIDOCAINE HCL 1 % IJ SOLN
2.0000 mL | INTRAMUSCULAR | Status: AC | PRN
Start: 2023-05-04 — End: 2023-05-04
  Administered 2023-05-04: 2 mL

## 2023-05-04 MED ORDER — BUPIVACAINE HCL 0.25 % IJ SOLN
2.0000 mL | INTRAMUSCULAR | Status: AC | PRN
Start: 2023-05-04 — End: 2023-05-04
  Administered 2023-05-04: 2 mL via INTRA_ARTICULAR

## 2023-05-04 NOTE — Progress Notes (Signed)
Marc Mills - 77 y.o. male MRN [id number]  Date of birth: [birth year]  Office Visit Note: Visit Date: 05/04/2023 PCP: Andi Devon, MD Referred by: Andi Devon, MD  Subjective: Chief Complaint  Patient presents with   Right Shoulder - Pain   HPI: Marc Mills is a pleasant 77 y.o. male who presents today for chronic right shoulder pain.  Had a SAJ injection back on 03/30/23 - unfortunately he did not receive much relief from this injection. He has worked with [facility] in the past for PT and does have an upcoming appointment but would like a referral for this to work specifically on his shoulder.  He does swim 6 days a week as well as doing laps in the pool, he has had to alter how he swims with less overhead activity secondary to the pain. He has taken Tylenol without much relief.   Pertinent ROS were reviewed with the patient and found to be negative unless otherwise specified above in HPI.   Assessment & Plan: Visit Diagnoses:  1. Right shoulder pain, unspecified chronicity    Plan: Impression is chronic right shoulder pain which did not receive good relief from the subacromial joint injection.  His x-ray suggest more of a proximal biceps tendinopathy versus intra-articular pathology (but only mild osteoarthritis).  Through shared decision-making, did proceed with ultrasound-guided intra-articular shoulder injection, patient tolerated well.  He did have good relief following the anesthetic portion of this.  I did advise on 48 hours of modified rest/activity, may use Tylenol and ice/heat for any postinjection pain.  He will notify me how he is doing over the next week from the injection and after he performs a few sessions of physical therapy. If for some reason he is not receiving good relief from the above treatments, next step would likely be obtaining MRI of the shoulder.  Follow-up: Return for Will notify me of improvement s/p injection.   Meds &  Orders: No orders of the defined types were placed in this encounter.   Orders Placed This Encounter  Procedures   Large Joint Inj   US Guided Needle Placement - No Linked Charges     Procedures: Large Joint Inj: R glenohumeral on 05/04/2023 1:22 PM Indications: pain Details: 22 G 3.5 in needle, ultrasound-guided posterior approach Medications: 2 mL lidocaine 1 %; 2 mL bupivacaine 0.25 %; 40 mg methylPREDNISolone acetate 40 MG/ML Outcome: tolerated well, no immediate complications  US-guided glenohumeral joint injection, right shoulder After discussion on risks/benefits/indications, informed verbal consent was obtained. A timeout was then performed. The patient was positioned lying lateral recumbent on examination table. The patient's shoulder was prepped with betadine and multiple alcohol swabs and utilizing ultrasound guidance, the patient's glenohumeral joint was identified on ultrasound. Using ultrasound guidance a 22-gauge, 3.5 inch needle with a mixture of 2:2:1 cc's lidocaine:bupivicaine:depomedrol was directed from a lateral to medial direction via in-plane technique into the glenohumeral joint with visualization of appropriate spread of injectate into the joint. Patient tolerated the procedure well without immediate complications.      Procedure, treatment alternatives, risks and benefits explained, specific risks discussed. Consent was given by the patient. Immediately prior to procedure a time out was called to verify the correct patient, procedure, equipment, support staff and site/side marked as required. Patient was prepped and draped in the usual sterile fashion.          Clinical History: No specialty comments available.  He reports that he quit smoking about 39 years  ago. His smoking use included cigarettes. He started smoking about 59 years ago. He has a 40 pack-year smoking history. He has never used smokeless tobacco. No results for input(s): "HGBA1C", "LABURIC" in  the last 8760 hours.  Objective:    Physical Exam  Gen: Well-appearing, in no acute distress; non-toxic CV: Well-perfused. Warm.  Resp: Breathing unlabored on room air; no wheezing. Psych: Fluid speech in conversation; appropriate affect; normal thought process  Ortho Exam - Right shoulder: + TTP over the bicipital groove and anterior joint recess of the shoulder.  Mildly positive speeds test.  There is no bony restriction with active or passive range of motion about the shoulder.  Imaging:  03/30/23: XR Shoulder Right 3 views of the right shoulder including AP, Grashey, axial view were  ordered and reviewed by myself.  X-rays show mild AC joint arthritic  change.  The humeral head is well located with minimal glenohumeral joint  arthritis.  No acute fracture or otherwise bony abnormality noted.  Past Medical/Family/Surgical/Social History: Medications & Allergies reviewed per EMR, new medications updated. Patient Active Problem List   Diagnosis Date Noted   Arthritis of right ankle 02/04/2023   Post-traumatic osteoarthritis, right ankle and foot 02/18/2022   Toe pain, right 02/18/2022   Essential hypertension 08/28/2021   Low back pain 06/27/2021   Acute embolism and thrombosis of unspecified deep veins of unspecified lower extremity (HCC) 06/03/2021   Anal fistula 06/03/2021   Anxiety 06/03/2021   Erectile dysfunction 06/03/2021   Essential tremor 06/03/2021   History of lymphoma 06/03/2021   Impacted cerumen 06/03/2021   Peripheral neuropathy 06/03/2021   History of colonic polyps 06/03/2021   Rectal fistula 06/03/2021   Inflamed seborrheic keratosis 06/03/2021   Vitamin D deficiency 06/03/2021   Tremor 06/03/2021   Eustachian tube dysfunction, left 12/10/2020   Otalgia, left 12/10/2020   Nondisplaced fracture of distal phalanx of left middle finger, initial encounter for open fracture 09/18/2020   Pain in right hip 03/27/2020   Laceration of right ring finger  05/05/2019   Laceration of flexor muscle, fascia and tendon of right ring finger at wrist and hand level, initial encounter 04/23/2019   Unspecified fracture of the lower end of right radius, initial encounter for closed fracture 04/23/2019   Fracture of unspecified phalanx of right ring finger, initial encounter for open fracture 04/23/2019   Left knee pain 06/14/2018   Pain in left ankle and joints of left foot 12/18/2017   Bilateral edema of lower extremity 10/30/2015   Onychomycosis 10/30/2015   Sepsis (HCC) 08/31/2015   History of pulmonary embolism    History of DVT (deep vein thrombosis)    Pyrexia    Cellulitis 03/18/2015   Chronic anticoagulation 03/17/2015   Morbid obesity with BMI of 45.0-49.9, adult (HCC) 03/17/2015   S/P IVC filter 03/17/2015   Genetic testing 01/12/2015   Postoperative wound infection 12/09/2013   Postop check 11/24/2013   Ulcer of lower limb (HCC) 08/24/2013   Inguinal lymphadenopathy 08/16/2013   Encounter for therapeutic drug monitoring 06/09/2013   Left leg cellulitis 04/04/2013   Osteoarthritis of left knee 11/02/2012   Cellulitis of right lower extremity 04/30/2012   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 04/29/2012   S/P splenectomy 04/29/2012   Incisional hernia 05/27/2011   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   Morbid obesity (HCC) 04/19/2008   Obstructive sleep apnea 04/19/2008   VARICOSE VEINS, LOWER EXTREMITIES 04/19/2008   PULMONARY EMBOLISM, HX OF 04/19/2008   Past Medical  History:  Diagnosis Date   Anorectal fistula    Arthritis    Blood transfusion without reported diagnosis    Cancer (HCC)    lymphoma hx   Cataract    Cellulitis 04/04/2013   DVT (deep venous thrombosis) (HCC)    2011 right leg   History of lymphoma    History of pulmonary embolus (PE)    Incisional hernia    Morbid obesity (HCC)    OSA on CPAP    cpap  10 yrs   Varicose veins of lower extremities    pe   Family History  Problem  Relation Age of Onset   Heart disease Father    Varicose Veins Father    Heart attack Father        dx. 61s   Heart disease Mother 72   Varicose Veins Mother    Pancreatic cancer Sister 88   Cancer Maternal Grandmother        either stomach or pancreatic primary    Pancreatic cancer Maternal Grandfather        dx. 50s   Lung cancer Maternal Aunt        secondhand smoke exposure   Heart attack Paternal Uncle    Rectal cancer Maternal Aunt        dx. 80s   Cancer Maternal Aunt        unknown type   Diabetes Paternal Uncle    Cancer Cousin        unknown type   Breast cancer Cousin        dx. 30s   Past Surgical History:  Procedure Laterality Date   EYE SURGERY Left 03   cat, detached ret     FRACTURE SURGERY     HERNIA REPAIR     INGUINAL LYMPH NODE BIOPSY [name] 11/15/2013   Procedure: RIGHT INGUINAL LYMPH NODE BIOPSY;  Surgeon: Cherylynn Ridges, MD;  Location: [facility] OR;  Service: General;  Laterality: [name];   ivc filter     PROSTATE SURGERY     (patient denies)   SPLENECTOMY     TONSILLECTOMY     age 35   TOTAL KNEE ARTHROPLASTY Left 11/02/2012   Procedure: TOTAL KNEE ARTHROPLASTY with revision tibia;  Surgeon: Valeria Batman, MD;  [facility];  Service: Orthopedics;  Laterality: Left;   Social History   Occupational History   Not on file  Tobacco Use   Smoking status: Former    Current packs/day: 0.00    Average packs/day: 2.0 packs/day for 20.0 years (40.0 ttl pk-yrs)    Types: Cigarettes    Start date: 05/05/1964    Quit date: 05/05/1984    Years since quitting: 39.0   Smokeless tobacco: Never  Vaping Use   Vaping status: Never Used  Substance and Sexual Activity   Alcohol use: Yes    Alcohol/week: 0.0 standard drinks of alcohol    Comment: maybe 1 beer/month   Drug use: No   Sexual activity: Yes    Birth control/protection: None

## 2023-05-07 IMAGING — MR MR LUMBAR SPINE W/O CM
4 of 5 series · 18 of 48 positions shown · non-contrast
Comparison: Lumbar radiographs dated 1 day prior, CT abdomen/pelvis
09/11/2009

CLINICAL DATA: Chronic right low back pain

EXAM:
MRI LUMBAR SPINE WITHOUT CONTRAST
TECHNIQUE: Multiplanar, multisequence MR imaging of the lumbar spine was
performed. No intravenous contrast was administered.

[Series 5: T2 · sagittal · 4.0mm · 0.73mm/px · 6 of 15 slices shown (1 of 2)]
[im 1/15]
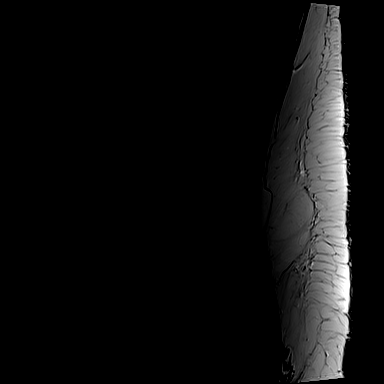
[im 3/15]
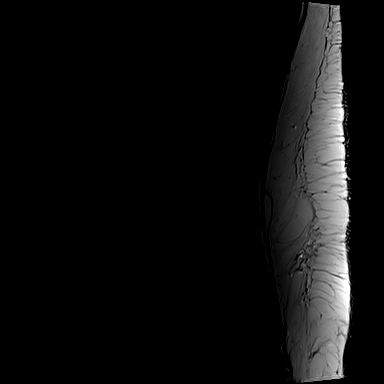
[im 6/15]
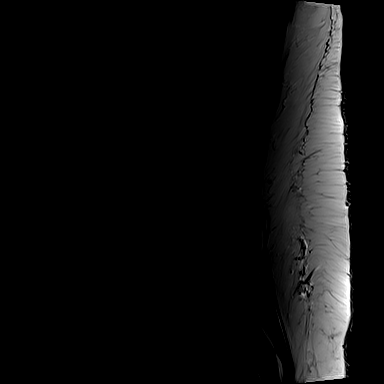
[im 9/15]
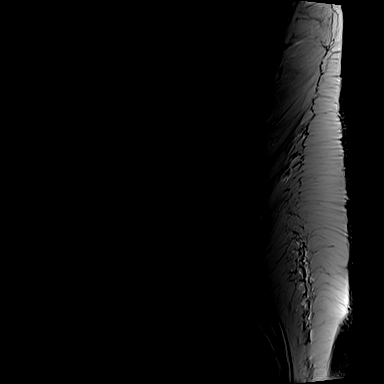
[im 12/15]
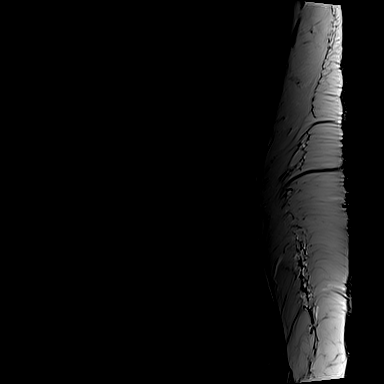
[im 15/15]
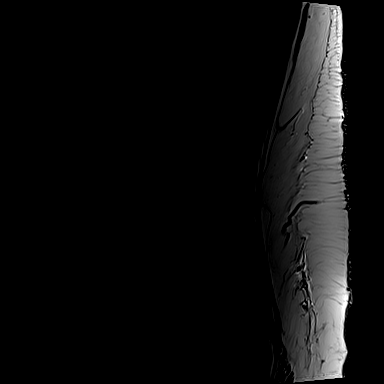

[Series 6: T1 · sagittal · 4.0mm · 0.88mm/px · 3 of 15 slices shown (1 of 2)]
[im 1/15]
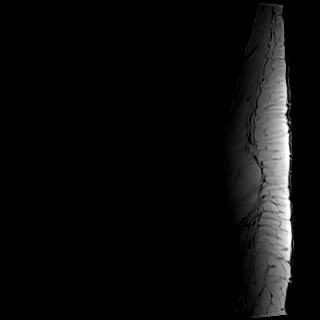
[im 8/15]
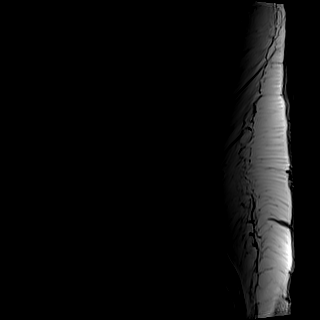
[im 15/15]
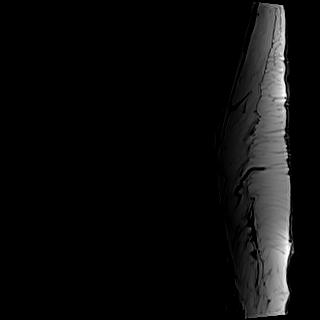

[Series 10: T1 · axial · 4.0mm · 0.28mm/px · z∈[-121,+72]mm · 3 of 43 slices shown (2 of 2)]
[im 6/43]
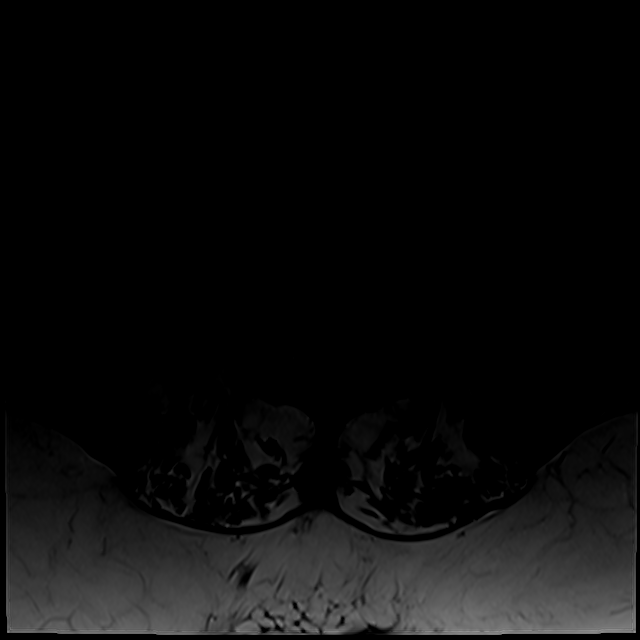
[im 23/43]
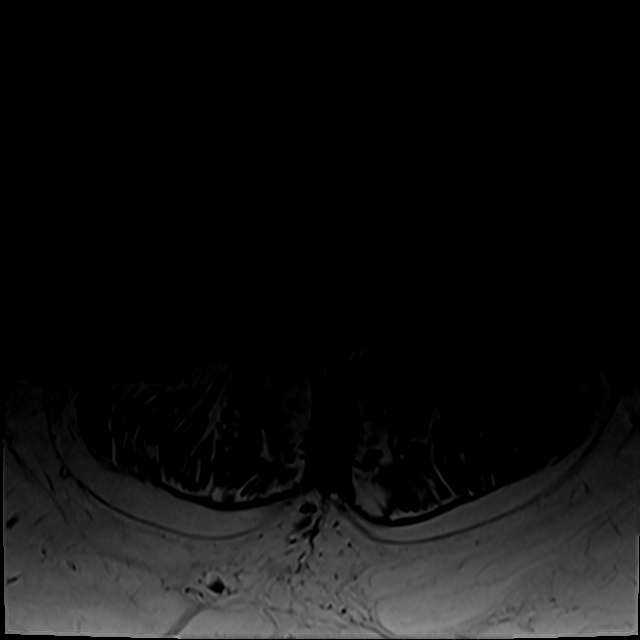
[im 37/43]
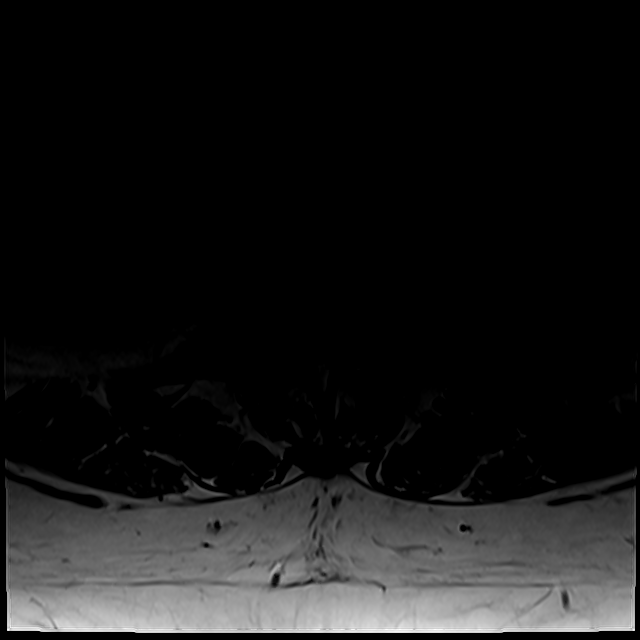

[Series 13: T2 · axial · 4.0mm · 0.28mm/px · z∈[-135,+72]mm · 6 of 43 slices shown (2 of 2)]
[im 3/43]
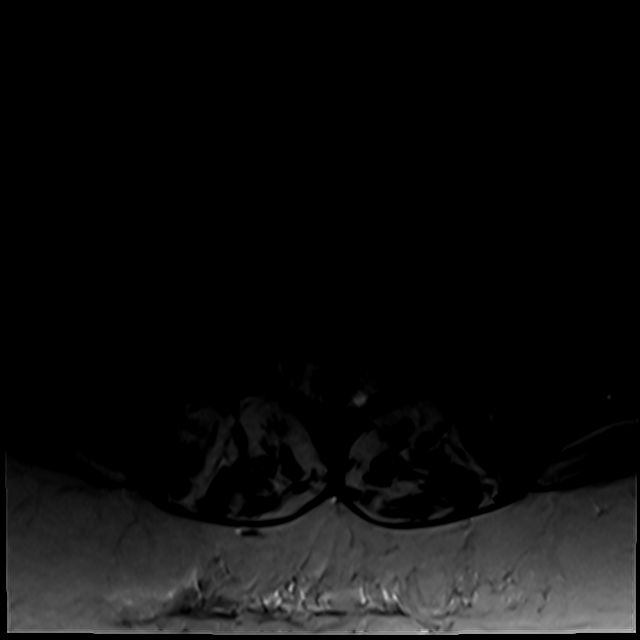
[im 6/43]
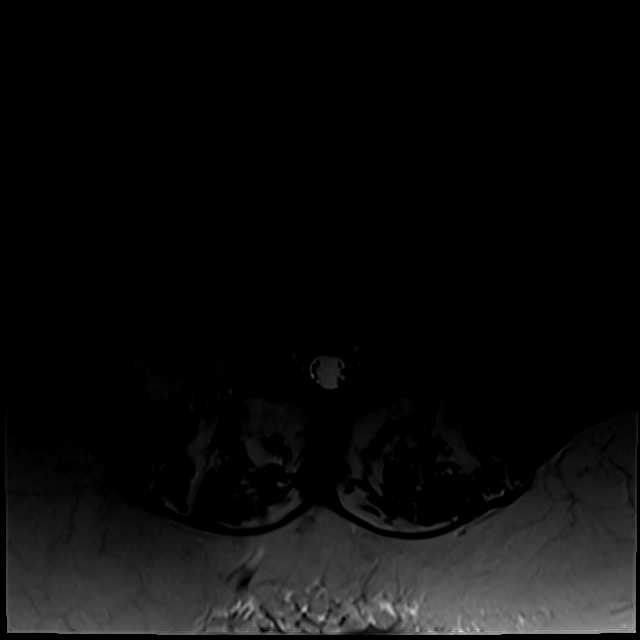
[im 9/43]
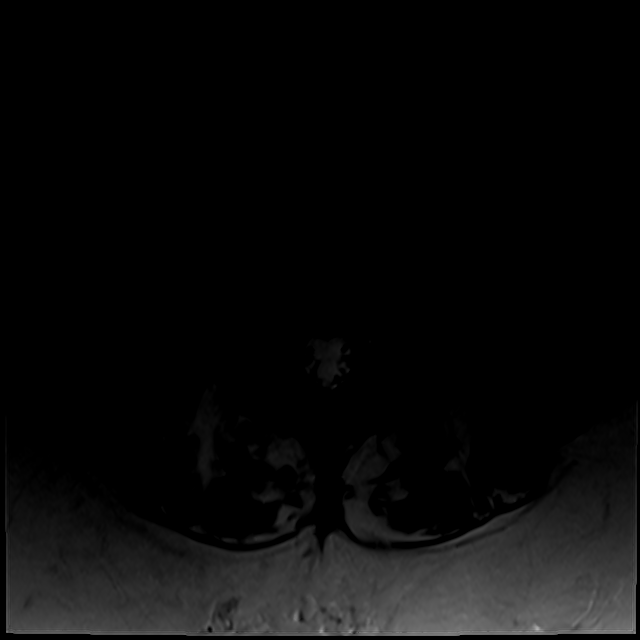
[im 15/43]
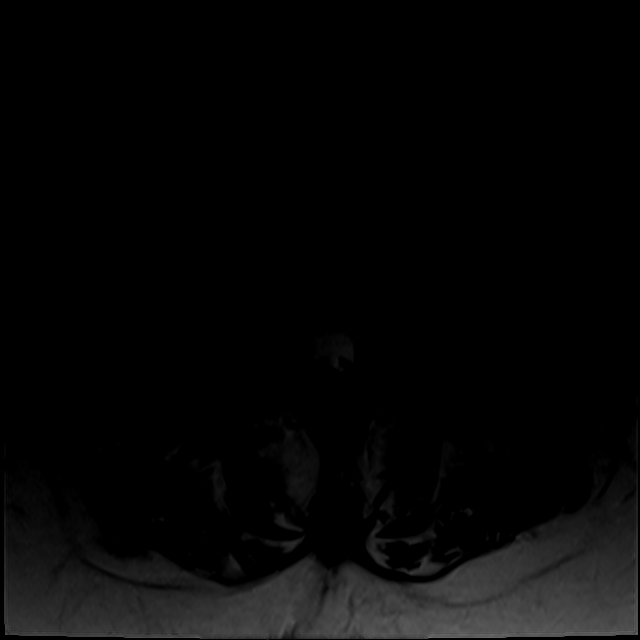
[im 23/43]
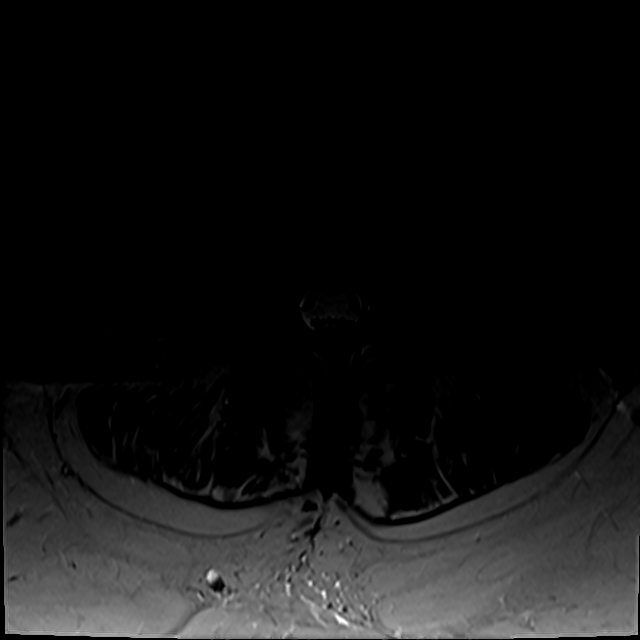
[im 37/43]
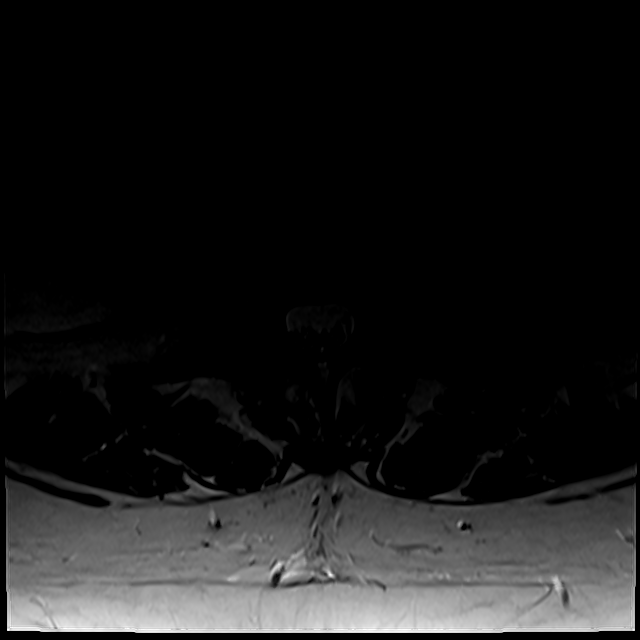

[18 of 48 positions shown; findings below may reference images not displayed]

FINDINGS: Segmentation: Standard; the lowest formed disc space is designated
L5-S1

Alignment: There is grade 1 retrolisthesis of T12 on L1. There is 11
mm grade 2 anterolisthesis of L4 on L5 with associated bilateral
pars defects, increased from approximately 9 mm in 8911. Alignment
is otherwise normal.

Vertebrae: Vertebral body heights are preserved. Foci of T1 and T2
hyperintensity in the T12, L1, and L2 vertebral bodies likely
reflect intraosseous hemangiomas. There is no suspicious marrow
signal abnormality. There is no marrow edema.

Conus medullaris and cauda equina: Conus extends to the T12-L1
level. Conus and cauda equina appear normal.

Paraspinal and other soft tissues: T2 hyperintense lesions in both
kidneys are noted, likely cysts. The paraspinal soft tissues are
unremarkable.

Disc levels:

There is marked intervertebral disc space narrowing at L4-L5 with
essentially complete obliteration of the disc space. There is also
disc desiccation and narrowing at T12-L1. The other disc heights
appear overall preserved.

T12-L1: There is grade 1 retrolisthesis with a minimal bulge and
mild bilateral facet arthropathy without significant spinal canal or
neural foraminal stenosis.

L1-L2: There is a mild disc bulge and mild bilateral facet
arthropathy without significant spinal canal or neural foraminal
stenosis.

L2-L3: Mild bilateral facet arthropathy without significant spinal
canal or neural foraminal stenosis.

L3-L4: Mild bilateral facet arthropathy without significant spinal
canal or neural foraminal stenosis.

L4-L5: There is grade 2 anterolisthesis with uncovering of the disc
posteriorly, and advanced bilateral facet arthropathy resulting in
severe bilateral neural foraminal stenosis with possible impingement
of either exiting L4 nerve root, left worse than right. There is no
significant spinal canal stenosis or evidence of impingement of the
traversing nerve roots.

L5-S1: Mild bilateral facet arthropathy without significant spinal
canal or neural foraminal stenosis.
IMPRESSION: 1. Grade 2 anterolisthesis of L4 on L5 with associated bilateral
pars defects, slightly increased since 8911. There is associated
advanced facet arthropathy, resulting in severe bilateral neural
foraminal stenosis, left worse than right, with possible impingement
of either exiting L4 nerve root.
2. Mild degenerative changes throughout the remainder of the lumbar
spine as detailed above without other significant spinal canal or
neural foraminal stenosis.

## 2023-06-18 ENCOUNTER — Encounter: Payer: Self-pay | Admitting: Sports Medicine

## 2023-06-19 ENCOUNTER — Other Ambulatory Visit (INDEPENDENT_AMBULATORY_CARE_PROVIDER_SITE_OTHER): Payer: Self-pay

## 2023-06-19 ENCOUNTER — Encounter: Payer: Self-pay | Admitting: Physician Assistant

## 2023-06-19 ENCOUNTER — Ambulatory Visit (INDEPENDENT_AMBULATORY_CARE_PROVIDER_SITE_OTHER): Payer: Medicare Other | Admitting: Physician Assistant

## 2023-06-19 ENCOUNTER — Encounter: Payer: Self-pay | Admitting: Oncology

## 2023-06-19 DIAGNOSIS — M25552 Pain in left hip: Secondary | ICD-10-CM

## 2023-06-19 DIAGNOSIS — M7062 Trochanteric bursitis, left hip: Secondary | ICD-10-CM | POA: Insufficient documentation

## 2023-06-19 MED ORDER — METHYLPREDNISOLONE ACETATE 40 MG/ML IJ SUSP
40.0000 mg | INTRAMUSCULAR | Status: AC | PRN
Start: 2023-06-19 — End: 2023-06-19
  Administered 2023-06-19: 40 mg via INTRA_ARTICULAR

## 2023-06-19 MED ORDER — BUPIVACAINE HCL 0.25 % IJ SOLN
2.0000 mL | INTRAMUSCULAR | Status: AC | PRN
Start: 2023-06-19 — End: 2023-06-19
  Administered 2023-06-19: 2 mL via INTRA_ARTICULAR

## 2023-06-19 MED ORDER — LIDOCAINE HCL 1 % IJ SOLN
2.0000 mL | INTRAMUSCULAR | Status: AC | PRN
Start: 2023-06-19 — End: 2023-06-19
  Administered 2023-06-19: 2 mL

## 2023-06-19 NOTE — Progress Notes (Addendum)
Office Visit Note   Patient: Marc Mills           Date of Birth: 07-25-1945           MRN: [id number] Visit Date: 06/19/2023              Requested by: Andi Devon, MD [address] [place],  [place] [address] PCP: Andi Devon, MD   Assessment & Plan: Visit Diagnoses:  1. Pain in left hip   2. Trochanteric bursitis, left hip     Plan: Marc Mills comes in today complaining of a 5-day history of left lateral hip pain.  Denies any injuries pain is focally on the lateral hip denies any back pain groin pain or radiculopathy.  He states that he thinks this was secondary to getting a new pair of sneakers.  He noticed that he wore them as the days went on he got more more painful.  X-rays do not show significant arthritis he is focally tender over the trochanteric bursa.  We talked about the natural history of this we will go forward with an injection today.  He is already working with physical therapy for other various issues and we will add an order for IT band stretching.  Also patient also provided with exercises he can do on his own  Follow-Up Instructions: Return if symptoms worsen or fail to improve.   Orders:  Orders Placed This Encounter  Procedures   Large Joint Inj: L greater trochanter   XR HIP UNILAT W OR W/O PELVIS 2-3 VIEWS LEFT   Ambulatory referral to Physical Therapy   Meds ordered this encounter  Medications   bupivacaine (MARCAINE) 0.25 % (with pres) injection 2 mL   lidocaine (XYLOCAINE) 1 % (with pres) injection 2 mL   methylPREDNISolone acetate (DEPO-MEDROL) injection 40 mg      Procedures: Large Joint Inj: L greater trochanter on 06/19/2023 9:03 AM Indications: pain and diagnostic evaluation Details: 22 G 3.5 in needle, lateral approach  Arthrogram: No  Medications: 2 mL lidocaine 1 %; 40 mg methylPREDNISolone acetate 40 MG/ML; 2 mL bupivacaine 0.25 % Outcome: tolerated well, no immediate complications Procedure, treatment  alternatives, risks and benefits explained, specific risks discussed. Consent was given by the patient.       Clinical Data: No additional findings.   Subjective: Chief Complaint  Patient presents with   Left Hip - Pain    HPI Patient is a pleasant 78 year old gentleman who comes in today with left lateral hip pain.  He said the pain started about 5 days ago when he started wearing some new shoes.  Posterior lateral pain is tender to the touch described as throbbing and constant it is causing him to have limping.  He has not had any surgery or injuries to this hip  Review of Systems  All other systems reviewed and are negative.    Objective: Vital Signs: There were no vitals taken for this visit.  Physical Exam Constitutional:      Appearance: Normal appearance.  Pulmonary:     Effort: Pulmonary effort is normal.  Skin:    General: Skin is warm and dry.  Neurological:     General: No focal deficit present.     Mental Status: He is alert and oriented to person, place, and time.  Psychiatric:        Mood and Affect: Mood normal.        Behavior: Behavior normal.     Ortho Exam Examination  he is ambulating with an antalgic gait and cane.  No tenderness over his lower back no tenderness in the groin number with manipulation of the hip.  He is focally tender over the trochanteric bursa.  No radicular findings strength is intact Specialty Comments:  No specialty comments available.  Imaging: XR HIP UNILAT W OR W/O PELVIS 2-3 VIEWS LEFT Result Date: 06/19/2023 Radiographs of his left hip were taken today he has some sclerotic changes but no advanced degenerative changes no evidence of fracture    PMFS History: Patient Active Problem List   Diagnosis Date Noted   Trochanteric bursitis, left hip 06/19/2023   Arthritis of right ankle 02/04/2023   Post-traumatic osteoarthritis, right ankle and foot 02/18/2022   Toe pain, right 02/18/2022   Essential hypertension  08/28/2021   Low back pain 06/27/2021   Acute embolism and thrombosis of unspecified deep veins of unspecified lower extremity (HCC) 06/03/2021   Anal fistula 06/03/2021   Anxiety 06/03/2021   Erectile dysfunction 06/03/2021   Essential tremor 06/03/2021   History of lymphoma 06/03/2021   Impacted cerumen 06/03/2021   Peripheral neuropathy 06/03/2021   History of colonic polyps 06/03/2021   Rectal fistula 06/03/2021   Inflamed seborrheic keratosis 06/03/2021   Vitamin D deficiency 06/03/2021   Tremor 06/03/2021   Eustachian tube dysfunction, left 12/10/2020   Otalgia, left 12/10/2020   Nondisplaced fracture of distal phalanx of left middle finger, initial encounter for open fracture 09/18/2020   Pain in left hip 03/27/2020   Laceration of right ring finger 05/05/2019   Laceration of flexor muscle, fascia and tendon of right ring finger at wrist and hand level, initial encounter 04/23/2019   Unspecified fracture of the lower end of right radius, initial encounter for closed fracture 04/23/2019   Fracture of unspecified phalanx of right ring finger, initial encounter for open fracture 04/23/2019   Left knee pain 06/14/2018   Pain in left ankle and joints of left foot 12/18/2017   Bilateral edema of lower extremity 10/30/2015   Onychomycosis 10/30/2015   Sepsis (HCC) 08/31/2015   History of pulmonary embolism    History of DVT (deep vein thrombosis)    Pyrexia    Cellulitis 03/18/2015   Chronic anticoagulation 03/17/2015   Morbid obesity with BMI of 45.0-49.9, adult (HCC) 03/17/2015   S/P IVC filter 03/17/2015   Genetic testing 01/12/2015   Postoperative wound infection 12/09/2013   Postop check 11/24/2013   Ulcer of lower limb (HCC) 08/24/2013   Inguinal lymphadenopathy 08/16/2013   Encounter for therapeutic drug monitoring 06/09/2013   Left leg cellulitis 04/04/2013   Osteoarthritis of left knee 11/02/2012   Cellulitis of right lower extremity 04/30/2012   VTE in remission/  chronic coumadin 04/29/2012   S/P splenectomy 04/29/2012   Incisional hernia 05/27/2011   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   Morbid obesity (HCC) 04/19/2008   Obstructive sleep apnea 04/19/2008   VARICOSE VEINS, LOWER EXTREMITIES 04/19/2008   PULMONARY EMBOLISM, HX OF 04/19/2008   Past Medical History:  Diagnosis Date   Anorectal fistula    Arthritis    Blood transfusion without reported diagnosis    Cancer (HCC)    lymphoma hx   Cataract    Cellulitis 04/04/2013   DVT (deep venous thrombosis) (HCC)    2011 right leg   History of lymphoma    History of pulmonary embolus (PE)    Incisional hernia    Morbid obesity (HCC)    OSA on CPAP  cpap  10 yrs   Varicose veins of lower extremities    pe    Family History  Problem Relation Age of Onset   Heart disease Father    Varicose Veins Father    Heart attack Father        dx. 19s   Heart disease Mother 64   Varicose Veins Mother    Pancreatic cancer Sister 69   Cancer Maternal Grandmother        either stomach or pancreatic primary    Pancreatic cancer Maternal Grandfather        dx. 50s   Lung cancer Maternal Aunt        secondhand smoke exposure   Heart attack Paternal Uncle    Rectal cancer Maternal Aunt        dx. 80s   Cancer Maternal Aunt        unknown type   Diabetes Paternal Uncle    Cancer Cousin        unknown type   Breast cancer Cousin        dx. 59s    Past Surgical History:  Procedure Laterality Date   EYE SURGERY Left 03   cat, detached ret     FRACTURE SURGERY     HERNIA REPAIR     INGUINAL LYMPH NODE BIOPSY [name] 11/15/2013   Procedure: RIGHT INGUINAL LYMPH NODE BIOPSY;  Surgeon: Cherylynn Ridges, MD;  Location: [facility] OR;  Service: General;  Laterality: [name];   ivc filter     PROSTATE SURGERY     (patient denies)   SPLENECTOMY     TONSILLECTOMY     age 27   TOTAL KNEE ARTHROPLASTY Left 11/02/2012   Procedure: TOTAL KNEE ARTHROPLASTY with revision tibia;  Surgeon: Valeria Batman, MD;  Location: [facility] OR;  Service: Orthopedics;  Laterality: Left;   Social History   Occupational History   Not on file  Tobacco Use   Smoking status: Former    Current packs/day: 0.00    Average packs/day: 2.0 packs/day for 20.0 years (40.0 ttl pk-yrs)    Types: Cigarettes    Start date: 05/05/1964    Quit date: 05/05/1984    Years since quitting: 39.1   Smokeless tobacco: Never  Vaping Use   Vaping status: Never Used  Substance and Sexual Activity   Alcohol use: Yes    Alcohol/week: 0.0 standard drinks of alcohol    Comment: maybe 1 beer/month   Drug use: No   Sexual activity: Yes    Birth control/protection: None

## 2023-07-28 ENCOUNTER — Encounter: Payer: Self-pay | Admitting: Sports Medicine

## 2023-07-28 ENCOUNTER — Ambulatory Visit (INDEPENDENT_AMBULATORY_CARE_PROVIDER_SITE_OTHER): Admitting: Sports Medicine

## 2023-07-28 DIAGNOSIS — M25511 Pain in right shoulder: Secondary | ICD-10-CM | POA: Diagnosis not present

## 2023-07-28 DIAGNOSIS — M12811 Other specific arthropathies, not elsewhere classified, right shoulder: Secondary | ICD-10-CM

## 2023-07-28 DIAGNOSIS — G8929 Other chronic pain: Secondary | ICD-10-CM

## 2023-07-28 NOTE — Progress Notes (Signed)
 Marc Mills - 78 y.o. male MRN [id number]  Date of birth: [birth year]  Office Visit Note: Visit Date: 07/28/2023 PCP: Andi Devon, MD Referred by: Andi Devon, MD  Subjective: Chief Complaint  Patient presents with   Right Shoulder - Follow-up   HPI: Marc Mills is a pleasant 78 y.o. male who presents today for here for follow-up of right shoulder.   Marc Mills is unfortunately still having quite a bit of shoulder pain which is limiting his activity.  He did not receive relief from subacromial joint injection months ago, about 3 months ago we did perform ultrasound-guided right glenohumeral joint injection which helped him some, but certainly never got rid of his pain.  He has been very active with formalized physical therapy with Ellamae Sia and team, as well as continuing swimming but still having persistent pain.  He is getting some snapping or clicking over the top of the shoulder with overhead activity.  Using Tylenol, he does have tramadol to take for breakthrough pain but does not use this usually.  Pertinent ROS were reviewed with the patient and found to be negative unless otherwise specified above in HPI.   Assessment & Plan: Visit Diagnoses:  1. Chronic right shoulder pain   2. Rotator cuff arthropathy of right shoulder    Plan: Impression is chronic and recalcitrant right shoulder pain which has failed oral medication, injection therapy and physical therapy.  His exam is very suggestive of rotator cuff tearing, specifically of the supraspinatus and possibly involvement with the infraspinatus.  He is slightly symptomatic over the proximal bicep tendon as well.  His strength is relatively well-preserved other than resisted external rotation, which likely is in part from his dedicated physical therapy and compensation.  At this point, we will move forward with MRI of the right shoulder to evaluate the rotator cuff and other pathology within the shoulder.  I  did have a brief discussion with him regarding general overview of what rotator cuff repair and/or arthroscopic surgery would entail, if this is indeed require this.  However we will move forward with MRI and he will follow-up 1 week after to review and discuss next steps.  Okay for Tylenol and he may take tramadol only as needed for breakthrough pain in the interim.  Follow-up: Return for f/u 1-week after MRI R-shoulder .   Meds & Orders: No orders of the defined types were placed in this encounter.  No orders of the defined types were placed in this encounter.    Procedures: No procedures performed      Clinical History: No specialty comments available.  He reports that he quit smoking about 39 years ago. His smoking use included cigarettes. He started smoking about 59 years ago. He has a 40 pack-year smoking history. He has never used smokeless tobacco. No results for input(s): "HGBA1C", "LABURIC" in the last 8760 hours.  Objective:    Physical Exam  Gen: Well-appearing, in no acute distress; non-toxic CV: Well-perfused. Warm.  Resp: Breathing unlabored on room air; no wheezing. Psych: Fluid speech in conversation; appropriate affect; normal thought process  Ortho Exam - Right shoulder: + TTP overlying the mid to distal aspect of the supraspinatus and pain at Codman's point.  There is pain at the proximal bicep tendon groove.  No redness swelling or gross effusion.  He has full active and passive range of motion.  Positive empty can, positive pain and weakness with external rotation.  Positive speeds test.   Imaging:  03/30/23: 3 views of the right shoulder including AP, Grashey, axial view were  ordered and reviewed by myself.  X-rays show mild AC joint arthritic  change.  The humeral head is well located with minimal glenohumeral joint  arthritis.  No acute fracture or otherwise bony abnormality noted.   Past Medical/Family/Surgical/Social History: Medications & Allergies  reviewed per EMR, new medications updated. Patient Active Problem List   Diagnosis Date Noted   Trochanteric bursitis, left hip 06/19/2023   Arthritis of right ankle 02/04/2023   Post-traumatic osteoarthritis, right ankle and foot 02/18/2022   Toe pain, right 02/18/2022   Essential hypertension 08/28/2021   Low back pain 06/27/2021   Acute embolism and thrombosis of unspecified deep veins of unspecified lower extremity (HCC) 06/03/2021   Anal fistula 06/03/2021   Anxiety 06/03/2021   Erectile dysfunction 06/03/2021   Essential tremor 06/03/2021   History of lymphoma 06/03/2021   Impacted cerumen 06/03/2021   Peripheral neuropathy 06/03/2021   History of colonic polyps 06/03/2021   Rectal fistula 06/03/2021   Inflamed seborrheic keratosis 06/03/2021   Vitamin D deficiency 06/03/2021   Tremor 06/03/2021   Eustachian tube dysfunction, left 12/10/2020   Otalgia, left 12/10/2020   Nondisplaced fracture of distal phalanx of left middle finger, initial encounter for open fracture 09/18/2020   Pain in left hip 03/27/2020   Laceration of right ring finger 05/05/2019   Laceration of flexor muscle, fascia and tendon of right ring finger at wrist and hand level, initial encounter 04/23/2019   Unspecified fracture of the lower end of right radius, initial encounter for closed fracture 04/23/2019   Fracture of unspecified phalanx of right ring finger, initial encounter for open fracture 04/23/2019   Left knee pain 06/14/2018   Pain in left ankle and joints of left foot 12/18/2017   Bilateral edema of lower extremity 10/30/2015   Onychomycosis 10/30/2015   Sepsis (HCC) 08/31/2015   History of pulmonary embolism    History of DVT (deep vein thrombosis)    Pyrexia    Cellulitis 03/18/2015   Chronic anticoagulation 03/17/2015   Morbid obesity with BMI of 45.0-49.9, adult (HCC) 03/17/2015   S/P IVC filter 03/17/2015   Genetic testing 01/12/2015   Postoperative wound infection 12/09/2013    Postop check 11/24/2013   Ulcer of lower limb (HCC) 08/24/2013   Inguinal lymphadenopathy 08/16/2013   Encounter for therapeutic drug monitoring 06/09/2013   Left leg cellulitis 04/04/2013   Osteoarthritis of left knee 11/02/2012   Cellulitis of right lower extremity 04/30/2012   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 04/29/2012   S/P splenectomy 04/29/2012   Incisional hernia 05/27/2011   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   Morbid obesity (HCC) 04/19/2008   Obstructive sleep apnea 04/19/2008   VARICOSE VEINS, LOWER EXTREMITIES 04/19/2008   PULMONARY EMBOLISM, HX OF 04/19/2008   Past Medical History:  Diagnosis Date   Anorectal fistula    Arthritis    Blood transfusion without reported diagnosis    Cancer (HCC)    lymphoma hx   Cataract    Cellulitis 04/04/2013   DVT (deep venous thrombosis) (HCC)    2011 right leg   History of lymphoma    History of pulmonary embolus (PE)    Incisional hernia    Morbid obesity (HCC)    OSA on CPAP    cpap  10 yrs   Varicose veins of lower extremities    pe   Family History  Problem Relation Age of Onset   Heart  disease Father    Varicose Veins Father    Heart attack Father        dx. 65s   Heart disease Mother 80   Varicose Veins Mother    Pancreatic cancer Sister 34   Cancer Maternal Grandmother        either stomach or pancreatic primary    Pancreatic cancer Maternal Grandfather        dx. 50s   Lung cancer Maternal Aunt        secondhand smoke exposure   Heart attack Paternal Uncle    Rectal cancer Maternal Aunt        dx. 80s   Cancer Maternal Aunt        unknown type   Diabetes Paternal Uncle    Cancer Cousin        unknown type   Breast cancer Cousin        dx. 62s   Past Surgical History:  Procedure Laterality Date   EYE SURGERY Left 03   cat, detached ret     FRACTURE SURGERY     HERNIA REPAIR     INGUINAL LYMPH NODE BIOPSY [name] 11/15/2013   Procedure: RIGHT INGUINAL LYMPH NODE BIOPSY;   Surgeon: Cherylynn Ridges, MD;  Location: [facility] OR;  Service: General;  Laterality: [name];   ivc filter     PROSTATE SURGERY     (patient denies)   SPLENECTOMY     TONSILLECTOMY     age 57   TOTAL KNEE ARTHROPLASTY Left 11/02/2012   Procedure: TOTAL KNEE ARTHROPLASTY with revision tibia;  Surgeon: Valeria Batman, MD;  Location[facility];  Service: Orthopedics;  Laterality: Left;   Social History   Occupational History   Not on file  Tobacco Use   Smoking status: Former    Current packs/day: 0.00    Average packs/day: 2.0 packs/day for 20.0 years (40.0 ttl pk-yrs)    Types: Cigarettes    Start date: 05/05/1964    Quit date: 05/05/1984    Years since quitting: 39.2   Smokeless tobacco: Never  Vaping Use   Vaping status: Never Used  Substance and Sexual Activity   Alcohol use: Yes    Alcohol/week: 0.0 standard drinks of alcohol    Comment: maybe 1 beer/month   Drug use: No   Sexual activity: Yes    Birth control/protection: None   I spent 36 minutes in the care of the patient today including face-to-face time, preparation to see the patient, as well as review of previous x-ray imaging, discussion on his likely diagnosis of rotator cuff tearing, general discussion on possible surgery that could be performed by one of my orthopedic colleagues, as well as expected recovery/rehab if this is needed for the above diagnoses.   Madelyn Brunner, DO Primary Care Sports Medicine Physician  [facility] [address] - Orthopedics  This note was dictated using Dragon naturally speaking software and may contain errors in syntax, spelling, or content which have not been identified prior to signing this note.

## 2023-07-28 NOTE — Progress Notes (Signed)
 Patient says that his shoulder is still very bothersome. He did not get much relief from his injection in December. He says that he has been in physical therapy for weeks and he is still having pain, although it is different now. He says that he was having pain and snapping over the top of the shoulder before, but now it is more so down the side of the arm in the deltoid. He says that in physical therapy, they will "snap" the top and then it improves for while. He goes to physical therapy once every 1-2 weeks, and does his exercises daily on his own.

## 2023-07-31 ENCOUNTER — Encounter: Payer: Self-pay | Admitting: Sports Medicine

## 2023-08-06 ENCOUNTER — Ambulatory Visit
Admission: RE | Admit: 2023-08-06 | Discharge: 2023-08-06 | Disposition: A | Discharge status: Home / Self Care | Source: Ambulatory Visit | Attending: Sports Medicine

## 2023-08-06 DIAGNOSIS — G8929 Other chronic pain: Secondary | ICD-10-CM

## 2023-08-06 DIAGNOSIS — M12811 Other specific arthropathies, not elsewhere classified, right shoulder: Secondary | ICD-10-CM

## 2023-08-14 ENCOUNTER — Ambulatory Visit: Admitting: Sports Medicine

## 2023-08-14 ENCOUNTER — Encounter: Payer: Self-pay | Admitting: Sports Medicine

## 2023-08-14 ENCOUNTER — Other Ambulatory Visit: Payer: Self-pay

## 2023-08-14 DIAGNOSIS — M7541 Impingement syndrome of right shoulder: Secondary | ICD-10-CM

## 2023-08-14 DIAGNOSIS — M19171 Post-traumatic osteoarthritis, right ankle and foot: Secondary | ICD-10-CM

## 2023-08-14 DIAGNOSIS — G8929 Other chronic pain: Secondary | ICD-10-CM

## 2023-08-14 DIAGNOSIS — M75101 Unspecified rotator cuff tear or rupture of right shoulder, not specified as traumatic: Secondary | ICD-10-CM

## 2023-08-14 DIAGNOSIS — M19071 Primary osteoarthritis, right ankle and foot: Secondary | ICD-10-CM

## 2023-08-14 DIAGNOSIS — M25511 Pain in right shoulder: Secondary | ICD-10-CM

## 2023-08-14 MED ORDER — METHYLPREDNISOLONE ACETATE 40 MG/ML IJ SUSP
40.0000 mg | INTRAMUSCULAR | Status: AC | PRN
Start: 2023-08-14 — End: 2023-08-14
  Administered 2023-08-14: 40 mg via INTRA_ARTICULAR

## 2023-08-14 MED ORDER — LIDOCAINE HCL 1 % IJ SOLN
1.0000 mL | INTRAMUSCULAR | Status: AC | PRN
Start: 2023-08-14 — End: 2023-08-14
  Administered 2023-08-14: 1 mL

## 2023-08-14 MED ORDER — BUPIVACAINE HCL 0.25 % IJ SOLN
2.0000 mL | INTRAMUSCULAR | Status: AC | PRN
Start: 2023-08-14 — End: 2023-08-14
  Administered 2023-08-14: 2 mL via INTRA_ARTICULAR

## 2023-08-14 NOTE — Progress Notes (Signed)
 Marc Mills - 78 y.o. male MRN [id number]  Date of birth: 09/24/1945  Office Visit Note: Visit Date: 08/14/2023 PCP: Andi Devon, MD Referred by: Andi Devon, MD  Subjective: Chief Complaint  Patient presents with   Right Shoulder - Follow-up   Right Ankle - Pain   HPI: Marc Mills is a pleasant 78 y.o. male who presents today for follow-up of right ankle arthritis, right shoulder pain with MRI review.  Right shoulder -the right shoulder is doing somewhat better, he has moderate 5 his physical activity and swimming, doing more of a doggy paddle with the frontward reach opposed to a full overhead stroke.  His MRI did show partial tearing.  Right ankle -he has significant known osteoarthritis of the ankle, likely posttraumatic with previous ORIF.  He has discussed possible ankle replacement with an outside orthopedic surgeon.  He has notable limitation in motion, but previous ankle injections last year did give him some relief.   Pertinent ROS were reviewed with the patient and found to be negative unless otherwise specified above in HPI.   Assessment & Plan: Visit Diagnoses:  1. Chronic right shoulder pain   2. Tear of right supraspinatus tendon   3. Impingement syndrome of right shoulder   4. Post-traumatic arthritis of ankle, right    Plan: Impression is chronic right shoulder pain with MRI confirmed high-grade partial tearing of the supraspinatus tendon as well as tendinosis and mild undersurface tearing of the spinatus with associated mild to moderate glenohumeral joint arthritis.  He has received some relief from prior injection, but his pain has been more manageable with activity modification, specifically with modifying his swimming stroke.  We discussed these activity modifications going forward and appropriate swimming strokes to lessen his pain but keep the mobility present.  Through shared decision-making, we did proceed with ultrasound-guided right  intra-articular ankle injection, patient tolerated well.  He does have notable advanced osteoarthritic change, as long as injections are giving him benefit, okay with the infrequent nature of these.  If he wishes to proceed with ankle replacement, he may follow-up with his known orthopedic ankle surgeon (wife had).  Discussed postinjection protocol.  May use Tylenol, ice/heat as needed.  Given his known diagnosis now with MRI confirmation, we will get him started on a home exercise regimen.  We did print out a customized handout for shoulder rehab and stabilization, my athletic trainer Isabelle Course did review these with him in the room today.  He will perform once daily.  We discussed ultrasound-guided insertional supraspinatus injection with either corticosteroid or PRP sometime in the future, he will think about these.  If surgical intervention were to be pursued, this would likely be rotator cuff repair and may include decompression given his downsloping acromion.  Follow-up: Return for f/u in 4-6 weeks for R-shoulder (30-mins for [place] inj poss).   Meds & Orders: No orders of the defined types were placed in this encounter.   Orders Placed This Encounter  Procedures   Medium Joint Inj   US Guided Needle Placement - No Linked Charges     Procedures: Medium Joint Inj: R ankle on 08/14/2023 2:34 PM Indications: pain Details: 22 G 1.5 in needle, ultrasound-guided anterior approach Medications: 1 mL lidocaine 1 %; 2 mL bupivacaine 0.25 %; 40 mg methylPREDNISolone acetate 40 MG/ML  *Procedurally successful right intra-articular ankle injection using an in-plane technique. Procedure, treatment alternatives, risks and benefits explained, specific risks discussed. Consent was given by the patient. Patient was prepped  and draped in the usual sterile fashion.          Clinical History: No specialty comments available.  He reports that he quit smoking about 39 years ago. His smoking use included  cigarettes. He started smoking about 59 years ago. He has a 40 pack-year smoking history. He has never used smokeless tobacco. No results for input(s): "HGBA1C", "LABURIC" in the last 8760 hours.  Objective:    Physical Exam  Gen: Well-appearing, in no acute distress; non-toxic CV: Well-perfused. Warm.  Resp: Breathing unlabored on room air; no wheezing. Psych: Fluid speech in conversation; appropriate affect; normal thought process  Ortho Exam - Right shoulder: + Pain at Codman's point.  There is no effusion noted.  There is good active range of motion but passive external rotation and drop arm positive.  - Right ankle: There is no redness swelling or effusion of the ankle joint.  There is quite significant limitation in plantarflexion and dorsiflexion about the ankle.  Imaging:  *I did perform independent review and interpretation of the right shoulder MRI.  There is high-grade partial thickness split tearing near the footprint of the supraspinatus tendon.  There is notable tendinopathy with small undersurface tearing of the infraspinatus musculotendinous junction.  There is mild to moderate cartilage loss with glenohumeral joint arthritis.  There is also a downsloping acromion with findings suggestive of shoulder impingement.  MR SHOULDER RIGHT WO CONTRAST CLINICAL DATA:  Chronic right shoulder pain. Rotator cuff disorder suspected.  EXAM: MRI OF THE RIGHT SHOULDER WITHOUT CONTRAST  TECHNIQUE: Multiplanar, multisequence MR imaging of the shoulder was performed. No intravenous contrast was administered.  COMPARISON:  Right shoulder radiographs 03/30/2023  FINDINGS: Rotator cuff: There are two longitudinal linear fluid bright regions indicating partial-thickness midsubstance tears within the majority of the AP dimension of the supraspinatus tendon footprint, in a region measuring up to 7 mm in transverse dimension (coronal series 107 images 7 through 10) and 15 mm in AP  dimension (sagittal series 110 image 4). Moderate underlying supraspinatus tendinosis. No tendon retraction. Moderate to high-grade mid and anterior infraspinatus intermediate T2 signal and thickening tendinosis. There is mild fluid at the deep aspect of the more proximal infraspinatus musculotendinous junction (sagittal image 7). There is also mild-to-moderate fluid centered anterior and deep to the infraspinatus musculotendinous junction and measuring up to 19 mm in AP dimension (sagittal images 9 through 14) and posterior deep to the infraspinatus musculotendinous junction measuring up to 17 mm in AP dimension (sagittal images 8 through 11), with numerous tiny decreased T1 and decreased T2 signal bodies within this fluid. This suggests infraspinatus muscle interstitial tears. The subscapularis and teres minor are intact.  Muscles: No rotator cuff muscle atrophy, fatty infiltration, or edema.  Biceps long head: The intra-articular long head of the biceps tendon is intact.  Acromioclavicular Joint: There are moderate degenerative changes of the acromioclavicular joint including joint space narrowing, subchondral marrow edema, and peripheral osteophytosis. Type III acromion, with mild downsloping of the anterolateral acromion. Trace fluid within the subacromial/subdeltoid bursa.  Glenohumeral Joint: Mild to moderate glenohumeral cartilage thinning. No glenohumeral joint effusion.  Labrum: Mild intermediate T2 signal degenerative change within the posteroinferior glenoid labrum (axial images 16 and 17).  Bones:  No acute fracture.  Other: None.  IMPRESSION: 1. Two longitudinal linear fluid bright regions indicating partial-thickness midsubstance tears within the majority of the AP dimension of the supraspinatus tendon footprint. Overall these tears measure up to 7 mm in transverse dimension and 15 mm in  AP dimension. Moderate underlying supraspinatus tendinosis. No  tendon retraction. 2. Moderate to high-grade mid and anterior infraspinatus tendinosis. There is mild-to-moderate fluid centered anterior and deep to the infraspinatus musculotendinous junction and posterior deep to the infraspinatus musculotendinous junction, with numerous tiny decreased T1 and decreased T2 signal bodies within this fluid. This suggests infraspinatus muscle interstitial tears. 3. Moderate degenerative changes of the acromioclavicular joint. Type III acromion, with mild downsloping of the anterolateral acromion. 4. Mild to moderate glenohumeral cartilage thinning.  Electronically Signed   By: Neita Garnet M.D.   On: 08/13/2023 15:10  *I did review both his right shoulder x-ray and right ankle x-rays during the visit today.  03/30/23: 3 views of the right shoulder including AP, Grashey, axial view were  ordered and reviewed by myself.  X-rays show mild AC joint arthritic  change.  The humeral head is well located with minimal glenohumeral joint  arthritis.  No acute fracture or otherwise bony abnormality noted.   10/30/22: Radiographs of his right ankle were obtained today.  He has chronic  deformity of the fibula.  He has hardware in place in the tibia and medial  malleolus.  Advanced degenerative changes of the tibial talar joint.   Progressed slightly from previous   Past Medical/Family/Surgical/Social History: Medications & Allergies reviewed per EMR, new medications updated. Patient Active Problem List   Diagnosis Date Noted   Trochanteric bursitis, left hip 06/19/2023   Arthritis of right ankle 02/04/2023   Post-traumatic osteoarthritis, right ankle and foot 02/18/2022   Toe pain, right 02/18/2022   Essential hypertension 08/28/2021   Low back pain 06/27/2021   Acute embolism and thrombosis of unspecified deep veins of unspecified lower extremity (HCC) 06/03/2021   Anal fistula 06/03/2021   Anxiety 06/03/2021   Erectile dysfunction 06/03/2021    Essential tremor 06/03/2021   History of lymphoma 06/03/2021   Impacted cerumen 06/03/2021   Peripheral neuropathy 06/03/2021   History of colonic polyps 06/03/2021   Rectal fistula 06/03/2021   Inflamed seborrheic keratosis 06/03/2021   Vitamin D deficiency 06/03/2021   Tremor 06/03/2021   Eustachian tube dysfunction, left 12/10/2020   Otalgia, left 12/10/2020   Nondisplaced fracture of distal phalanx of left middle finger, initial encounter for open fracture 09/18/2020   Pain in left hip 03/27/2020   Laceration of right ring finger 05/05/2019   Laceration of flexor muscle, fascia and tendon of right ring finger at wrist and hand level, initial encounter 04/23/2019   Unspecified fracture of the lower end of right radius, initial encounter for closed fracture 04/23/2019   Fracture of unspecified phalanx of right ring finger, initial encounter for open fracture 04/23/2019   Left knee pain 06/14/2018   Pain in left ankle and joints of left foot 12/18/2017   Bilateral edema of lower extremity 10/30/2015   Onychomycosis 10/30/2015   Sepsis (HCC) 08/31/2015   History of pulmonary embolism    History of DVT (deep vein thrombosis)    Pyrexia    Cellulitis 03/18/2015   Chronic anticoagulation 03/17/2015   Morbid obesity with BMI of 45.0-49.9, adult (HCC) 03/17/2015   S/P IVC filter 03/17/2015   Genetic testing 01/12/2015   Postoperative wound infection 12/09/2013   Postop check 11/24/2013   Ulcer of lower limb (HCC) 08/24/2013   Inguinal lymphadenopathy 08/16/2013   Encounter for therapeutic drug monitoring 06/09/2013   Left leg cellulitis 04/04/2013   Osteoarthritis of left knee 11/02/2012   Cellulitis of right lower extremity 04/30/2012   VTE in remission/  chronic coumadin 04/29/2012   S/P splenectomy 04/29/2012   Incisional hernia 05/27/2011   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   Morbid obesity (HCC) 04/19/2008   Obstructive sleep apnea 04/19/2008   VARICOSE  VEINS, LOWER EXTREMITIES 04/19/2008   PULMONARY EMBOLISM, HX OF 04/19/2008   Past Medical History:  Diagnosis Date   Anorectal fistula    Arthritis    Blood transfusion without reported diagnosis    Cancer (HCC)    lymphoma hx   Cataract    Cellulitis 04/04/2013   DVT (deep venous thrombosis) (HCC)    2011 right leg   History of lymphoma    History of pulmonary embolus (PE)    Incisional hernia    Morbid obesity (HCC)    OSA on CPAP    cpap  10 yrs   Varicose veins of lower extremities    pe   Family History  Problem Relation Age of Onset   Heart disease Father    Varicose Veins Father    Heart attack Father        dx. 57s   Heart disease Mother 57   Varicose Veins Mother    Pancreatic cancer Sister 55   Cancer Maternal Grandmother        either stomach or pancreatic primary    Pancreatic cancer Maternal Grandfather        dx. 50s   Lung cancer Maternal Aunt        secondhand smoke exposure   Heart attack Paternal Uncle    Rectal cancer Maternal Aunt        dx. 80s   Cancer Maternal Aunt        unknown type   Diabetes Paternal Uncle    Cancer Cousin        unknown type   Breast cancer Cousin        dx. 57s   Past Surgical History:  Procedure Laterality Date   EYE SURGERY Left 03   cat, detached ret     FRACTURE SURGERY     HERNIA REPAIR     INGUINAL LYMPH NODE BIOPSY [name] 11/15/2013   Procedure: RIGHT INGUINAL LYMPH NODE BIOPSY;  Surgeon: Cherylynn Ridges, MD;  Location: [facility] OR;  Service: General;  Laterality: [name];   ivc filter     PROSTATE SURGERY     (patient denies)   SPLENECTOMY     TONSILLECTOMY     age 49   TOTAL KNEE ARTHROPLASTY Left 11/02/2012   Procedure: TOTAL KNEE ARTHROPLASTY with revision tibia;  Surgeon: Valeria Batman, MD;  Location: [facility] OR;  Service: Orthopedics;  Laterality: Left;   Social History   Occupational History   Not on file  Tobacco Use   Smoking status: Former    Current packs/day: 0.00    Average packs/day: 2.0 packs/day  for 20.0 years (40.0 ttl pk-yrs)    Types: Cigarettes    Start date: 05/05/1964    Quit date: 05/05/1984    Years since quitting: 39.3   Smokeless tobacco: Never  Vaping Use   Vaping status: Never Used  Substance and Sexual Activity   Alcohol use: Yes    Alcohol/week: 0.0 standard drinks of alcohol    Comment: maybe 1 beer/month   Drug use: No   Sexual activity: Yes    Birth control/protection: None

## 2023-08-14 NOTE — Progress Notes (Signed)
 Patient says that his shoulder has not been bothering him too much. He has been able to swim, but has modified how he does so.   Patient says that his ankle pain has never gone away completely. He does find that he gets some relief from the injections and would like to proceed with one today.  Patient was instructed in 10 minutes of therapeutic exercises for right shoulder to improve strength, ROM and function according to my instructions and plan of care by a Certified Athletic Trainer during the office visit. A customized handout was provided and demonstration of proper technique shown and discussed. Patient did perform exercises and demonstrate understanding through teachback.  All questions discussed and answered.

## 2023-09-01 ENCOUNTER — Emergency Department (HOSPITAL_BASED_OUTPATIENT_CLINIC_OR_DEPARTMENT_OTHER)

## 2023-09-01 ENCOUNTER — Emergency Department (HOSPITAL_BASED_OUTPATIENT_CLINIC_OR_DEPARTMENT_OTHER)
Admission: EM | Admit: 2023-09-01 | Discharge: 2023-09-01 | Disposition: A | Discharge status: Home / Self Care | Attending: Emergency Medicine | Admitting: Emergency Medicine

## 2023-09-01 ENCOUNTER — Other Ambulatory Visit: Payer: Self-pay

## 2023-09-01 ENCOUNTER — Encounter (HOSPITAL_BASED_OUTPATIENT_CLINIC_OR_DEPARTMENT_OTHER): Payer: Self-pay | Admitting: Emergency Medicine

## 2023-09-01 DIAGNOSIS — M7989 Other specified soft tissue disorders: Secondary | ICD-10-CM | POA: Diagnosis not present

## 2023-09-01 DIAGNOSIS — Z86711 Personal history of pulmonary embolism: Secondary | ICD-10-CM | POA: Diagnosis not present

## 2023-09-01 DIAGNOSIS — S8001XA Contusion of right knee, initial encounter: Secondary | ICD-10-CM | POA: Diagnosis not present

## 2023-09-01 DIAGNOSIS — D689 Coagulation defect, unspecified: Secondary | ICD-10-CM | POA: Insufficient documentation

## 2023-09-01 DIAGNOSIS — S8991XA Unspecified injury of right lower leg, initial encounter: Secondary | ICD-10-CM | POA: Diagnosis present

## 2023-09-01 DIAGNOSIS — W19XXXA Unspecified fall, initial encounter: Secondary | ICD-10-CM | POA: Diagnosis not present

## 2023-09-01 DIAGNOSIS — Z7901 Long term (current) use of anticoagulants: Secondary | ICD-10-CM | POA: Diagnosis not present

## 2023-09-01 MED ORDER — DIPHENHYDRAMINE HCL 25 MG PO CAPS
25.0000 mg | ORAL_CAPSULE | Freq: Once | ORAL | Status: AC
  Administered 2023-09-01: 25 mg via ORAL
  Filled 2023-09-01: qty 1

## 2023-09-01 MED ORDER — OXYCODONE-ACETAMINOPHEN 5-325 MG PO TABS: 1.0000 | ORAL_TABLET | Freq: Four times a day (QID) | ORAL | 0 refills | Status: DC | PRN

## 2023-09-01 MED ORDER — OXYCODONE-ACETAMINOPHEN 5-325 MG PO TABS
2.0000 | ORAL_TABLET | Freq: Once | ORAL | Status: AC
  Administered 2023-09-01: 2 via ORAL
  Filled 2023-09-01: qty 2

## 2023-09-01 NOTE — ED Triage Notes (Signed)
 Pt POV after mechanical fall around 2pm, tripped on broomstick, fell onto hardwood floor, reports R knee pain, not improved after ice and tylenol . +thinners, denies hitting head, no LOC.

## 2023-09-01 NOTE — ED Notes (Signed)
ACE bandage applied to knee.

## 2023-09-01 NOTE — ED Provider Notes (Signed)
 [facility] EMERGENCY DEPARTMENT AT [facility] ([place]) Provider Note   CSN: [id number] Arrival date & time: 09/01/23  1653     History  Chief Complaint  Patient presents with   Fall   Knee Injury    Marc Mills is a 78 y.o. male.  Patient to ED with right knee pain after mechanical fall earlier today, full impact to the right knee. No other injury. No hip pain. Anticoagulated with Xarelto  for PE. He tried ice and elevation with Tylenol  but reports the pain became too great prompting ED visit.   The history is provided by the patient and the spouse. No language interpreter was used.  Fall       Home Medications Prior to Admission medications   Medication Sig Start Date End Date Taking? Authorizing Provider  oxyCODONE -acetaminophen  (PERCOCET/ROXICET) 5-325 MG tablet Take 1 tablet by mouth every 6 (six) hours as needed for severe pain (pain score 7-10). 09/01/23  Yes Silas Sedam, PA-C  acetaminophen  (TYLENOL ) 500 MG tablet Take 1,000 mg by mouth daily.    [provider]  Acetylcysteine (NAC) 500 MG CAPS Take 500 mg by mouth daily.    [provider]  Ascorbic Acid (VITAMIN C) 500 MG CAPS Take 500 mg by mouth daily.    [provider]  B Complex Vitamins (VITAMIN B COMPLEX) TABS Take 1 tablet by mouth daily.    [provider]  Kelby Patches COVID-19 AG HOME TEST KIT TEST AS DIRECTED TODAY 01/19/21   [provider]  cephALEXin  (KEFLEX ) 500 MG capsule Take 500 mg by mouth 3 (three) times daily. continuos    [provider]  cetirizine (ZYRTEC) 5 MG tablet Take 1 tablet by mouth daily as needed for allergies. 11/24/20 11/24/21  [provider]  Cholecalciferol  (VITAMIN D3 ULTRA STRENGTH) 125 MCG (5000 UT) capsule Take 5,000 Units by mouth daily.    [provider]  Coenzyme Q10 (COQ-10) 30 MG CAPS Take 1 capsule by mouth daily.    [provider]  diclofenac Sodium (VOLTAREN) 1 % GEL 2 g daily as  needed (pain). 05/22/21   [provider]  famotidine  (PEPCID ) 20 MG tablet Take 20 mg by mouth every evening. 08/08/21   [provider]  Ginger, Zingiber officinalis, (GINGER EXTRACT) 250 MG CAPS Take 1 capsule by mouth daily.    [provider]  Multiple Vitamin (MULTIVITAMIN ADULT) TABS Take 1 tablet by mouth daily.    [provider]  Multiple Vitamins-Minerals (ONCOVITE) TABS Take 1 tablet by mouth daily.    [provider]  Nutritional Supplements (SALMON OIL) CAPS Take 1 capsule by mouth daily.    [provider]  Quercetin 50 MG TABS Take 50 mg by mouth daily.    [provider]  rivaroxaban  (XARELTO ) 20 MG TABS tablet Take 20 mg by mouth daily. 08/06/20   [provider]  sildenafil  (REVATIO ) 20 MG tablet Take 20 mg by mouth daily as needed (erectile dysfunction).    [provider]  vitamin E 180 MG (400 UNITS) capsule Take 400 Units by mouth daily.    [provider]  Zinc 30 MG TABS Take 30 mg by mouth daily.    [provider]      Allergies    Hydrocodone, Hydrocodone-guaifenesin , Hydromorphone  hcl, and Oxycodone     Review of Systems   Review of Systems  Physical Exam Updated Vital Signs BP (!) 169/83   Pulse 78   Temp 98.4 F (36.9  C) (Oral)   Resp 19   Ht 5\' 11"  (1.803 m)   Wt (!) 141.5 kg   SpO2 95%   BMI 43.52 kg/m  Physical Exam Vitals and nursing note reviewed.  Constitutional:      Appearance: Normal appearance. He is obese.  HENT:     Head: Atraumatic.  Pulmonary:     Effort: Pulmonary effort is normal.  Musculoskeletal:     Comments: Bilateral LE swelling and color changes of venous stasis. Right knee is moderately swollen anteriorly with minimal discoloration. No bony deformity. Pain with ROM. Hip, thigh, calf and ankle without tenderness.   Skin:    General: Skin is warm and dry.  Neurological:     Mental Status: He is alert.     Sensory: No sensory  deficit.     ED Results / Procedures / Treatments   Labs (all labs ordered are listed, but only abnormal results are displayed) Labs Reviewed - No data to display  EKG None  Radiology DG Knee Complete 4 Views Right Result Date: 09/01/2023 CLINICAL DATA:  Knee injury due to a mechanical fall around 2 p.m. Trip and fall injury. Right knee pain. Not improved with conservative therapy. Positive thinners. Mostly pain on the medial side. EXAM: RIGHT KNEE - COMPLETE 4+ VIEW COMPARISON:  06/06/2022 FINDINGS: Mild degenerative changes with mild medial compartment narrowing and small osteophyte formation in the right knee. Small patellar osteophytes. No evidence of acute fracture or dislocation. No focal bone lesion or bone destruction. Soft tissue swelling over the prepatellar fat. No significant effusion. Postoperative changes demonstrated in the distal femoral shaft, incompletely visualized. Soft tissue calcification in the popliteal fossa is unchanged since prior study, possibly dystrophic or vascular. IMPRESSION: No acute displaced fractures are identified. Mild degenerative changes. Prepatellar soft tissue swelling. Electronically Signed   By: Boyce Byes M.D.   On: 09/01/2023 17:37    Procedures Procedures    Medications Ordered in ED Medications  oxyCODONE -acetaminophen  (PERCOCET/ROXICET) 5-325 MG per tablet 2 tablet (2 tablets Oral Given 09/01/23 1829)  diphenhydrAMINE (BENADRYL) capsule 25 mg (25 mg Oral Given 09/01/23 1829)    ED Course/ Medical Decision Making/ A&P Clinical Course as of 09/01/23 1851  Tue Sep 01, 2023  1751 Mechanical fall earlier today onto right knee. Xray here is negative for fracture. Knee is significantly swollen - will apply ACE for mild compression, Ice. Percocet for pain. Allergy list states "itching" with Percocet but patient would like to take this for pain. Will give with Benadryl. Monitor for now for pain relief and to observe ambulation.  [SU]  1846  Patient comfortable with discharge home. Oxycodone  Rx provided. Discussed "allergy", potential for worsening. Patient and spouse comfortable with prescription. He has a cane to support walking. PCP follow up for recheck if no better in one week.  [SU]    Clinical Course User Index [SU] Mandy Second, PA-C                                 Medical Decision Making Amount and/or Complexity of Data Reviewed Radiology: ordered.  Risk Prescription drug management.           Final Clinical Impression(s) / ED Diagnoses Final diagnoses:  Contusion of right knee, initial encounter  Coagulopathy (HCC)    Rx / DC Orders ED Discharge Orders          Ordered    oxyCODONE -acetaminophen  (PERCOCET/ROXICET) 5-325  MG tablet  Every 6 hours PRN        09/01/23 1849              Mandy Second, PA-C 09/01/23 1851    Tegeler, Marine Sia, MD 09/01/23 2340

## 2023-09-01 NOTE — Discharge Instructions (Addendum)
 As we discussed, ambulate as little as possible to rest the knee. Ice and elevate to reduce swelling. Take Percocet for pain as prescribed and Benadryl if any itching. Stop taking this medication if there is any significant rash, shortness of breath, lip or tongue swelling or throat fullness - and seek medical attention if these occur.

## 2023-09-06 ENCOUNTER — Encounter: Payer: Self-pay | Admitting: Sports Medicine

## 2023-09-08 ENCOUNTER — Other Ambulatory Visit: Payer: Self-pay

## 2023-09-08 ENCOUNTER — Encounter: Payer: Self-pay | Admitting: Sports Medicine

## 2023-09-08 ENCOUNTER — Ambulatory Visit (INDEPENDENT_AMBULATORY_CARE_PROVIDER_SITE_OTHER): Admitting: Sports Medicine

## 2023-09-08 DIAGNOSIS — Z7901 Long term (current) use of anticoagulants: Secondary | ICD-10-CM | POA: Diagnosis not present

## 2023-09-08 DIAGNOSIS — T148XXA Other injury of unspecified body region, initial encounter: Secondary | ICD-10-CM | POA: Diagnosis not present

## 2023-09-08 DIAGNOSIS — M25461 Effusion, right knee: Secondary | ICD-10-CM | POA: Diagnosis not present

## 2023-09-08 DIAGNOSIS — W19XXXA Unspecified fall, initial encounter: Secondary | ICD-10-CM

## 2023-09-08 DIAGNOSIS — M25561 Pain in right knee: Secondary | ICD-10-CM

## 2023-09-08 DIAGNOSIS — L03119 Cellulitis of unspecified part of limb: Secondary | ICD-10-CM

## 2023-09-08 NOTE — Progress Notes (Signed)
 Marc Mills - 78 y.o. male MRN [id number]  Date of birth: 27-Jun-1945  Office Visit Note: Visit Date: 09/08/2023 PCP: Yolanda Hence, MD Referred by: Yolanda Hence, MD  Subjective: Chief Complaint  Patient presents with   Right Knee - Pain   HPI: Marc Mills is a pleasant 78 y.o. male who presents today for right knee/leg pain after direct fall.  Marc Mills suffered a mechanical fall without direct impact onto the right knee on 09/01/2023.  Note reviewed by myself today from [facility] ED on 09/01/2023.  He was evaluated in the emergency room and had x-rays with no acute fracture or bony abnormality but soft tissue swelling. He is on Xarelto  20 mg daily for history of PE.  He was given a short course of oxycodone  but he did not notice much of a difference compared to regular Tylenol  for analgesic control.  He has been icing the knee for 20 minutes 4-5 times daily as well as wearing compression stockings.  His swelling has improved since last week but still having difficulty with pain and weightbearing.    He does have a chronic history of recurrent cellulitis of the lower extremities.  He does follow with an infectious disease specialist and is managed on cephalexin  500 mg twice daily chronically.  He does note some warmth to the leg given his swelling, but does not feel this is cellulitic in nature.  Pertinent ROS were reviewed with the patient and found to be negative unless otherwise specified above in HPI.   Assessment & Plan: Visit Diagnoses:  1. Pain and swelling of right knee   2. Fall, initial encounter   3. Hematoma   4. Chronic anticoagulation   5. Recurrent cellulitis of lower extremity    Plan: Impression is right leg pain, swelling and ecchymosis after mechanical fall onto the right knee on 09/01/2023.  I did review his x-rays from the ED which showed no acute fracture.  He does have quite a bit of swelling and ecchymosis within the leg, we did perform  an ultrasound today which showed no joint effusion but did show a fairly large hematoma from the distal thigh that extends down inside the medial aspect of the knee and upper calf.  Discussed supportive care and avoiding aspiration today given his hematoma and concomitant Xarelto  use this would simply reaccumulate if aspirated.  I would like him to continue his leg compression stockings, elevation of the leg as well as icing multiple times daily.  I did discuss return precautions for cellulitis, albeit rare from hematoma formation.  He will continue his cephalexin  50 mg twice daily for prevention of his recurrent cellulitis at the discretion of his infectious disease physician.  Discussed with Marc Mills that his pain will likely persist for the first 2-3 weeks but then should improve, although it would likely take a few months for his hematoma to reabsorb.  He may continue his aquatic/swimming activities as able.  He was given a few tablets of Percocet from the ED, although he does not find these as helpful.  May discontinue this and use Tylenol  as needed. F/u PRN.  Follow-up: Return if symptoms worsen or fail to improve.   Meds & Orders: No orders of the defined types were placed in this encounter.   Orders Placed This Encounter  Procedures   US  Extrem Low Right Ltd     Procedures: No procedures performed      Clinical History: No specialty comments available.  He reports  that he quit smoking about 39 years ago. His smoking use included cigarettes. He started smoking about 59 years ago. He has a 40 pack-year smoking history. He has never used smokeless tobacco. No results for input(s): "HGBA1C", "LABURIC" in the last 8760 hours.  Objective:   Physical Exam  Gen: Well-appearing, in no acute distress; non-toxic CV: Well-perfused. Warm.  Resp: Breathing unlabored on room air; no wheezing. Psych: Fluid speech in conversation; appropriate affect; normal thought process  Ortho Exam - Right leg:  There is rather extensive ecchymosis and soft tissue swelling which starts at the distal femur and extends down to the proximal medial calf.  There is also chronic lymphedema noted.  No palpable effusion within the knee joint although range of motion is somewhat painful and there is tenderness to palpation in the soft tissue of the location of his swelling.  Imaging:  US  Extrem Low Right Ltd Limited musculoskeletal ultrasound of the right leg and right knee was  performed today.  X-rays demonstrate a large hypoechoic and mixed  echogenic structure within the soft tissue which reflects likely hematoma  which is present from the distal medial thigh and extends to the proximal  medial calf.  The knee joint was evaluated without patellar irregularity.   There is no significant joint effusion within the knee itself.  The  neurovasculature of the popliteal posterior fossa was identified with  compressible venous structures, without signs of clotting.  There is soft  tissue cobblestoning which extends down into the calf with venous  stasis/lymphedema changes present.  *Independent review and interpretation of right knee x-ray from 09/01/2023 was performed by myself today.  4 views show no acute fracture or notable joint effusion.  There is likely soft tissue swelling over the medial aspect of the leg.  DG Knee Complete 4 Views Right CLINICAL DATA:  Knee injury due to a mechanical fall around 2 p.m. Trip and fall injury. Right knee pain. Not improved with conservative therapy. Positive thinners. Mostly pain on the medial side.  EXAM: RIGHT KNEE - COMPLETE 4+ VIEW  COMPARISON:  06/06/2022  FINDINGS: Mild degenerative changes with mild medial compartment narrowing and small osteophyte formation in the right knee. Small patellar osteophytes. No evidence of acute fracture or dislocation. No focal bone lesion or bone destruction. Soft tissue swelling over the prepatellar fat. No significant  effusion. Postoperative changes demonstrated in the distal femoral shaft, incompletely visualized. Soft tissue calcification in the popliteal fossa is unchanged since prior study, possibly dystrophic or vascular.  IMPRESSION: No acute displaced fractures are identified. Mild degenerative changes. Prepatellar soft tissue swelling.  Electronically Signed   By: Boyce Byes M.D.   On: 09/01/2023 17:37  Past Medical/Family/Surgical/Social History: Medications & Allergies reviewed per EMR, new medications updated. Patient Active Problem List   Diagnosis Date Noted   Trochanteric bursitis, left hip 06/19/2023   Arthritis of right ankle 02/04/2023   Post-traumatic osteoarthritis, right ankle and foot 02/18/2022   Toe pain, right 02/18/2022   Essential hypertension 08/28/2021   Low back pain 06/27/2021   Acute embolism and thrombosis of unspecified deep veins of unspecified lower extremity (HCC) 06/03/2021   Anal fistula 06/03/2021   Anxiety 06/03/2021   Erectile dysfunction 06/03/2021   Essential tremor 06/03/2021   History of lymphoma 06/03/2021   Impacted cerumen 06/03/2021   Peripheral neuropathy 06/03/2021   History of colonic polyps 06/03/2021   Rectal fistula 06/03/2021   Inflamed seborrheic keratosis 06/03/2021   Vitamin D  deficiency 06/03/2021  Tremor 06/03/2021   Eustachian tube dysfunction, left 12/10/2020   Otalgia, left 12/10/2020   Nondisplaced fracture of distal phalanx of left middle finger, initial encounter for open fracture 09/18/2020   Pain in left hip 03/27/2020   Laceration of right ring finger 05/05/2019   Laceration of flexor muscle, fascia and tendon of right ring finger at wrist and hand level, initial encounter 04/23/2019   Unspecified fracture of the lower end of right radius, initial encounter for closed fracture 04/23/2019   Fracture of unspecified phalanx of right ring finger, initial encounter for open fracture 04/23/2019   Left knee pain  06/14/2018   Pain in left ankle and joints of left foot 12/18/2017   Bilateral edema of lower extremity 10/30/2015   Onychomycosis 10/30/2015   Sepsis (HCC) 08/31/2015   History of pulmonary embolism    History of DVT (deep vein thrombosis)    Pyrexia    Cellulitis 03/18/2015   Chronic anticoagulation 03/17/2015   Morbid obesity with BMI of 45.0-49.9, adult (HCC) 03/17/2015   S/P IVC filter 03/17/2015   Genetic testing 01/12/2015   Postoperative wound infection 12/09/2013   Postop check 11/24/2013   Ulcer of lower limb (HCC) 08/24/2013   Inguinal lymphadenopathy 08/16/2013   Encounter for therapeutic drug monitoring 06/09/2013   Left leg cellulitis 04/04/2013   Osteoarthritis of left knee 11/02/2012   Cellulitis of right lower extremity 04/30/2012   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  04/29/2012   S/P splenectomy 04/29/2012   Incisional hernia 05/27/2011   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   Morbid obesity (HCC) 04/19/2008   Obstructive sleep apnea 04/19/2008   VARICOSE VEINS, LOWER EXTREMITIES 04/19/2008   PULMONARY EMBOLISM, HX OF 04/19/2008   Past Medical History:  Diagnosis Date   Anorectal fistula    Arthritis    Blood transfusion without reported diagnosis    Cancer (HCC)    lymphoma hx   Cataract    Cellulitis 04/04/2013   DVT (deep venous thrombosis) (HCC)    2011 right leg   History of lymphoma    History of pulmonary embolus (PE)    Incisional hernia    Morbid obesity (HCC)    OSA on CPAP    cpap  10 yrs   Varicose veins of lower extremities    pe   Family History  Problem Relation Age of Onset   Heart disease Father    Varicose Veins Father    Heart attack Father        dx. 68s   Heart disease Mother 58   Varicose Veins Mother    Pancreatic cancer Sister 23   Cancer Maternal Grandmother        either stomach or pancreatic primary    Pancreatic cancer Maternal Grandfather        dx. 50s   Lung cancer Maternal Aunt         secondhand smoke exposure   Heart attack Paternal Uncle    Rectal cancer Maternal Aunt        dx. 80s   Cancer Maternal Aunt        unknown type   Diabetes Paternal Uncle    Cancer Cousin        unknown type   Breast cancer Cousin        dx. 49s   Past Surgical History:  Procedure Laterality Date   EYE SURGERY Left 03   cat, detached ret     FRACTURE SURGERY     HERNIA  REPAIR     INGUINAL LYMPH NODE BIOPSY [name] 11/15/2013   Procedure: RIGHT INGUINAL LYMPH NODE BIOPSY;  Surgeon: Diantha Fossa, MD;  Location: [facility] OR;  Service: General;  Laterality: [name];   ivc filter     PROSTATE SURGERY     (patient denies)   SPLENECTOMY     TONSILLECTOMY     age 21   TOTAL KNEE ARTHROPLASTY Left 11/02/2012   Procedure: TOTAL KNEE ARTHROPLASTY with revision tibia;  Surgeon: Shirlee Dotter, MD;  Location: [facility] OR;  Service: Orthopedics;  Laterality: Left;   Social History   Occupational History   Not on file  Tobacco Use   Smoking status: Former    Current packs/day: 0.00    Average packs/day: 2.0 packs/day for 20.0 years (40.0 ttl pk-yrs)    Types: Cigarettes    Start date: 05/05/1964    Quit date: 05/05/1984    Years since quitting: 39.3   Smokeless tobacco: Never  Vaping Use   Vaping status: Never Used  Substance and Sexual Activity   Alcohol use: Yes    Alcohol/week: 0.0 standard drinks of alcohol    Comment: maybe 1 beer/month   Drug use: No   Sexual activity: Yes    Birth control/protection: None

## 2023-09-08 NOTE — Progress Notes (Signed)
 Patient says that he fell directly on his knee 1 week ago. He has had pain, mostly in the knee, and swelling/bruising in the left leg. He did go to the ED where they took x-rays and prescribed Oxycodone  which he took for 4 days. He has been taking Tylenol  since he stopped taking Oxycodone  and it does give him some relief. He has been icing his knee for 20 minutes 4-5 times a day, and he wraps and elevates the leg. He says that his swelling is down by about half since last week. He is able to bear weight although it is painful. He says that his pain is consistent throughout the day.

## 2023-09-18 ENCOUNTER — Other Ambulatory Visit: Payer: Self-pay | Admitting: Sports Medicine

## 2023-09-18 DIAGNOSIS — M7989 Other specified soft tissue disorders: Secondary | ICD-10-CM

## 2023-09-18 DIAGNOSIS — Z86718 Personal history of other venous thrombosis and embolism: Secondary | ICD-10-CM

## 2023-09-21 ENCOUNTER — Ambulatory Visit (HOSPITAL_COMMUNITY)
Admission: RE | Admit: 2023-09-21 | Discharge: 2023-09-21 | Disposition: A | Discharge status: Home / Self Care | Source: Ambulatory Visit | Attending: Sports Medicine | Admitting: Sports Medicine

## 2023-09-21 DIAGNOSIS — Z86718 Personal history of other venous thrombosis and embolism: Secondary | ICD-10-CM | POA: Insufficient documentation

## 2023-09-21 DIAGNOSIS — M7989 Other specified soft tissue disorders: Secondary | ICD-10-CM | POA: Insufficient documentation

## 2023-09-21 DIAGNOSIS — M79661 Pain in right lower leg: Secondary | ICD-10-CM | POA: Insufficient documentation

## 2023-09-22 ENCOUNTER — Encounter: Payer: Self-pay | Admitting: Sports Medicine

## 2023-09-22 ENCOUNTER — Ambulatory Visit (INDEPENDENT_AMBULATORY_CARE_PROVIDER_SITE_OTHER): Admitting: Sports Medicine

## 2023-09-22 ENCOUNTER — Other Ambulatory Visit: Payer: Self-pay

## 2023-09-22 DIAGNOSIS — M75101 Unspecified rotator cuff tear or rupture of right shoulder, not specified as traumatic: Secondary | ICD-10-CM

## 2023-09-22 DIAGNOSIS — L03115 Cellulitis of right lower limb: Secondary | ICD-10-CM

## 2023-09-22 DIAGNOSIS — G8929 Other chronic pain: Secondary | ICD-10-CM | POA: Diagnosis not present

## 2023-09-22 DIAGNOSIS — M79661 Pain in right lower leg: Secondary | ICD-10-CM | POA: Diagnosis not present

## 2023-09-22 DIAGNOSIS — M25511 Pain in right shoulder: Secondary | ICD-10-CM | POA: Diagnosis not present

## 2023-09-22 DIAGNOSIS — M7989 Other specified soft tissue disorders: Secondary | ICD-10-CM

## 2023-09-22 DIAGNOSIS — T148XXA Other injury of unspecified body region, initial encounter: Secondary | ICD-10-CM

## 2023-09-22 MED ORDER — LIDOCAINE HCL 1 % IJ SOLN
2.0000 mL | INTRAMUSCULAR | Status: AC | PRN
Start: 2023-09-22 — End: 2023-09-22
  Administered 2023-09-22: 2 mL

## 2023-09-22 MED ORDER — BUPIVACAINE HCL 0.25 % IJ SOLN
2.0000 mL | INTRAMUSCULAR | Status: AC | PRN
Start: 2023-09-22 — End: 2023-09-22
  Administered 2023-09-22: 2 mL via INTRA_ARTICULAR

## 2023-09-22 MED ORDER — METHYLPREDNISOLONE ACETATE 40 MG/ML IJ SUSP
40.0000 mg | INTRAMUSCULAR | Status: AC | PRN
Start: 2023-09-22 — End: 2023-09-22
  Administered 2023-09-22: 40 mg via INTRA_ARTICULAR

## 2023-09-22 NOTE — Progress Notes (Addendum)
 Office Visit Note   Patient: Marc Mills           Date of Birth: [birth year]           MRN: [id number] Visit Date: 09/22/2023              Requested by: Yolanda Hence, MD [address] [place],  [place] [address] PCP: Yolanda Hence, MD  Medical Resident, Sports Medicine Fellow - Attending Physician Addendum:   I have independently interviewed and examined the patient myself. I have discussed the above with the original author and agree with their documentation. My edits for correction/addition/clarification have been made, see any changes above and below.   In summary, very pleasant 78 year old male who is presenting for acute on chronic right shoulder pain as well as right leg pain and swelling with hematoma formation from a mechanical fall.  Given his history of DVT, on Xarelto , I did order a lower extremity duplex ultrasound which she had yesterday which showed no evidence of DVT.  He does have resolving hematoma with soft tissue swelling in the leg which will continue to improve over the next few weeks to months.  Recommended he continue his compression sleeve for the leg, moist heat.  His right shoulder is from an insertional high-grade supraspinatus tear as well as some partial tearing of the concomitant infraspinatus.  He has progressed through therapy and does consistently do his home exercise regimen for the shoulder and rotator cuff, he will continue that.  Through shared decision-making, we did proceed with ultrasound-guided subacromial injection to his overlying supraspinatus tendon.  Advised on postinjection protocol.  He may continue swimming but will modify his stroke.  I will see him back in about 1 month at that point if he is doing well we may slowly start introducing some freestyle and other swimming related activity depending on his pain and function.  He will continue his Xarelto  20 mg daily.  He also has been progressed from chronic cephalexin  to a  higher grade antibiotic for his cellulitis which is managed by infectious disease, he will complete this course per ID recommendation. F/u 1 month.  Shauna Del, DO Primary Care Sports Medicine Physician  [facility] - Orthopedics  Assessment & Plan: Visit Diagnoses:  1. Chronic right shoulder pain   2. Tear of right supraspinatus tendon   3. Pain and swelling of right lower leg   4. Hematoma   5. Cellulitis of right lower extremity    Plan: Patient presenting with improving right leg hematoma.  Ultrasound of the lower extremities was negative for any DVT.  Patient notes improvement of his pain, advised patient that this will continue to improve and to continue with compression socks.  Patient's shoulder pain is likely related to subacromial impingement.  Patient like to proceed with subacromial injection of the shoulder.  Patient tolerated procedure well without any difficulty.  Did advise patient take it easy for the next 2 days and to evaluate his progress over the next few weeks.  Would like patient to continue doing his rotator cuff strengthening exercises.  Patient follow-up in 1 month and we will reevaluate progress at that time.  Follow-Up Instructions: Return in about 1 month (around 10/23/2023) for R-shoulder .   Orders:  Orders Placed This Encounter  Procedures   Large Joint Inj: R subacromial bursa   US  Guided Needle Placement - No Linked Charges   Meds ordered this encounter  Medications   bupivacaine  (MARCAINE )  0.25 % (with pres) injection 2 mL   lidocaine  (XYLOCAINE ) 1 % (with pres) injection 2 mL   methylPREDNISolone  acetate (DEPO-MEDROL ) injection 40 mg    Procedures: Large Joint Inj: R subacromial bursa on 09/22/2023 10:46 AM Details: 22 G 3.5 in needle, ultrasound-guided Medications: 2 mL lidocaine  1 %; 2 mL bupivacaine  0.25 %; 40 mg methylPREDNISolone  acetate 40 MG/ML  US -guided subacromial joint injection, right shoulder After discussion on  risks/benefits/indications, informed verbal consent was obtained. A timeout was then performed. The patient was positioned lying lateral recumbent on examination table. The patient's shoulder was prepped with chloraprep and multiple alcohol swabs and utilizing ultrasound guidance, the patient's subacromial bursa joint was identified on ultrasound. Using ultrasound guidance a 22-gauge, 1.5 inch needle with a mixture of 2:2:1 cc's lidocaine :bupivicaine:depomedrol was directed from a lateral to medial direction via in-plane technique into the glenohumeral joint with visualization of appropriate spread of injectate into the joint. Patient tolerated the procedure well without immediate complications.   Injection performed with Dr. Francina Irish   Consent was given by the patient.       Clinical Data: No additional findings.   Subjective: Chief Complaint  Patient presents with   Right Shoulder - Pain    Patient is here for follow-up of his right shoulder as well as his leg.  Patient notes that his right shoulder continue to bother him and he still has some pain with larger range of motion's.  Patient is continuously swimming but states that he is using small strokes instead of larger ones because the larger ones will end up exacerbating his pain. Patient notes that his leg swelling on the right side is improved significantly.  Patient notes that over the past 2 weeks was having slow recovery but today notes that this is the first day where he does not feel too much pain.  Patient did end up having cellulitis and spoke to his infectious disease doctor who did prescribe medication for him.    Review of Systems  Objective:  Physical Exam  Ortho Exam Inspection reveals no gross abnormalities of the right shoulder.  There are some mild tenderness to palpation over the humeral head.  Range of motion is full although there is noted pain with abduction after 90 degrees.  Noted pain with internal rotation, no  pain with external rotation passively but noted pain with resistance.  Empty can positive, Neer's positive  Inspection of the right lower extremity shows resolving hematoma, noted ecchymosis still present as well as some edema.  Edema does appear to be more distal traveling near the ankle.  Pitting noted.  No specialty comments available.  Imaging: VAS US  LOWER EXTREMITY VENOUS (DVT) Result Date: 09/22/2023  Lower Venous DVT Study Patient Name:  TU BAYLE  Date of Exam:   09/21/2023 Medical Rec #: [id number]           Accession #:    [id number] Date of Birth: 1946/03/13           Patient Gender: M Patient Age:   31 years Exam Location:  [address] Procedure:      VAS US  LOWER EXTREMITY VENOUS (DVT) Referring Phys: Bernabe Brew BROOKS --------------------------------------------------------------------------------  Indications: Swelling, Edema, and Pain.  Risk Factors: DVT Hx Trauma Recent fall obesity. Limitations: Body habitus. Comparison Study: 05/18/15 Performing Technologist: Estanislao Heimlich  Examination Guidelines: A complete evaluation includes B-mode imaging, spectral Doppler, color Doppler, and power Doppler as needed of all accessible portions of each vessel. Bilateral testing is considered  an integral part of a complete examination. Limited examinations for reoccurring indications may be performed as noted. The reflux portion of the exam is performed with the patient in reverse Trendelenburg.  +---------+---------------+---------+-----------+----------+-------------------+ RIGHT    CompressibilityPhasicitySpontaneityPropertiesThrombus Aging      +---------+---------------+---------+-----------+----------+-------------------+ CFV      Full           Yes      Yes                                      +---------+---------------+---------+-----------+----------+-------------------+ SFJ      Full                                                              +---------+---------------+---------+-----------+----------+-------------------+ FV Prox  Full                                                             +---------+---------------+---------+-----------+----------+-------------------+ FV Mid   Full                                                             +---------+---------------+---------+-----------+----------+-------------------+ FV DistalFull                    Yes                  Not well visualized +---------+---------------+---------+-----------+----------+-------------------+ PFV      Full                                                             +---------+---------------+---------+-----------+----------+-------------------+ POP      Full           Yes      Yes                                      +---------+---------------+---------+-----------+----------+-------------------+ PTV      Full                    Yes                  Not well visualized +---------+---------------+---------+-----------+----------+-------------------+ PERO                                                  Not visualized      +---------+---------------+---------+-----------+----------+-------------------+   +----+---------------+---------+-----------+----------+--------------+ LEFTCompressibilityPhasicitySpontaneityPropertiesThrombus Aging +----+---------------+---------+-----------+----------+--------------+ CFV Full  Yes      Yes                                 +----+---------------+---------+-----------+----------+--------------+     Summary: RIGHT: - There is no evidence of deep vein thrombosis in the lower extremity.  - No cystic structure found in the popliteal fossa.  LEFT: - No evidence of common femoral vein obstruction.   *See table(s) above for measurements and observations. Electronically signed by Jimmye Moulds MD on 09/22/2023 at 9:15:32 AM.    Final    Narrative & Impression   CLINICAL DATA:  Chronic right shoulder pain. Rotator cuff disorder suspected.   EXAM: MRI OF THE RIGHT SHOULDER WITHOUT CONTRAST   TECHNIQUE: Multiplanar, multisequence MR imaging of the shoulder was performed. No intravenous contrast was administered.   COMPARISON:  Right shoulder radiographs 03/30/2023   FINDINGS: Rotator cuff: There are two longitudinal linear fluid bright regions indicating partial-thickness midsubstance tears within the majority of the AP dimension of the supraspinatus tendon footprint, in a region measuring up to 7 mm in transverse dimension (coronal series 107 images 7 through 10) and 15 mm in AP dimension (sagittal series 110 image 4). Moderate underlying supraspinatus tendinosis. No tendon retraction. Moderate to high-grade mid and anterior infraspinatus intermediate T2 signal and thickening tendinosis. There is mild fluid at the deep aspect of the more proximal infraspinatus musculotendinous junction (sagittal image 7). There is also mild-to-moderate fluid centered anterior and deep to the infraspinatus musculotendinous junction and measuring up to 19 mm in AP dimension (sagittal images 9 through 14) and posterior deep to the infraspinatus musculotendinous junction measuring up to 17 mm in AP dimension (sagittal images 8 through 11), with numerous tiny decreased T1 and decreased T2 signal bodies within this fluid. This suggests infraspinatus muscle interstitial tears. The subscapularis and teres minor are intact.   Muscles: No rotator cuff muscle atrophy, fatty infiltration, or edema.   Biceps long head: The intra-articular long head of the biceps tendon is intact.   Acromioclavicular Joint: There are moderate degenerative changes of the acromioclavicular joint including joint space narrowing, subchondral marrow edema, and peripheral osteophytosis. Type III acromion, with mild downsloping of the anterolateral acromion. Trace fluid within the  subacromial/subdeltoid bursa.   Glenohumeral Joint: Mild to moderate glenohumeral cartilage thinning. No glenohumeral joint effusion.   Labrum: Mild intermediate T2 signal degenerative change within the posteroinferior glenoid labrum (axial images 16 and 17).   Bones:  No acute fracture.   Other: None.   IMPRESSION: 1. Two longitudinal linear fluid bright regions indicating partial-thickness midsubstance tears within the majority of the AP dimension of the supraspinatus tendon footprint. Overall these tears measure up to 7 mm in transverse dimension and 15 mm in AP dimension. Moderate underlying supraspinatus tendinosis. No tendon retraction. 2. Moderate to high-grade mid and anterior infraspinatus tendinosis. There is mild-to-moderate fluid centered anterior and deep to the infraspinatus musculotendinous junction and posterior deep to the infraspinatus musculotendinous junction, with numerous tiny decreased T1 and decreased T2 signal bodies within this fluid. This suggests infraspinatus muscle interstitial tears. 3. Moderate degenerative changes of the acromioclavicular joint. Type III acromion, with mild downsloping of the anterolateral acromion. 4. Mild to moderate glenohumeral cartilage thinning.     Electronically Signed   By: Bertina Broccoli M.D.   On: 08/13/2023 15:10   Narrative & Impression  CLINICAL DATA:  Knee injury due to a mechanical fall around 2 p.m.  Trip and fall injury. Right knee pain. Not improved with conservative therapy. Positive thinners. Mostly pain on the medial side.   EXAM: RIGHT KNEE - COMPLETE 4+ VIEW   COMPARISON:  06/06/2022   FINDINGS: Mild degenerative changes with mild medial compartment narrowing and small osteophyte formation in the right knee. Small patellar osteophytes. No evidence of acute fracture or dislocation. No focal bone lesion or bone destruction. Soft tissue swelling over the prepatellar fat. No significant effusion.  Postoperative changes demonstrated in the distal femoral shaft, incompletely visualized. Soft tissue calcification in the popliteal fossa is unchanged since prior study, possibly dystrophic or vascular.   IMPRESSION: No acute displaced fractures are identified. Mild degenerative changes. Prepatellar soft tissue swelling.     Electronically Signed   By: Boyce Byes M.D.   On: 09/01/2023 17:37    PMFS History: Patient Active Problem List   Diagnosis Date Noted   Trochanteric bursitis, left hip 06/19/2023   Arthritis of right ankle 02/04/2023   Post-traumatic osteoarthritis, right ankle and foot 02/18/2022   Toe pain, right 02/18/2022   Essential hypertension 08/28/2021   Low back pain 06/27/2021   Acute embolism and thrombosis of unspecified deep veins of unspecified lower extremity (HCC) 06/03/2021   Anal fistula 06/03/2021   Anxiety 06/03/2021   Erectile dysfunction 06/03/2021   Essential tremor 06/03/2021   History of lymphoma 06/03/2021   Impacted cerumen 06/03/2021   Peripheral neuropathy 06/03/2021   History of colonic polyps 06/03/2021   Rectal fistula 06/03/2021   Inflamed seborrheic keratosis 06/03/2021   Vitamin D  deficiency 06/03/2021   Tremor 06/03/2021   Eustachian tube dysfunction, left 12/10/2020   Otalgia, left 12/10/2020   Nondisplaced fracture of distal phalanx of left middle finger, initial encounter for open fracture 09/18/2020   Pain in left hip 03/27/2020   Laceration of right ring finger 05/05/2019   Laceration of flexor muscle, fascia and tendon of right ring finger at wrist and hand level, initial encounter 04/23/2019   Unspecified fracture of the lower end of right radius, initial encounter for closed fracture 04/23/2019   Fracture of unspecified phalanx of right ring finger, initial encounter for open fracture 04/23/2019   Left knee pain 06/14/2018   Pain in left ankle and joints of left foot 12/18/2017   Bilateral edema of lower extremity  10/30/2015   Onychomycosis 10/30/2015   Sepsis (HCC) 08/31/2015   History of pulmonary embolism    History of DVT (deep vein thrombosis)    Pyrexia    Cellulitis 03/18/2015   Chronic anticoagulation 03/17/2015   Morbid obesity with BMI of 45.0-49.9, adult (HCC) 03/17/2015   S/P IVC filter 03/17/2015   Genetic testing 01/12/2015   Postoperative wound infection 12/09/2013   Postop check 11/24/2013   Ulcer of lower limb (HCC) 08/24/2013   Inguinal lymphadenopathy 08/16/2013   Encounter for therapeutic drug monitoring 06/09/2013   Left leg cellulitis 04/04/2013   Osteoarthritis of left knee 11/02/2012   Cellulitis of right lower extremity 04/30/2012   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  04/29/2012   S/P splenectomy 04/29/2012   Incisional hernia 05/27/2011   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   Morbid obesity (HCC) 04/19/2008   Obstructive sleep apnea 04/19/2008   VARICOSE VEINS, LOWER EXTREMITIES 04/19/2008   PULMONARY EMBOLISM, HX OF 04/19/2008   Past Medical History:  Diagnosis Date   Anorectal fistula    Arthritis    Blood transfusion without reported diagnosis    Cancer (HCC)    lymphoma hx  Cataract    Cellulitis 04/04/2013   DVT (deep venous thrombosis) (HCC)    2011 right leg   History of lymphoma    History of pulmonary embolus (PE)    Incisional hernia    Morbid obesity (HCC)    OSA on CPAP    cpap  10 yrs   Varicose veins of lower extremities    pe    Family History  Problem Relation Age of Onset   Heart disease Father    Varicose Veins Father    Heart attack Father        dx. 39s   Heart disease Mother 55   Varicose Veins Mother    Pancreatic cancer Sister 40   Cancer Maternal Grandmother        either stomach or pancreatic primary    Pancreatic cancer Maternal Grandfather        dx. 50s   Lung cancer Maternal Aunt        secondhand smoke exposure   Heart attack Paternal Uncle    Rectal cancer Maternal Aunt        dx. 80s    Cancer Maternal Aunt        unknown type   Diabetes Paternal Uncle    Cancer Cousin        unknown type   Breast cancer Cousin        dx. 83s    Past Surgical History:  Procedure Laterality Date   EYE SURGERY Left 03   cat, detached ret     FRACTURE SURGERY     HERNIA REPAIR     INGUINAL LYMPH NODE BIOPSY [name] 11/15/2013   Procedure: RIGHT INGUINAL LYMPH NODE BIOPSY;  Surgeon: Diantha Fossa, MD;  Location: [facility] OR;  Service: General;  Laterality: [name];   ivc filter     PROSTATE SURGERY     (patient denies)   SPLENECTOMY     TONSILLECTOMY     age 37   TOTAL KNEE ARTHROPLASTY Left 11/02/2012   Procedure: TOTAL KNEE ARTHROPLASTY with revision tibia;  Surgeon: Shirlee Dotter, MD;  Location: [facility] OR;  Service: Orthopedics;  Laterality: Left;   Social History   Occupational History   Not on file  Tobacco Use   Smoking status: Former    Current packs/day: 0.00    Average packs/day: 2.0 packs/day for 20.0 years (40.0 ttl pk-yrs)    Types: Cigarettes    Start date: 05/05/1964    Quit date: 05/05/1984    Years since quitting: 39.4   Smokeless tobacco: Never  Vaping Use   Vaping status: Never Used  Substance and Sexual Activity   Alcohol use: Yes    Alcohol/week: 0.0 standard drinks of alcohol    Comment: maybe 1 beer/month   Drug use: No   Sexual activity: Yes    Birth control/protection: None

## 2023-10-05 ENCOUNTER — Telehealth: Payer: Self-pay

## 2023-10-05 NOTE — Telephone Encounter (Signed)
 I left Marc Mills a detailed message to return my call at my direct line.

## 2023-10-14 ENCOUNTER — Ambulatory Visit: Payer: Self-pay | Admitting: Oncology

## 2023-10-14 ENCOUNTER — Inpatient Hospital Stay

## 2023-10-14 ENCOUNTER — Inpatient Hospital Stay: Attending: Oncology | Admitting: Oncology

## 2023-10-14 VITALS — BP 171/94 | HR 66 | Temp 98.9°F | Resp 19 | Ht 71.0 in | Wt 326.2 lb

## 2023-10-14 DIAGNOSIS — Z86718 Personal history of other venous thrombosis and embolism: Secondary | ICD-10-CM | POA: Diagnosis present

## 2023-10-14 DIAGNOSIS — Z7901 Long term (current) use of anticoagulants: Secondary | ICD-10-CM | POA: Insufficient documentation

## 2023-10-14 DIAGNOSIS — L03115 Cellulitis of right lower limb: Secondary | ICD-10-CM | POA: Insufficient documentation

## 2023-10-14 DIAGNOSIS — R791 Abnormal coagulation profile: Secondary | ICD-10-CM | POA: Diagnosis not present

## 2023-10-14 DIAGNOSIS — M19071 Primary osteoarthritis, right ankle and foot: Secondary | ICD-10-CM | POA: Insufficient documentation

## 2023-10-14 DIAGNOSIS — Z86711 Personal history of pulmonary embolism: Secondary | ICD-10-CM | POA: Insufficient documentation

## 2023-10-14 DIAGNOSIS — Z9081 Acquired absence of spleen: Secondary | ICD-10-CM | POA: Insufficient documentation

## 2023-10-14 DIAGNOSIS — Z8572 Personal history of non-Hodgkin lymphomas: Secondary | ICD-10-CM | POA: Diagnosis not present

## 2023-10-14 DIAGNOSIS — C8307 Small cell B-cell lymphoma, spleen: Secondary | ICD-10-CM

## 2023-10-14 DIAGNOSIS — Z87891 Personal history of nicotine dependence: Secondary | ICD-10-CM | POA: Diagnosis not present

## 2023-10-14 LAB — CMP (CANCER CENTER ONLY)
ALT: 17 U/L (ref 0–44)
AST: 18 U/L (ref 15–41)
Albumin: 4.3 g/dL (ref 3.5–5.0)
Alkaline Phosphatase: 62 U/L (ref 38–126)
Anion gap: 4 — ABNORMAL LOW (ref 5–15)
BUN: 18 mg/dL (ref 8–23)
CO2: 32 mmol/L (ref 22–32)
Calcium: 9.5 mg/dL (ref 8.9–10.3)
Chloride: 103 mmol/L (ref 98–111)
Creatinine: 0.75 mg/dL (ref 0.61–1.24)
GFR, Estimated: >60 mL/min (ref >60)
Glucose, Bld: 100 mg/dL — ABNORMAL HIGH (ref 70–99)
Potassium: 4.5 mmol/L (ref 3.5–5.1)
Sodium: 139 mmol/L (ref 135–145)
Total Bilirubin: 0.5 mg/dL (ref 0.0–1.2)
Total Protein: 7.1 g/dL (ref 6.5–8.1)

## 2023-10-14 LAB — CBC WITH DIFFERENTIAL (CANCER CENTER ONLY)
Abs Immature Granulocytes: 0.03 10*3/uL (ref 0.00–0.07)
Basophils Absolute: 0.1 10*3/uL (ref 0.0–0.1)
Basophils Relative: 1 %
Eosinophils Absolute: 0.4 10*3/uL (ref 0.0–0.5)
Eosinophils Relative: 6 %
HCT: 44.3 % (ref 39.0–52.0)
Hemoglobin: 15 g/dL (ref 13.0–17.0)
Immature Granulocytes: 0 %
Lymphocytes Relative: 9 %
Lymphs Abs: 0.6 10*3/uL — ABNORMAL LOW (ref 0.7–4.0)
MCH: 30.1 pg (ref 26.0–34.0)
MCHC: 33.9 g/dL (ref 30.0–36.0)
MCV: 88.8 fL (ref 80.0–100.0)
Monocytes Absolute: 1.4 10*3/uL — ABNORMAL HIGH (ref 0.1–1.0)
Monocytes Relative: 20 %
Neutro Abs: 4.7 10*3/uL (ref 1.7–7.7)
Neutrophils Relative %: 64 %
Platelet Count: 270 10*3/uL (ref 150–400)
RBC: 4.99 MIL/uL (ref 4.22–5.81)
RDW: 16.5 % — ABNORMAL HIGH (ref 11.5–15.5)
WBC Count: 7.3 10*3/uL (ref 4.0–10.5)
nRBC: 0 % (ref 0.0–0.2)

## 2023-10-14 LAB — LACTATE DEHYDROGENASE: LDH: 131 U/L (ref 98–192)

## 2023-10-14 LAB — D-DIMER, QUANTITATIVE: D-Dimer, Quant: 1.53 ug{FEU}/mL — ABNORMAL HIGH (ref 0.00–0.50)

## 2023-10-14 NOTE — Progress Notes (Signed)
 [facility]  HEMATOLOGY CLINIC CONSULTATION NOTE   PATIENT NAME: Marc Mills   MR#: [id number] DOB: 1946/03/12  DATE OF SERVICE: 10/14/2023   REFERRING PROVIDER  Yolanda Hence, MD   Patient Care Team: Yolanda Hence, MD as PCP - General (Internal Medicine) Jerryl Morin, MD as Consulting Physician (General Surgery) Shirlee Dotter, MD (Inactive) as Consulting Physician (Orthopedic Surgery) Mertha Abrahams, MD as Attending Physician (Internal Medicine)   REASON FOR CONSULTATION/ CHIEF COMPLAINT:  Management decisions regarding anticoagulation and duration.  Also has history of splenic lymphoma status post splenectomy in 2011.  ASSESSMENT & PLAN:  Marc Mills is a 78 y.o. very pleasant gentleman with a past medical history of splenic lymphoma s/p splenectomy in 2011, did not need adjuvant treatments, 2 episodes of DVT and 1 episode of pulmonary embolism, on chronic anticoagulation, status post IVC filter placement, was referred to our service for discussion regarding duration of anticoagulation.  History of DVT (deep vein thrombosis) Venous thromboembolism with episodes of clots in the legs and lungs. Currently on Xarelto . Concerns about bleeding risk with anticoagulation, especially with increased fall risk due to age. Discussion about reducing Xarelto  dose to prophylactic dosing to balance clot prevention and bleeding risk. Consideration of NSAIDs for arthritis pain management with reduced Xarelto  dose. IVC filter placed in 2001, likely with scar tissue formation, making removal risky. IVC filter increases risk of downstream clots, necessitating indefinite anticoagulation. - Order thrombophilia workup to check for genetic propensity towards clotting. - Reduce Xarelto  dose to half if blood tests are negative. - Allow occasional NSAID use (e.g., Aleve) once daily with reduced Xarelto  dose, monitoring for signs of bleeding. - Discuss potential  side effects of NSAIDs, including renal dysfunction and acid reflux. - Advise staying hydrated and monitoring for stomach distress.  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 No recurrence post-surgery with regular surveillance and beta globulin tests showing static results. - Check beta 2 microglobulin level today.  Arthritis of right ankle Chronic arthritis pain managed with Tylenol . Discussion about using NSAIDs for better pain control with reduced Xarelto  dose. Tramadol  found ineffective. Celebrex  considered as a stronger NSAID option. - Consider Celebrex  for arthritis pain management with reduced Xarelto  dose. - Monitor for signs of bleeding with NSAID use.  Cellulitis of right lower extremity Chronic cellulitis managed by infectious disease specialist with Cefalexin.    I reviewed lab results and outside records for this visit and discussed relevant results with the patient. Diagnosis, plan of care and treatment options were also discussed in detail with the patient. Opportunity provided to ask questions and answers provided to his apparent satisfaction. Provided instructions to call our clinic with any problems, questions or concerns prior to return visit. I recommended to continue follow-up with PCP and sub-specialists. He verbalized understanding and agreed with the plan. No barriers to learning was detected.  Yaneisy Wenz, MD   [facility] - A DEPT OF [facility] [address] [place] [address] Dept: [phone] Dept Fax: [phone]   HISTORY OF PRESENT ILLNESS:   Discussed the use of AI scribe software for clinical note transcription with the patient, who gave verbal consent to proceed.  History of Present Illness Marc Mills is a 78 year old male with a history of lymphoma and venous thromboembolism who presents for evaluation of  anticoagulation management.  He has a  history of an unusual lymphoma encapsulated in his spleen, which was surgically removed in 2011. Post-surgery, he underwent surveillance with scans and annual beta globulin tests, all of which have remained static with no recurrence of cancer.  He has a history of venous thromboembolism, with two episodes of deep vein thrombosis (DVT) in the legs and one pulmonary embolism (PE) in 2001. The first DVT occurred in his left leg a week after being stung by bees, and the second was unprovoked while waiting to travel. He has been on anticoagulation therapy since then, initially with Coumadin  and later switched to Xarelto . He currently takes Xarelto  and has a vena cava filter placed in 2001.  He experienced a recent fall resulting in a large hematoma on his right knee, which led to cellulitis. He is concerned about the risks associated with blood thinners, especially as he ages and his fall risk increases.  He has chronic cellulitis and is under the care of an infectious disease specialist, taking Cefalexin for several years. He experiences arthritis pain, which has worsened with age, and currently manages it with Tylenol , as he avoids NSAIDs due to bleeding risk.  No fever, chills, or night sweats. No heart issues or atrial fibrillation. He is concerned about the balance between preventing clots and managing pain effectively.  He was last seen in our clinic in May 2018 by Dr. Asencion Blacksmith for patient's history of splenic lymphoma diagnosed in May 2011 s/p splenectomy followed by surveillance.  Patient also had recurrent history of PE and DVTs and plan was for lifelong anticoagulation.   HEMATOLOGY/ONCOLOGY HISTORY:  Marc Mills was feeling just fine in April of 2011 when he had an accidental fall leading to right wrist fracture.  He had been on lifelong Coumadin  for reasons discussed below, so his Coumadin  was held for a few weeks pending the need for surgery.   He had successful repair of the right wrist, however, he had a new clot in his right leg leading to IVC filter placement in May of 2011.     When he went back to Dr. Rosalio Comas as part of the physical examination in May, Dr. Rosalio Comas describes a large new abdominal mass and he obtained an ultrasound of the abdomen May 10, which showed an enlarged spleen (28 cm maximally) with anechoic central cavity felt to represent large resolving hematoma.  There was no flow within this lesion.  Note that the patient had had a CT of the abdomen in April of 2006 for unrelated reasons, which describes "a few small sub-centimeter low-attenuation structures" in the spleen, felt to be consistent with a benign process.     On August 5th Dr. Rosalio Comas repeated an abdominal ultrasound to make sure the hematoma was resolving.  The splenic hematoma was slightly smaller, but not resolved, and so a repeat CT of the abdomen on August 24th showed the splenic size to have been essentially unchanged, and the splenic hematoma to be slightly larger (21 versus 20 cm prior). Incidentally, no adenopathy was noted associated with this.    Given the risk of further bleeding in this anticoagulated patient, and given the increase in the apparent hematoma, Dr. Jerryl Morin agreed to take the patient and proceeded to splenectomy December 29, 2009.   The postoperative course was unremarkable.     The pathology, however, showed a large cell, non-Hodgkin's lymphoma, which appeared high-grade, and was positive for CD79A, CD10, and BCL6.  It was negative for CD20.  CD3 showed some cytoplasmic positivity.  CD34, TDT and lambda and kappa were all negative, as was CD4 and CD45B. CD5 and CD8 were likewise negative.   B-cell (but CD20 negative) large cell non-Hodgkin's lymphoma, clinically confined to the spleen, with flow cytometry not suggestive of a marginal zone lymphoma (the cells being CD10 and bcl-6 positive, with some cytoplasmic CD3 positivity) followed with  observation only with no evidence of disease recurrence to date.   Right inguinal lymph node biopsy 11/15/2013 showed lymphoid hyperplasia associated with angiomatous hamartoma, with flow cytometry showing no monoclonal B-cell population.   Since he remained in remission, he was discharged from our office after his last visit on May 2018.  His PCP referred him back to us  for recommendations regarding anticoagulation.  Given IVC filter in place, which can be thrombogenic in itself, recommendation is for at least prophylactic dose of anticoagulation, if not full.  Patient is concerned about increased risk of falls as he ages and also inability to take NSAIDs (for his worsening arthritis) while on anticoagulation.   MEDICAL HISTORY Past Medical History:  Diagnosis Date   Anorectal fistula    Arthritis    Blood transfusion without reported diagnosis    Cancer (HCC)    lymphoma hx   Cataract    Cellulitis 04/04/2013   DVT (deep venous thrombosis) (HCC)    2011 right leg   History of lymphoma    History of pulmonary embolus (PE)    Incisional hernia    Morbid obesity (HCC)    OSA on CPAP    cpap  10 yrs   Sepsis (HCC) 08/31/2015   Varicose veins of lower extremities    pe     SURGICAL HISTORY Past Surgical History:  Procedure Laterality Date   EYE SURGERY Left 03   cat, detached ret     FRACTURE SURGERY     HERNIA REPAIR     INGUINAL LYMPH NODE BIOPSY [name] 11/15/2013   Procedure: RIGHT INGUINAL LYMPH NODE BIOPSY;  Surgeon: Diantha Fossa, MD;  Location: [facility] OR;  Service: General;  Laterality: [name];   ivc filter     PROSTATE SURGERY     (patient denies)   SPLENECTOMY     TONSILLECTOMY     age 63   TOTAL KNEE ARTHROPLASTY Left 11/02/2012   Procedure: TOTAL KNEE ARTHROPLASTY with revision tibia;  Surgeon: Shirlee Dotter, MD;  Location: [facility] OR;  Service: Orthopedics;  Laterality: Left;     SOCIAL HISTORY: He reports that he quit smoking about 39 years ago. His smoking use included  cigarettes. He started smoking about 59 years ago. He has a 40 pack-year smoking history. He has never used smokeless tobacco. He reports current alcohol use. He reports that he does not use drugs. Social History   Socioeconomic History   Marital status: Married    Spouse name: Not on file   Number of children: Not on file   Years of education: Not on file   Highest education level: Not on file  Occupational History   Not on file  Tobacco Use   Smoking status: Former    Current packs/day: 0.00    Average packs/day: 2.0 packs/day for 20.0 years (40.0 ttl pk-yrs)    Types: Cigarettes    Start date: 05/05/1964    Quit date: 05/05/1984    Years since quitting: 39.4   Smokeless tobacco: Never  Vaping Use   Vaping status: Never Used  Substance and Sexual Activity   Alcohol use: Yes  Alcohol/week: 0.0 standard drinks of alcohol    Comment: maybe 1 beer/month   Drug use: No   Sexual activity: Yes    Birth control/protection: None  Other Topics Concern   Not on file  Social History Narrative   Not on file   Social Drivers of Health   Financial Resource Strain: Low Risk  (07/05/2022)   Received from [facility]   Overall Financial Resource Strain (CARDIA)    Difficulty of Paying Living Expenses: Not hard at all  Food Insecurity: No Food Insecurity (10/14/2023)   Hunger Vital Sign    Worried About Running Out of Food in the Last Year: Never true    Ran Out of Food in the Last Year: Never true  Transportation Needs: No Transportation Needs (10/14/2023)   PRAPARE - Administrator, Civil Service (Medical): No    Lack of Transportation (Non-Medical): No  Physical Activity: Sufficiently Active (07/05/2022)   Received from [facility]   Exercise Vital Sign    On average, how many days per week do you engage in moderate to strenuous exercise (like a brisk walk)?: 6 days    On average, how many minutes do you engage in exercise at this level?: 40 min  Stress: No Stress  Concern Present (07/05/2022)   Received from [facility] - Occupational Stress Questionnaire    Feeling of Stress : Only a little  Social Connections: Socially Integrated (07/05/2022)   Received from [facility]   Social Network    How would you rate your social network (family, work, friends)?: Good participation with social networks  Intimate Partner Violence: Not At Risk (10/14/2023)   Humiliation, Afraid, Rape, and Kick questionnaire    Fear of Current or Ex-Partner: No    Emotionally Abused: No    Physically Abused: No    Sexually Abused: No    FAMILY HISTORY: His family history includes Breast cancer in his cousin; Cancer in his cousin, maternal aunt, and maternal grandmother; Diabetes in his paternal uncle; Heart attack in his father and paternal uncle; Heart disease in his father; Heart disease (age of onset: 84) in his mother; Lung cancer in his maternal aunt; Pancreatic cancer in his maternal grandfather; Pancreatic cancer (age of onset: 95) in his sister; Rectal cancer in his maternal aunt; Varicose Veins in his father and mother.  CURRENT MEDICATIONS   Current Outpatient Medications  Medication Instructions   acetaminophen  (TYLENOL ) 1,000 mg, Daily   B Complex Vitamins (VITAMIN B COMPLEX) TABS 1 tablet, Daily   cephALEXin  (KEFLEX ) 500 mg, 3 times daily   Coenzyme Q10 (COQ-10) 30 MG CAPS 1 capsule, Daily   diclofenac Sodium (VOLTAREN) 2 g, Daily PRN   famotidine  (PEPCID ) 20 mg, Every evening   Multiple Vitamin (MULTIVITAMIN ADULT) TABS 1 tablet, Daily   Multiple Vitamins-Minerals (ONCOVITE) TABS 1 tablet, Daily   NAC 500 mg, Daily   Nutritional Supplements (SALMON OIL) CAPS 1 capsule, Daily   oxyCODONE -acetaminophen  (PERCOCET/ROXICET) 5-325 MG tablet 1 tablet, Oral, Every 6 hours PRN   Quercetin 50 mg, Daily   rivaroxaban  (XARELTO ) 20 mg, Daily   sildenafil  (REVATIO ) 20 mg, Daily PRN   Vitamin C 500 mg, Daily   Vitamin D3 Ultra  Strength 5,000 Units, Daily   vitamin E 400 Units, Daily     ALLERGIES  He is allergic to hydrocodone, hydrocodone-guaifenesin , hydromorphone  hcl, and oxycodone .  REVIEW OF SYSTEMS:  Review of Systems  Cardiovascular:  Positive for  leg swelling.  Musculoskeletal:  Positive for arthralgias.     Rest of the pertinent review of systems is unremarkable except as mentioned above in HPI.  PHYSICAL EXAMINATION:     Onc Performance Status - 10/16/23 1700       ECOG Perf Status   ECOG Perf Status Fully active, Marc to carry on all pre-disease performance without restriction      KPS SCALE   KPS % SCORE Marc to carry on normal activity, minor s/s of disease           Vitals:   10/14/23 0921 10/14/23 0922  BP: (!) 158/69 (!) 171/94  Pulse: 66   Resp: 19   Temp: 98.9 F (37.2 C)   SpO2: 96%    Filed Weights   10/14/23 0921  Weight: (!) 326 lb 3.2 oz (148 kg)    Physical Exam Constitutional:      General: He is not in acute distress.    Appearance: Normal appearance.  HENT:     Head: Normocephalic and atraumatic.   Eyes:     Conjunctiva/sclera: Conjunctivae normal.    Cardiovascular:     Rate and Rhythm: Normal rate and regular rhythm.  Pulmonary:     Effort: Pulmonary effort is normal. No respiratory distress.  Abdominal:     General: There is no distension.   Musculoskeletal:     Right lower leg: Edema present.     Left lower leg: Edema present.   Neurological:     General: No focal deficit present.     Mental Status: He is alert and oriented to person, place, and time.   Psychiatric:        Mood and Affect: Mood normal.        Behavior: Behavior normal.      LABORATORY DATA:   I have reviewed the data as listed.  Results for orders placed or performed in visit on 10/14/23  CBC with Differential (Cancer Center Only)  Result Value Ref Range   WBC Count 7.3 4.0 - 10.5 K/uL   RBC 4.99 4.22 - 5.81 MIL/uL   Hemoglobin 15.0 13.0 - 17.0 g/dL    HCT 16.1 09.6 - 04.5 %   MCV 88.8 80.0 - 100.0 fL   MCH 30.1 26.0 - 34.0 pg   MCHC 33.9 30.0 - 36.0 g/dL   RDW 40.9 (H) 81.1 - 91.4 %   Platelet Count 270 150 - 400 K/uL   nRBC 0.0 0.0 - 0.2 %   Neutrophils Relative % 64 %   Neutro Abs 4.7 1.7 - 7.7 K/uL   Lymphocytes Relative 9 %   Lymphs Abs 0.6 (L) 0.7 - 4.0 K/uL   Monocytes Relative 20 %   Monocytes Absolute 1.4 (H) 0.1 - 1.0 K/uL   Eosinophils Relative 6 %   Eosinophils Absolute 0.4 0.0 - 0.5 K/uL   Basophils Relative 1 %   Basophils Absolute 0.1 0.0 - 0.1 K/uL   Immature Granulocytes 0 %   Abs Immature Granulocytes 0.03 0.00 - 0.07 K/uL  CMP (Cancer Center only)  Result Value Ref Range   Sodium 139 135 - 145 mmol/L   Potassium 4.5 3.5 - 5.1 mmol/L   Chloride 103 98 - 111 mmol/L   CO2 32 22 - 32 mmol/L   Glucose, Bld 100 (H) 70 - 99 mg/dL   BUN 18 8 - 23 mg/dL   Creatinine 7.82 9.56 - 1.24 mg/dL   Calcium  9.5 8.9 - 10.3 mg/dL  Total Protein 7.1 6.5 - 8.1 g/dL   Albumin 4.3 3.5 - 5.0 g/dL   AST 18 15 - 41 U/L   ALT 17 0 - 44 U/L   Alkaline Phosphatase 62 38 - 126 U/L   Total Bilirubin 0.5 0.0 - 1.2 mg/dL   GFR, Estimated >16 >10 mL/min   Anion gap 4 (L) 5 - 15  D-dimer, quantitative  Result Value Ref Range   D-Dimer, Quant 1.53 (H) 0.00 - 0.50 ug/mL-FEU  Beta-2 -glycoprotein i abs, IgG/M/A  Result Value Ref Range   Beta-2  Glyco I IgG 17 0 - 20 GPI IgG units   Beta-2 -Glycoprotein I IgM 101 (H) 0 - 32 GPI IgM units   Beta-2 -Glycoprotein I IgA <9 0 - 25 GPI IgA units  Cardiolipin antibodies, IgG, IgM, IgA  Result Value Ref Range   Anticardiolipin IgG 28 (H) 0 - 14 GPL U/mL   Anticardiolipin IgM 72 (H) 0 - 12 MPL U/mL   Anticardiolipin IgA <9 0 - 11 APL U/mL  Lactate dehydrogenase  Result Value Ref Range   LDH 131 98 - 192 U/L  Beta 2 microglobulin, serum  Result Value Ref Range   Beta-2  Microglobulin 1.7 0.6 - 2.4 mg/L     RADIOGRAPHIC STUDIES:  I have personally reviewed the radiological images as listed  and agreed with the findings in the report.  VAS US  LOWER EXTREMITY VENOUS (DVT) Result Date: 09/22/2023  Lower Venous DVT Study Patient Name:  Marc Mills  Date of Exam:   09/21/2023 Medical Rec #: [id number]           Accession #:    [id number] Date of Birth: [birth year]           Patient Gender: M Patient Age:   58 years Exam Location:  [address] Procedure:      VAS US  LOWER EXTREMITY VENOUS (DVT) Referring Phys: Bernabe Brew BROOKS --------------------------------------------------------------------------------  Indications: Swelling, Edema, and Pain.  Risk Factors: DVT Hx Trauma Recent fall obesity. Limitations: Body habitus. Comparison Study: 05/18/15 Performing Technologist: Estanislao Heimlich  Examination Guidelines: A complete evaluation includes B-mode imaging, spectral Doppler, color Doppler, and power Doppler as needed of all accessible portions of each vessel. Bilateral testing is considered an integral part of a complete examination. Limited examinations for reoccurring indications may be performed as noted. The reflux portion of the exam is performed with the patient in reverse Trendelenburg.  +---------+---------------+---------+-----------+----------+-------------------+ RIGHT    CompressibilityPhasicitySpontaneityPropertiesThrombus Aging      +---------+---------------+---------+-----------+----------+-------------------+ CFV      Full           Yes      Yes                                      +---------+---------------+---------+-----------+----------+-------------------+ SFJ      Full                                                             +---------+---------------+---------+-----------+----------+-------------------+ FV Prox  Full                                                             +---------+---------------+---------+-----------+----------+-------------------+  FV Mid   Full                                                              +---------+---------------+---------+-----------+----------+-------------------+ FV DistalFull                    Yes                  Not well visualized +---------+---------------+---------+-----------+----------+-------------------+ PFV      Full                                                             +---------+---------------+---------+-----------+----------+-------------------+ POP      Full           Yes      Yes                                      +---------+---------------+---------+-----------+----------+-------------------+ PTV      Full                    Yes                  Not well visualized +---------+---------------+---------+-----------+----------+-------------------+ PERO                                                  Not visualized      +---------+---------------+---------+-----------+----------+-------------------+   +----+---------------+---------+-----------+----------+--------------+ LEFTCompressibilityPhasicitySpontaneityPropertiesThrombus Aging +----+---------------+---------+-----------+----------+--------------+ CFV Full           Yes      Yes                                 +----+---------------+---------+-----------+----------+--------------+     Summary: RIGHT: - There is no evidence of deep vein thrombosis in the lower extremity.  - No cystic structure found in the popliteal fossa.  LEFT: - No evidence of common femoral vein obstruction.   *See table(s) above for measurements and observations. Electronically signed by Jimmye Moulds MD on 09/22/2023 at 9:15:32 AM.    Final     Orders Placed This Encounter  Procedures   CBC with Differential (Cancer Center Only)    Standing Status:   Future    Number of Occurrences:   1    Expiration Date:   10/13/2024   CMP (Cancer Center only)    Standing Status:   Future    Number of Occurrences:   1    Expiration Date:   10/13/2024   D-dimer, quantitative    Standing Status:    Future    Number of Occurrences:   1    Expiration Date:   10/13/2024   Prothrombin gene mutation    Standing Status:   Future    Number of Occurrences:   1    Expiration Date:  10/13/2024   Factor 5 leiden    Standing Status:   Future    Number of Occurrences:   1    Expiration Date:   10/13/2024   Beta-2 -glycoprotein i abs, IgG/M/A    Standing Status:   Future    Number of Occurrences:   1    Expiration Date:   10/13/2024   Cardiolipin antibodies, IgG, IgM, IgA    Standing Status:   Future    Number of Occurrences:   1    Expiration Date:   10/13/2024   Lupus anticoagulant panel    Standing Status:   Future    Number of Occurrences:   1    Expiration Date:   10/13/2024   Protein C activity    Standing Status:   Future    Number of Occurrences:   1    Expiration Date:   10/13/2024   PROTEIN S PANEL    Standing Status:   Future    Number of Occurrences:   1    Expiration Date:   10/13/2024   Antithrombin panel    Standing Status:   Future    Number of Occurrences:   1    Expiration Date:   10/13/2024   Lactate dehydrogenase    Standing Status:   Future    Number of Occurrences:   1    Expiration Date:   10/13/2024   Beta 2 microglobulin, serum    Standing Status:   Future    Number of Occurrences:   1    Expiration Date:   10/13/2024    Future Appointments  Date Time Provider Department Center  10/19/2023  9:00 AM Shauna Del, DO [facility] None  10/28/2023  3:00 PM Sekou Zuckerman, Gale Jude, MD [facility] None  01/13/2024  8:15 AM [facility] None  01/13/2024  8:45 AM Jaymond Waage, MD [facility] None    I spent a total of 55 minutes during this encounter with the patient including review of chart and various tests results, discussions about plan of care and coordination of care plan.  This document was completed utilizing speech recognition software. Grammatical errors, random word insertions, pronoun errors, and incomplete sentences are an occasional consequence of  this system due to software limitations, ambient noise, and hardware issues. Any formal questions or concerns about the content, text or information contained within the body of this dictation should be directly addressed to the provider for clarification.

## 2023-10-15 LAB — BETA-2-GLYCOPROTEIN I ABS, IGG/M/A
Beta-2 Glyco I IgG: 17 GPI IgG units (ref 0–20)
Beta-2-Glycoprotein I IgA: <9 GPI IgA units (ref 0–25)
Beta-2-Glycoprotein I IgM: 101 GPI IgM units — ABNORMAL HIGH (ref 0–32)

## 2023-10-15 LAB — BETA 2 MICROGLOBULIN, SERUM: Beta-2 Microglobulin: 1.7 mg/L (ref 0.6–2.4)

## 2023-10-15 LAB — CARDIOLIPIN ANTIBODIES, IGG, IGM, IGA
Anticardiolipin IgA: <9 U/mL (ref 0–11)
Anticardiolipin IgG: 28 GPL U/mL — ABNORMAL HIGH (ref 0–14)
Anticardiolipin IgM: 72 [MPL'U]/mL — ABNORMAL HIGH (ref 0–12)

## 2023-10-16 ENCOUNTER — Encounter: Payer: Self-pay | Admitting: Oncology

## 2023-10-16 NOTE — Assessment & Plan Note (Signed)
 Chronic cellulitis managed by infectious disease specialist with Cefalexin.

## 2023-10-16 NOTE — Assessment & Plan Note (Signed)
 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 No recurrence post-surgery with regular surveillance and beta globulin tests showing static results. - Check beta 2 microglobulin level today.

## 2023-10-16 NOTE — Assessment & Plan Note (Signed)
 Chronic arthritis pain managed with Tylenol . Discussion about using NSAIDs for better pain control with reduced Xarelto  dose. Tramadol  found ineffective. Celebrex  considered as a stronger NSAID option. - Consider Celebrex  for arthritis pain management with reduced Xarelto  dose. - Monitor for signs of bleeding with NSAID use.

## 2023-10-16 NOTE — Assessment & Plan Note (Addendum)
 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 Currently on Xarelto . Concerns about bleeding risk with anticoagulation, especially with increased fall risk due to age. Discussion about reducing Xarelto  dose to prophylactic dosing to balance clot prevention and bleeding risk. Consideration of NSAIDs for arthritis pain management with reduced Xarelto  dose. IVC filter placed in 2001, likely with scar tissue formation, making removal risky. IVC filter increases risk of downstream clots, necessitating indefinite anticoagulation. - Order thrombophilia workup to check for genetic propensity towards clotting. - Reduce Xarelto  dose to half if blood tests are negative. - Allow occasional NSAID use (e.g., Aleve) once daily with reduced Xarelto  dose, monitoring for signs of bleeding. - Discuss potential side effects of NSAIDs, including renal dysfunction and acid reflux. - Advise staying hydrated and monitoring for stomach distress.

## 2023-10-18 LAB — PROTEIN C ACTIVITY: Protein C Activity: 109 % (ref 73–180)

## 2023-10-18 LAB — PROTEIN S PANEL
Protein S Activity: 112 % (ref 63–140)
Protein S Ag, Free: 108 % (ref 61–136)
Protein S Ag, Total: 68 % (ref 60–150)

## 2023-10-18 LAB — ANTITHROMBIN PANEL
AT III AG PPP IMM-ACNC: 101 % (ref 72–124)
Antithrombin Activity: 124 % (ref 75–135)

## 2023-10-19 ENCOUNTER — Ambulatory Visit (INDEPENDENT_AMBULATORY_CARE_PROVIDER_SITE_OTHER): Admitting: Sports Medicine

## 2023-10-19 ENCOUNTER — Encounter: Payer: Self-pay | Admitting: Sports Medicine

## 2023-10-19 DIAGNOSIS — M79661 Pain in right lower leg: Secondary | ICD-10-CM

## 2023-10-19 DIAGNOSIS — M75101 Unspecified rotator cuff tear or rupture of right shoulder, not specified as traumatic: Secondary | ICD-10-CM

## 2023-10-19 DIAGNOSIS — T148XXA Other injury of unspecified body region, initial encounter: Secondary | ICD-10-CM

## 2023-10-19 DIAGNOSIS — M7989 Other specified soft tissue disorders: Secondary | ICD-10-CM

## 2023-10-19 DIAGNOSIS — G8929 Other chronic pain: Secondary | ICD-10-CM

## 2023-10-19 DIAGNOSIS — M25511 Pain in right shoulder: Secondary | ICD-10-CM

## 2023-10-19 LAB — PROTHROMBIN GENE MUTATION

## 2023-10-19 LAB — LUPUS ANTICOAGULANT PANEL
DRVVT: >180 s — ABNORMAL HIGH (ref 0.0–47.0)
PTT Lupus Anticoagulant: 111.9 s — ABNORMAL HIGH (ref 0.0–43.5)

## 2023-10-19 LAB — FACTOR 5 LEIDEN

## 2023-10-19 LAB — HEXAGONAL PHASE PHOSPHOLIPID: Hexagonal Phase Phospholipid: 44 s — ABNORMAL HIGH (ref 0–11)

## 2023-10-19 LAB — PTT-LA MIX: PTT-LA Mix: 86.2 s — ABNORMAL HIGH (ref 0.0–40.5)

## 2023-10-19 LAB — DRVVT CONFIRM: dRVVT Confirm: >2.3 ratio — ABNORMAL HIGH (ref 0.8–1.2)

## 2023-10-19 LAB — DRVVT MIX: dRVVT Mix: 146.8 s — ABNORMAL HIGH (ref 0.0–40.4)

## 2023-10-19 NOTE — Progress Notes (Signed)
 Marc Mills - 78 y.o. male MRN [id number]  Date of birth: Mar 03, 1946  Office Visit Note: Visit Date: 10/19/2023 PCP: Yolanda Hence, MD Referred by: Yolanda Hence, MD  Subjective: Chief Complaint  Patient presents with   Right Shoulder - Pain    Patient is following up for right shoulder pain. Patient had a subacromial injection on 09/22/2023. Helped for a few weeks. Takes Tylenol  for pain. Worse when sleeping on it. He states he did do some yard work over the weekend.     HPI: Marc Mills is a pleasant 78 y.o. male who presents today for follow-up of chronic right shoulder, right leg swelling and hematoma.  Right shoulder - following our ultrasound-guided SAJ injection back 1 month ago, he was doing quite well.  This weekend he was doing a lot of gardening and reporting with his wife and feels like this flared up his shoulder.  Prior to this he had been doing well and had been returning to swimming and doing laps.  He knows with his pathology, he can expect some mild pain within the shoulder.  He is interested in discussing more about PRP injection therapy as well.  Right leg - his swelling and pain continues to improve. The circumference of his leg is down 3 inches since last time.  Still notices some mild pain over the medial side of the quadricep musculature.  He is wearing his compression sleeves for the leg.  His cellulitis has returned to normal and he continues to follow with ID on daily prophylaxis.  He is following with hematology and considering reducing his Xarelto  dose where he could consider using as needed NSAIDs, awaiting follow-up for lab results.  Pertinent ROS were reviewed with the patient and found to be negative unless otherwise specified above in HPI.   Assessment & Plan: Visit Diagnoses:  1. Chronic right shoulder pain   2. Tear of right supraspinatus tendon   3. Pain and swelling of right lower leg   4. Hematoma    Plan: Impression is  chronic right shoulder pain with the degree of cartilage loss/OA as well as notable high-grade partial-thickness tearing of the supraspinatus and to a lesser degree the infraspinatus.  Has received temporary benefit from ultrasound-guided injections as well as continuing his home exercise regimen, swimming, etc.  We did discuss PRP injection therapy and I did provide a handout for him to read about and see if this is something he would be interested in as well.  He will continue his HEP in the interim.  He continues to have resolution of his swelling and hematoma from his injury months ago.  His cellulitis has resolved as well and back to routine.  He will continue his preventative Keflex  via ID.  Continue compression sleeve, did recommend warm moist heat to the hematoma and soft digital massage to help resolve.  Did discuss this can take up to 3 months total to improve fully.  I will see him back in about 6 weeks to reevaluate, could consider ultrasound-guided injection into the right shoulder if needed at that time.  He will continue following up with hematology, if he does have his Xarelto  reduced, we could consider a short as needed course of NSAIDs.  We did discuss this role today. Malachi Screws will keep me updated.   Follow-up: Return in about 6 weeks (around 11/30/2023) for R-shoulder - 30-mins for poss inj.   Meds & Orders: No orders of the defined types were placed in  this encounter.  No orders of the defined types were placed in this encounter.    Procedures: No procedures performed      Clinical History: No specialty comments available.  He reports that he quit smoking about 39 years ago. His smoking use included cigarettes. He started smoking about 59 years ago. He has a 40 pack-year smoking history. He has never used smokeless tobacco. No results for input(s): HGBA1C, LABURIC in the last 8760 hours.  Objective:    Physical Exam  Gen: Well-appearing, in no acute distress; non-toxic CV:  Well-perfused. Warm.  Resp: Breathing unlabored on room air; no wheezing. Psych: Fluid speech in conversation; appropriate affect; normal thought process  Ortho Exam - Right leg: There is improving resolution of his swelling with lesser circumference.  Baseline cellulitic nature of the mid to distal right lower extremity.  There is palpable hematoma formation in the mid to distal quadricep but improved from previous visits.  There is no warmth or change in redness.  Imaging: No results found.  Past Medical/Family/Surgical/Social History: Medications & Allergies reviewed per EMR, new medications updated. Patient Active Problem List   Diagnosis Date Noted   Trochanteric bursitis, left hip 06/19/2023   Arthritis of right ankle 02/04/2023   Post-traumatic osteoarthritis, right ankle and foot 02/18/2022   Toe pain, right 02/18/2022   Essential hypertension 08/28/2021   Low back pain 06/27/2021   Acute embolism and thrombosis of unspecified deep veins of unspecified lower extremity (HCC) 06/03/2021   Anal fistula 06/03/2021   Anxiety 06/03/2021   Erectile dysfunction 06/03/2021   Essential tremor 06/03/2021   History of lymphoma 06/03/2021   Impacted cerumen 06/03/2021   Peripheral neuropathy 06/03/2021   History of colonic polyps 06/03/2021   Rectal fistula 06/03/2021   Inflamed seborrheic keratosis 06/03/2021   Vitamin D  deficiency 06/03/2021   Tremor 06/03/2021   Eustachian tube dysfunction, left 12/10/2020   Otalgia, left 12/10/2020   Nondisplaced fracture of distal phalanx of left middle finger, initial encounter for open fracture 09/18/2020   Pain in left hip 03/27/2020   Laceration of right ring finger 05/05/2019   Laceration of flexor muscle, fascia and tendon of right ring finger at wrist and hand level, initial encounter 04/23/2019   Unspecified fracture of the lower end of right radius, initial encounter for closed fracture 04/23/2019   Fracture of unspecified phalanx of  right ring finger, initial encounter for open fracture 04/23/2019   Left knee pain 06/14/2018   Pain in left ankle and joints of left foot 12/18/2017   Bilateral edema of lower extremity 10/30/2015   Onychomycosis 10/30/2015   History of pulmonary embolism    History of DVT (deep vein thrombosis)    Pyrexia    Cellulitis 03/18/2015   Chronic anticoagulation 03/17/2015   Morbid obesity with BMI of 45.0-49.9, adult (HCC) 03/17/2015   S/P IVC filter 03/17/2015   Genetic testing 01/12/2015   Postoperative wound infection 12/09/2013   Postop check 11/24/2013   Ulcer of lower limb (HCC) 08/24/2013   Inguinal lymphadenopathy 08/16/2013   Encounter for therapeutic drug monitoring 06/09/2013   Left leg cellulitis 04/04/2013   Osteoarthritis of left knee 11/02/2012   Cellulitis of right lower extremity 04/30/2012   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  04/29/2012   S/P splenectomy 04/29/2012   Incisional hernia 05/27/2011   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   Morbid obesity (HCC) 04/19/2008   Obstructive sleep apnea 04/19/2008   VARICOSE VEINS, LOWER EXTREMITIES 04/19/2008   PULMONARY EMBOLISM,  HX OF 04/19/2008   Past Medical History:  Diagnosis Date   Anorectal fistula    Arthritis    Blood transfusion without reported diagnosis    Cancer (HCC)    lymphoma hx   Cataract    Cellulitis 04/04/2013   DVT (deep venous thrombosis) (HCC)    2011 right leg   History of lymphoma    History of pulmonary embolus (PE)    Incisional hernia    Morbid obesity (HCC)    OSA on CPAP    cpap  10 yrs   Sepsis (HCC) 08/31/2015   Varicose veins of lower extremities    pe   Family History  Problem Relation Age of Onset   Heart disease Father    Varicose Veins Father    Heart attack Father        dx. 63s   Heart disease Mother 23   Varicose Veins Mother    Pancreatic cancer Sister 11   Cancer Maternal Grandmother        either stomach or pancreatic primary    Pancreatic  cancer Maternal Grandfather        dx. 50s   Lung cancer Maternal Aunt        secondhand smoke exposure   Heart attack Paternal Uncle    Rectal cancer Maternal Aunt        dx. 80s   Cancer Maternal Aunt        unknown type   Diabetes Paternal Uncle    Cancer Cousin        unknown type   Breast cancer Cousin        dx. 48s   Past Surgical History:  Procedure Laterality Date   EYE SURGERY Left 03   cat, detached ret     FRACTURE SURGERY     HERNIA REPAIR     INGUINAL LYMPH NODE BIOPSY [name] 11/15/2013   Procedure: RIGHT INGUINAL LYMPH NODE BIOPSY;  Surgeon: Diantha Fossa, MD;  Location: [facility] OR;  Service: General;  Laterality: [name];   ivc filter     PROSTATE SURGERY     (patient denies)   SPLENECTOMY     TONSILLECTOMY     age 2   TOTAL KNEE ARTHROPLASTY Left 11/02/2012   Procedure: TOTAL KNEE ARTHROPLASTY with revision tibia;  Surgeon: Shirlee Dotter, MD;  Location: [facility] OR;  Service: Orthopedics;  Laterality: Left;   Social History   Occupational History   Not on file  Tobacco Use   Smoking status: Former    Current packs/day: 0.00    Average packs/day: 2.0 packs/day for 20.0 years (40.0 ttl pk-yrs)    Types: Cigarettes    Start date: 05/05/1964    Quit date: 05/05/1984    Years since quitting: 39.4   Smokeless tobacco: Never  Vaping Use   Vaping status: Never Used  Substance and Sexual Activity   Alcohol use: Yes    Alcohol/week: 0.0 standard drinks of alcohol    Comment: maybe 1 beer/month   Drug use: No   Sexual activity: Yes    Birth control/protection: None   I spent 37 minutes in the care of the patient today including face-to-face time, preparation to see the patient, as well as review of hematology note, discussion on PRP injection therapy, handout provided, activity modification, and frequent injection and other nonsurgical treatment options for the above diagnoses.   Shauna Del, DO Primary Care Sports Medicine Physician  [facility] -  Orthopedics  This  note was dictated using Dragon naturally speaking software and may contain errors in syntax, spelling, or content which have not been identified prior to signing this note.

## 2023-10-28 ENCOUNTER — Inpatient Hospital Stay: Admitting: Oncology

## 2023-10-28 ENCOUNTER — Encounter: Payer: Self-pay | Admitting: Oncology

## 2023-10-28 DIAGNOSIS — Z86718 Personal history of other venous thrombosis and embolism: Secondary | ICD-10-CM | POA: Diagnosis not present

## 2023-10-28 MED ORDER — CELECOXIB 200 MG PO CAPS: 200.0000 mg | ORAL_CAPSULE | ORAL | 1 refills | Status: DC

## 2023-10-28 NOTE — Progress Notes (Signed)
 [facility]  HEMATOLOGY-ONCOLOGY ELECTRONIC VISIT PROGRESS NOTE  PATIENT NAME: Marc Mills   MR#: [id number] DOB: 1946-04-03  DATE OF SERVICE: 10/28/2023  Patient Care Team: [name], MD as PCP - General (Internal Medicine) [name], MD as Consulting Physician (General Surgery) [name] ORN, MD (Inactive) as Consulting Physician (Orthopedic Surgery) [name], MD as Attending Physician (Internal Medicine)  I connected with the patient via telephone conference and verified that I am speaking with the correct person using two identifiers. The patient's location is at home and I am providing care from the [facility].  I discussed the limitations, risks, security and privacy concerns of performing an evaluation and management service by e-visits and the availability of in person appointments. I also discussed with the patient that there may be a patient responsible charge related to this service. The patient expressed understanding and agreed to proceed.   ASSESSMENT & PLAN:   Marc Mills is a 78 y.o. very pleasant gentleman with a past medical history of splenic lymphoma s/p splenectomy in 2011, did not need adjuvant treatments, 2 episodes of DVT and 1 episode of pulmonary embolism, on chronic anticoagulation, status post IVC filter placement, was referred to our service for discussion regarding duration of anticoagulation.   History of DVT (deep vein thrombosis) Venous thromboembolism with episodes of clots in the legs and lungs. Currently on Xarelto . Concerns about bleeding risk with anticoagulation, especially with increased fall risk due to age. Discussion about reducing Xarelto  dose to prophylactic dosing to balance clot prevention and bleeding risk. Consideration of NSAIDs for arthritis pain management with reduced Xarelto  dose. IVC filter placed in 2001, likely with scar tissue formation, making removal risky. IVC filter increases  risk of downstream clots, necessitating indefinite anticoagulation.  On his consultation with us  on 10/14/2023, we pursued thrombophilia workup.  Prothrombin gene mutation, factor V Leiden mutation were negative.  Protein C activity, protein S activity, Antithrombin III activity were all within normal limits.  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 Abnormal lupus anticoagulant may be influenced by Xarelto . APS increases thrombotic risk, including venous thromboembolism and potential cardiac complications. Continuing Xarelto  mitigates clotting risk, with discontinuation increasing thrombotic and myocardial infarction risk. Shared decision-making included continuing Xarelto  and introducing Celebrex  every other day for pain, with bleeding monitoring. Repeat antiphospholipid antibody tests in three months for confirmation due to elevated levels.  He was advised to continue Xarelto  20 mg daily.  - Advise staying hydrated and monitoring for stomach distress.   I discussed the assessment and treatment plan with the patient. The patient was provided an opportunity to ask questions and all were answered. The patient agreed with the plan and demonstrated an understanding of the instructions. The patient was advised to call back or seek an in-person evaluation if the symptoms worsen or if the condition fails to improve as anticipated.    I spent 12 minutes over the phone with the patient reviewing test results, discuss management and coordination/planning of care.  Marc Patten, MD 10/28/2023 6:32 PM [facility] - A DEPT OF [facility] [address] [place] [address] Dept: [phone] Dept Fax: [phone]   INTERVAL HISTORY:  Please see above for problem oriented charting.  The purpose of today's discussion is to explain recent lab  results and to formulate plan of care.  Discussed  the use of AI scribe software for clinical note transcription with the patient, who gave verbal consent to proceed.  History of Present Illness  He is being evaluated for antiphospholipid antibody syndrome, confirmed by elevated anti-cardiolipin antibody and beta 2 glycoprotein antibody levels, along with an abnormal lupus anticoagulant test. No new symptoms have emerged since his last visit, and he has not experienced any bleeding issues.  His blood counts, including white count, platelet count, and red count, are normal, though monocytes are slightly elevated, consistent with past results. Chemistries, including kidney and liver function tests, are normal. Mutation tests for prothrombin gene and factor V Leiden are negative.  He has a history of lymphoma, and his beta 2 microglobulin and LDH levels are normal. He is currently taking Xarelto , 20 mg daily, and Celebrex , 200 mg, which he finds helpful for pain management. His kidney function remains stable.  A recent D-dimer test was elevated at 1.53, with normal being 0.5, possibly related to inflammation from a recent fall. He acknowledges having inflammation from the fall.    HEMATOLOGY/ONCOLOGY HISTORY:   Mr. Mahon was feeling just fine in April of 2011 when he had an accidental fall leading to right wrist fracture.  He had been on lifelong Coumadin  for reasons discussed below, so his Coumadin  was held for a few weeks pending the need for surgery.  He had successful repair of the right wrist, however, he had a new clot in his right leg leading to IVC filter placement in May of 2011.     When he went back to Dr. Humberto as part of the physical examination in May, Dr. Humberto describes a large new abdominal mass and he obtained an ultrasound of the abdomen May 10, which showed an enlarged spleen (28 cm maximally) with anechoic central cavity felt to represent large resolving hematoma.  There was no  flow within this lesion.  Note that the patient had had a CT of the abdomen in April of 2006 for unrelated reasons, which describes "a few small sub-centimeter low-attenuation structures" in the spleen, felt to be consistent with a benign process.     On August 5th Dr. Humberto repeated an abdominal ultrasound to make sure the hematoma was resolving.  The splenic hematoma was slightly smaller, but not resolved, and so a repeat CT of the abdomen on August 24th showed the splenic size to have been essentially unchanged, and the splenic hematoma to be slightly larger (21 versus 20 cm prior). Incidentally, no adenopathy was noted associated with this.    Given the risk of further bleeding in this anticoagulated patient, and given the increase in the apparent hematoma, Dr. Lynwood Pina agreed to take the patient and proceeded to splenectomy December 29, 2009.   The postoperative course was unremarkable.     The pathology, however, showed a large cell, non-Hodgkin's lymphoma, which appeared high-grade, and was positive for CD79A, CD10, and BCL6.  It was negative for CD20.  CD3 showed some cytoplasmic positivity.  CD34, TDT and lambda and kappa were all negative, as was CD4 and CD45B. CD5 and CD8 were likewise negative.    B-cell (but CD20 negative) large cell non-Hodgkin's lymphoma, clinically confined to the spleen, with flow cytometry not suggestive of a marginal zone lymphoma (the cells being CD10 and bcl-6 positive, with some cytoplasmic CD3 positivity) followed with observation only with no evidence of disease recurrence to date.    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    Since he remained in remission, he was discharged from our office after his last visit on May 2018.  He has a history of venous thromboembolism, with two episodes of deep vein thrombosis (DVT) in the legs  and one pulmonary embolism (PE) in 2001. The first DVT occurred in his left leg a week after being stung by bees, and the second was unprovoked while waiting to travel. He has been on anticoagulation therapy since then, initially with Coumadin  and later switched to Xarelto . He currently takes Xarelto  and has a vena cava filter placed in 2001.    His PCP referred him back to us  for recommendations regarding anticoagulation.  Given IVC filter in place, which can be thrombogenic in itself, recommendation is for at least prophylactic dose of anticoagulation, if not full.  Patient is concerned about increased risk of falls as he ages and also inability to take NSAIDs (for his worsening arthritis) while on anticoagulation.    REVIEW OF SYSTEMS:    Review of Systems - Oncology  All other pertinent systems were reviewed with the patient and are negative.  I have reviewed the past medical history, past surgical history, social history and family history with the patient and they are unchanged from previous note.  ALLERGIES:  He is allergic to hydrocodone, hydrocodone-guaifenesin , hydromorphone  hcl, and oxycodone .  MEDICATIONS:  Current Outpatient Medications  Medication Sig Dispense Refill   celecoxib  (CELEBREX ) 200 MG capsule Take 1 capsule (200 mg total) by mouth every other day. 30 capsule 1   acetaminophen  (TYLENOL ) 500 MG tablet Take 1,000 mg by mouth daily.     Acetylcysteine (NAC) 500 MG CAPS Take 500 mg by mouth daily.     Ascorbic Acid (VITAMIN C) 500 MG CAPS Take 500 mg by mouth daily.     B Complex Vitamins (VITAMIN B COMPLEX) TABS Take 1 tablet by mouth daily.     cephALEXin  (KEFLEX ) 500 MG capsule Take 500 mg by mouth 3 (three) times daily. continuos     Cholecalciferol  (VITAMIN D3 ULTRA STRENGTH) 125 MCG (5000 UT) capsule Take 5,000 Units by mouth daily.     Coenzyme Q10 (COQ-10) 30 MG CAPS Take 1 capsule by mouth daily.     diclofenac Sodium (VOLTAREN) 1 % GEL 2 g daily as needed  (pain). (Patient not taking: Reported on 10/14/2023)     famotidine  (PEPCID ) 20 MG tablet Take 20 mg by mouth every evening.     Multiple Vitamin (MULTIVITAMIN ADULT) TABS Take 1 tablet by mouth daily.     Multiple Vitamins-Minerals (ONCOVITE) TABS Take 1 tablet by mouth daily.     Nutritional Supplements (SALMON OIL) CAPS Take 1 capsule by mouth daily.     oxyCODONE -acetaminophen  (PERCOCET/ROXICET) 5-325 MG tablet Take 1 tablet by mouth every 6 (six) hours as needed for severe pain (pain score 7-10). 15 tablet 0   Quercetin 50 MG TABS Take 50 mg by mouth daily.     rivaroxaban  (XARELTO ) 20 MG TABS tablet Take 20 mg by mouth daily.     sildenafil  (REVATIO ) 20 MG tablet Take 20 mg by mouth daily as needed (erectile dysfunction).     vitamin E 180 MG (400 UNITS) capsule Take 400 Units by mouth daily.     No current facility-administered medications for this visit.    PHYSICAL EXAMINATION:   Onc Performance Status - 10/28/23 1800       ECOG Perf Status   ECOG Perf  Status Fully active, able to carry on all pre-disease performance without restriction      KPS SCALE   KPS % SCORE Able to carry on normal activity, minor s/s of disease          LABORATORY DATA:   I have reviewed the data as listed.  Recent Results (from the past 2160 hours)  CBC with Differential (Cancer Center Only)     Status: Abnormal   Collection Time: 10/14/23 10:25 AM  Result Value Ref Range   WBC Count 7.3 4.0 - 10.5 K/uL   RBC 4.99 4.22 - 5.81 MIL/uL   Hemoglobin 15.0 13.0 - 17.0 g/dL   HCT 55.6 60.9 - 47.9 %   MCV 88.8 80.0 - 100.0 fL   MCH 30.1 26.0 - 34.0 pg   MCHC 33.9 30.0 - 36.0 g/dL   RDW 83.4 (H) 88.4 - 84.4 %   Platelet Count 270 150 - 400 K/uL   nRBC 0.0 0.0 - 0.2 %   Neutrophils Relative % 64 %   Neutro Abs 4.7 1.7 - 7.7 K/uL   Lymphocytes Relative 9 %   Lymphs Abs 0.6 (L) 0.7 - 4.0 K/uL   Monocytes Relative 20 %   Monocytes Absolute 1.4 (H) 0.1 - 1.0 K/uL   Eosinophils Relative 6 %    Eosinophils Absolute 0.4 0.0 - 0.5 K/uL   Basophils Relative 1 %   Basophils Absolute 0.1 0.0 - 0.1 K/uL   Immature Granulocytes 0 %   Abs Immature Granulocytes 0.03 0.00 - 0.07 K/uL    Comment: Performed at [facility], [address], [place], [place] [address]  CMP (Cancer Center only)     Status: Abnormal   Collection Time: 10/14/23 10:25 AM  Result Value Ref Range   Sodium 139 135 - 145 mmol/L   Potassium 4.5 3.5 - 5.1 mmol/L   Chloride 103 98 - 111 mmol/L   CO2 32 22 - 32 mmol/L   Glucose, Bld 100 (H) 70 - 99 mg/dL    Comment: Glucose reference range applies only to samples taken after fasting for at least 8 hours.   BUN 18 8 - 23 mg/dL   Creatinine 9.24 9.38 - 1.24 mg/dL   Calcium  9.5 8.9 - 10.3 mg/dL   Total Protein 7.1 6.5 - 8.1 g/dL   Albumin 4.3 3.5 - 5.0 g/dL   AST 18 15 - 41 U/L   ALT 17 0 - 44 U/L   Alkaline Phosphatase 62 38 - 126 U/L   Total Bilirubin 0.5 0.0 - 1.2 mg/dL   GFR, Estimated >39 >39 mL/min    Comment: (NOTE) Calculated using the CKD-EPI Creatinine Equation (2021)    Anion gap 4 (L) 5 - 15    Comment: Performed at [facility], [address] [place], [place] [address]  D-dimer, quantitative     Status: Abnormal   Collection Time: 10/14/23 10:25 AM  Result Value Ref Range   D-Dimer, Quant 1.53 (H) 0.00 - 0.50 ug/mL-FEU    Comment: (NOTE) At the manufacturer cut-off value of 0.5 g/mL FEU, this assay has a negative predictive value of 95-100%.This assay is intended for use in conjunction with a clinical pretest probability (PTP) assessment model to exclude pulmonary embolism (PE) and deep venous thrombosis (DVT) in outpatients suspected of PE or DVT. Results should be correlated with clinical presentation. Performed at [facility], [address], [place], [place] [address]   Prothrombin gene mutation  Status: None   Collection Time: 10/14/23 10:25 AM  Result Value Ref  Range   Recommendations-PTGENE: Comment     Comment: (NOTE) Result: c.*97G>A - Not Detected This result is not associated with an increased risk for venous thromboembolism. See Additional Clinical Information and Comments. Additional Clinical Information: Venous thromboembolism is a multifactorial disease influenced by genetic, environmental, and circumstantial risk factors. The c.*97G>A variant in the F2 gene is a genetic risk factor for venous thromboembolism. Heterozygous carriers have a 2- to 4-fold increased risk for venous thromboembolism. Homozygotes for the c.*97G>A variant are rare. The annual risk of VTE in homozygotes has been reported to be 1.1%/year. Individuals who carry both a c.*97G>A variant in the F2 gene and a c.1601G>A (p. Arg534Gln) variant in the F5 gene (commonly referred to as Factor V Leiden) have an approximately 20- fold increased risk for venous thromboembolism. Risks are likely to be even higher in more complex genotype combinations involving the F2 c.*97G>A variant and Factor V Leiden (PMID:  66325232). Additional risk factors include but are not limited to: deficiency of protein C, protein S, or antithrombin III, age, male sex, personal or family history of deep vein thromboembolism, smoking, surgery, prolonged immobilization, malignant neoplasm, tamoxifen treatment, raloxifene treatment, oral contraceptive use, hormone replacement therapy, and pregnancy. Management of thrombotic risk and thrombotic events should follow established guidelines and fit the clinical circumstance. This result cannot predict the occurrence or recurrence of a thrombotic event. Comments: Genetic counseling is recommended to discuss the potential clinical implications of positive results, as well as recommendations for testing family members. Genetic Coordinators are available for health care providers to discuss results at [phone]). Test Details: Variant  analyzed: c.*97G>A, previously referred to as G20210A Methods/Limitations: DNA analysis of the F2 gene (NM_000 506.5) was performed by PCR amplification followed by restriction enzyme analysis. The diagnostic sensitivity is >99%. Results must be combined with clinical information for the most accurate interpretation. Molecular-based testing is highly accurate, but as in any laboratory test, diagnostic errors may occur. False positive or false negative results may occur for reasons that include genetic variants, blood transfusions, bone marrow transplantation, somatic or tissue-specific mosaicism, mislabeled samples, or erroneous representation of family relationships. This test was developed and its performance characteristics determined by [facility]. It has not been cleared or approved by the Food and Drug Administration. References: Bhatt S, Taylor AK, Lozano R, Grody [facility], Signa [facility]; ACMG Professional Practice and Guidelines Committee. Addendum: [employer] of [employer] Genetics consensus statement on factor V Leiden mutation testing. Genet Med. 2021 Mar 5. doi: 89.8961/d585 [id number]-x. PMID: 66325232. Hosey RUSH. Prothrombin Thrombophilia. 2006 Jul 25 [Updated 2021 Feb 4]. In: Juliene POSNER, Ardinger HH, Pagon RA, et al., editors. GeneReviews(R) [Internet]. [address] [facility]): [place] , [place]; 8006-7978. Available from: [email or web address].[email or web address]/ Laurita GORMAN Waddell BRIDGETT, Huang X, Luo B, Spector EB, Ileana SHAUNNA Gal CS; ACMG Laboratory Quality Assurance Committee. Venous thromboembolism laboratory testing (factor V Leiden and factor II c.*97G>A), 2018 update: a technical standard of the [employer] of [employer] and [employer]omics (ACMG). Genet Med. 2018 Dec;20(12):1489-1498. doi: 10.1038/[id number]-z. Epub 2018 Oct 5. PMID: 69702301.    Reviewed by: Comment     Comment: (NOTE) Technical Component performed at [employer] [facility] Professional Component  performed by: Lelon Dull, PhD, [facility] BPTGD4, [facility], [address] [place] [address] Performed At: [facility] [address] [place], [place] [id number] Loran Gales MDPhD Ey:[phone]   Factor 5 leiden     Status: None   Collection Time:  10/14/23 10:25 AM  Result Value Ref Range   Recommendations-F5LEID: Comment     Comment: (NOTE) Result: c.1601G>A (p.Arg534Gln) - Not Detected This result is not associated with an increased risk for venous thromboembolism. See Additional Clinical Information and Comments. Additional Clinical Information:    Venous thromboembolism is a multifactorial disease influenced by genetic, environmental, and circumstantial risk factors. The c.1601G>A (p. Arg534Gln) variant in the F5 gene, commonly referred to as Factor V Leiden, is a genetic risk factor for venous thromboembolism. Heterozygous carriers of this variant have a 6- to 8- fold increased risk for venous thromboembolism. Individuals homozygous for this variant (ie, with a copy of the variant on each chromosome) have an approximately 80-fold increased risk for venous thromboembolism. Individuals who carry both a c.*97G>A variant in the F2 gene and Factor V Leiden have an approximately 20-fold increased risk for venous thromboembolism. Risks are likely to be even higher in more complex genotype combinations  involving the F2 c.*97G>A variant and Factor V Leiden (PMID: 66325232). Additional risk factors include but are not limited to: deficiency of protein C, protein S, or antithrombin III, age, male sex, personal or family history of deep vein thromboembolism, smoking, surgery, prolonged immobilization, malignant neoplasm, tamoxifen treatment, raloxifene treatment, oral contraceptive use, hormone replacement therapy, and pregnancy. Management of thrombotic risk and thrombotic events should follow established guidelines and fit the clinical circumstance. This result cannot predict the  occurrence or recurrence of a thrombotic event. Comment:    Genetic counseling is recommended to discuss the potential clinical implications of positive results, as well as recommendations for testing family members.    Genetic Coordinators are available for health care providers to discuss results at [phone]). Test Details:    Variant Analyzed: c.1601G>A (p. Arg534Gln), referre d to as Factor V Leiden Methods/Limitations:    DNA analysis of the F5 gene (NM_000130.5) was performed by PCR amplification followed by electrophoresis. The diagnostic sensitivity is >99%. Results must be combined with clinical information for the most accurate interpretation. Molecular-based testing is highly accurate, but as in any laboratory test, diagnostic errors may occur. False positive or false negative results may occur for reasons that include genetic variants, blood transfusions, bone marrow transplantation, somatic or tissue-specific mosaicism, mislabeled samples, or erroneous representation of family relationships.    This test was developed and its performance characteristics determined by [facility]. It has not been cleared or approved by the Food and Drug Administration. References:    Bhatt S, Taylor AK, Lozano R, Grody [facility], Signa [facility]; ACMG Professional Practice and Guidelines Committee. Addendum: [employer] of Medical Genetics consensus statemen t on factor V Leiden mutation testing. Genet Med. 2021 Mar 5. doi: 89.8961/d58563-978- 01108-x. PMID: 66325232.    Hosey RUSH. Factor V Leiden Thrombophilia. 1999 May 14 (Updated 2018 Jan 4). In: Juliene POSNER, Ardinger HH, Pagon RA, et al., editors. GeneReviews(R) (Internet). [address] ([place]): [place] of [place] , [place]; 8006-7978. Available from: [email or web address]-mcgee.org/    Laurita GORMAN Waddell BRIDGETT, Huang X, Luo B, Spector EB, Ileana SHAUNNA Gal CS; ACMG Laboratory Quality Assurance Committee. Venous thromboembolism  laboratory testing (factor V Leiden and factor II c. *97G>A), 2018 update: a technical standard of the [employer] of [employer] and [employer] (ACMG). Genet Med. 2018 Izr;79(87): 8510-8501. doi: 10.1038/[id number]-z. Epub 2018 Oct 5. PMID: 69702301.    Reviewed By: Comment     Comment: (NOTE) Technical Component performed at [employer] [facility] Professional Component performed by: Lelon Dull, PhD, [facility] BPTGD4, [facility], [address] [place] [address] Performed  At: [facility] [address] [place], [place] [id number] Chenn Anjen MDPhD Ey:[phone]   Beta-2 -glycoprotein i abs, IgG/M/A     Status: Abnormal   Collection Time: 10/14/23 10:25 AM  Result Value Ref Range   Beta-2  Glyco I IgG 17 0 - 20 GPI IgG units    Comment: (NOTE) The reference interval reflects a 3SD or 99th percentile interval, which is thought to represent a potentially clinically significant result in accordance with the International Consensus Statement on the classification criteria for definitive antiphospholipid syndrome (APS). J Thromb Haem 2006;4:295-306.    Beta-2 -Glycoprotein I IgM 101 (H) 0 - 32 GPI IgM units    Comment: (NOTE) The reference interval reflects a 3SD or 99th percentile interval, which is thought to represent a potentially clinically significant result in accordance with the International Consensus Statement on the classification criteria for definitive antiphospholipid syndrome (APS). J Thromb Haem 2006;4:295-306. Performed At: [facility] [address] [place], [place] [id number] Jennette Shorter MD Ey:[phone]    Beta-2 -Glycoprotein I IgA <9 0 - 25 GPI IgA units    Comment: (NOTE) The reference interval reflects a 3SD or 99th percentile interval, which is thought to represent a potentially clinically significant result in accordance with the International Consensus Statement on the classification criteria for definitive antiphospholipid syndrome (APS).  J Thromb Haem 2006;4:295-306.   Cardiolipin antibodies, IgG, IgM, IgA     Status: Abnormal   Collection Time: 10/14/23 10:25 AM  Result Value Ref Range   Anticardiolipin IgG 28 (H) 0 - 14 GPL U/mL    Comment: (NOTE)                          Negative:              <15                          Indeterminate:     15 - 20                          Low-Med Positive: >20 - 80                          High Positive:         >80    Anticardiolipin IgM 72 (H) 0 - 12 MPL U/mL    Comment: (NOTE)                          Negative:              <13                          Indeterminate:     13 - 20                          Low-Med Positive: >20 - 80                          High Positive:         >80    Anticardiolipin IgA <9 0 - 11 APL U/mL    Comment: (NOTE)  Negative:              <12                          Indeterminate:     12 - 20                          Low-Med Positive: >20 - 80                          High Positive:         >80 Performed At: [facility] [address] [place], [place] [id number] Jennette Shorter MD Ey:[id number]   Lupus anticoagulant panel     Status: Abnormal   Collection Time: 10/14/23 10:25 AM  Result Value Ref Range   PTT Lupus Anticoagulant 111.9 (H) 0.0 - 43.5 sec   DRVVT >180.0 (H) 0.0 - 47.0 sec   Lupus Anticoag Interp Comment (A)     Comment: (NOTE) Results are consistent with the presence of a lupus anticoagulant. NOTE: Only persistent lupus anticoagulants are thought to be of clinical significance. For this reason, repeat testing in 12 or more weeks after an initial positive result should be considered to confirm or refute the presence of a lupus anticoagulant, depending on clinical presentation. Results of lupus anticoagulant tests may be falsely positive in the presence of certain anticoagulant therapies. Performed At: [facility] [address] [place], [place] [id number] Jennette Shorter MD  Ey:[id number]   Protein C activity     Status: None   Collection Time: 10/14/23 10:25 AM  Result Value Ref Range   Protein C Activity 109 73 - 180 %    Comment: (NOTE) Performed At: [facility] [address] [place], [place] [id number] Jennette Shorter MD Ey:[id number]   PROTEIN S PANEL     Status: None   Collection Time: 10/14/23 10:25 AM  Result Value Ref Range   Protein S Ag, Total 68 60 - 150 %    Comment: (NOTE) This test was developed and its performance characteristics determined by [facility]. It has not been cleared or approved by the Food and Drug Administration.    Protein S Ag, Free 108 61 - 136 %   Protein S Activity 112 63 - 140 %    Comment: (NOTE) Protein S activity may be falsely increased (masking an abnormal, low result) in patients receiving direct Xa inhibitor (e.g., rivaroxaban , apixaban, edoxaban) or a direct thrombin  inhibitor (e.g., dabigatran) anticoagulant treatment due to assay interference by these drugs. Performed At: [facility] [address] [place], [place] [id number] Jennette Shorter MD Ey:[id number]   Antithrombin panel     Status: None   Collection Time: 10/14/23 10:25 AM  Result Value Ref Range   Antithrombin Activity 124 75 - 135 %    Comment: (NOTE) Direct Xa inhibitor anticoagulants such as rivaroxaban , apixaban and edoxaban will lead to spuriously elevated antithrombin activity levels possibly masking a deficiency.    AT III AG PPP IMM-ACNC 101 72 - 124 %    Comment: (NOTE) This test was developed and its performance characteristics determined by [facility]. It has not been cleared or approved by the Food and Drug Administration. Performed At: [facility] [address] [place], [place] [id number] Jennette Shorter MD Ey:[id number]   Lactate dehydrogenase     Status: None   Collection Time: 10/14/23 10:25 AM  Result Value Ref Range   LDH 131 98 - 192 U/L    Comment: Performed at [facility]  Laboratory, [address] [place], [place] [address]  Beta 2 microglobulin, serum     Status: None   Collection Time: 10/14/23 10:25 AM  Result Value Ref Range   Beta-2  Microglobulin 1.7 0.6 - 2.4 mg/L    Comment: (NOTE) Siemens Immulite 2000 Immunochemiluminometric assay (ICMA) Values obtained with different assay methods or kits cannot be used interchangeably. Results cannot be interpreted as absolute evidence of the presence or absence of malignant disease. Performed At: [facility] [address] [place], [place] [id number] Jennette Shorter MD Ey:[id number]   PTT-LA Mix     Status: Abnormal   Collection Time: 10/14/23 10:25 AM  Result Value Ref Range   PTT-LA Mix 86.2 (H) 0.0 - 40.5 sec    Comment: (NOTE) Performed At: [facility] [address] [place], [place] [id number] Jennette Shorter MD Ey:[id number]   Hexagonal Phase Phospholipid     Status: Abnormal   Collection Time: 10/14/23 10:25 AM  Result Value Ref Range   Hexagonal Phase Phospholipid 44 (H) 0 - 11 sec    Comment: (NOTE) Performed At: [facility] [address] [place], [place] [id number] Jennette Shorter MD Ey:[id number]   dRVVT Mix     Status: Abnormal   Collection Time: 10/14/23 10:25 AM  Result Value Ref Range   dRVVT Mix 146.8 (H) 0.0 - 40.4 sec    Comment: (NOTE) Performed At: [facility] [address] [place], [place] [id number] Jennette Shorter MD Ey:[id number]   dRVVT Confirm     Status: Abnormal   Collection Time: 10/14/23 10:25 AM  Result Value Ref Range   dRVVT Confirm >2.3 (H) 0.8 - 1.2 ratio    Comment: (NOTE) Performed At: [facility] [address] [place], [place] [id number] Jennette Shorter MD Ey:[id number]      RADIOGRAPHIC STUDIES:  No recent pertinent imaging studies available to review.  Orders Placed This Encounter  Procedures   CBC with Differential (Cancer Center Only)    Standing Status:   Future    Expected Date:   01/13/2024     Expiration Date:   04/12/2024   CMP (Cancer Center only)    Standing Status:   Future    Expected Date:   01/13/2024    Expiration Date:   04/12/2024   Beta-2 -glycoprotein i abs, IgG/M/A    Standing Status:   Future    Expected Date:   01/13/2024    Expiration Date:   04/12/2024   Cardiolipin antibodies, IgG, IgM, IgA    Standing Status:   Future    Expected Date:   01/13/2024    Expiration Date:   04/12/2024   D-dimer, quantitative    Standing Status:   Future    Expected Date:   01/13/2024    Expiration Date:   04/12/2024   Lactate dehydrogenase    Standing Status:   Future    Expected Date:   01/13/2024    Expiration Date:   04/12/2024     Future Appointments  Date Time Provider Department Center  12/01/2023  8:30 AM Burnetta Brunet, DO [facility] None  01/13/2024  8:15 AM [facility] None  01/13/2024  8:45 AM Mikaele Stecher, [place], MD [facility] None    This document was completed utilizing speech recognition software. Grammatical errors, random word insertions, pronoun errors, and incomplete sentences are an occasional consequence of this system due to  software limitations, ambient noise, and hardware issues. Any formal questions or concerns about the content, text or information contained within the body of this dictation should be directly addressed to the provider for clarification.

## 2023-10-28 NOTE — Assessment & Plan Note (Signed)
 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 Currently on Xarelto . Concerns about bleeding risk with anticoagulation, especially with increased fall risk due to age. Discussion about reducing Xarelto  dose to prophylactic dosing to balance clot prevention and bleeding risk. Consideration of NSAIDs for arthritis pain management with reduced Xarelto  dose. IVC filter placed in 2001, likely with scar tissue formation, making removal risky. IVC filter increases risk of downstream clots, necessitating indefinite anticoagulation.  On his consultation with us  on 10/14/2023, we pursued thrombophilia workup.  Prothrombin gene mutation, factor V Leiden mutation were negative.  Protein C activity, protein S activity, Antithrombin III activity were all within normal limits.  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 Abnormal lupus anticoagulant may be influenced by Xarelto . APS increases thrombotic risk, including venous thromboembolism and potential cardiac complications. Continuing Xarelto  mitigates clotting risk, with discontinuation increasing thrombotic and myocardial infarction risk. Shared decision-making included continuing Xarelto  and introducing Celebrex  every other day for pain, with bleeding monitoring. Repeat antiphospholipid antibody tests in three months for confirmation due to elevated levels.  He was advised to continue Xarelto  20 mg daily.  - Advise staying hydrated and monitoring for stomach distress.

## 2023-12-01 ENCOUNTER — Ambulatory Visit: Admitting: Sports Medicine

## 2023-12-03 ENCOUNTER — Ambulatory Visit (INDEPENDENT_AMBULATORY_CARE_PROVIDER_SITE_OTHER): Admitting: Sports Medicine

## 2023-12-03 ENCOUNTER — Other Ambulatory Visit (INDEPENDENT_AMBULATORY_CARE_PROVIDER_SITE_OTHER): Payer: Self-pay

## 2023-12-03 ENCOUNTER — Encounter: Payer: Self-pay | Admitting: Sports Medicine

## 2023-12-03 DIAGNOSIS — M75101 Unspecified rotator cuff tear or rupture of right shoulder, not specified as traumatic: Secondary | ICD-10-CM

## 2023-12-03 DIAGNOSIS — M79672 Pain in left foot: Secondary | ICD-10-CM

## 2023-12-03 DIAGNOSIS — Z89412 Acquired absence of left great toe: Secondary | ICD-10-CM

## 2023-12-03 DIAGNOSIS — S98112A Complete traumatic amputation of left great toe, initial encounter: Secondary | ICD-10-CM

## 2023-12-03 DIAGNOSIS — M25511 Pain in right shoulder: Secondary | ICD-10-CM

## 2023-12-03 DIAGNOSIS — G8929 Other chronic pain: Secondary | ICD-10-CM

## 2023-12-03 NOTE — Progress Notes (Signed)
 Marc Mills - 78 y.o. male MRN [id number]  Date of birth: 06/02/1945  Office Visit Note: Visit Date: 12/03/2023 PCP: [name], MD Referred by: [name], MD  Subjective: Chief Complaint  Patient presents with   Right Shoulder - Follow-up   HPI: Marc Mills is a very pleasant 78 y.o. male who presents today for follow-up of chronic right shoulder pain as well as left-sided midfoot/lateral foot pain.  Right shoulder -overall the shoulder is doing quite well.  Back in May we did proceed with ultrasound-guided subacromial joint injection near his overlying supraspinatus tear, this was quite helpful for him already.  He has been able to swim daily and does alternate every other day with freestyle and overhead motion, which he was unable to do before.  He did talk with his hematologist and they are okay with him taking Celebrex  100 mg every other day, he also has find improvement with this.  Left foot -he has underwent ray amputation at the MTP joint of the first great toe.  Since this, he has had pressure that shifted to the lateral midfoot and has had pain associated with this here.  He is interested sometime in the future about considering injection where we see fit.  Pertinent ROS were reviewed with the patient and found to be negative unless otherwise specified above in HPI.   Assessment & Plan: Visit Diagnoses:  1. Chronic right shoulder pain   2. Tear of right supraspinatus tendon   3. Pain in left foot   4. Amputation of left great toe (HCC)    Plan: Impression is chronic right shoulder pain which has made great improvement after previous ultrasound-guided injection, home therapy and his swimming exercise.  At this point he will continue swimming and modifying his stroke as needed, continue HEP on a consistent basis for shoulder stability and rotator cuff strengthening.  In terms of his left foot pain, this is likely compensatory from his previous great  toe amputation, as he does have degree of transfer metatarsalgia and pain over the midfoot on the lateral column.  X-rays were obtained today.  Could consider injection in the future.  He will continue his Celebrex  100 mg every other day as needed for the above pains.  He will see me back in about 3 weeks to recheck on the shoulder, likely consider injection at that time if needed, and f/u on foot xr's.  Follow-up: Return in about 3 weeks  Meds & Orders: No orders of the defined types were placed in this encounter.   Orders Placed This Encounter  Procedures   XR Foot Complete Left    Procedures: No procedures performed      Clinical History: No specialty comments available.  He reports that he quit smoking about 39 years ago. His smoking use included cigarettes. He started smoking about 59 years ago. He has a 40 pack-year smoking history. He has never used smokeless tobacco. No results for input(s): HGBA1C, LABURIC in the last 8760 hours.  Objective:    Physical Exam  Gen: Well-appearing, in no acute distress; non-toxic CV: Well-perfused. Warm.  Resp: Breathing unlabored on room air; no wheezing. Psych: Fluid speech in conversation; appropriate affect; normal thought process  Ortho Exam - Left foot: Status post ray amputation of the first great toe at the level of the MTP joint.  There is a degree of transfer metatarsalgia.  Pain in the midfoot on the lateral aspect near the cuneiform/cuboid juncture.  Imaging: XR  Foot Complete Left Result Date: 12/03/2023 3 views of the left foot including AP, oblique and lateral film was ordered and reviewed by myself today.  X-rays demonstrate previous ray amputation of the distal great toe IP joint.  There is at least moderate midfoot arthritic change most notable at the talonavicular and navicular cuboid juncture with anterior spurring.  There is reciprocal medial translation of the toes 2-5 likely from compensation from his previous great toe  resection.  No acute fracture noted.   Past Medical/Family/Surgical/Social History: Medications & Allergies reviewed per EMR, new medications updated. Patient Active Problem List   Diagnosis Date Noted   Trochanteric bursitis, left hip 06/19/2023   Arthritis of right ankle 02/04/2023   Post-traumatic osteoarthritis, right ankle and foot 02/18/2022   Toe pain, right 02/18/2022   Essential hypertension 08/28/2021   Low back pain 06/27/2021   Acute embolism and thrombosis of unspecified deep veins of unspecified lower extremity (HCC) 06/03/2021   Anal fistula 06/03/2021   Anxiety 06/03/2021   Erectile dysfunction 06/03/2021   Essential tremor 06/03/2021   History of lymphoma 06/03/2021   Impacted cerumen 06/03/2021   Peripheral neuropathy 06/03/2021   History of colonic polyps 06/03/2021   Rectal fistula 06/03/2021   Inflamed seborrheic keratosis 06/03/2021   Vitamin D  deficiency 06/03/2021   Tremor 06/03/2021   Eustachian tube dysfunction, left 12/10/2020   Otalgia, left 12/10/2020   Nondisplaced fracture of distal phalanx of left middle finger, initial encounter for open fracture 09/18/2020   Pain in left hip 03/27/2020   Laceration of right ring finger 05/05/2019   Laceration of flexor muscle, fascia and tendon of right ring finger at wrist and hand level, initial encounter 04/23/2019   Unspecified fracture of the lower end of right radius, initial encounter for closed fracture 04/23/2019   Fracture of unspecified phalanx of right ring finger, initial encounter for open fracture 04/23/2019   Left knee pain 06/14/2018   Pain in left ankle and joints of left foot 12/18/2017   Bilateral edema of lower extremity 10/30/2015   Onychomycosis 10/30/2015   History of pulmonary embolism    History of DVT (deep vein thrombosis)    Pyrexia    Cellulitis 03/18/2015   Chronic anticoagulation 03/17/2015   Morbid obesity with BMI of 45.0-49.9, adult (HCC) 03/17/2015   S/P IVC filter  03/17/2015   Genetic testing 01/12/2015   Postoperative wound infection 12/09/2013   Postop check 11/24/2013   Ulcer of lower limb (HCC) 08/24/2013   Inguinal lymphadenopathy 08/16/2013   Encounter for therapeutic drug monitoring 06/09/2013   Left leg cellulitis 04/04/2013   Osteoarthritis of left knee 11/02/2012   Cellulitis of right lower extremity 04/30/2012   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  04/29/2012   S/P splenectomy 04/29/2012   Incisional hernia 05/27/2011   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   Morbid obesity (HCC) 04/19/2008   Obstructive sleep apnea 04/19/2008   VARICOSE VEINS, LOWER EXTREMITIES 04/19/2008   PULMONARY EMBOLISM, HX OF 04/19/2008   Past Medical History:  Diagnosis Date   Anorectal fistula    Arthritis    Blood transfusion without reported diagnosis    Cancer (HCC)    lymphoma hx   Cataract    Cellulitis 04/04/2013   DVT (deep venous thrombosis) (HCC)    2011 right leg   History of lymphoma    History of pulmonary embolus (PE)    Incisional hernia    Morbid obesity (HCC)    OSA on CPAP  cpap  10 yrs   Sepsis (HCC) 08/31/2015   Varicose veins of lower extremities    pe   Family History  Problem Relation Age of Onset   Heart disease Father    Varicose Veins Father    Heart attack Father        dx. 3s   Heart disease Mother 20   Varicose Veins Mother    Pancreatic cancer Sister 24   Cancer Maternal Grandmother        either stomach or pancreatic primary    Pancreatic cancer Maternal Grandfather        dx. 50s   Lung cancer Maternal Aunt        secondhand smoke exposure   Heart attack Paternal Uncle    Rectal cancer Maternal Aunt        dx. 80s   Cancer Maternal Aunt        unknown type   Diabetes Paternal Uncle    Cancer Cousin        unknown type   Breast cancer Cousin        dx. 87s   Past Surgical History:  Procedure Laterality Date   EYE SURGERY Left 03   cat, detached ret     FRACTURE SURGERY      HERNIA REPAIR     INGUINAL LYMPH NODE BIOPSY [name] 11/15/2013   Procedure: RIGHT INGUINAL LYMPH NODE BIOPSY;  Surgeon: Lynwood MALVA Pina, MD;  Location: [facility] OR;  Service: General;  Laterality: [name];   ivc filter     PROSTATE SURGERY     (patient denies)   SPLENECTOMY     TONSILLECTOMY     age 78   TOTAL KNEE ARTHROPLASTY Left 11/02/2012   Procedure: TOTAL KNEE ARTHROPLASTY with revision tibia;  Surgeon: Maude LELON Right, MD;  Location: [facility] OR;  Service: Orthopedics;  Laterality: Left;   Social History   Occupational History   Not on file  Tobacco Use   Smoking status: Former    Current packs/day: 0.00    Average packs/day: 2.0 packs/day for 20.0 years (40.0 ttl pk-yrs)    Types: Cigarettes    Start date: 05/05/1964    Quit date: 05/05/1984    Years since quitting: 39.6   Smokeless tobacco: Never  Vaping Use   Vaping status: Never Used  Substance and Sexual Activity   Alcohol use: Yes    Alcohol/week: 0.0 standard drinks of alcohol    Comment: maybe 1 beer/month   Drug use: No   Sexual activity: Yes    Birth control/protection: None

## 2023-12-03 NOTE — Progress Notes (Signed)
 Patient says that his shoulder is doing overall much better. He is able to swim about 0.25 miles while using the arm with full motion. He spoke with his hematologist and has been able to take Celebrex  once every other day, and has noticeable improvement on the days that he does take it. He says that he would be interested in repeat injection today, if recommended.

## 2023-12-23 ENCOUNTER — Ambulatory Visit (INDEPENDENT_AMBULATORY_CARE_PROVIDER_SITE_OTHER): Admitting: Sports Medicine

## 2023-12-23 ENCOUNTER — Other Ambulatory Visit: Payer: Self-pay

## 2023-12-23 ENCOUNTER — Encounter: Payer: Self-pay | Admitting: Sports Medicine

## 2023-12-23 DIAGNOSIS — M25511 Pain in right shoulder: Secondary | ICD-10-CM | POA: Diagnosis not present

## 2023-12-23 DIAGNOSIS — G8929 Other chronic pain: Secondary | ICD-10-CM | POA: Diagnosis not present

## 2023-12-23 DIAGNOSIS — M75101 Unspecified rotator cuff tear or rupture of right shoulder, not specified as traumatic: Secondary | ICD-10-CM | POA: Diagnosis not present

## 2023-12-23 DIAGNOSIS — M19072 Primary osteoarthritis, left ankle and foot: Secondary | ICD-10-CM | POA: Diagnosis not present

## 2023-12-23 DIAGNOSIS — M2021 Hallux rigidus, right foot: Secondary | ICD-10-CM | POA: Diagnosis not present

## 2023-12-23 NOTE — Progress Notes (Addendum)
 SUBJECTIVE:    Medical Resident, Sports Medicine Fellow - Attending Physician Addendum:   I have independently interviewed and examined the patient myself. I have discussed the above with the original author and agree with their documentation. My edits for correction/addition/clarification have been made, see any changes above and below.   In summary, very pleasant 78 year old male who presents with follow-up of chronic right shoulder pain with known partial-thickness supraspinatus tear as well as left midfoot arthritic change and pain status post great toe amputation with transfer metatarsalgia.  Also with right toe OA/hallux rigidus.  Through shared decision making, did proceed with ultrasound-guided left subacromial joint injection, patient tolerated well.  He will continue his swimming and his home exercises with activity modification, but he is making good strides with this combination.  In terms of his midfoot arthritic change, discussed importance of good shoe wear, we could consider an injection in this location for pain control if needed.  He is finding benefit using his Celebrex  100 mg every other day which he has cleared with his medical provider.  In terms of his right great toe hallux rigidus.  Surgery has been discussed with podiatry, but he is holding off on this currently.  We will plan on considering blue EVA padding to help with great toe spurring and pushoff in his insole orthotic at a later date.  Lonell Sprang, DO Primary Care Sports Medicine Physician  [facility]   CHIEF COMPLAINT / HPI:   Patient presents for follow up of partial thickness supraspinatus tear with chronic left shoulder pain.  He has been doing well, swimming 1/4-1/2 mile every other day, and has been feeling even better on days when he is able to take celebrex . He does desire a subacromial injection today to further decrease his pain, as he would like to be able to swim 1/2 mile  every time he goes.  Left foot - status post great toe amputation from IP joint. Having midfoot arthritis, would like to consider injection here in future.  Right great toe - saw podiatry, considering surgical release for hallux rigidus.  PERTINENT  PMH / PSH: VTE on chronic coumadin , HTN, partial tear of supraspinatus  OBJECTIVE:   General: Well appearing, no distress MSK: Right shoulder is symmetrical with left, no gapping or acromial prominence. Limited AROM in abduction, but negative drop arm test, demonstrating 5/5 strength in all other planes of motion. Minimal TTP over the supraspinatous tendon. No pain or weakness with resisted internal or external rotation. Negative empty can test.   US  R Shoulder limited:  Calcifications present in supraspinatous tendon, with increased lucency near the foot plate. Improved organization of surrounding tendon. No bursal enlargement noted.  ASSESSMENT/PLAN:   *Procedure:  US -Guided Subacromial Injection, Right Shoulder  After discussion on risks/benefits/indications, informed verbal consent was obtained. A timeout was then performed. Patient was seated on table in exam room. The patient's shoulder was prepped with chloraprep and alcohol swabs and utilizing lateral approach with ultrasound guidance, the patient's subacromial space was injected with 2:2:1 mixture of lidocaine :bupivicaine:depomedrol via an in-plane approach. Brief needle tenotomy applied to supraspinatus tendon in addition. Patient tolerated the procedure well without immediate complications.    Assessment & Plan Chronic right shoulder pain -US  guided corticosteroid injection of the subacromial bursa with pre-numbing with lidocaine  -tenotomy intraprocedurally -follow up as needed - continue celebrex  100mg  every other day Tear of right supraspinatus tendon - continue HEP, swimming, activity modification Arthritis of left midfoot -  consider orthotics - may consider US -guided  injection in future - celebrex  100mg  every other day PRN Hallux rigidus, right foot - considering surgery with podiatry, would hold off for now - at follow-up will try blue EVA padding for GT spring/assist   Lucie Pinal, DO [facility]

## 2023-12-23 NOTE — Progress Notes (Signed)
 Patient says he is doing well. He has gotten into a good routine of swimming every other day, and walking in the pools on the days that he does not swim. He would like to repeat the injection today to see if it will give him complete long-term relief.

## 2023-12-28 NOTE — Progress Notes (Signed)
 [facility] Subjective:  Patient ID: Marc Mills is a 78 y.o. (DOB Jul 19, 1945) male      Patient presents with  . Follow-up    Patient returns today to follow up on right hallux ulcer, he is doing anasept  daily       History of Present Illness  The patient is a 78 year old male who presents today for follow-up of a toe ulcer.  Toe Ulcer He reports that his toe ulcer is in good condition, with no signs of drainage. He recalls noticing blood spots from the ulcer, which led him to seek medical attention. He has an upcoming appointment scheduled in 1 to 2 weeks but plans to cancel it due to a conflict with his wife's knee surgery on 02/09/2024. He mentions that his recovery process tends to be slower than average, which he attributes to his use of blood thinners. He also notes that he wears compression stockings daily and prefers to wear shoes at home, as he has not found a suitable pair of slippers. He has tried orthopedic shoes in the past, but they only provided relief for a few weeks before becoming uncomfortable. He has an appointment scheduled for 01/22/2024 to get new insoles, as his current ones are 78 years old. - Onset: Noticed blood spots from the ulcer. - Location: Toe. - Character: No signs of drainage, blood spots. - Alleviating/Aggravating Factors: Slower recovery process due to blood thinners; compression stockings and shoes at home. - Timing: Upcoming appointment scheduled in 1 to 2 weeks; plans to cancel due to wife's knee surgery on 02/09/2024. - Severity: Good condition, no signs of drainage.   Reviewed and updated this visit by provider: Tobacco  Allergies  Meds  Problems  Med Hx  Surg Hx  Fam Hx       Objective:   Vitals:   12/25/23 0849  PainSc: 0-No pain  PainLoc: Foot   Physical Exam   Physical Exam  General Appearance: Normal. HEENT: Within normal limits.  LOWER EXTREMITY: Vascular: Pedal pulses are palpable, normal  capillary refill of the toes, no signs of ischemia, no edema. Skin: A hard callus is present on the skin of the foot. Musculoskeletal: 5/5 manual muscle testing to the ankle DF / PF / INV / EV Calf supple non tender. Neurological: Sensation abnormal.    Results     Assessment / Plan:  Assessment 1. Toe ulcer, right, limited to breakdown of skin (*) (Primary) 2. Amputation of left great toe 3. Idiopathic progressive neuropathy 4. Acquired hallux limitus of right foot   Assessment & Plan Consider MIS Sonda this winter if ongoing wound issues with right hallux 1. Toe ulcer: - Healing well, no signs of drainage or infection - Apply urea cream to the affected area twice daily - Avoid hyperextending big toe for approximately one month   - Includes avoiding barefoot walking and stepping off the big toe - Use OrthoFeet men's slippers for comfort and support   - Especially on hardwood floors to prevent slipping - No further intervention necessary if toe remains in good condition - Appropriate measures will be taken if complications arise  Follow-up: 03/2024   Follow up in about 3 months (around 03/26/2024) for cancel 8/27. , At-risk foot care. Risks, benefits, and alternatives of the medications and treatment plan prescribed today were discussed, and patient expressed understanding. Plan follow-up as discussed or as needed if any worsening symptoms or change in condition.    I have  reviewed the information contained in this note and personally verified its accuracy.  I obtained or reviewed the history of present illness and personally performed the physical exam, and all in office imagining including xray and or ultrasound with documentation of findings in the above note.  Inga GAILS Dijour, DPM  12/28/2023 8:07 AM   Computer technology was used to create visit note. Consent from the patient/caregiver was obtained prior to its use.

## 2024-01-13 ENCOUNTER — Inpatient Hospital Stay: Attending: Oncology

## 2024-01-13 ENCOUNTER — Inpatient Hospital Stay (HOSPITAL_BASED_OUTPATIENT_CLINIC_OR_DEPARTMENT_OTHER): Admitting: Oncology

## 2024-01-13 VITALS — BP 135/80 | HR 65 | Temp 98.4°F | Resp 18 | Ht 71.0 in | Wt 312.0 lb

## 2024-01-13 DIAGNOSIS — M19071 Primary osteoarthritis, right ankle and foot: Secondary | ICD-10-CM | POA: Insufficient documentation

## 2024-01-13 DIAGNOSIS — L03115 Cellulitis of right lower limb: Secondary | ICD-10-CM

## 2024-01-13 DIAGNOSIS — G8929 Other chronic pain: Secondary | ICD-10-CM | POA: Insufficient documentation

## 2024-01-13 DIAGNOSIS — Z86711 Personal history of pulmonary embolism: Secondary | ICD-10-CM | POA: Diagnosis present

## 2024-01-13 DIAGNOSIS — Z9081 Acquired absence of spleen: Secondary | ICD-10-CM | POA: Diagnosis not present

## 2024-01-13 DIAGNOSIS — Z86718 Personal history of other venous thrombosis and embolism: Secondary | ICD-10-CM

## 2024-01-13 DIAGNOSIS — Z8572 Personal history of non-Hodgkin lymphomas: Secondary | ICD-10-CM

## 2024-01-13 DIAGNOSIS — Z7901 Long term (current) use of anticoagulants: Secondary | ICD-10-CM | POA: Diagnosis not present

## 2024-01-13 DIAGNOSIS — Z87891 Personal history of nicotine dependence: Secondary | ICD-10-CM | POA: Insufficient documentation

## 2024-01-13 DIAGNOSIS — R634 Abnormal weight loss: Secondary | ICD-10-CM | POA: Insufficient documentation

## 2024-01-13 LAB — CBC WITH DIFFERENTIAL (CANCER CENTER ONLY)
Abs Immature Granulocytes: 0.03 K/uL (ref 0.00–0.07)
Basophils Absolute: 0.1 K/uL (ref 0.0–0.1)
Basophils Relative: 1 %
Eosinophils Absolute: 0.5 K/uL (ref 0.0–0.5)
Eosinophils Relative: 6 %
HCT: 45.1 % (ref 39.0–52.0)
Hemoglobin: 15.5 g/dL (ref 13.0–17.0)
Immature Granulocytes: 0 %
Lymphocytes Relative: 14 %
Lymphs Abs: 1.1 K/uL (ref 0.7–4.0)
MCH: 29.7 pg (ref 26.0–34.0)
MCHC: 34.4 g/dL (ref 30.0–36.0)
MCV: 86.4 fL (ref 80.0–100.0)
Monocytes Absolute: 1.4 K/uL — ABNORMAL HIGH (ref 0.1–1.0)
Monocytes Relative: 18 %
Neutro Abs: 4.7 K/uL (ref 1.7–7.7)
Neutrophils Relative %: 61 %
Platelet Count: 288 K/uL (ref 150–400)
RBC: 5.22 MIL/uL (ref 4.22–5.81)
RDW: 16.6 % — ABNORMAL HIGH (ref 11.5–15.5)
WBC Count: 7.8 K/uL (ref 4.0–10.5)
nRBC: 0 % (ref 0.0–0.2)

## 2024-01-13 LAB — CMP (CANCER CENTER ONLY)
ALT: 17 U/L (ref 0–44)
AST: 17 U/L (ref 15–41)
Albumin: 4.3 g/dL (ref 3.5–5.0)
Alkaline Phosphatase: 61 U/L (ref 38–126)
Anion gap: 6 (ref 5–15)
BUN: 20 mg/dL (ref 8–23)
CO2: 29 mmol/L (ref 22–32)
Calcium: 9.4 mg/dL (ref 8.9–10.3)
Chloride: 104 mmol/L (ref 98–111)
Creatinine: 0.79 mg/dL (ref 0.61–1.24)
GFR, Estimated: >60 mL/min (ref >60)
Glucose, Bld: 96 mg/dL (ref 70–99)
Potassium: 4.7 mmol/L (ref 3.5–5.1)
Sodium: 139 mmol/L (ref 135–145)
Total Bilirubin: 0.5 mg/dL (ref 0.0–1.2)
Total Protein: 7.1 g/dL (ref 6.5–8.1)

## 2024-01-13 LAB — D-DIMER, QUANTITATIVE: D-Dimer, Quant: 0.7 ug{FEU}/mL — ABNORMAL HIGH (ref 0.00–0.50)

## 2024-01-13 LAB — LACTATE DEHYDROGENASE: LDH: 118 U/L (ref 98–192)
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
## 2024-01-13 NOTE — Assessment & Plan Note (Addendum)
 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 Currently on Xarelto . Concerns about bleeding risk with anticoagulation, especially with increased fall risk due to age. Discussion about reducing Xarelto  dose to prophylactic dosing to balance clot prevention and bleeding risk. Consideration of NSAIDs for arthritis pain management with reduced Xarelto  dose. IVC filter placed in 2001, likely with scar tissue formation, making removal risky. IVC filter increases risk of downstream clots, necessitating indefinite anticoagulation.  On his consultation with us  on 10/14/2023, we pursued thrombophilia workup.  Prothrombin gene mutation, factor V Leiden mutation were negative.  Protein C activity, protein S activity, Antithrombin III  activity were all within normal limits.  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 Abnormal lupus anticoagulant may be influenced by Xarelto . APS increases thrombotic risk, including venous thromboembolism and potential cardiac complications. Continuing Xarelto  mitigates clotting risk, with discontinuation increasing thrombotic and myocardial infarction risk. Shared decision-making included continuing Xarelto  and introducing Celebrex  every other day for pain, with bleeding monitoring.  D-dimer improved from 1.53 to 0.7, indicating better clotting control. No bleeding issues reported. Routine family screening not indicated unless clot develops.  We did repeat anticardiolipin antibodies and beta-2  glycoprotein antibody testing today.  Will follow-up on the results.  He was advised to continue Xarelto  20 mg daily.  - Advise staying hydrated and monitoring for stomach distress.

## 2024-01-13 NOTE — Assessment & Plan Note (Addendum)
 Chronic arthritis pain managed with Tylenol . Discussion about using NSAIDs for better pain control with reduced Xarelto  dose. Tramadol  found ineffective. Celebrex  considered as a stronger NSAID option. - Consider Celebrex  for arthritis pain management with reduced Xarelto  dose. - Monitor for signs of bleeding with NSAID use. -Candidate for ankle replacement surgery but delaying until weight loss goals achieved.

## 2024-01-13 NOTE — Assessment & Plan Note (Signed)
 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 No recurrence post-surgery with regular surveillance and beta globulin tests showing static results. - Check beta 2 microglobulin level today.

## 2024-01-13 NOTE — Assessment & Plan Note (Addendum)
 Chronic cellulitis under control with long-term Keflex  therapy. Compression socks (15-20 mmHg) used to manage swelling. - Continue Keflex  long-term - Continue wearing compression socks (15-20 mmHg)

## 2024-01-14 LAB — CARDIOLIPIN ANTIBODIES, IGG, IGM, IGA
Anticardiolipin IgA: <9 U/mL (ref 0–11)
Anticardiolipin IgG: 25 GPL U/mL — ABNORMAL HIGH (ref 0–14)
Anticardiolipin IgM: 113 [MPL'U]/mL — ABNORMAL HIGH (ref 0–12)

## 2024-01-15 LAB — BETA-2-GLYCOPROTEIN I ABS, IGG/M/A
Beta-2 Glyco I IgG: 21 GPI IgG units — ABNORMAL HIGH (ref 0–20)
Beta-2-Glycoprotein I IgA: <9 GPI IgA units (ref 0–25)
Beta-2-Glycoprotein I IgM: 112 GPI IgM units — ABNORMAL HIGH (ref 0–32)

## 2024-01-25 ENCOUNTER — Encounter: Payer: Self-pay | Admitting: Oncology

## 2024-01-26 ENCOUNTER — Ambulatory Visit (INDEPENDENT_AMBULATORY_CARE_PROVIDER_SITE_OTHER): Admitting: Sports Medicine

## 2024-01-26 ENCOUNTER — Encounter: Payer: Self-pay | Admitting: Sports Medicine

## 2024-01-26 ENCOUNTER — Other Ambulatory Visit: Payer: Self-pay

## 2024-01-26 DIAGNOSIS — M75101 Unspecified rotator cuff tear or rupture of right shoulder, not specified as traumatic: Secondary | ICD-10-CM | POA: Diagnosis not present

## 2024-01-26 DIAGNOSIS — M19072 Primary osteoarthritis, left ankle and foot: Secondary | ICD-10-CM | POA: Diagnosis not present

## 2024-01-26 DIAGNOSIS — G8929 Other chronic pain: Secondary | ICD-10-CM

## 2024-01-26 DIAGNOSIS — M25511 Pain in right shoulder: Secondary | ICD-10-CM | POA: Diagnosis not present

## 2024-01-26 DIAGNOSIS — M25572 Pain in left ankle and joints of left foot: Secondary | ICD-10-CM | POA: Diagnosis not present

## 2024-01-26 MED ORDER — LIDOCAINE HCL 1 % IJ SOLN
1.0000 mL | INTRAMUSCULAR | Status: AC | PRN
  Administered 2024-01-26: 1 mL

## 2024-01-26 MED ORDER — METHYLPREDNISOLONE ACETATE 40 MG/ML IJ SUSP
40.0000 mg | INTRAMUSCULAR | Status: AC | PRN
  Administered 2024-01-26: 40 mg via INTRA_ARTICULAR

## 2024-01-26 MED ORDER — BUPIVACAINE HCL 0.25 % IJ SOLN
2.0000 mL | INTRAMUSCULAR | Status: AC | PRN
  Administered 2024-01-26: 2 mL via INTRA_ARTICULAR

## 2024-01-26 NOTE — Progress Notes (Signed)
 Patient says that his shoulder has been doing very well. He has been able to swim, and adjusts his exercise for the days that he takes Celebrex , which has been working well for him.  Patient is here today for his left foot. He had his left big toe amputated and has arthritis in the left foot, which he has been getting injections for every 3 months. He would like to try the injections with the ultrasound.

## 2024-01-26 NOTE — Progress Notes (Signed)
 Marc Mills - 78 y.o. male MRN [id number]  Date of birth: 1946-05-04  Office Visit Note: Visit Date: 01/26/2024 PCP: [name], MD Referred by: [name], MD  Subjective: Chief Complaint  Patient presents with   Left Foot - Pain   HPI: Marc Mills is a pleasant 78 y.o. male who presents today for evaluation of left foot pain, f/u right shoulder.  Left foot -Salvator is dealing with midfoot pain more so on the lateral side.  He has been seeing podiatry in the past who has been performing landmark based injections, but this has not been very helpful for him.  His last was about 3 months ago.  He is interested in considering injection under ultrasound guidance to see if this is more efficacious. His pain ensued after having had a ray amputation of the great toe. Does use compression socks.  Right shoulder -the right shoulder continues to do very well.  He is no longer having any consistent pain.  He has been able to continue swimming, up to 1/2 mile with his swims.  He has been doing freestyle activity and modifying certain provocative motions and is quite pleased with how this has done.  Last ultrasound-guided injection into the subacromial space on 12/23/2023, still finding good benefit.   He is using Celebrex  100 mg on an every other day basis which he has cleared with his primary physician and this helped significantly for his shoulder and overall joint pain.  Pertinent ROS were reviewed with the patient and found to be negative unless otherwise specified above in HPI.   Assessment & Plan: Visit Diagnoses:  1. Arthritis of left midfoot   2. Pain in left foot   3. Chronic right shoulder pain   4. Tear of right supraspinatus tendon    Plan: Impression is chronic left foot pain with midfoot arthritic change and transfer metatarsalgia from previous ray amputation with current exacerbation.  He has trialed landmark based injections with podiatry in the past which  were not significantly helpful, through shared decision making did proceed with ultrasound-guided injection into the cuboid-cuneiform-navicular junction, patient tolerated well.  Will continue good supportive shoes and orthotics as indicated.  He does have a notable tear of the supraspinatus > infraspinatus of the right shoulder which has responded very well to previous ultrasound-guided shoulder injections as well as HEP and activity modification.  He will continue this as well as his swimming as able.  He has found good benefit from alternating Celebrex  100 mg every other day, which has been cleared from his primary physician in the setting of Xarelto  use.  He will send me a message in a few weeks to update how he is doing.  Meds & Orders: No orders of the defined types were placed in this encounter.   Orders Placed This Encounter  Procedures   Medium Joint Inj   US  Guided Needle Placement - No Linked Charges     Procedures: Medium Joint Inj: L ankle on 01/26/2024 9:20 AM Indications: pain and diagnostic evaluation Details: 22 G 1.5 in needle, ultrasound-guided anterior approach Medications: 1 mL lidocaine  1 %; 2 mL bupivacaine  0.25 %; 40 mg methylPREDNISolone  acetate 40 MG/ML  *Procedurally successful left foot/ankle joint injection, calcaneal-cuboid-navicular junction Procedure, treatment alternatives, risks and benefits explained, specific risks discussed. Consent was given by the patient. Patient was prepped and draped in the usual sterile fashion.            Clinical History: No specialty comments  available.  He reports that he quit smoking about 39 years ago. His smoking use included cigarettes. He started smoking about 59 years ago. He has a 40 pack-year smoking history. He has never used smokeless tobacco. No results for input(s): HGBA1C, LABURIC in the last 8760 hours.  Objective:    Physical Exam  Gen: Well-appearing, in no acute distress; non-toxic CV: Well-perfused.  Warm.  Resp: Breathing unlabored on room air; no wheezing. Psych: Fluid speech in conversation; appropriate affect; normal thought process  Ortho Exam - Left foot: Status post ray amputation of the first great toe at the level of the MTP joint.  There is a degree of transfer metatarsalgia upon standing.  There is pain with palpation over the lateral aspect of the midfoot near the cuboid-cuneiform-navicular junction.  There is trace pedal edema bilaterally.  No effusion about the ankle joint.  Imaging:  12/03/23: 3 views of the left foot including AP, oblique and lateral film was  ordered and reviewed by myself today.  X-rays demonstrate previous ray  amputation of the distal great toe IP joint.  There is at least moderate  midfoot arthritic change most notable at the talonavicular and navicular  cuboid juncture with anterior spurring.  There is reciprocal medial  translation of the toes 2-5 likely from compensation from his previous  great toe resection.  No acute fracture noted.   Past Medical/Family/Surgical/Social History: Medications & Allergies reviewed per EMR, new medications updated. Patient Active Problem List   Diagnosis Date Noted   Trochanteric bursitis, left hip 06/19/2023   Arthritis of right ankle 02/04/2023   Post-traumatic osteoarthritis, right ankle and foot 02/18/2022   Toe pain, right 02/18/2022   Essential hypertension 08/28/2021   Low back pain 06/27/2021   Acute embolism and thrombosis of unspecified deep veins of unspecified lower extremity (HCC) 06/03/2021   Anal fistula 06/03/2021   Anxiety 06/03/2021   Erectile dysfunction 06/03/2021   Essential tremor 06/03/2021   History of lymphoma 06/03/2021   Impacted cerumen 06/03/2021   Peripheral neuropathy 06/03/2021   History of colonic polyps 06/03/2021   Rectal fistula 06/03/2021   Inflamed seborrheic keratosis 06/03/2021   Vitamin D  deficiency 06/03/2021   Tremor 06/03/2021   Eustachian tube dysfunction,  left 12/10/2020   Otalgia, left 12/10/2020   Nondisplaced fracture of distal phalanx of left middle finger, initial encounter for open fracture 09/18/2020   Pain in left hip 03/27/2020   Laceration of right ring finger 05/05/2019   Laceration of flexor muscle, fascia and tendon of right ring finger at wrist and hand level, initial encounter 04/23/2019   Unspecified fracture of the lower end of right radius, initial encounter for closed fracture 04/23/2019   Fracture of unspecified phalanx of right ring finger, initial encounter for open fracture 04/23/2019   Left knee pain 06/14/2018   Pain in left ankle and joints of left foot 12/18/2017   Bilateral edema of lower extremity 10/30/2015   Onychomycosis 10/30/2015   History of pulmonary embolism    History of DVT (deep vein thrombosis)    Pyrexia    Cellulitis 03/18/2015   Chronic anticoagulation 03/17/2015   Morbid obesity with BMI of 45.0-49.9, adult (HCC) 03/17/2015   S/P IVC filter 03/17/2015   Genetic testing 01/12/2015   Postoperative wound infection 12/09/2013   Postop check 11/24/2013   Ulcer of lower limb (HCC) 08/24/2013   Inguinal lymphadenopathy 08/16/2013   Encounter for therapeutic drug monitoring 06/09/2013   Left leg cellulitis 04/04/2013   Osteoarthritis of  left knee 11/02/2012   Cellulitis of right lower extremity 04/30/2012   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  04/29/2012   S/P splenectomy 04/29/2012   Incisional hernia 05/27/2011   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   Morbid obesity (HCC) 04/19/2008   Obstructive sleep apnea 04/19/2008   VARICOSE VEINS, LOWER EXTREMITIES 04/19/2008   PULMONARY EMBOLISM, HX OF 04/19/2008   Past Medical History:  Diagnosis Date   Anorectal fistula    Arthritis    Blood transfusion without reported diagnosis    Cancer (HCC)    lymphoma hx   Cataract    Cellulitis 04/04/2013   DVT (deep venous thrombosis) (HCC)    2011 right leg   History of lymphoma     History of pulmonary embolus (PE)    Incisional hernia    Morbid obesity (HCC)    OSA on CPAP    cpap  10 yrs   Sepsis (HCC) 08/31/2015   Varicose veins of lower extremities    pe   Family History  Problem Relation Age of Onset   Heart disease Father    Varicose Veins Father    Heart attack Father        dx. 87s   Heart disease Mother 62   Varicose Veins Mother    Pancreatic cancer Sister 76   Cancer Maternal Grandmother        either stomach or pancreatic primary    Pancreatic cancer Maternal Grandfather        dx. 50s   Lung cancer Maternal Aunt        secondhand smoke exposure   Heart attack Paternal Uncle    Rectal cancer Maternal Aunt        dx. 80s   Cancer Maternal Aunt        unknown type   Diabetes Paternal Uncle    Cancer Cousin        unknown type   Breast cancer Cousin        dx. 58s   Past Surgical History:  Procedure Laterality Date   EYE SURGERY Left 03   cat, detached ret     FRACTURE SURGERY     HERNIA REPAIR     INGUINAL LYMPH NODE BIOPSY [name] 11/15/2013   Procedure: RIGHT INGUINAL LYMPH NODE BIOPSY;  Surgeon: Lynwood MALVA Pina, MD;  Location: [facility] OR;  Service: General;  Laterality: [name];   ivc filter     PROSTATE SURGERY     (patient denies)   SPLENECTOMY     TONSILLECTOMY     age 40   TOTAL KNEE ARTHROPLASTY Left 11/02/2012   Procedure: TOTAL KNEE ARTHROPLASTY with revision tibia;  Surgeon: Maude LELON Right, MD;  Location: [facility] OR;  Service: Orthopedics;  Laterality: Left;   Social History   Occupational History   Not on file  Tobacco Use   Smoking status: Former    Current packs/day: 0.00    Average packs/day: 2.0 packs/day for 20.0 years (40.0 ttl pk-yrs)    Types: Cigarettes    Start date: 05/05/1964    Quit date: 05/05/1984    Years since quitting: 39.7   Smokeless tobacco: Never  Vaping Use   Vaping status: Never Used  Substance and Sexual Activity   Alcohol use: Yes    Alcohol/week: 0.0 standard drinks of alcohol    Comment: maybe 1  beer/month   Drug use: No   Sexual activity: Yes    Birth control/protection: None

## 2024-01-31 ENCOUNTER — Encounter: Payer: Self-pay | Admitting: Sports Medicine

## 2024-02-02 ENCOUNTER — Other Ambulatory Visit: Payer: Self-pay | Admitting: Sports Medicine

## 2024-02-02 MED ORDER — PREDNISONE 20 MG PO TABS: 20.0000 mg | ORAL_TABLET | Freq: Every day | ORAL | 0 refills | Status: AC

## 2024-02-24 ENCOUNTER — Other Ambulatory Visit: Payer: Self-pay | Admitting: Oncology

## 2024-03-07 ENCOUNTER — Encounter: Payer: Self-pay | Admitting: Radiology

## 2024-04-16 ENCOUNTER — Other Ambulatory Visit: Payer: Self-pay | Admitting: Oncology

## 2024-04-20 ENCOUNTER — Encounter: Payer: Self-pay | Admitting: Oncology

## 2024-07-12 ENCOUNTER — Other Ambulatory Visit

## 2024-07-12 ENCOUNTER — Ambulatory Visit: Admitting: Oncology
